# Patient Record
Sex: Male | Born: 1935 | Race: White | Hispanic: No | Smoking: Former smoker
Health system: Southern US, Community
[De-identification: ages and names within clinical notes are randomized; demographics above are authoritative.]

## PROBLEM LIST (undated history)

## (undated) DIAGNOSIS — M1990 Unspecified osteoarthritis, unspecified site: Secondary | ICD-10-CM

## (undated) DIAGNOSIS — F329 Major depressive disorder, single episode, unspecified: Secondary | ICD-10-CM

## (undated) DIAGNOSIS — T7840XA Allergy, unspecified, initial encounter: Secondary | ICD-10-CM

## (undated) DIAGNOSIS — N200 Calculus of kidney: Secondary | ICD-10-CM

## (undated) DIAGNOSIS — F419 Anxiety disorder, unspecified: Secondary | ICD-10-CM

## (undated) DIAGNOSIS — IMO0001 Reserved for inherently not codable concepts without codable children: Secondary | ICD-10-CM

## (undated) DIAGNOSIS — K219 Gastro-esophageal reflux disease without esophagitis: Secondary | ICD-10-CM

## (undated) DIAGNOSIS — F32A Depression, unspecified: Secondary | ICD-10-CM

## (undated) DIAGNOSIS — R42 Dizziness and giddiness: Secondary | ICD-10-CM

## (undated) HISTORY — DX: Allergy, unspecified, initial encounter: T78.40XA

## (undated) HISTORY — DX: Gastro-esophageal reflux disease without esophagitis: K21.9

## (undated) HISTORY — DX: Reserved for inherently not codable concepts without codable children: IMO0001

## (undated) HISTORY — DX: Depression, unspecified: F32.A

## (undated) HISTORY — PX: HERNIA REPAIR: SHX51

## (undated) HISTORY — DX: Calculus of kidney: N20.0

## (undated) HISTORY — DX: Major depressive disorder, single episode, unspecified: F32.9

## (undated) HISTORY — DX: Anxiety disorder, unspecified: F41.9

## (undated) HISTORY — DX: Unspecified osteoarthritis, unspecified site: M19.90

## (undated) HISTORY — PX: MASTOIDECTOMY: SHX711

---

## 2007-11-27 ENCOUNTER — Encounter: Admission: RE | Admit: 2007-11-27 | Discharge: 2007-11-27 | Payer: Self-pay | Admitting: Family Medicine

## 2008-01-09 ENCOUNTER — Encounter: Payer: Self-pay | Admitting: Family Medicine

## 2008-05-27 ENCOUNTER — Ambulatory Visit: Payer: Self-pay | Admitting: Family Medicine

## 2008-05-27 DIAGNOSIS — K219 Gastro-esophageal reflux disease without esophagitis: Secondary | ICD-10-CM | POA: Insufficient documentation

## 2008-05-27 DIAGNOSIS — Z87442 Personal history of urinary calculi: Secondary | ICD-10-CM | POA: Insufficient documentation

## 2008-05-27 DIAGNOSIS — J019 Acute sinusitis, unspecified: Secondary | ICD-10-CM

## 2008-05-27 DIAGNOSIS — F329 Major depressive disorder, single episode, unspecified: Secondary | ICD-10-CM

## 2008-05-27 DIAGNOSIS — M1990 Unspecified osteoarthritis, unspecified site: Secondary | ICD-10-CM | POA: Insufficient documentation

## 2008-05-27 DIAGNOSIS — F411 Generalized anxiety disorder: Secondary | ICD-10-CM

## 2008-06-01 ENCOUNTER — Ambulatory Visit: Payer: Self-pay | Admitting: Family Medicine

## 2008-06-24 ENCOUNTER — Telehealth: Payer: Self-pay | Admitting: Family Medicine

## 2009-01-05 ENCOUNTER — Ambulatory Visit: Payer: Self-pay | Admitting: Family Medicine

## 2009-01-17 ENCOUNTER — Telehealth: Payer: Self-pay | Admitting: Family Medicine

## 2009-02-16 ENCOUNTER — Ambulatory Visit: Payer: Self-pay | Admitting: Family Medicine

## 2009-02-16 DIAGNOSIS — N401 Enlarged prostate with lower urinary tract symptoms: Secondary | ICD-10-CM

## 2009-02-16 DIAGNOSIS — N138 Other obstructive and reflux uropathy: Secondary | ICD-10-CM

## 2009-02-16 DIAGNOSIS — R030 Elevated blood-pressure reading, without diagnosis of hypertension: Secondary | ICD-10-CM | POA: Insufficient documentation

## 2009-02-16 DIAGNOSIS — L299 Pruritus, unspecified: Secondary | ICD-10-CM

## 2009-02-16 DIAGNOSIS — R5383 Other fatigue: Secondary | ICD-10-CM

## 2009-02-16 DIAGNOSIS — R5381 Other malaise: Secondary | ICD-10-CM | POA: Insufficient documentation

## 2009-02-18 LAB — CONVERTED CEMR LAB
ALT: 22 units/L (ref 0–53)
AST: 28 units/L (ref 0–37)
BUN: 17 mg/dL (ref 6–23)
Basophils Relative: 0.8 % (ref 0.0–3.0)
Bilirubin, Direct: 0.3 mg/dL (ref 0.0–0.3)
Chloride: 106 meq/L (ref 96–112)
Eosinophils Relative: 2.7 % (ref 0.0–5.0)
GFR calc non Af Amer: 60.86 mL/min (ref >60)
HCT: 44.3 % (ref 39.0–52.0)
HDL: 29.5 mg/dL — ABNORMAL LOW (ref >39.00)
Lymphs Abs: 1.6 10*3/uL (ref 0.7–4.0)
MCV: 97.9 fL (ref 78.0–100.0)
Monocytes Absolute: 0.5 10*3/uL (ref 0.1–1.0)
Monocytes Relative: 9.8 % (ref 3.0–12.0)
Platelets: 169 10*3/uL (ref 150.0–400.0)
Potassium: 4.9 meq/L (ref 3.5–5.1)
RBC: 4.52 M/uL (ref 4.22–5.81)
Sodium: 140 meq/L (ref 135–145)
TSH: 2.7 microintl units/mL (ref 0.35–5.50)
Total Bilirubin: 1.3 mg/dL — ABNORMAL HIGH (ref 0.3–1.2)
Total CHOL/HDL Ratio: 8
Total Protein: 7.7 g/dL (ref 6.0–8.3)
VLDL: 28.4 mg/dL (ref 0.0–40.0)
WBC: 5 10*3/uL (ref 4.5–10.5)

## 2009-07-04 ENCOUNTER — Ambulatory Visit: Payer: Self-pay | Admitting: Family Medicine

## 2009-07-04 DIAGNOSIS — H60399 Other infective otitis externa, unspecified ear: Secondary | ICD-10-CM | POA: Insufficient documentation

## 2009-07-12 ENCOUNTER — Telehealth: Payer: Self-pay | Admitting: Family Medicine

## 2009-10-31 ENCOUNTER — Ambulatory Visit: Payer: Self-pay | Admitting: Family Medicine

## 2009-10-31 DIAGNOSIS — R404 Transient alteration of awareness: Secondary | ICD-10-CM | POA: Insufficient documentation

## 2009-11-03 ENCOUNTER — Encounter: Payer: Self-pay | Admitting: Family Medicine

## 2009-11-10 ENCOUNTER — Encounter: Payer: Self-pay | Admitting: Family Medicine

## 2010-01-31 ENCOUNTER — Encounter: Payer: Self-pay | Admitting: Family Medicine

## 2010-05-09 NOTE — Assessment & Plan Note (Signed)
Summary: swimmer's ear//ccm   Vital Signs:  Patient profile:   77 year old male Weight:      162 pounds BP sitting:   108 / 60  (left arm) Cuff size:   regular  Vitals Entered By: Raechel Ache, RN (October 31, 2009 3:44 PM) CC: C/o fluid R ear   History of Present Illness: Here for 2 reasons. First for 2 days he has had irritation in the right ear canal again. No other URI symptoms. he cleans out his ears periodically with OTC ear wax removal kits. Also for the past 6 months he has had increasing problems with falling asleep too easily during the day. This often interrupts his attempts to read the paper or watch TV. It never occurs while driving. he sleeps well at night .   Allergies: No Known Drug Allergies  Past History:  Past Medical History: Reviewed history from 02/16/2009 and no changes required. Nephrolithiasis, hx of Osteoarthritis Chickenpox GERD Anxiety Depression Allergic rhinitis halux valgus deformity right great toe, sees Dr. Lajoyce Corners right inguinal hernia, wears a truss wears hearing aids sees Dr. Donzetta Starch for skin exams  Review of Systems  The patient denies anorexia, fever, weight loss, weight gain, vision loss, decreased hearing, hoarseness, chest pain, syncope, dyspnea on exertion, peripheral edema, prolonged cough, headaches, hemoptysis, abdominal pain, melena, hematochezia, severe indigestion/heartburn, hematuria, incontinence, genital sores, muscle weakness, suspicious skin lesions, transient blindness, difficulty walking, depression, unusual weight change, abnormal bleeding, enlarged lymph nodes, angioedema, breast masses, and testicular masses.    Physical Exam  General:  Well-developed,well-nourished,in no acute distress; alert,appropriate and cooperative throughout examination Head:  Normocephalic and atraumatic without obvious abnormalities. No apparent alopecia or balding. Eyes:  No corneal or conjunctival inflammation noted. EOMI. Perrla.  Funduscopic exam benign, without hemorrhages, exudates or papilledema. Vision grossly normal. Ears:  External ear exam shows no significant lesions or deformities.  Otoscopic examination reveals clear canals, tympanic membranes are intact bilaterally without bulging, retraction, inflammation or discharge. Hearing is grossly normal bilaterally. Nose:  External nasal examination shows no deformity or inflammation. Nasal mucosa are pink and moist without lesions or exudates. Mouth:  Oral mucosa and oropharynx without lesions or exudates.  Teeth in good repair. Neck:  No deformities, masses, or tenderness noted. Lungs:  Normal respiratory effort, chest expands symmetrically. Lungs are clear to auscultation, no crackles or wheezes. Heart:  Normal rate and regular rhythm. S1 and S2 normal without gallop, murmur, click, rub or other extra sounds. Neurologic:  alert & oriented X3, cranial nerves II-XII intact, strength normal in all extremities, and gait normal.   Skin:  Intact without suspicious lesions or rashes Psych:  Cognition and judgment appear intact. Alert and cooperative with normal attention span and concentration. No apparent delusions, illusions, hallucinations   Impression & Recommendations:  Problem # 1:  SOMNOLENCE (ICD-780.09)  Problem # 2:  OTITIS EXTERNA (ICD-380.10)  His updated medication list for this problem includes:    Cortisporin-tc 3.06-09-08-0.5 Mg/ml Susp (Neomycin-colist-hc-thonzonium) .Marland Kitchen... Apply 5 drops in ear q 6 hours as needed  Complete Medication List: 1)  Lorazepam 1mg  Mg Tabs (lorazepam)  .... Qhs as needed 2)  Astelin 137 Mcg/spray Soln (Azelastine hcl) .Marland Kitchen.. 1 spray each nostril twice daily 3)  Prilosec Otc 20 Mg Tbec (Omeprazole magnesium) .... One by mouth daily 4)  Celexa 20 Mg Tabs (Citalopram hydrobromide) .... Once daily 5)  Flonase 50 Mcg/act Susp (Fluticasone propionate) .... 2 sprays each nostril once daily as needed 6)  Cortisporin-tc 3.06-09-08-0.5  Mg/ml Susp (Neomycin-colist-hc-thonzonium) .... Apply 5 drops in ear q 6 hours as needed 7)  Provigil 100 Mg Tabs (Modafinil) .... Once daily  Patient Instructions: 1)  Try Provigil 100 mg each morning.  2)  Please schedule a follow-up appointment in 2 weeks.  Prescriptions: PROVIGIL 100 MG TABS (MODAFINIL) once daily  #30 x 0   Entered and Authorized by:   Nelwyn Salisbury MD   Signed by:   Nelwyn Salisbury MD on 10/31/2009   Method used:   Print then Give to Patient   RxID:   [id number] CORTISPORIN-TC 3.06-09-08-0.5 MG/ML SUSP (NEOMYCIN-COLIST-HC-THONZONIUM) apply 5 drops in ear q 6 hours as needed  #10 x 5   Entered and Authorized by:   Nelwyn Salisbury MD   Signed by:   Nelwyn Salisbury MD on 10/31/2009   Method used:   Electronically to        [facility]* (retail)       [address]       [place], [place]  [id number]       Ph: [phone]       Fax: [phone]   RxID:   [id number]

## 2010-05-09 NOTE — Consult Note (Signed)
Summary: [facility]  [facility]   Imported By: Maryln Gottron 02/02/2010 14:00:50  _____________________________________________________________________  External Attachment:    Type:   Image     Comment:   External Document

## 2010-05-09 NOTE — Progress Notes (Signed)
Summary: refill lorazepam  Phone Note From Pharmacy   Caller: [facility]* Call For: refill  Summary of Call: request refill for Lorazepam 1mg  qhs Initial call taken by: Duard Brady LPN,  July 12, 2009 8:52 AM  Follow-up for Phone Call        call in #30 with 5 rf Follow-up by: Nelwyn Salisbury MD,  July 12, 2009 10:48 AM    Prescriptions: LORAZEPAM 0.5 MG TABS (LORAZEPAM) as needed  #30 x 5   Entered by:   Duard Brady LPN   Authorized by:   Nelwyn Salisbury MD   Signed by:   Duard Brady LPN on 09/81/1914   Method used:   Historical   RxID:   [id number]  called to [facility] >[facility]  Appended Document: refill lorazepam returned call to [place] [facility] in [place] , lorazepam has been 1mg  qhsprn - ok to continue.  Appended Document: refill lorazepam    Clinical Lists Changes  Medications: Changed medication from LORAZEPAM 0.5 MG TABS (LORAZEPAM) as needed to * LORAZEPAM 1MG  MG TABS (LORAZEPAM) qhs as needed

## 2010-05-09 NOTE — Medication Information (Signed)
Summary: 2nd part of PA denied for Provigil  2nd part of PA denied for Provigil   Imported By: Maryln Gottron 11/14/2009 10:44:10  _____________________________________________________________________  External Attachment:    Type:   Image     Comment:   External Document

## 2010-05-09 NOTE — Assessment & Plan Note (Signed)
Summary: EAR DRAINAGE / PAIN  // RS   Vital Signs:  Patient profile:   77 year old male Weight:      162 pounds Temp:     97.8 degrees F oral BP sitting:   120 / 80  (left arm) Cuff size:   regular  Vitals Entered By: Raechel Ache, RN (July 04, 2009 1:28 PM) CC: C/o R ear draining x 1 week- little sore.   History of Present Illness: Here to look at his right ear which has been slightly painful and draining yellow fluid for one week. This started after he attempted to irrigate cerumen out of this ear. He has done this at home for some time, usually with success. He does wear bilateral aids.   Allergies (verified): No Known Drug Allergies  Past History:  Past Medical History: Reviewed history from 02/16/2009 and no changes required. Nephrolithiasis, hx of Osteoarthritis Chickenpox GERD Anxiety Depression Allergic rhinitis halux valgus deformity right great toe, sees Dr. Lajoyce Corners right inguinal hernia, wears a truss wears hearing aids sees Dr. Donzetta Starch for skin exams  Review of Systems  The patient denies anorexia, fever, weight loss, weight gain, vision loss, hoarseness, chest pain, syncope, dyspnea on exertion, peripheral edema, prolonged cough, headaches, hemoptysis, abdominal pain, melena, hematochezia, severe indigestion/heartburn, hematuria, incontinence, genital sores, muscle weakness, suspicious skin lesions, transient blindness, difficulty walking, depression, unusual weight change, abnormal bleeding, enlarged lymph nodes, angioedema, breast masses, and testicular masses.    Physical Exam  General:  Well-developed,well-nourished,in no acute distress; alert,appropriate and cooperative throughout examination Head:  Normocephalic and atraumatic without obvious abnormalities. No apparent alopecia or balding. Eyes:  No corneal or conjunctival inflammation noted. EOMI. Perrla. Funduscopic exam benign, without hemorrhages, exudates or papilledema. Vision grossly  normal. Ears:  the right canal is red and inflamed, yellow drainage is present. The canal is narrow and tortuous, so inspecting the TM is impossible.  Nose:  External nasal examination shows no deformity or inflammation. Nasal mucosa are pink and moist without lesions or exudates. Mouth:  Oral mucosa and oropharynx without lesions or exudates.  Teeth in good repair. Neck:  No deformities, masses, or tenderness noted.   Impression & Recommendations:  Problem # 1:  OTITIS EXTERNA (ICD-380.10)  His updated medication list for this problem includes:    Cortisporin-tc 3.06-09-08-0.5 Mg/ml Susp (Neomycin-colist-hc-thonzonium) .Marland Kitchen... Apply 5 drops in ear q 6 hours as needed  Complete Medication List: 1)  Lorazepam 0.5 Mg Tabs (Lorazepam) .... As needed 2)  Astelin 137 Mcg/spray Soln (Azelastine hcl) .Marland Kitchen.. 1 spray each nostril twice daily 3)  Prilosec Otc 20 Mg Tbec (Omeprazole magnesium) .... One by mouth daily 4)  Celexa 20 Mg Tabs (Citalopram hydrobromide) .... Once daily 5)  Flonase 50 Mcg/act Susp (Fluticasone propionate) .... 2 sprays each nostril once daily as needed 6)  Cortisporin-tc 3.06-09-08-0.5 Mg/ml Susp (Neomycin-colist-hc-thonzonium) .... Apply 5 drops in ear q 6 hours as needed  Patient Instructions: 1)  Please schedule a follow-up appointment as needed .  Prescriptions: CORTISPORIN-TC 3.06-09-08-0.5 MG/ML SUSP (NEOMYCIN-COLIST-HC-THONZONIUM) apply 5 drops in ear q 6 hours as needed  #10 x 2   Entered and Authorized by:   Nelwyn Salisbury MD   Signed by:   Nelwyn Salisbury MD on 07/04/2009   Method used:   Electronically to        [facility]* (retail)       [address]       [place], [place]  [id number]  Ph: [phone]       Fax: [phone]   RxID:   [id number]

## 2010-05-09 NOTE — Medication Information (Signed)
Summary: Provigil Approved  Provigil Approved   Imported By: Maryln Gottron 11/09/2009 09:28:10  _____________________________________________________________________  External Attachment:    Type:   Image     Comment:   External Document

## 2010-06-13 ENCOUNTER — Other Ambulatory Visit: Payer: Self-pay | Admitting: Family Medicine

## 2010-06-15 ENCOUNTER — Other Ambulatory Visit: Payer: Self-pay | Admitting: Family Medicine

## 2010-06-16 ENCOUNTER — Other Ambulatory Visit: Payer: Self-pay | Admitting: *Deleted

## 2010-06-16 MED ORDER — FLUTICASONE PROPIONATE 50 MCG/ACT NA SUSP: 2.0000 | Freq: Every day | NASAL | Status: DC

## 2010-06-27 ENCOUNTER — Encounter: Payer: Self-pay | Admitting: Family Medicine

## 2010-07-10 ENCOUNTER — Other Ambulatory Visit: Payer: Self-pay

## 2010-07-10 NOTE — Telephone Encounter (Signed)
rx faxed to [facility] 

## 2010-07-10 NOTE — Telephone Encounter (Signed)
Requesting refills lorazepam

## 2010-07-10 NOTE — Telephone Encounter (Signed)
Call in Lorazepam 1 mg qhs, #30 with 5 rf

## 2010-07-11 MED ORDER — LORAZEPAM 1 MG PO TABS: 1.0000 mg | ORAL_TABLET | Freq: Every evening | ORAL | Status: DC | PRN

## 2010-07-12 ENCOUNTER — Other Ambulatory Visit (INDEPENDENT_AMBULATORY_CARE_PROVIDER_SITE_OTHER): Payer: Medicare Other | Admitting: Family Medicine

## 2010-07-12 ENCOUNTER — Other Ambulatory Visit: Payer: Self-pay | Admitting: Family Medicine

## 2010-07-12 DIAGNOSIS — T887XXA Unspecified adverse effect of drug or medicament, initial encounter: Secondary | ICD-10-CM

## 2010-07-12 DIAGNOSIS — N390 Urinary tract infection, site not specified: Secondary | ICD-10-CM

## 2010-07-12 DIAGNOSIS — Z0000 Encounter for general adult medical examination without abnormal findings: Secondary | ICD-10-CM

## 2010-07-12 DIAGNOSIS — E039 Hypothyroidism, unspecified: Secondary | ICD-10-CM

## 2010-07-12 DIAGNOSIS — N419 Inflammatory disease of prostate, unspecified: Secondary | ICD-10-CM

## 2010-07-12 DIAGNOSIS — E785 Hyperlipidemia, unspecified: Secondary | ICD-10-CM

## 2010-07-12 DIAGNOSIS — I10 Essential (primary) hypertension: Secondary | ICD-10-CM

## 2010-07-12 DIAGNOSIS — D649 Anemia, unspecified: Secondary | ICD-10-CM

## 2010-07-12 LAB — CBC WITH DIFFERENTIAL/PLATELET
Basophils Absolute: 0 10*3/uL (ref 0.0–0.1)
Eosinophils Absolute: 0.1 10*3/uL (ref 0.0–0.7)
HCT: 40.9 % (ref 39.0–52.0)
Lymphs Abs: 1.9 10*3/uL (ref 0.7–4.0)
MCHC: 34.5 g/dL (ref 30.0–36.0)
MCV: 95.9 fl (ref 78.0–100.0)
Monocytes Absolute: 0.5 10*3/uL (ref 0.1–1.0)
Neutrophils Relative %: 53.1 % (ref 43.0–77.0)
Platelets: 176 10*3/uL (ref 150.0–400.0)
RDW: 13.4 % (ref 11.5–14.6)

## 2010-07-12 LAB — BASIC METABOLIC PANEL
BUN: 14 mg/dL (ref 6–23)
Chloride: 109 mEq/L (ref 96–112)
Creatinine, Ser: 1.1 mg/dL (ref 0.4–1.5)
Glucose, Bld: 81 mg/dL (ref 70–99)
Potassium: 4.1 mEq/L (ref 3.5–5.1)

## 2010-07-12 LAB — LIPID PANEL
Cholesterol: 199 mg/dL (ref 0–200)
HDL: 36 mg/dL — ABNORMAL LOW (ref >39.00)
LDL Cholesterol: 143 mg/dL — ABNORMAL HIGH (ref 0–99)
Triglycerides: 98 mg/dL (ref 0.0–149.0)
VLDL: 19.6 mg/dL (ref 0.0–40.0)

## 2010-07-12 LAB — POCT URINALYSIS DIPSTICK
Glucose, UA: NEGATIVE
Spec Grav, UA: 1.025
pH, UA: 5.5

## 2010-07-12 LAB — HEPATIC FUNCTION PANEL
ALT: 16 U/L (ref 0–53)
Total Bilirubin: 0.7 mg/dL (ref 0.3–1.2)

## 2010-07-12 LAB — PSA: PSA: 3.4 ng/mL (ref 0.10–4.00)

## 2010-07-17 ENCOUNTER — Telehealth: Payer: Self-pay

## 2010-07-17 NOTE — Telephone Encounter (Signed)
Pt aware.

## 2010-07-17 NOTE — Telephone Encounter (Signed)
Message copied by Madison Hickman on Mon Jul 17, 2010 11:11 AM ------      Message from: Dwaine Deter      Created: Thu Jul 13, 2010  1:26 PM       Normal except mildly high chol. Watch the diet

## 2010-07-19 ENCOUNTER — Ambulatory Visit: Payer: Self-pay | Admitting: Family Medicine

## 2010-07-24 ENCOUNTER — Encounter: Payer: Self-pay | Admitting: Family Medicine

## 2010-07-24 ENCOUNTER — Ambulatory Visit (INDEPENDENT_AMBULATORY_CARE_PROVIDER_SITE_OTHER): Payer: Medicare Other | Admitting: Family Medicine

## 2010-07-24 VITALS — BP 124/80 | HR 70 | Ht 65.0 in | Wt 162.0 lb

## 2010-07-24 DIAGNOSIS — R5381 Other malaise: Secondary | ICD-10-CM

## 2010-07-24 DIAGNOSIS — R531 Weakness: Secondary | ICD-10-CM

## 2010-07-24 DIAGNOSIS — M129 Arthropathy, unspecified: Secondary | ICD-10-CM

## 2010-07-24 DIAGNOSIS — M1990 Unspecified osteoarthritis, unspecified site: Secondary | ICD-10-CM

## 2010-07-24 DIAGNOSIS — F329 Major depressive disorder, single episode, unspecified: Secondary | ICD-10-CM

## 2010-07-24 DIAGNOSIS — K4090 Unilateral inguinal hernia, without obstruction or gangrene, not specified as recurrent: Secondary | ICD-10-CM

## 2010-07-24 MED ORDER — CITALOPRAM HYDROBROMIDE 20 MG PO TABS: 10.0000 mg | ORAL_TABLET | Freq: Every day | ORAL | Status: DC

## 2010-07-24 NOTE — Progress Notes (Signed)
  Subjective:    Patient ID: Allen Wheeler, male    DOB: 15-Jun-1919, 77 y.o.   MRN: [id number]  HPI 77 yr old male for a cpx. He has a few minor complaints but is doing well all in all. He has had some fasciitis in the left foot and heel, but this is better with stretches and wearing arch supports. He has a persistent right groin hernia, but he wears a truss every day and this works well. He has very little pain with it. He has arthritis in his hands but he can deal with this also. He denies any chest pains or SOB. He is active and works in his garden daily. He does report a lot of sleepiness during the day, and he often has to take naps. He wonders if this is age related or a side effect of his Celexa.    Review of Systems  Constitutional: Negative.   HENT: Negative.   Eyes: Negative.   Respiratory: Negative.   Cardiovascular: Negative.   Gastrointestinal: Negative.   Genitourinary: Negative.   Musculoskeletal: Positive for arthralgias. Negative for myalgias, back pain and gait problem.  Skin: Negative.   Neurological: Negative.   Hematological: Negative.   Psychiatric/Behavioral: Negative.        Objective:   Physical Exam  Constitutional: He is oriented to person, place, and time. He appears well-developed and well-nourished. No distress.  HENT:  Head: Normocephalic and atraumatic.  Right Ear: External ear normal.  Left Ear: External ear normal.  Nose: Nose normal.  Mouth/Throat: Oropharynx is clear and moist. No oropharyngeal exudate.  Eyes: Conjunctivae and EOM are normal. Pupils are equal, round, and reactive to light. Right eye exhibits no discharge. Left eye exhibits no discharge. No scleral icterus.  Neck: Neck supple. No JVD present. No tracheal deviation present. No thyromegaly present.  Cardiovascular: Normal rate, regular rhythm, normal heart sounds and intact distal pulses.  Exam reveals no gallop and no friction rub.   No murmur heard.      EKG normal    Pulmonary/Chest: Effort normal and breath sounds normal. No respiratory distress. He has no wheezes. He has no rales. He exhibits no tenderness.  Abdominal: Soft. Bowel sounds are normal. He exhibits no distension and no mass. There is no tenderness. There is no rebound and no guarding.  Genitourinary: Rectum normal, prostate normal and penis normal. Guaiac negative stool. No penile tenderness.       He has a small reducible non-tender right direct inguinal hernia   Musculoskeletal: Normal range of motion. He exhibits no edema and no tenderness.  Lymphadenopathy:    He has no cervical adenopathy.  Neurological: He is alert and oriented to person, place, and time. He has normal reflexes. No cranial nerve deficit. He exhibits normal muscle tone. Coordination normal.  Skin: Skin is warm and dry. No rash noted. He is not diaphoretic. No erythema. No pallor.  Psychiatric: He has a normal mood and affect. His behavior is normal. Judgment and thought content normal.          Assessment & Plan:  He seems to be doing well. I suggested he take one half of a Celexa daily.

## 2010-11-15 ENCOUNTER — Other Ambulatory Visit: Payer: Self-pay | Admitting: Family Medicine

## 2010-11-15 NOTE — Telephone Encounter (Signed)
Last filled 10/13/10. Last OV 07/24/10

## 2010-11-16 NOTE — Telephone Encounter (Signed)
Call in #30 with 11 rf 

## 2010-11-17 NOTE — Telephone Encounter (Signed)
rx sent into pharmacy

## 2011-01-01 ENCOUNTER — Telehealth: Payer: Self-pay | Admitting: Family Medicine

## 2011-01-01 NOTE — Telephone Encounter (Signed)
Refill request for Lorazepam 1 mg take 1 po qhs prn. Pt last here on 07/24/10 and script last filled on 12/04/10.

## 2011-01-01 NOTE — Telephone Encounter (Signed)
Call in #30 with 5 rf 

## 2011-01-01 NOTE — Telephone Encounter (Signed)
Refill request for Ativan 1 mg take 1 po qhs prn and pt last here on 07/24/10.

## 2011-01-02 ENCOUNTER — Telehealth: Payer: Self-pay | Admitting: Family Medicine

## 2011-01-02 MED ORDER — FLUTICASONE PROPIONATE 50 MCG/ACT NA SUSP: 2.0000 | Freq: Every day | NASAL | Status: DC

## 2011-01-02 MED ORDER — LORAZEPAM 1 MG PO TABS: 1.0000 mg | ORAL_TABLET | Freq: Every evening | ORAL | Status: DC | PRN

## 2011-01-02 NOTE — Telephone Encounter (Signed)
Script sent e-scribe 

## 2011-01-02 NOTE — Telephone Encounter (Signed)
Script called in

## 2011-01-04 NOTE — Telephone Encounter (Signed)
Pt is checking on status of ativan. Pt is aware waiting on MD

## 2011-01-05 NOTE — Telephone Encounter (Signed)
We already did this on 01-02-11

## 2011-02-22 ENCOUNTER — Ambulatory Visit (INDEPENDENT_AMBULATORY_CARE_PROVIDER_SITE_OTHER): Payer: Medicare Other | Admitting: Family Medicine

## 2011-02-22 DIAGNOSIS — Z23 Encounter for immunization: Secondary | ICD-10-CM

## 2011-05-16 ENCOUNTER — Other Ambulatory Visit: Payer: Self-pay | Admitting: Family Medicine

## 2011-06-12 DIAGNOSIS — H7200 Central perforation of tympanic membrane, unspecified ear: Secondary | ICD-10-CM | POA: Diagnosis not present

## 2011-06-12 DIAGNOSIS — H905 Unspecified sensorineural hearing loss: Secondary | ICD-10-CM | POA: Diagnosis not present

## 2011-06-12 DIAGNOSIS — H6120 Impacted cerumen, unspecified ear: Secondary | ICD-10-CM | POA: Diagnosis not present

## 2011-06-29 ENCOUNTER — Other Ambulatory Visit: Payer: Self-pay | Admitting: Family Medicine

## 2011-07-04 ENCOUNTER — Telehealth: Payer: Self-pay | Admitting: Family Medicine

## 2011-07-04 MED ORDER — OMEPRAZOLE MAGNESIUM 20 MG PO TBEC: 20.0000 mg | DELAYED_RELEASE_TABLET | Freq: Every day | ORAL | Status: DC

## 2011-07-04 MED ORDER — LORAZEPAM 1 MG PO TABS: 1.0000 mg | ORAL_TABLET | Freq: Every evening | ORAL | Status: DC | PRN

## 2011-07-04 NOTE — Telephone Encounter (Signed)
Pt called req refill of LORazepam (ATIVAN) 1 MG tablet and PRILOSEC OTC 20 MG tablet   to [facility]. Pt is leaving to go out of town on Saturday and is req these meds to be called in before end of wk.

## 2011-07-04 NOTE — Telephone Encounter (Signed)
SCRIPTS PRINTED TO BE FAXED

## 2011-07-04 NOTE — Telephone Encounter (Signed)
Call in Lorazepam for 6 months and Prilosec for one year

## 2011-07-05 ENCOUNTER — Other Ambulatory Visit: Payer: Self-pay | Admitting: Family Medicine

## 2011-07-13 ENCOUNTER — Telehealth: Payer: Self-pay | Admitting: Family Medicine

## 2011-07-13 NOTE — Telephone Encounter (Signed)
Pt called and has sch cpx labs for 5/3 and cpx for 5/20. Pt is req to get a hemmocult test mailed to his home prior to having cpx labs done.

## 2011-07-15 NOTE — Telephone Encounter (Signed)
Please mail him a 3 card stool kit

## 2011-07-17 NOTE — Telephone Encounter (Signed)
hemacult card mailed to home

## 2011-07-30 ENCOUNTER — Telehealth: Payer: Self-pay | Admitting: Family Medicine

## 2011-07-30 NOTE — Telephone Encounter (Signed)
Spoke with pt

## 2011-07-30 NOTE — Telephone Encounter (Signed)
He mailed in 3 stool cards he obtained form home. All three are negative on heme testing. Please let him know

## 2011-08-10 ENCOUNTER — Other Ambulatory Visit: Payer: BC Managed Care – PPO

## 2011-08-13 ENCOUNTER — Other Ambulatory Visit (INDEPENDENT_AMBULATORY_CARE_PROVIDER_SITE_OTHER): Payer: Medicare Other

## 2011-08-13 DIAGNOSIS — D649 Anemia, unspecified: Secondary | ICD-10-CM

## 2011-08-13 DIAGNOSIS — E785 Hyperlipidemia, unspecified: Secondary | ICD-10-CM | POA: Diagnosis not present

## 2011-08-13 DIAGNOSIS — N400 Enlarged prostate without lower urinary tract symptoms: Secondary | ICD-10-CM | POA: Diagnosis not present

## 2011-08-13 DIAGNOSIS — T887XXA Unspecified adverse effect of drug or medicament, initial encounter: Secondary | ICD-10-CM | POA: Diagnosis not present

## 2011-08-13 DIAGNOSIS — I10 Essential (primary) hypertension: Secondary | ICD-10-CM | POA: Diagnosis not present

## 2011-08-13 DIAGNOSIS — E039 Hypothyroidism, unspecified: Secondary | ICD-10-CM

## 2011-08-13 DIAGNOSIS — Z0000 Encounter for general adult medical examination without abnormal findings: Secondary | ICD-10-CM

## 2011-08-13 LAB — POCT URINALYSIS DIPSTICK
Leukocytes, UA: NEGATIVE
Nitrite, UA: NEGATIVE
pH, UA: 5.5

## 2011-08-13 LAB — CBC WITH DIFFERENTIAL/PLATELET
Basophils Relative: 0.6 % (ref 0.0–3.0)
Eosinophils Absolute: 0.1 10*3/uL (ref 0.0–0.7)
Hemoglobin: 14.1 g/dL (ref 13.0–17.0)
MCHC: 33.4 g/dL (ref 30.0–36.0)
MCV: 96.1 fl (ref 78.0–100.0)
Monocytes Absolute: 0.5 10*3/uL (ref 0.1–1.0)
Neutro Abs: 2.8 10*3/uL (ref 1.4–7.7)
Neutrophils Relative %: 55.7 % (ref 43.0–77.0)
RBC: 4.4 Mil/uL (ref 4.22–5.81)

## 2011-08-13 LAB — LIPID PANEL: VLDL: 16.8 mg/dL (ref 0.0–40.0)

## 2011-08-13 LAB — HEPATIC FUNCTION PANEL
Albumin: 3.5 g/dL (ref 3.5–5.2)
Total Protein: 6.7 g/dL (ref 6.0–8.3)

## 2011-08-13 LAB — PSA: PSA: 3.72 ng/mL (ref 0.10–4.00)

## 2011-08-13 LAB — BASIC METABOLIC PANEL
CO2: 23 mEq/L (ref 19–32)
Chloride: 108 mEq/L (ref 96–112)
Creatinine, Ser: 1.2 mg/dL (ref 0.4–1.5)

## 2011-08-13 LAB — TSH: TSH: 3.25 u[IU]/mL (ref 0.35–5.50)

## 2011-08-17 NOTE — Progress Notes (Signed)
Quick Note:  Spoke with pt ______ 

## 2011-08-27 ENCOUNTER — Encounter: Payer: Self-pay | Admitting: Family Medicine

## 2011-08-27 ENCOUNTER — Ambulatory Visit (INDEPENDENT_AMBULATORY_CARE_PROVIDER_SITE_OTHER): Payer: Medicare Other | Admitting: Family Medicine

## 2011-08-27 VITALS — BP 122/78 | HR 78 | Temp 98.0°F | Ht 65.0 in | Wt 159.0 lb

## 2011-08-27 DIAGNOSIS — L408 Other psoriasis: Secondary | ICD-10-CM

## 2011-08-27 DIAGNOSIS — R404 Transient alteration of awareness: Secondary | ICD-10-CM | POA: Diagnosis not present

## 2011-08-27 DIAGNOSIS — R400 Somnolence: Secondary | ICD-10-CM

## 2011-08-27 DIAGNOSIS — L409 Psoriasis, unspecified: Secondary | ICD-10-CM

## 2011-08-27 DIAGNOSIS — K4090 Unilateral inguinal hernia, without obstruction or gangrene, not specified as recurrent: Secondary | ICD-10-CM

## 2011-08-27 DIAGNOSIS — K219 Gastro-esophageal reflux disease without esophagitis: Secondary | ICD-10-CM

## 2011-08-27 DIAGNOSIS — I10 Essential (primary) hypertension: Secondary | ICD-10-CM

## 2011-08-27 MED ORDER — CITALOPRAM HYDROBROMIDE 20 MG PO TABS: 10.0000 mg | ORAL_TABLET | Freq: Every day | ORAL | Status: DC

## 2011-08-27 MED ORDER — OMEPRAZOLE MAGNESIUM 20 MG PO TBEC: 20.0000 mg | DELAYED_RELEASE_TABLET | Freq: Two times a day (BID) | ORAL | Status: DC

## 2011-08-27 MED ORDER — CLOBETASOL PROPIONATE 0.05 % EX OINT: TOPICAL_OINTMENT | Freq: Every day | CUTANEOUS | Status: AC

## 2011-08-27 NOTE — Progress Notes (Signed)
Subjective:    Patient ID: Allen Wheeler, male    DOB: 1919/05/24, 78 y.o.   MRN: [id number]  HPI 78 yr old male for a cpx. He has a few issues to discuss. His wife died several months ago, and this has been difficult for him. He seems to be managing fairly well. He remains active and socializes frequently with friends. He has had an inguinal hernis for several years which does not really bother him. He often reduces it himself, and it is not painful. He has scalp psoriasis with a lot of flaking and some itching. He has tried a number of OTC shampoos with mixed results. He is often sleepy and finds it hard to read a book or watch TV without falling asleep. He thinks this is due to the Celexa he has been on for some years. He sleeps well with a single Lorazepam every night. Appetite is good. He was started on Omeprazole for a dry cough that has been going on for several years, and this helps. However some days the cough is worse. No SOB. No heartburn symptoms.    Review of Systems  Constitutional: Negative.   HENT: Negative.   Eyes: Negative.   Respiratory: Positive for cough and chest tightness. Negative for choking, shortness of breath and wheezing.   Cardiovascular: Negative.   Gastrointestinal: Negative.   Genitourinary: Negative.   Musculoskeletal: Positive for arthralgias. Negative for myalgias, back pain, joint swelling and gait problem.  Skin: Negative for color change, pallor, rash and wound.  Neurological: Negative.   Hematological: Negative.   Psychiatric/Behavioral: Negative.        Objective:   Physical Exam  Constitutional: He is oriented to person, place, and time. He appears well-developed and well-nourished. No distress.  HENT:  Head: Normocephalic and atraumatic.  Right Ear: External ear normal.  Left Ear: External ear normal.  Nose: Nose normal.  Mouth/Throat: Oropharynx is clear and moist. No oropharyngeal exudate.  Eyes: Conjunctivae and EOM are normal. Pupils  are equal, round, and reactive to light. Right eye exhibits no discharge. Left eye exhibits no discharge. No scleral icterus.  Neck: Neck supple. No JVD present. No tracheal deviation present. No thyromegaly present.  Cardiovascular: Normal rate, regular rhythm, normal heart sounds and intact distal pulses.  Exam reveals no gallop and no friction rub.   No murmur heard.      EKG normal   Pulmonary/Chest: Effort normal and breath sounds normal. No respiratory distress. He has no wheezes. He has no rales. He exhibits no tenderness.  Abdominal: Soft. Bowel sounds are normal. He exhibits mass. He exhibits no distension. There is no tenderness. There is no rebound and no guarding.       Small reducible non-tender right direct inguinal hernia  Genitourinary: Rectum normal, prostate normal and penis normal. Guaiac negative stool. No penile tenderness.  Musculoskeletal: Normal range of motion. He exhibits no edema and no tenderness.  Lymphadenopathy:    He has no cervical adenopathy.  Neurological: He is alert and oriented to person, place, and time. He has normal reflexes. No cranial nerve deficit. He exhibits normal muscle tone. Coordination normal.  Skin: Skin is warm and dry. No rash noted. He is not diaphoretic. No erythema. No pallor.       Flaking skin over the entire scalp  Psychiatric: He has a normal mood and affect. His behavior is normal. Judgment and thought content normal.          Assessment & Plan:  His  cough is probably from silent reflux, so he will increase the Omeprazole to bid. We will monitor the hernia for now, and he will follow up if it starts to bother him. Try Clobetasol lotion to the scalp 2-3 times a week. His drowsiness may be from the Celexa, so he will decrease this to 10 mg a day. Recheck in 3 months.

## 2011-08-28 ENCOUNTER — Encounter: Payer: Self-pay | Admitting: Family Medicine

## 2011-09-04 ENCOUNTER — Other Ambulatory Visit: Payer: Self-pay | Admitting: Family Medicine

## 2011-09-13 DIAGNOSIS — J310 Chronic rhinitis: Secondary | ICD-10-CM | POA: Diagnosis not present

## 2011-09-13 DIAGNOSIS — H6120 Impacted cerumen, unspecified ear: Secondary | ICD-10-CM | POA: Diagnosis not present

## 2011-09-13 DIAGNOSIS — B369 Superficial mycosis, unspecified: Secondary | ICD-10-CM | POA: Diagnosis not present

## 2011-09-13 DIAGNOSIS — H7200 Central perforation of tympanic membrane, unspecified ear: Secondary | ICD-10-CM | POA: Diagnosis not present

## 2011-09-13 DIAGNOSIS — H905 Unspecified sensorineural hearing loss: Secondary | ICD-10-CM | POA: Diagnosis not present

## 2011-10-23 ENCOUNTER — Other Ambulatory Visit: Payer: Self-pay | Admitting: Family Medicine

## 2011-11-05 ENCOUNTER — Other Ambulatory Visit: Payer: Self-pay | Admitting: Family Medicine

## 2011-11-07 NOTE — Telephone Encounter (Signed)
Call in a one year supply  

## 2011-12-22 ENCOUNTER — Other Ambulatory Visit: Payer: Self-pay | Admitting: Family Medicine

## 2011-12-25 NOTE — Telephone Encounter (Signed)
Call in #30 with 5 rf 

## 2011-12-25 NOTE — Telephone Encounter (Signed)
Rx last filled 07/04/11 #30x5rf. Pt last seen 08/27/11. Pls advise.

## 2011-12-26 NOTE — Telephone Encounter (Signed)
Call in #30 with 5 rf 

## 2011-12-27 MED ORDER — LORAZEPAM 1 MG PO TABS: 1.0000 mg | ORAL_TABLET | Freq: Every evening | ORAL | Status: DC | PRN

## 2011-12-27 NOTE — Addendum Note (Signed)
Addended by: Alfred Levins D on: 12/27/2011 04:00 PM   Modules accepted: Orders

## 2011-12-27 NOTE — Telephone Encounter (Signed)
rx called into pharmacy

## 2012-02-05 DIAGNOSIS — H7200 Central perforation of tympanic membrane, unspecified ear: Secondary | ICD-10-CM | POA: Diagnosis not present

## 2012-02-05 DIAGNOSIS — H905 Unspecified sensorineural hearing loss: Secondary | ICD-10-CM | POA: Diagnosis not present

## 2012-02-05 DIAGNOSIS — H6120 Impacted cerumen, unspecified ear: Secondary | ICD-10-CM | POA: Diagnosis not present

## 2012-04-21 ENCOUNTER — Telehealth: Payer: Self-pay | Admitting: Family Medicine

## 2012-04-21 MED ORDER — AZELASTINE HCL 0.1 % NA SOLN: 2.0000 | Freq: Two times a day (BID) | NASAL | Status: DC

## 2012-04-21 NOTE — Telephone Encounter (Signed)
Refill request for Astelin dose was increased to 2 sprays in each nostril bid. Per Dr. Clent Ridges okay to change and I did send new script e-scribe.

## 2012-05-06 DIAGNOSIS — H7200 Central perforation of tympanic membrane, unspecified ear: Secondary | ICD-10-CM | POA: Diagnosis not present

## 2012-05-06 DIAGNOSIS — B369 Superficial mycosis, unspecified: Secondary | ICD-10-CM | POA: Diagnosis not present

## 2012-05-06 DIAGNOSIS — H6240 Otitis externa in other diseases classified elsewhere, unspecified ear: Secondary | ICD-10-CM | POA: Diagnosis not present

## 2012-05-06 DIAGNOSIS — H905 Unspecified sensorineural hearing loss: Secondary | ICD-10-CM | POA: Diagnosis not present

## 2012-05-16 DIAGNOSIS — H26019 Infantile and juvenile cortical, lamellar, or zonular cataract, unspecified eye: Secondary | ICD-10-CM | POA: Diagnosis not present

## 2012-05-16 DIAGNOSIS — H2510 Age-related nuclear cataract, unspecified eye: Secondary | ICD-10-CM | POA: Diagnosis not present

## 2012-06-08 ENCOUNTER — Other Ambulatory Visit: Payer: Self-pay | Admitting: Family Medicine

## 2012-06-17 ENCOUNTER — Telehealth: Payer: Self-pay | Admitting: Family Medicine

## 2012-06-17 MED ORDER — LORAZEPAM 1 MG PO TABS: 1.0000 mg | ORAL_TABLET | Freq: Every evening | ORAL | Status: DC | PRN

## 2012-06-17 NOTE — Telephone Encounter (Signed)
Okay for 6 months 

## 2012-06-17 NOTE — Telephone Encounter (Signed)
I called in script 

## 2012-06-17 NOTE — Telephone Encounter (Signed)
Refill request for Lorazepam 1 mg take 1 po qhs prn

## 2012-07-22 ENCOUNTER — Telehealth: Payer: Self-pay | Admitting: Family Medicine

## 2012-07-22 MED ORDER — OMEPRAZOLE MAGNESIUM 20 MG PO TBEC: 20.0000 mg | DELAYED_RELEASE_TABLET | Freq: Every day | ORAL | Status: DC

## 2012-07-22 NOTE — Telephone Encounter (Signed)
Refill request for Prilosec OTC 20 mg take 1 po qhs # 42 and send to [facility], which I did send e-scribe.

## 2012-08-13 DIAGNOSIS — K1370 Unspecified lesions of oral mucosa: Secondary | ICD-10-CM | POA: Diagnosis not present

## 2012-08-27 ENCOUNTER — Other Ambulatory Visit (INDEPENDENT_AMBULATORY_CARE_PROVIDER_SITE_OTHER): Payer: Medicare Other

## 2012-08-27 DIAGNOSIS — I10 Essential (primary) hypertension: Secondary | ICD-10-CM

## 2012-08-27 DIAGNOSIS — N401 Enlarged prostate with lower urinary tract symptoms: Secondary | ICD-10-CM | POA: Diagnosis not present

## 2012-08-27 LAB — BASIC METABOLIC PANEL
CO2: 21 mEq/L (ref 19–32)
Calcium: 8.5 mg/dL (ref 8.4–10.5)
Chloride: 110 mEq/L (ref 96–112)
Glucose, Bld: 83 mg/dL (ref 70–99)
Sodium: 139 mEq/L (ref 135–145)

## 2012-08-27 LAB — POCT URINALYSIS DIPSTICK
Blood, UA: NEGATIVE
Glucose, UA: NEGATIVE
Ketones, UA: NEGATIVE
Nitrite, UA: NEGATIVE
pH, UA: 6

## 2012-08-27 LAB — LIPID PANEL
Cholesterol: 218 mg/dL — ABNORMAL HIGH (ref 0–200)
Total CHOL/HDL Ratio: 7
Triglycerides: 124 mg/dL (ref 0.0–149.0)

## 2012-08-27 LAB — CBC WITH DIFFERENTIAL/PLATELET
Basophils Absolute: 0 10*3/uL (ref 0.0–0.1)
Basophils Relative: 0.4 % (ref 0.0–3.0)
Eosinophils Absolute: 0.2 10*3/uL (ref 0.0–0.7)
Hemoglobin: 14.2 g/dL (ref 13.0–17.0)
Lymphocytes Relative: 31.6 % (ref 12.0–46.0)
Lymphs Abs: 1.8 10*3/uL (ref 0.7–4.0)
MCHC: 33.9 g/dL (ref 30.0–36.0)
MCV: 95.9 fl (ref 78.0–100.0)
Monocytes Absolute: 0.6 10*3/uL (ref 0.1–1.0)
Neutro Abs: 3.2 10*3/uL (ref 1.4–7.7)
RBC: 4.36 Mil/uL (ref 4.22–5.81)
RDW: 13.5 % (ref 11.5–14.6)

## 2012-08-27 LAB — HEPATIC FUNCTION PANEL
Albumin: 3.3 g/dL — ABNORMAL LOW (ref 3.5–5.2)
Alkaline Phosphatase: 55 U/L (ref 39–117)
Total Protein: 6.4 g/dL (ref 6.0–8.3)

## 2012-08-27 LAB — PSA: PSA: 3.02 ng/mL (ref 0.10–4.00)

## 2012-08-27 LAB — TSH: TSH: 2.56 u[IU]/mL (ref 0.35–5.50)

## 2012-09-02 ENCOUNTER — Encounter: Payer: Self-pay | Admitting: Family Medicine

## 2012-09-02 ENCOUNTER — Ambulatory Visit (INDEPENDENT_AMBULATORY_CARE_PROVIDER_SITE_OTHER): Payer: Medicare Other | Admitting: Family Medicine

## 2012-09-02 VITALS — BP 124/70 | HR 61 | Temp 97.3°F | Ht 65.25 in | Wt 168.0 lb

## 2012-09-02 DIAGNOSIS — F329 Major depressive disorder, single episode, unspecified: Secondary | ICD-10-CM

## 2012-09-02 DIAGNOSIS — M1990 Unspecified osteoarthritis, unspecified site: Secondary | ICD-10-CM

## 2012-09-02 DIAGNOSIS — R9431 Abnormal electrocardiogram [ECG] [EKG]: Secondary | ICD-10-CM | POA: Diagnosis not present

## 2012-09-02 DIAGNOSIS — K219 Gastro-esophageal reflux disease without esophagitis: Secondary | ICD-10-CM | POA: Diagnosis not present

## 2012-09-02 DIAGNOSIS — R030 Elevated blood-pressure reading, without diagnosis of hypertension: Secondary | ICD-10-CM

## 2012-09-02 MED ORDER — AZELASTINE HCL 0.1 % NA SOLN: 2.0000 | Freq: Two times a day (BID) | NASAL | Status: DC

## 2012-09-02 MED ORDER — OMEPRAZOLE MAGNESIUM 20 MG PO TBEC: 20.0000 mg | DELAYED_RELEASE_TABLET | Freq: Every day | ORAL | Status: DC

## 2012-09-02 NOTE — Progress Notes (Signed)
  Subjective:    Patient ID: Allen Wheeler, male    DOB: Mar 02, 1920, 79 y.o.   MRN: [id number]  HPI 79 yr old male for a cpx. He feels well in general but does have some arthritis pains here and there. He is active and still travels quite a bit. He went to [place] last fall.    Review of Systems  Constitutional: Negative.   HENT: Negative.   Eyes: Negative.   Respiratory: Negative.   Cardiovascular: Negative.   Gastrointestinal: Negative.   Genitourinary: Negative.   Musculoskeletal: Negative.   Skin: Negative.   Neurological: Negative.   Psychiatric/Behavioral: Negative.        Objective:   Physical Exam  Constitutional: He is oriented to person, place, and time. He appears well-developed and well-nourished. No distress.  HENT:  Head: Normocephalic and atraumatic.  Right Ear: External ear normal.  Left Ear: External ear normal.  Nose: Nose normal.  Mouth/Throat: Oropharynx is clear and moist. No oropharyngeal exudate.  Eyes: Conjunctivae and EOM are normal. Pupils are equal, round, and reactive to light. Right eye exhibits no discharge. Left eye exhibits no discharge. No scleral icterus.  Neck: Neck supple. No JVD present. No tracheal deviation present. No thyromegaly present.  Cardiovascular: Normal rate, regular rhythm, normal heart sounds and intact distal pulses.  Exam reveals no gallop and no friction rub.   No murmur heard. EKG shows sinus rhythm but also some interventricular conduction delay with wide QRS forms. This is new compared to a year ago.   Pulmonary/Chest: Effort normal and breath sounds normal. No respiratory distress. He has no wheezes. He has no rales. He exhibits no tenderness.  Abdominal: Soft. Bowel sounds are normal. He exhibits no distension and no mass. There is no tenderness. There is no rebound and no guarding.  Genitourinary: Rectum normal, prostate normal and penis normal. Guaiac negative stool. No penile tenderness.  Musculoskeletal: Normal  range of motion. He exhibits no edema and no tenderness.  Lymphadenopathy:    He has no cervical adenopathy.  Neurological: He is alert and oriented to person, place, and time. He has normal reflexes. No cranial nerve deficit. He exhibits normal muscle tone. Coordination normal.  Skin: Skin is warm and dry. No rash noted. He is not diaphoretic. No erythema. No pallor.  Psychiatric: He has a normal mood and affect. His behavior is normal. Judgment and thought content normal.          Assessment & Plan:  Well exam. He seems to be doing well but I would like for him to see Cardiology about the new EKG changes.

## 2012-09-02 NOTE — Progress Notes (Signed)
Quick Note:  Pt has appointment on 09/02/12 will go over then. ______

## 2012-09-03 ENCOUNTER — Encounter: Payer: Self-pay | Admitting: Cardiology

## 2012-09-03 ENCOUNTER — Ambulatory Visit (INDEPENDENT_AMBULATORY_CARE_PROVIDER_SITE_OTHER): Payer: Medicare Other | Admitting: Cardiology

## 2012-09-03 VITALS — BP 128/80 | HR 68 | Wt 168.0 lb

## 2012-09-03 DIAGNOSIS — R9431 Abnormal electrocardiogram [ECG] [EKG]: Secondary | ICD-10-CM

## 2012-09-03 NOTE — Assessment & Plan Note (Signed)
Patient is noted to have a left bundle branch block on electrocardiogram. He has mild dyspnea. He is remarkably well-preserved and active. Plan adenosine Myoview for risk stratification. If normal no further evaluation indicated.

## 2012-09-03 NOTE — Patient Instructions (Addendum)
Your physician recommends that you schedule a follow-up appointment in: AS NEEDED PENDING TEST RESULTS  Your physician has requested that you have an adenosine myoview. For further information please visit [email or web address] Please follow instruction sheet, as given.

## 2012-09-03 NOTE — Progress Notes (Signed)
  HPI: 79 year old male with no prior cardiac history for evaluation of abnormal electrocardiogram. Recently noted to have a left bundle branch block and cardiology asked to evaluate. He notes dyspnea with more extreme activities but not routine activities. No orthopnea, PND, pedal edema, palpitations, syncope or chest pain.  Current Outpatient Prescriptions  Medication Sig Dispense Refill  . azelastine (ASTELIN) 137 MCG/SPRAY nasal spray Place 2 sprays into the nose 2 (two) times daily. Use in each nostril as directed  60 mL  11  . CELEXA 20 MG tablet TAKE 1 TABLET ONCE DAILY.  30 each  11  . FLONASE 50 MCG/ACT nasal spray USE 2 SPRAYS IN EACH NOSTRIL ONCE DAILY  16 g  11  . LORazepam (ATIVAN) 1 MG tablet Take 1 tablet (1 mg total) by mouth at bedtime as needed.  30 tablet  5  . omeprazole (PRILOSEC OTC) 20 MG tablet Take 1 tablet (20 mg total) by mouth at bedtime.  90 tablet  3   No current facility-administered medications for this visit.    No Known Allergies  Past Medical History  Diagnosis Date  . Nephrolithiasis   . Osteoarthritis   . Chickenpox   . GERD (gastroesophageal reflux disease)   . Anxiety   . Depression   . Allergy   . Hearing loss     wears hearing aids    Past Surgical History  Procedure Laterality Date  . Hernia repair      wears truss  . Mastoidectomy      History   Social History  . Marital Status: Widowed    Spouse Name: N/A    Number of Children: 1  . 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 Smoking status: Former Games developer  . Smokeless tobacco: Never Used  . Alcohol Use: 3.5 oz/week    7 drink(s) per week  . Drug Use: No  . Sexually Active: Not on file   Other Topics Concern  . Not on file   Social History Narrative  . No narrative on file    Family History  Problem Relation Age of Onset  . Heart disease Mother     Pacemaker  . Colon cancer      ROS: no fevers or chills,  productive cough, hemoptysis, dysphasia, odynophagia, melena, hematochezia, dysuria, hematuria, rash, seizure activity, orthopnea, PND, pedal edema, claudication. Remaining systems are negative.  Physical Exam:   Blood pressure 128/80, pulse 68, weight 168 lb (76.204 kg).  General:  Well developed/well nourished in NAD Skin warm/dry Patient not depressed No peripheral clubbing Back-normal HEENT-normal/normal eyelids Neck supple/normal carotid upstroke bilaterally; no bruits; no JVD; no thyromegaly chest - CTA/ normal expansion CV - RRR/normal S1 and S2; no murmurs, rubs or gallops;  PMI nondisplaced Abdomen -NT/ND, no HSM, no mass, + bowel sounds, no bruit 2+ femoral pulses, no bruits Ext-no edema, chords, 2+ DP Neuro-grossly nonfocal  ECG 09/02/2012-sinus bradycardia with left bundle branch block.

## 2012-09-09 ENCOUNTER — Ambulatory Visit (HOSPITAL_COMMUNITY): Payer: Medicare Other | Attending: Cardiology | Admitting: Radiology

## 2012-09-09 ENCOUNTER — Telehealth (HOSPITAL_COMMUNITY): Payer: Self-pay

## 2012-09-09 VITALS — BP 122/57 | HR 52 | Ht 65.5 in | Wt 160.0 lb

## 2012-09-09 DIAGNOSIS — R0602 Shortness of breath: Secondary | ICD-10-CM

## 2012-09-09 DIAGNOSIS — R5383 Other fatigue: Secondary | ICD-10-CM | POA: Insufficient documentation

## 2012-09-09 DIAGNOSIS — Z87891 Personal history of nicotine dependence: Secondary | ICD-10-CM | POA: Insufficient documentation

## 2012-09-09 DIAGNOSIS — R0609 Other forms of dyspnea: Secondary | ICD-10-CM | POA: Insufficient documentation

## 2012-09-09 DIAGNOSIS — R9431 Abnormal electrocardiogram [ECG] [EKG]: Secondary | ICD-10-CM | POA: Diagnosis not present

## 2012-09-09 DIAGNOSIS — R0989 Other specified symptoms and signs involving the circulatory and respiratory systems: Secondary | ICD-10-CM | POA: Insufficient documentation

## 2012-09-09 DIAGNOSIS — I447 Left bundle-branch block, unspecified: Secondary | ICD-10-CM | POA: Insufficient documentation

## 2012-09-09 DIAGNOSIS — R5381 Other malaise: Secondary | ICD-10-CM | POA: Insufficient documentation

## 2012-09-09 MED ORDER — ADENOSINE (DIAGNOSTIC) 3 MG/ML IV SOLN
0.5600 mg/kg | Freq: Once | INTRAVENOUS | Status: AC
  Administered 2012-09-09: 40.8 mg via INTRAVENOUS

## 2012-09-09 MED ORDER — TECHNETIUM TC 99M SESTAMIBI GENERIC - CARDIOLITE
11.0000 | Freq: Once | INTRAVENOUS | Status: AC | PRN
  Administered 2012-09-09: 11 via INTRAVENOUS

## 2012-09-09 MED ORDER — TECHNETIUM TC 99M SESTAMIBI GENERIC - CARDIOLITE
33.0000 | Freq: Once | INTRAVENOUS | Status: AC | PRN
  Administered 2012-09-09: 33 via INTRAVENOUS

## 2012-09-09 NOTE — Telephone Encounter (Signed)
Patient here today for a Nuclear Cardiolite Study. Patient complaining that he pulled a tick off his (L) wrist yesterday. The area is red and itches.Notified Dr. Claris Che Office with an appointment made tomorrow with Dr, Clent Ridges @11 :Sabino Snipes, RN

## 2012-09-09 NOTE — Progress Notes (Signed)
[facility] SITE 3 NUCLEAR MED [address] [place], [place] [address] [phone]    Cardiology Nuclear Med Study  Allen Wheeler is a 79 y.o. male     MRN : [id number]     DOB: 24-Sep-1919  Procedure Date: 09/09/2012  Nuclear Med Background Indication for Stress Test:  Evaluation for Ischemia and Abnormal EKG History:  No previously documented CAD Cardiac Risk Factors: History of Smoking and New LBBB  Symptoms:  DOE and Fatigue   Nuclear Pre-Procedure Caffeine/Decaff Intake:  None > 12 hrs NPO After: 9:30pm   Lungs:  Clear. O2 Sat: 96% on room air. IV 0.9% NS with Angio Cath:  22g  IV Site: R Antecubital, tolerated well IV Started by:  Irean Hong, RN  Chest Size (in):  44 Cup Size: n/a  Height: 5' 5.5" (1.664 m)  Weight:  160 lb (72.576 kg)  BMI:  Body mass index is 26.21 kg/(m^2). Tech Comments:  N/a    Nuclear Med Study 1 or 2 day study: 1 day  Stress Test Type:  Adenosine  Reading MD: Willa Rough, MD  Order Authorizing Provider:  Olga Millers, MD  Resting Radionuclide: Technetium 72m Sestamibi  Resting Radionuclide Dose: 11.0 mCi   Stress Radionuclide:  Technetium 54m Sestamibi  Stress Radionuclide Dose: 33.0 mCi           Stress Protocol Rest HR: 52 Stress HR: 61  Rest BP: 122/57 Stress BP: 126/67  Exercise Time (min): n/a METS: n/a   Predicted Max HR: 130 bpm % Max HR: 46.92 bpm Rate Pressure Product: 7686   Dose of Adenosine (mg):  40.7 Dose of Lexiscan: n/a mg  Dose of Atropine (mg): n/a Dose of Dobutamine: n/a mcg/kg/min (at max HR)  Stress Test Technologist: Smiley Houseman, CMA-N  Nuclear Technologist:  Domenic Polite, CNMT     Rest Procedure:  Myocardial perfusion imaging was performed at rest 45 minutes following the intravenous administration of Technetium 39m Sestamibi.  Rest ECG: Normal sinus rhythm with left bundle branch block  Stress Procedure:  The patient received IV adenosine at 140 mcg/kg/min for 4 minutes.  Technetium  87m Sestamibi was injected at the 2 minute mark and quantitative spect images were obtained after a 45 minute delay.  Stress ECG: No significant change from baseline ECG  QPS Raw Data Images:  Patient motion noted; appropriate software correction applied. Stress Images:  Normal homogeneous uptake in all areas of the myocardium. Rest Images:  Normal homogeneous uptake in all areas of the myocardium. Subtraction (SDS):  No evidence of ischemia. Transient Ischemic Dilatation (Normal <1.22):  0.99 Lung/Heart Ratio (Normal <0.45):  0.35  Quantitative Gated Spect Images QGS EDV:  62 ml QGS ESV:  21 ml  Impression Exercise Capacity:  Adenosine study with no exercise. BP Response:  Normal blood pressure response. Clinical Symptoms:  neck stiffness ECG Impression:  No significant ST segment change suggestive of ischemia. Comparison with Prior Nuclear Study: No previous nuclear study performed  Overall Impression:  Normal stress nuclear study. There is no scar or ischemia. This is a low-risk scan.  LV Ejection Fraction: 66%.  LV Wall Motion:  Normal Wall Motion.  Willa Rough, MD

## 2012-09-10 ENCOUNTER — Ambulatory Visit (INDEPENDENT_AMBULATORY_CARE_PROVIDER_SITE_OTHER): Payer: Medicare Other | Admitting: Family Medicine

## 2012-09-10 ENCOUNTER — Ambulatory Visit: Payer: Medicare Other | Admitting: Family Medicine

## 2012-09-10 ENCOUNTER — Encounter: Payer: Self-pay | Admitting: Family Medicine

## 2012-09-10 VITALS — BP 122/68 | HR 62 | Temp 98.2°F | Wt 168.0 lb

## 2012-09-10 DIAGNOSIS — W57XXXA Bitten or stung by nonvenomous insect and other nonvenomous arthropods, initial encounter: Secondary | ICD-10-CM

## 2012-09-10 DIAGNOSIS — T148 Other injury of unspecified body region: Secondary | ICD-10-CM | POA: Diagnosis not present

## 2012-09-10 DIAGNOSIS — K5900 Constipation, unspecified: Secondary | ICD-10-CM | POA: Diagnosis not present

## 2012-09-10 NOTE — Progress Notes (Signed)
  Subjective:    Patient ID: Allen Wheeler, male    DOB: 02-14-1920, 79 y.o.   MRN: [id number]  HPI Here for 2 things. First he has been constipated over the past week with bloating and passing small stools after a lot of effort. No pain or nausea. Second, 2 days ago he pulled a small tick out of his left hand. The area got a little red but otherwise he has felt fine.   Review of Systems  Constitutional: Negative.   Gastrointestinal: Positive for constipation. Negative for nausea, vomiting, abdominal pain, diarrhea, blood in stool, abdominal distention and rectal pain.       Objective:   Physical Exam  Constitutional: He appears well-developed and well-nourished. No distress.  Abdominal: Soft. Bowel sounds are normal. He exhibits no distension and no mass. There is no tenderness. There is no rebound and no guarding.  Skin:  Small area of erythema on the dorsal left hand. Not warm or tender.           Assessment & Plan:  The tick bite should resolve. For his bowels I suggested he use Miralax daily.

## 2012-09-11 ENCOUNTER — Telehealth: Payer: Self-pay | Admitting: Cardiology

## 2012-09-11 NOTE — Telephone Encounter (Signed)
Reviewed ECHO results with patient who verbalized understanding.  Patient would like Dr. Jens Som to call him when he returns from vacation to explain what might be causing the conduction defect that showed up on his EKG.  I explained that Dr. Jens Som was away until June 16.  Patient verbalized understanding.

## 2012-09-11 NOTE — Telephone Encounter (Signed)
New Problem  Pt is calling regarding his stress test results.

## 2012-09-17 ENCOUNTER — Other Ambulatory Visit: Payer: Self-pay | Admitting: Family Medicine

## 2012-09-20 DIAGNOSIS — IMO0001 Reserved for inherently not codable concepts without codable children: Secondary | ICD-10-CM

## 2012-09-20 HISTORY — DX: Reserved for inherently not codable concepts without codable children: IMO0001

## 2012-09-26 NOTE — Telephone Encounter (Signed)
Discussed with dr Jens Som, Spoke with pt, aware his conduction abnormality on his EKG is fine. pts questions answered.

## 2012-10-27 ENCOUNTER — Encounter: Payer: Self-pay | Admitting: Family Medicine

## 2012-10-27 ENCOUNTER — Ambulatory Visit (INDEPENDENT_AMBULATORY_CARE_PROVIDER_SITE_OTHER): Payer: Medicare Other | Admitting: Family Medicine

## 2012-10-27 VITALS — BP 138/70 | HR 67 | Temp 97.9°F | Wt 165.0 lb

## 2012-10-27 DIAGNOSIS — R05 Cough: Secondary | ICD-10-CM | POA: Diagnosis not present

## 2012-10-27 DIAGNOSIS — K219 Gastro-esophageal reflux disease without esophagitis: Secondary | ICD-10-CM | POA: Diagnosis not present

## 2012-10-27 MED ORDER — OMEPRAZOLE 40 MG PO CPDR: 40.0000 mg | DELAYED_RELEASE_CAPSULE | Freq: Every day | ORAL | Status: DC

## 2012-10-27 MED ORDER — HYDROCODONE-HOMATROPINE 5-1.5 MG/5ML PO SYRP: 5.0000 mL | ORAL_SOLUTION | ORAL | Status: DC | PRN

## 2012-10-27 NOTE — Progress Notes (Signed)
  Subjective:    Patient ID: Allen Wheeler, male    DOB: [birth year], 79 y.o.   MRN: [id number]  HPI Here for 5 days of hard dry coughing which he attributes to "allergies". He denies any sinus pressure or PND, no ST or fever. He is taking Zyrtec daily. He is on Prilosec 20 mg daily for GERD and this hellps a lot, but he still feels some mild epigastric burning at times.    Review of Systems  Constitutional: Negative.   HENT: Negative.   Eyes: Negative.   Respiratory: Positive for cough. Negative for shortness of breath and wheezing.   Cardiovascular: Negative.        Objective:   Physical Exam  Constitutional: He appears well-developed and well-nourished.  HENT:  Right Ear: External ear normal.  Left Ear: External ear normal.  Nose: Nose normal.  Mouth/Throat: Oropharynx is clear and moist.  Eyes: Conjunctivae are normal.  Neck: No thyromegaly present.  Cardiovascular: Normal rate, regular rhythm, normal heart sounds and intact distal pulses.   Pulmonary/Chest: Effort normal and breath sounds normal. No respiratory distress. He has no wheezes. He has no rales.  Lymphadenopathy:    He has no cervical adenopathy.          Assessment & Plan:  His cough seems to be related to GERD, so we will increase the Prilosec to 40 mg daily. Use Hydromet prn.

## 2012-11-04 ENCOUNTER — Telehealth: Payer: Self-pay | Admitting: Family Medicine

## 2012-11-04 MED ORDER — CITALOPRAM HYDROBROMIDE 20 MG PO TABS: 20.0000 mg | ORAL_TABLET | Freq: Every day | ORAL | Status: DC

## 2012-11-04 NOTE — Telephone Encounter (Signed)
I sent script e-scribe. 

## 2012-11-04 NOTE — Telephone Encounter (Signed)
Refill request for Celexa 20 mg take 1 po qd

## 2012-11-04 NOTE — Telephone Encounter (Signed)
Refill for one year 

## 2012-11-21 DIAGNOSIS — H908 Mixed conductive and sensorineural hearing loss, unspecified: Secondary | ICD-10-CM | POA: Diagnosis not present

## 2012-11-21 DIAGNOSIS — H9210 Otorrhea, unspecified ear: Secondary | ICD-10-CM | POA: Diagnosis not present

## 2012-11-21 DIAGNOSIS — H7200 Central perforation of tympanic membrane, unspecified ear: Secondary | ICD-10-CM | POA: Diagnosis not present

## 2012-12-09 ENCOUNTER — Telehealth: Payer: Self-pay | Admitting: Family Medicine

## 2012-12-09 NOTE — Telephone Encounter (Signed)
Refill request for Lorazepam 1 mg take 1 po qhs prn.

## 2012-12-10 MED ORDER — LORAZEPAM 1 MG PO TABS: 1.0000 mg | ORAL_TABLET | Freq: Every evening | ORAL | Status: DC | PRN

## 2012-12-10 NOTE — Telephone Encounter (Signed)
Refill for 6 months. 

## 2012-12-10 NOTE — Telephone Encounter (Signed)
I called in script 

## 2012-12-25 DIAGNOSIS — Z23 Encounter for immunization: Secondary | ICD-10-CM | POA: Diagnosis not present

## 2013-02-21 ENCOUNTER — Other Ambulatory Visit: Payer: Self-pay | Admitting: Family Medicine

## 2013-03-18 DIAGNOSIS — H6120 Impacted cerumen, unspecified ear: Secondary | ICD-10-CM | POA: Diagnosis not present

## 2013-03-18 DIAGNOSIS — H7200 Central perforation of tympanic membrane, unspecified ear: Secondary | ICD-10-CM | POA: Diagnosis not present

## 2013-03-18 DIAGNOSIS — H908 Mixed conductive and sensorineural hearing loss, unspecified: Secondary | ICD-10-CM | POA: Diagnosis not present

## 2013-03-31 ENCOUNTER — Encounter: Payer: Self-pay | Admitting: Family Medicine

## 2013-03-31 ENCOUNTER — Ambulatory Visit (INDEPENDENT_AMBULATORY_CARE_PROVIDER_SITE_OTHER): Payer: Medicare Other | Admitting: Family Medicine

## 2013-03-31 VITALS — BP 138/80 | Temp 97.4°F | Wt 168.0 lb

## 2013-03-31 DIAGNOSIS — L259 Unspecified contact dermatitis, unspecified cause: Secondary | ICD-10-CM | POA: Diagnosis not present

## 2013-03-31 DIAGNOSIS — L309 Dermatitis, unspecified: Secondary | ICD-10-CM

## 2013-03-31 MED ORDER — TRIAMCINOLONE ACETONIDE 0.1 % EX CREA: 1.0000 "application " | TOPICAL_CREAM | Freq: Two times a day (BID) | CUTANEOUS | Status: DC

## 2013-03-31 NOTE — Progress Notes (Signed)
Pre visit review using our clinic review tool, if applicable. No additional management support is needed unless otherwise documented below in the visit note. 

## 2013-03-31 NOTE — Patient Instructions (Addendum)
-  CERAVE cream or CETAPHIL restoraderm cream daily  -Humidifier in the bedroom - use every night  -Steroid cream (traimcinilone) 1-2 times daily for 1-2 weeks  Follow up as needed

## 2013-03-31 NOTE — Progress Notes (Signed)
No chief complaint on file.   HPI:  Acute visit for itchy skin: -dry itchy skin on arms, shoulders, trunk, legs and back in the winter -treated with cream and steroids in the past  ROS: See pertinent positives and negatives per HPI.  Past Medical History  Diagnosis Date  . Nephrolithiasis   . Osteoarthritis   . Chickenpox   . GERD (gastroesophageal reflux disease)   . Anxiety   . Depression   . Allergy   . Hearing loss     wears hearing aids    Past Surgical History  Procedure Laterality Date  . Hernia repair      wears truss  . Mastoidectomy      Family History  Problem Relation Age of Onset  . Heart disease Mother     Pacemaker  . Colon cancer      History   Social History  . Marital Status: Widowed    Spouse Name: N/A    Number of Children: 1  . 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 Smoking status: Former Games developer  . Smokeless tobacco: Never Used  . Alcohol Use: 3.5 oz/week    7 drink(s) per week  . Drug Use: No  . Sexual Activity: None   Other Topics Concern  . None   Social History Narrative  . None    Current outpatient prescriptions:azelastine (ASTELIN) 137 MCG/SPRAY nasal spray, Place 2 sprays into the nose daily. Use in each nostril as directed, Disp: , Rfl: ;  citalopram (CELEXA) 20 MG tablet, Take 1 tablet (20 mg total) by mouth daily., Disp: 30 tablet, Rfl: 11;  fluticasone (FLONASE) 50 MCG/ACT nasal spray, USE 2 SPRAYS IN EACH NOSTRIL ONCE DAILY, Disp: 16 g, Rfl: 6 HYDROcodone-homatropine (HYDROMET) 5-1.5 MG/5ML syrup, Take 5 mLs by mouth every 4 (four) hours as needed for cough., Disp: 240 mL, Rfl: 0;  ibuprofen (ADVIL,MOTRIN) 200 MG tablet, Take 200 mg by mouth every 6 (six) hours as needed for pain., Disp: , Rfl: ;  LORazepam (ATIVAN) 1 MG tablet, Take 1 tablet (1 mg total) by mouth at bedtime as needed., Disp: 30 tablet, Rfl: 5 neomycin-polymyxin-hydrocortisone (CORTISPORIN) otic  solution, Place 3 drops into both ears 2 (two) times daily as needed., Disp: , Rfl: ;  omeprazole (PRILOSEC) 40 MG capsule, Take 1 capsule (40 mg total) by mouth daily., Disp: 30 capsule, Rfl: 11;  triamcinolone cream (KENALOG) 0.1 %, Apply 1 application topically 2 (two) times daily., Disp: 30 g, Rfl: 0  EXAM:  Filed Vitals:   03/31/13 1510  BP: 138/80  Temp: 97.4 F (36.3 C)    Body mass index is 27.52 kg/(m^2).  GENERAL: vitals reviewed and listed above, alert, oriented, appears well hydrated and in no acute distress  HEENT: atraumatic, conjunttiva clear, no obvious abnormalities on inspection of external nose and ears  NECK: no obvious masses on inspection  LUNGS: clear to auscultation bilaterally, no wheezes, rales or rhonchi, good air movement  CV: HRRR, no peripheral edema  MS: moves all extremities without noticeable abnormality  PSYCH: pleasant and cooperative, no obvious depression or anxiety  ASSESSMENT AND PLAN:  Discussed the following assessment and plan:  Eczema - Plan: triamcinolone cream (KENALOG) 0.1 %  -Patient advised to return or notify a doctor immediately if symptoms worsen or persist or new concerns arise.  Patient Instructions  -CERAVE cream or CETAPHIL restoraderm cream daily  -Humidifier in the bedroom -  use every night  -Steroid cream (traimcinilone) 1-2 times daily for 1-2 weeks  Follow up as needed     Paz Fuentes R.

## 2013-05-14 ENCOUNTER — Telehealth: Payer: Self-pay | Admitting: Family Medicine

## 2013-05-14 NOTE — Telephone Encounter (Signed)
Call in #30 with 5 rf 

## 2013-05-14 NOTE — Telephone Encounter (Signed)
[facility] requesting refill of LORazepam (ATIVAN) 1 MG tablet

## 2013-05-15 MED ORDER — LORAZEPAM 1 MG PO TABS: 1.0000 mg | ORAL_TABLET | Freq: Every evening | ORAL | Status: DC | PRN

## 2013-05-15 NOTE — Telephone Encounter (Signed)
I called in script 

## 2013-06-03 ENCOUNTER — Other Ambulatory Visit: Payer: Self-pay | Admitting: Family Medicine

## 2013-06-22 ENCOUNTER — Other Ambulatory Visit: Payer: Self-pay | Admitting: Family Medicine

## 2013-07-17 DIAGNOSIS — H908 Mixed conductive and sensorineural hearing loss, unspecified: Secondary | ICD-10-CM | POA: Diagnosis not present

## 2013-07-17 DIAGNOSIS — H9210 Otorrhea, unspecified ear: Secondary | ICD-10-CM | POA: Diagnosis not present

## 2013-07-17 DIAGNOSIS — J310 Chronic rhinitis: Secondary | ICD-10-CM | POA: Diagnosis not present

## 2013-07-17 DIAGNOSIS — H7200 Central perforation of tympanic membrane, unspecified ear: Secondary | ICD-10-CM | POA: Diagnosis not present

## 2013-08-13 DIAGNOSIS — H908 Mixed conductive and sensorineural hearing loss, unspecified: Secondary | ICD-10-CM | POA: Diagnosis not present

## 2013-08-13 DIAGNOSIS — H9210 Otorrhea, unspecified ear: Secondary | ICD-10-CM | POA: Diagnosis not present

## 2013-08-13 DIAGNOSIS — H7200 Central perforation of tympanic membrane, unspecified ear: Secondary | ICD-10-CM | POA: Diagnosis not present

## 2013-09-08 ENCOUNTER — Encounter: Payer: Self-pay | Admitting: Family Medicine

## 2013-09-08 ENCOUNTER — Ambulatory Visit (INDEPENDENT_AMBULATORY_CARE_PROVIDER_SITE_OTHER): Payer: Medicare Other | Admitting: Family Medicine

## 2013-09-08 VITALS — BP 133/75 | HR 64 | Ht 65.5 in | Wt 166.0 lb

## 2013-09-08 DIAGNOSIS — R110 Nausea: Secondary | ICD-10-CM

## 2013-09-08 DIAGNOSIS — M1990 Unspecified osteoarthritis, unspecified site: Secondary | ICD-10-CM

## 2013-09-08 DIAGNOSIS — F3289 Other specified depressive episodes: Secondary | ICD-10-CM

## 2013-09-08 DIAGNOSIS — R030 Elevated blood-pressure reading, without diagnosis of hypertension: Secondary | ICD-10-CM | POA: Diagnosis not present

## 2013-09-08 DIAGNOSIS — F411 Generalized anxiety disorder: Secondary | ICD-10-CM

## 2013-09-08 DIAGNOSIS — R9431 Abnormal electrocardiogram [ECG] [EKG]: Secondary | ICD-10-CM

## 2013-09-08 DIAGNOSIS — F329 Major depressive disorder, single episode, unspecified: Secondary | ICD-10-CM

## 2013-09-08 DIAGNOSIS — K219 Gastro-esophageal reflux disease without esophagitis: Secondary | ICD-10-CM

## 2013-09-08 LAB — CBC WITH DIFFERENTIAL/PLATELET
BASOS ABS: 0 10*3/uL (ref 0.0–0.1)
Basophils Relative: 0.4 % (ref 0.0–3.0)
Eosinophils Absolute: 0.1 10*3/uL (ref 0.0–0.7)
Eosinophils Relative: 1.7 % (ref 0.0–5.0)
HCT: 44.7 % (ref 39.0–52.0)
Hemoglobin: 15 g/dL (ref 13.0–17.0)
LYMPHS PCT: 32.8 % (ref 12.0–46.0)
Lymphs Abs: 2.1 10*3/uL (ref 0.7–4.0)
MCHC: 33.5 g/dL (ref 30.0–36.0)
MCV: 97.3 fl (ref 78.0–100.0)
Monocytes Absolute: 0.7 10*3/uL (ref 0.1–1.0)
Monocytes Relative: 11.3 % (ref 3.0–12.0)
NEUTROS PCT: 53.8 % (ref 43.0–77.0)
Neutro Abs: 3.4 10*3/uL (ref 1.4–7.7)
Platelets: 204 10*3/uL (ref 150.0–400.0)
RBC: 4.59 Mil/uL (ref 4.22–5.81)
RDW: 13.6 % (ref 11.5–15.5)
WBC: 6.4 10*3/uL (ref 4.0–10.5)

## 2013-09-08 LAB — BASIC METABOLIC PANEL
BUN: 16 mg/dL (ref 6–23)
CALCIUM: 9.2 mg/dL (ref 8.4–10.5)
CO2: 25 meq/L (ref 19–32)
CREATININE: 1.3 mg/dL (ref 0.4–1.5)
Chloride: 103 mEq/L (ref 96–112)
GFR: 54.92 mL/min — ABNORMAL LOW (ref >60.00)
GLUCOSE: 68 mg/dL — AB (ref 70–99)
Potassium: 4.3 mEq/L (ref 3.5–5.1)
Sodium: 136 mEq/L (ref 135–145)

## 2013-09-08 LAB — LIPID PANEL
Cholesterol: 222 mg/dL — ABNORMAL HIGH (ref 0–200)
HDL: 32.5 mg/dL — AB (ref >39.00)
LDL Cholesterol: 157 mg/dL — ABNORMAL HIGH (ref 0–99)
TRIGLYCERIDES: 161 mg/dL — AB (ref 0.0–149.0)
Total CHOL/HDL Ratio: 7
VLDL: 32.2 mg/dL (ref 0.0–40.0)

## 2013-09-08 LAB — POCT URINALYSIS DIPSTICK
BILIRUBIN UA: NEGATIVE
GLUCOSE UA: NEGATIVE
KETONES UA: NEGATIVE
Nitrite, UA: NEGATIVE
Protein, UA: NEGATIVE
RBC UA: NEGATIVE
SPEC GRAV UA: 1.015
Urobilinogen, UA: 0.2
pH, UA: 6

## 2013-09-08 LAB — HEPATIC FUNCTION PANEL
ALK PHOS: 60 U/L (ref 39–117)
ALT: 18 U/L (ref 0–53)
AST: 24 U/L (ref 0–37)
Albumin: 3.8 g/dL (ref 3.5–5.2)
BILIRUBIN DIRECT: 0 mg/dL (ref 0.0–0.3)
BILIRUBIN TOTAL: 0.9 mg/dL (ref 0.2–1.2)
Total Protein: 7.4 g/dL (ref 6.0–8.3)

## 2013-09-08 LAB — TSH: TSH: 1.9 u[IU]/mL (ref 0.35–4.50)

## 2013-09-08 NOTE — Progress Notes (Signed)
Pre visit review using our clinic review tool, if applicable. No additional management support is needed unless otherwise documented below in the visit note. 

## 2013-09-08 NOTE — Progress Notes (Signed)
   Subjective:    Patient ID: Allen Wheeler, male    DOB: 1919/09/17, 80 y.o.   MRN: [id number]  HPI 80 yr old male for a cpx. He has been doing well in general. He remains active socially, he walks 30 minutes a day, and he enjoys gardening. He still misses his wife who died 2 years ago and he gets mildly depressed at times. His appetite and sleep are good. He does mention some nausea that comes about 10-15 minutes after meals. There is no pain in the abdomen, just nausea. After 20 minutes this passes. His BMs are normal.    Review of Systems  Constitutional: Negative.   HENT: Negative.   Eyes: Negative.   Respiratory: Negative.   Cardiovascular: Negative.   Gastrointestinal: Positive for nausea. Negative for vomiting, abdominal pain, diarrhea, constipation, blood in stool, abdominal distention, anal bleeding and rectal pain.  Genitourinary: Negative.   Musculoskeletal: Negative.   Skin: Negative.   Neurological: Negative.   Psychiatric/Behavioral: Negative.        Objective:   Physical Exam  Constitutional: He is oriented to person, place, and time. He appears well-developed and well-nourished. No distress.  HENT:  Head: Normocephalic and atraumatic.  Right Ear: External ear normal.  Left Ear: External ear normal.  Nose: Nose normal.  Mouth/Throat: Oropharynx is clear and moist. No oropharyngeal exudate.  Eyes: Conjunctivae and EOM are normal. Pupils are equal, round, and reactive to light. Right eye exhibits no discharge. Left eye exhibits no discharge. No scleral icterus.  Neck: Neck supple. No JVD present. No tracheal deviation present. No thyromegaly present.  Cardiovascular: Normal rate, regular rhythm, normal heart sounds and intact distal pulses.  Exam reveals no gallop and no friction rub.   No murmur heard. Pulmonary/Chest: Effort normal and breath sounds normal. No respiratory distress. He has no wheezes. He has no rales. He exhibits no tenderness.  Abdominal: Soft.  Bowel sounds are normal. He exhibits no distension and no mass. There is no tenderness. There is no rebound and no guarding.  Genitourinary: Rectum normal, prostate normal and penis normal. Guaiac negative stool. No penile tenderness.  Musculoskeletal: Normal range of motion. He exhibits no edema and no tenderness.  Lymphadenopathy:    He has no cervical adenopathy.  Neurological: He is alert and oriented to person, place, and time. He has normal reflexes. No cranial nerve deficit. He exhibits normal muscle tone. Coordination normal.  Skin: Skin is warm and dry. No rash noted. He is not diaphoretic. No erythema. No pallor.  Psychiatric: He has a normal mood and affect. His behavior is normal. Judgment and thought content normal.          Assessment & Plan:  Well exam. Get fasting labs. Set up an [place] to evaluate the gall bladder

## 2013-09-17 ENCOUNTER — Other Ambulatory Visit: Payer: Medicare Other

## 2013-09-21 ENCOUNTER — Ambulatory Visit
Admission: RE | Admit: 2013-09-21 | Discharge: 2013-09-21 | Disposition: A | Discharge status: Home / Self Care | Payer: Medicare Other | Source: Ambulatory Visit | Attending: Family Medicine | Admitting: Family Medicine

## 2013-09-21 ENCOUNTER — Other Ambulatory Visit (INDEPENDENT_AMBULATORY_CARE_PROVIDER_SITE_OTHER): Payer: Medicare Other

## 2013-09-21 DIAGNOSIS — K921 Melena: Secondary | ICD-10-CM

## 2013-09-21 DIAGNOSIS — R110 Nausea: Secondary | ICD-10-CM | POA: Diagnosis not present

## 2013-09-21 LAB — POC HEMOCCULT BLD/STL (HOME/3-CARD/SCREEN)
Card #2 Date: NEGATIVE
Card #2 Fecal Occult Blod, POC: NEGATIVE
Card #3 Date: NEGATIVE
Card #3 Fecal Occult Blood, POC: NEGATIVE
Fecal Occult Blood, POC: NEGATIVE
OCCULT BLOOD DATE: NEGATIVE

## 2013-10-08 ENCOUNTER — Other Ambulatory Visit: Payer: Self-pay | Admitting: Family Medicine

## 2013-10-12 DIAGNOSIS — H9210 Otorrhea, unspecified ear: Secondary | ICD-10-CM | POA: Diagnosis not present

## 2013-10-12 DIAGNOSIS — H908 Mixed conductive and sensorineural hearing loss, unspecified: Secondary | ICD-10-CM | POA: Diagnosis not present

## 2013-10-12 DIAGNOSIS — H7200 Central perforation of tympanic membrane, unspecified ear: Secondary | ICD-10-CM | POA: Diagnosis not present

## 2013-10-27 ENCOUNTER — Telehealth: Payer: Self-pay | Admitting: Family Medicine

## 2013-10-27 NOTE — Telephone Encounter (Signed)
[facility] [place], [address] Is requesting a re-fill on citalopram (CELEXA) 20 MG tablet

## 2013-10-28 MED ORDER — CITALOPRAM HYDROBROMIDE 20 MG PO TABS: 20.0000 mg | ORAL_TABLET | Freq: Every day | ORAL | Status: DC

## 2013-10-28 NOTE — Telephone Encounter (Signed)
I sent script e-scribe, okay per Dr. Sarajane Jews.

## 2013-11-17 ENCOUNTER — Telehealth: Payer: Self-pay | Admitting: Family Medicine

## 2013-11-17 NOTE — Telephone Encounter (Signed)
[facility] [place], [address] Is requesting re-fill on omeprazole (PRILOSEC) 40 MG capsule

## 2013-11-18 MED ORDER — OMEPRAZOLE 40 MG PO CPDR: 40.0000 mg | DELAYED_RELEASE_CAPSULE | Freq: Every day | ORAL | Status: DC

## 2013-11-18 NOTE — Telephone Encounter (Signed)
Rx done. 

## 2013-11-20 DIAGNOSIS — H6120 Impacted cerumen, unspecified ear: Secondary | ICD-10-CM | POA: Diagnosis not present

## 2013-11-20 DIAGNOSIS — H7200 Central perforation of tympanic membrane, unspecified ear: Secondary | ICD-10-CM | POA: Diagnosis not present

## 2013-11-20 DIAGNOSIS — H908 Mixed conductive and sensorineural hearing loss, unspecified: Secondary | ICD-10-CM | POA: Diagnosis not present

## 2013-11-20 DIAGNOSIS — B369 Superficial mycosis, unspecified: Secondary | ICD-10-CM | POA: Diagnosis not present

## 2013-11-20 DIAGNOSIS — H6240 Otitis externa in other diseases classified elsewhere, unspecified ear: Secondary | ICD-10-CM | POA: Diagnosis not present

## 2013-12-04 ENCOUNTER — Telehealth: Payer: Self-pay | Admitting: Family Medicine

## 2013-12-04 NOTE — Telephone Encounter (Signed)
Sorry but I am full

## 2013-12-04 NOTE — Telephone Encounter (Signed)
Pt called to ask if Dr Sarajane Jews will accept his 80 year old grand son as a new patient

## 2013-12-09 NOTE — Telephone Encounter (Signed)
Lmovm  about decision

## 2013-12-15 ENCOUNTER — Telehealth: Payer: Self-pay | Admitting: Family Medicine

## 2013-12-15 ENCOUNTER — Other Ambulatory Visit: Payer: Self-pay | Admitting: Family Medicine

## 2013-12-15 NOTE — Telephone Encounter (Signed)
Refill request for Lorazepam 1 mg take 1 po qhs prn and send to [facility].

## 2013-12-16 MED ORDER — LORAZEPAM 1 MG PO TABS: 1.0000 mg | ORAL_TABLET | Freq: Every evening | ORAL | Status: DC | PRN

## 2013-12-16 NOTE — Telephone Encounter (Signed)
Call in #30 with 5 rf 

## 2013-12-16 NOTE — Telephone Encounter (Signed)
Rx done. 

## 2013-12-22 ENCOUNTER — Inpatient Hospital Stay (HOSPITAL_COMMUNITY)
Admission: EM | Admit: 2013-12-22 | Discharge: 2013-12-23 | DRG: 446 | Disposition: A | Discharge status: Home / Self Care | Payer: Medicare Other | Attending: Internal Medicine | Admitting: Internal Medicine

## 2013-12-22 ENCOUNTER — Inpatient Hospital Stay (HOSPITAL_COMMUNITY): Payer: Medicare Other

## 2013-12-22 ENCOUNTER — Encounter (HOSPITAL_COMMUNITY): Payer: Self-pay | Admitting: Emergency Medicine

## 2013-12-22 ENCOUNTER — Emergency Department (HOSPITAL_COMMUNITY): Payer: Medicare Other

## 2013-12-22 DIAGNOSIS — Z79899 Other long term (current) drug therapy: Secondary | ICD-10-CM | POA: Diagnosis not present

## 2013-12-22 DIAGNOSIS — R918 Other nonspecific abnormal finding of lung field: Secondary | ICD-10-CM | POA: Diagnosis not present

## 2013-12-22 DIAGNOSIS — R9431 Abnormal electrocardiogram [ECG] [EKG]: Secondary | ICD-10-CM

## 2013-12-22 DIAGNOSIS — J309 Allergic rhinitis, unspecified: Secondary | ICD-10-CM

## 2013-12-22 DIAGNOSIS — F3289 Other specified depressive episodes: Secondary | ICD-10-CM

## 2013-12-22 DIAGNOSIS — L299 Pruritus, unspecified: Secondary | ICD-10-CM

## 2013-12-22 DIAGNOSIS — H60399 Other infective otitis externa, unspecified ear: Secondary | ICD-10-CM

## 2013-12-22 DIAGNOSIS — R5381 Other malaise: Secondary | ICD-10-CM

## 2013-12-22 DIAGNOSIS — K2970 Gastritis, unspecified, without bleeding: Secondary | ICD-10-CM | POA: Diagnosis not present

## 2013-12-22 DIAGNOSIS — R748 Abnormal levels of other serum enzymes: Secondary | ICD-10-CM | POA: Diagnosis not present

## 2013-12-22 DIAGNOSIS — N401 Enlarged prostate with lower urinary tract symptoms: Secondary | ICD-10-CM

## 2013-12-22 DIAGNOSIS — F411 Generalized anxiety disorder: Secondary | ICD-10-CM | POA: Diagnosis present

## 2013-12-22 DIAGNOSIS — K8000 Calculus of gallbladder with acute cholecystitis without obstruction: Secondary | ICD-10-CM | POA: Diagnosis present

## 2013-12-22 DIAGNOSIS — R10819 Abdominal tenderness, unspecified site: Secondary | ICD-10-CM | POA: Diagnosis not present

## 2013-12-22 DIAGNOSIS — F329 Major depressive disorder, single episode, unspecified: Secondary | ICD-10-CM | POA: Diagnosis present

## 2013-12-22 DIAGNOSIS — Z87891 Personal history of nicotine dependence: Secondary | ICD-10-CM

## 2013-12-22 DIAGNOSIS — R7989 Other specified abnormal findings of blood chemistry: Secondary | ICD-10-CM | POA: Diagnosis not present

## 2013-12-22 DIAGNOSIS — K8001 Calculus of gallbladder with acute cholecystitis with obstruction: Secondary | ICD-10-CM | POA: Diagnosis not present

## 2013-12-22 DIAGNOSIS — H9190 Unspecified hearing loss, unspecified ear: Secondary | ICD-10-CM | POA: Diagnosis present

## 2013-12-22 DIAGNOSIS — Z87442 Personal history of urinary calculi: Secondary | ICD-10-CM

## 2013-12-22 DIAGNOSIS — K8020 Calculus of gallbladder without cholecystitis without obstruction: Secondary | ICD-10-CM | POA: Diagnosis not present

## 2013-12-22 DIAGNOSIS — K219 Gastro-esophageal reflux disease without esophagitis: Secondary | ICD-10-CM | POA: Diagnosis present

## 2013-12-22 DIAGNOSIS — R5383 Other fatigue: Secondary | ICD-10-CM

## 2013-12-22 DIAGNOSIS — K829 Disease of gallbladder, unspecified: Secondary | ICD-10-CM | POA: Diagnosis not present

## 2013-12-22 DIAGNOSIS — R109 Unspecified abdominal pain: Secondary | ICD-10-CM | POA: Diagnosis not present

## 2013-12-22 DIAGNOSIS — N138 Other obstructive and reflux uropathy: Secondary | ICD-10-CM

## 2013-12-22 DIAGNOSIS — J019 Acute sinusitis, unspecified: Secondary | ICD-10-CM

## 2013-12-22 DIAGNOSIS — R404 Transient alteration of awareness: Secondary | ICD-10-CM

## 2013-12-22 DIAGNOSIS — M1990 Unspecified osteoarthritis, unspecified site: Secondary | ICD-10-CM

## 2013-12-22 DIAGNOSIS — R030 Elevated blood-pressure reading, without diagnosis of hypertension: Secondary | ICD-10-CM

## 2013-12-22 LAB — CBC WITH DIFFERENTIAL/PLATELET
BASOS PCT: 0 % (ref 0–1)
Basophils Absolute: 0 10*3/uL (ref 0.0–0.1)
EOS ABS: 0 10*3/uL (ref 0.0–0.7)
EOS PCT: 0 % (ref 0–5)
HCT: 39.9 % (ref 39.0–52.0)
Hemoglobin: 14.2 g/dL (ref 13.0–17.0)
LYMPHS ABS: 0.7 10*3/uL (ref 0.7–4.0)
Lymphocytes Relative: 6 % — ABNORMAL LOW (ref 12–46)
MCH: 32.8 pg (ref 26.0–34.0)
MCHC: 35.6 g/dL (ref 30.0–36.0)
MCV: 92.1 fL (ref 78.0–100.0)
Monocytes Absolute: 0.6 10*3/uL (ref 0.1–1.0)
Monocytes Relative: 6 % (ref 3–12)
Neutro Abs: 9.4 10*3/uL — ABNORMAL HIGH (ref 1.7–7.7)
Neutrophils Relative %: 88 % — ABNORMAL HIGH (ref 43–77)
PLATELETS: 151 10*3/uL (ref 150–400)
RBC: 4.33 MIL/uL (ref 4.22–5.81)
RDW: 12.7 % (ref 11.5–15.5)
WBC: 10.7 10*3/uL — ABNORMAL HIGH (ref 4.0–10.5)

## 2013-12-22 LAB — APTT: aPTT: 33 seconds (ref 24–37)

## 2013-12-22 LAB — URINALYSIS, ROUTINE W REFLEX MICROSCOPIC
Bilirubin Urine: NEGATIVE
Glucose, UA: NEGATIVE mg/dL
Hgb urine dipstick: NEGATIVE
KETONES UR: NEGATIVE mg/dL
Leukocytes, UA: NEGATIVE
NITRITE: NEGATIVE
Protein, ur: NEGATIVE mg/dL
Specific Gravity, Urine: 1.015 (ref 1.005–1.030)
Urobilinogen, UA: 1 mg/dL (ref 0.0–1.0)
pH: 8 (ref 5.0–8.0)

## 2013-12-22 LAB — COMPREHENSIVE METABOLIC PANEL
ALT: 429 U/L — ABNORMAL HIGH (ref 0–53)
AST: 716 U/L — ABNORMAL HIGH (ref 0–37)
Albumin: 3.5 g/dL (ref 3.5–5.2)
Alkaline Phosphatase: 140 U/L — ABNORMAL HIGH (ref 39–117)
Anion gap: 15 (ref 5–15)
BUN: 21 mg/dL (ref 6–23)
CALCIUM: 9.1 mg/dL (ref 8.4–10.5)
CO2: 22 mEq/L (ref 19–32)
Chloride: 99 mEq/L (ref 96–112)
Creatinine, Ser: 1.3 mg/dL (ref 0.50–1.35)
GFR calc non Af Amer: 46 mL/min — ABNORMAL LOW (ref >90)
GFR, EST AFRICAN AMERICAN: 54 mL/min — AB (ref >90)
GLUCOSE: 129 mg/dL — AB (ref 70–99)
Potassium: 4.2 mEq/L (ref 3.7–5.3)
SODIUM: 136 meq/L — AB (ref 137–147)
TOTAL PROTEIN: 7.4 g/dL (ref 6.0–8.3)
Total Bilirubin: 1.3 mg/dL — ABNORMAL HIGH (ref 0.3–1.2)

## 2013-12-22 LAB — ACETAMINOPHEN LEVEL: Acetaminophen (Tylenol), Serum: <15 ug/mL (ref 10–30)

## 2013-12-22 LAB — HEPATITIS PANEL, ACUTE
HCV AB: NEGATIVE
HEP B S AG: NEGATIVE
Hep A IgM: NONREACTIVE
Hep B C IgM: NONREACTIVE

## 2013-12-22 LAB — TROPONIN I

## 2013-12-22 LAB — LIPASE, BLOOD: Lipase: 62 U/L — ABNORMAL HIGH (ref 11–59)

## 2013-12-22 LAB — PROTIME-INR
INR: 1.1 (ref 0.00–1.49)
Prothrombin Time: 14.2 seconds (ref 11.6–15.2)

## 2013-12-22 MED ORDER — SODIUM CHLORIDE 0.9 % IV SOLN: INTRAVENOUS | Status: DC

## 2013-12-22 MED ORDER — LORAZEPAM 1 MG PO TABS: 1.0000 mg | ORAL_TABLET | Freq: Every evening | ORAL | Status: DC | PRN

## 2013-12-22 MED ORDER — ONDANSETRON HCL 4 MG PO TABS: 4.0000 mg | ORAL_TABLET | Freq: Four times a day (QID) | ORAL | Status: DC | PRN

## 2013-12-22 MED ORDER — ONDANSETRON HCL 4 MG/2ML IJ SOLN: 4.0000 mg | Freq: Four times a day (QID) | INTRAMUSCULAR | Status: DC | PRN

## 2013-12-22 MED ORDER — PANTOPRAZOLE SODIUM 40 MG PO TBEC
80.0000 mg | DELAYED_RELEASE_TABLET | Freq: Every day | ORAL | Status: DC
Start: 2013-12-22 — End: 2013-12-23
  Administered 2013-12-22 – 2013-12-23 (×2): 80 mg via ORAL
  Filled 2013-12-22 (×2): qty 2

## 2013-12-22 MED ORDER — PIPERACILLIN-TAZOBACTAM 3.375 G IVPB
3.3750 g | Freq: Three times a day (TID) | INTRAVENOUS | Status: DC
  Administered 2013-12-22 – 2013-12-23 (×4): 3.375 g via INTRAVENOUS
  Filled 2013-12-22 (×5): qty 50

## 2013-12-22 MED ORDER — DOCUSATE SODIUM 100 MG PO CAPS
100.0000 mg | ORAL_CAPSULE | Freq: Two times a day (BID) | ORAL | Status: DC
  Administered 2013-12-22 – 2013-12-23 (×2): 100 mg via ORAL
  Filled 2013-12-22 (×3): qty 1

## 2013-12-22 MED ORDER — POLYETHYLENE GLYCOL 3350 17 G PO PACK
17.0000 g | PACK | Freq: Every day | ORAL | Status: DC | PRN
  Filled 2013-12-22: qty 1

## 2013-12-22 MED ORDER — MORPHINE SULFATE 2 MG/ML IJ SOLN: 1.0000 mg | INTRAMUSCULAR | Status: DC | PRN

## 2013-12-22 MED ORDER — FLUDEOXYGLUCOSE F - 18 (FDG) INJECTION
5.5000 | Freq: Once | INTRAVENOUS | Status: AC | PRN
  Administered 2013-12-22: 5.5 via INTRAVENOUS

## 2013-12-22 MED ORDER — CITALOPRAM HYDROBROMIDE 20 MG PO TABS
20.0000 mg | ORAL_TABLET | Freq: Every day | ORAL | Status: DC
  Administered 2013-12-22: 20 mg via ORAL
  Filled 2013-12-22 (×2): qty 1

## 2013-12-22 MED ORDER — KCL IN DEXTROSE-NACL 20-5-0.45 MEQ/L-%-% IV SOLN
INTRAVENOUS | Status: DC
  Administered 2013-12-22: 1 mL via INTRAVENOUS
  Administered 2013-12-23: 01:00:00 via INTRAVENOUS
  Filled 2013-12-22 (×3): qty 1000

## 2013-12-22 MED ORDER — AZELASTINE HCL 0.1 % NA SOLN
2.0000 | Freq: Two times a day (BID) | NASAL | Status: DC
  Administered 2013-12-22 – 2013-12-23 (×3): 2 via NASAL
  Filled 2013-12-22: qty 30

## 2013-12-22 MED ORDER — FLUTICASONE PROPIONATE 50 MCG/ACT NA SUSP
1.0000 | Freq: Every day | NASAL | Status: DC
  Administered 2013-12-22 – 2013-12-23 (×2): 1 via NASAL
  Filled 2013-12-22: qty 16

## 2013-12-22 MED ORDER — INFLUENZA VAC SPLIT QUAD 0.5 ML IM SUSY
0.5000 mL | PREFILLED_SYRINGE | INTRAMUSCULAR | Status: DC
  Filled 2013-12-22 (×2): qty 0.5

## 2013-12-22 MED ORDER — BISACODYL 5 MG PO TBEC: 5.0000 mg | DELAYED_RELEASE_TABLET | Freq: Every day | ORAL | Status: DC | PRN

## 2013-12-22 MED ORDER — ONDANSETRON HCL 4 MG/2ML IJ SOLN: 4.0000 mg | Freq: Three times a day (TID) | INTRAMUSCULAR | Status: AC | PRN

## 2013-12-22 MED ORDER — TRAMADOL HCL 50 MG PO TABS
50.0000 mg | ORAL_TABLET | Freq: Once | ORAL | Status: AC
  Administered 2013-12-22: 50 mg via ORAL
  Filled 2013-12-22: qty 1

## 2013-12-22 MED ORDER — ONDANSETRON HCL 4 MG/2ML IJ SOLN
4.0000 mg | Freq: Once | INTRAMUSCULAR | Status: AC
  Administered 2013-12-22: 4 mg via INTRAVENOUS
  Filled 2013-12-22: qty 2

## 2013-12-22 MED ORDER — OXYCODONE HCL 5 MG PO TABS
5.0000 mg | ORAL_TABLET | ORAL | Status: DC | PRN
  Filled 2013-12-22: qty 1
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
## 2013-12-22 NOTE — H&P (Signed)
[facility] History and Physical  Allen Wheeler MLY:[id number] DOB: 11/13/1919 DOA: 12/22/2013  Referring physician:  Varney Biles PCP:  [name], MD   Chief Complaint:  Abdominal pain, nausea  HPI:  The patient is a 80 y.o. year-old male with history of nephrolithiasis, GERD, anxiety/depression who presents with abdominal pain and nausea.  The patient was last at their baseline health yesterday.  He went to lunch and ate lentil soup, flounder with fettuccini and a custard around 3PM.  He felt fine until around 8PM yesterday when he developed epigastric pressure with radiation to the shoulders associated with nausea.  He had two normal bowel movements and tried to make himself throw up to alleviate the pressure and nausea without relief.  He eventually called EMS when his nausea and pressure were not resolving and he developed rigors.  He had difficulty quantifying the level of pressure.  Denies associated lightheadedness, SOB.  He states for the last several months he has had some bloating and nausea after eating lunch and dinner.    In the ER, VSS, WBC 10.7, AST 716,ALT 429, bili 1.3, alk phos 140.  CXR/KUB with normal gas pattern and suggestion of atelectasis, otherwise normal.  Abd [place] with sludge and small stones within gallbladder with striation and edema of the gallbladder wall concerning for acute cholecystitis.  Exam initially concerning for cholecystitis, but pain subsequently resolved so being admitted for HIDA.    Review of Systems:  General:  Denies fevers, + chills HEENT:  Denies changes to hearing and vision, rhinorrhea, sinus congestion, sore throat CV:  Denies chest pain and palpitations, lower extremity edema.  PULM:  Denies SOB, wheezing, cough.   GI:  Per HPI.   GU:  Denies dysuria, frequency, urgency ENDO:  Denies polyuria, polydipsia.   HEME:  Denies hematemesis, blood in stools, melena, abnormal bruising or bleeding.  LYMPH:  Denies lymphadenopathy.   MSK:   Denies arthralgias, myalgias.   DERM:  Denies skin rash or ulcer.   NEURO:  Denies focal numbness, weakness, slurred speech, confusion, facial droop.  PSYCH:  Denies anxiety and depression.    Past Medical History  Diagnosis Date  . Nephrolithiasis   . Osteoarthritis   . Chickenpox   . GERD (gastroesophageal reflux disease)   . Anxiety   . Depression   . Allergy   . Hearing loss     wears hearing aids  . Normal cardiac stress test 09-20-12   Past Surgical History  Procedure Laterality Date  . Hernia repair      wears truss  . Mastoidectomy     Social History:  reports that he has quit smoking. He has never used smokeless tobacco. He reports that he drinks about 3.5 ounces of alcohol per week. He reports that he does not use illicit drugs. Lives alone and has emergency button if needed.  Active.  Does not use assist device.  Wife died about three years ago and he has a son who lives in [place]  No Known Allergies  Family History  Problem Relation Age of Onset  . Heart disease Mother     Pacemaker  . Colon cancer       Prior to Admission medications   Medication Sig Start Date End Date Taking? Authorizing Provider  azelastine (ASTELIN) 0.1 % nasal spray Place 2 sprays into both nostrils 2 (two) times daily. Use in each nostril as directed   Yes Historical Provider, MD  citalopram (CELEXA) 20 MG tablet Take 1 tablet (  20 mg total) by mouth daily. 10/28/13  Yes [name], MD  fluticasone (FLONASE) 50 MCG/ACT nasal spray Place 1 spray into both nostrils daily.   Yes Historical Provider, MD  LORazepam (ATIVAN) 1 MG tablet Take 1 mg by mouth at bedtime as needed for anxiety or sleep.   Yes Historical Provider, MD  omeprazole (PRILOSEC) 40 MG capsule Take 1 capsule (40 mg total) by mouth daily. 11/18/13  Yes [name], MD  ibuprofen (ADVIL,MOTRIN) 200 MG tablet Take 200 mg by mouth every 6 (six) hours as needed for pain.    Historical Provider, MD   Physical Exam: Filed  Vitals:   12/22/13 0112 12/22/13 0339 12/22/13 0705  BP: 159/63 130/58 120/54  Pulse: 74 77 76  Temp: 99.1 F (37.3 C) 98.5 F (36.9 C)   TempSrc: Oral Oral   Resp: _0 SpO2: 95% 95% 96%     General:  WM, NAD  Eyes:  PERRL, anicteric, non-injected.  ENT:  Nares clear.  OP clear, non-erythematous without plaques or exudates.  Dry MM and lips  Neck:  Supple without TM or JVD.    Lymph:  No cervical, supraclavicular, or submandibular LAD.  Cardiovascular:  RRR, normal S1, S2, without m/r/g.  2+ pulses, warm extremities  Respiratory:  CTA bilaterally without increased WOB.  Abdomen:  NABS.  Soft, ND/NT.  Negative Murphy's sign  Skin:  No rashes or focal lesions.  Musculoskeletal:  Normal bulk and tone.  No LE edema.  Psychiatric:  A & O x 4.  Appropriate affect.  Neurologic:  CN 3-12 intact.  5/5 strength.  Sensation intact.  Labs on Admission:  Basic Metabolic Panel:  Recent Labs Lab 12/22/13 0349  NA 136*  K 4.2  CL 99  CO2 22  GLUCOSE 129*  BUN 21  CREATININE 1.30  CALCIUM 9.1   Liver Function Tests:  Recent Labs Lab 12/22/13 0349  AST 716*  ALT 429*  ALKPHOS 140*  BILITOT 1.3*  PROT 7.4  ALBUMIN 3.5    Recent Labs Lab 12/22/13 0349  LIPASE 62*   No results found for this basename: AMMONIA,  in the last 168 hours CBC:  Recent Labs Lab 12/22/13 0349  WBC 10.7*  NEUTROABS 9.4*  HGB 14.2  HCT 39.9  MCV 92.1  PLT 151   Cardiac Enzymes:  Recent Labs Lab 12/22/13 0349  TROPONINI <0.30    BNP (last 3 results) No results found for this basename: PROBNP,  in the last 8760 hours CBG: No results found for this basename: GLUCAP,  in the last 168 hours  Radiological Exams on Admission: US Abdomen Limited  12/22/2013   CLINICAL DATA:  Right upper quadrant pain  EXAM: US ABDOMEN LIMITED - RIGHT UPPER QUADRANT  COMPARISON:  Prior abdominal ultrasound 09/21/2013  FINDINGS: Gallbladder:  Echogenic mobile foci in an sludge within the  gallbladder lumen consistent with cholelithiasis. The gallbladder wall is thickened and demonstrates striated shin consistent with edema in the wall. The wall thickness measures up to 6.9 mm. Per the sonographer, sonographic Percell Miller sign was negative.  Common bile duct:  Diameter: Within normal limits at 6.1 mm.  Liver:  No focal lesion identified. Within normal limits in parenchymal echogenicity. Patent with hepatopetal flow.  IMPRESSION: Sludge and small stones versus sludge balls within the gallbladder lumen. In combination with striation and thickening of the gallbladder wall, these findings are concerning for acute cholecystitis. If imaging confirmation of acute cholecystitis is warranted, consider nuclear  medicine HIDA scan.   Electronically Signed   By: Jacqulynn Cadet M.D.   On: 12/22/2013 07:42   Dg Abd Acute W/chest  12/22/2013   CLINICAL DATA:  Nausea, abdominal pain and abdominal pressure.  EXAM: ACUTE ABDOMEN SERIES (ABDOMEN 2 VIEW & CHEST 1 VIEW)  COMPARISON:  None.  FINDINGS: The lungs are well-aerated. Minimal bibasilar opacities likely reflect atelectasis. There is no evidence of pleural effusion or pneumothorax. The cardiomediastinal silhouette is within normal limits.  The visualized bowel gas pattern is unremarkable. Scattered stool and air are seen within the colon; there is no evidence of small bowel dilatation to suggest obstruction. The sigmoid colon appears mildly redundant. No free intra-abdominal air is identified on the provided upright view.  No acute osseous abnormalities are seen; the sacroiliac joints are unremarkable in appearance.  IMPRESSION: 1. Unremarkable bowel gas pattern; no free intra-abdominal air seen. 2. Minimal bibasilar opacities likely reflect atelectasis; lungs otherwise clear.   Electronically Signed   By: Garald Balding M.D.   On: 12/22/2013 02:14    EKG:  pending  Assessment/Plan Active Problems:   * No active hospital problems. *  ---  Transaminitis  and abdominal pain with nausea - may have acute cholecystitis vs. Passed a gallstone vs. Hepatitis -  NPO with IVF and antiemetics -  Gen Surg being contacted by ER MD -  HIDA scan  -  Trend LFTs  -  Hepatitis panel and tylenol level -  Minimize hepatotoxins  Depression/anxiety, stable, continue citalopram  GERD, stable, continue PPI  Allergic rhinitis, stable, continue nasal sprays  Diet:  NPO Access:  PIV IVF:  yes Proph:  SCDs   Code Status: Full Family Communication: patient alone Disposition Plan: Admit to med-surg  Time spent: 60 min Jaden Abreu [facility] Pager [phone]  If 7PM-7AM, please contact night-coverage [email or web address] Password [facility] 12/22/2013, 9:13 AM

## 2013-12-22 NOTE — ED Notes (Signed)
EMS called to home. Found patient with complaints of abdominal pain with nausea.   Patient states that nausea subsided en route to hospital.  Patient also states he had Been having loose stool with bloating.

## 2013-12-22 NOTE — ED Notes (Signed)
Report given to [facility] on 5E. Informed her that patient will go to Nuclear Medicine study first prior to coming up to floor.

## 2013-12-22 NOTE — Consult Note (Signed)
I have seen and examined the patient. The patient currently pain free.  He does not want to have any surgery at this time.  He states he is better and wants to go home with Zofran. Its possible the patient could have passed a gallstones vs. Hepatitis. Patient is refusing surgery at this time. Call with questions or if patient changes his mind

## 2013-12-22 NOTE — ED Provider Notes (Addendum)
CSN: [id number]     Arrival date & time 12/22/13  0110 History   First MD Initiated Contact with Patient 12/22/13 0304     Chief Complaint  Patient presents with  . Abdominal Pain     (Consider location/radiation/quality/duration/timing/severity/associated sxs/prior Treatment) HPI Comments: Pt comes in with cc of abd pain. Pt has hx of renal stones, allergies, OA, no other significant medical hx. Pt reports that he had some pressure like feeling to his abdomen, with nausea. Onset of symptoms - sometime after 8 p,m, but before 12 am. No emesis. Pt thought may be the food he ate didn't agree with him. Symptoms lasted for 2 hours or so, but now has subsided.  No chest pain, dib, cough, no back pain, no UTI like sx. + some back of the shoulder pain and neck pain.  Patient is a 80 y.o. male presenting with abdominal pain. The history is provided by the patient.  Abdominal Pain Associated symptoms: nausea   Associated symptoms: no chest pain, no cough, no dysuria, no shortness of breath and no vomiting     Past Medical History  Diagnosis Date  . Nephrolithiasis   . Osteoarthritis   . Chickenpox   . GERD (gastroesophageal reflux disease)   . Anxiety   . Depression   . Allergy   . Hearing loss     wears hearing aids  . Normal cardiac stress test 09-20-12   Past Surgical History  Procedure Laterality Date  . Hernia repair      wears truss  . Mastoidectomy     Family History  Problem Relation Age of Onset  . Heart disease Mother     Pacemaker  . Colon cancer     History  Substance Use Topics  . Smoking status: Former Research scientist (life sciences)  . Smokeless tobacco: Never Used  . Alcohol Use: 3.5 oz/week    7 drink(s) per week    Review of Systems  Constitutional: Negative for activity change and appetite change.  Respiratory: Negative for cough and shortness of breath.   Cardiovascular: Negative for chest pain.  Gastrointestinal: Positive for nausea and abdominal pain. Negative for  vomiting.  Genitourinary: Negative for dysuria.      Allergies  Review of patient's allergies indicates no known allergies.  Home Medications   Prior to Admission medications   Medication Sig Start Date End Date Taking? Authorizing Provider  azelastine (ASTELIN) 0.1 % nasal spray Place 2 sprays into both nostrils 2 (two) times daily. Use in each nostril as directed   Yes Historical Provider, MD  citalopram (CELEXA) 20 MG tablet Take 1 tablet (20 mg total) by mouth daily. 10/28/13  Yes [name], MD  fluticasone (FLONASE) 50 MCG/ACT nasal spray Place 1 spray into both nostrils daily.   Yes Historical Provider, MD  LORazepam (ATIVAN) 1 MG tablet Take 1 mg by mouth at bedtime as needed for anxiety or sleep.   Yes Historical Provider, MD  omeprazole (PRILOSEC) 40 MG capsule Take 1 capsule (40 mg total) by mouth daily. 11/18/13  Yes [name], MD  ibuprofen (ADVIL,MOTRIN) 200 MG tablet Take 200 mg by mouth every 6 (six) hours as needed for pain.    Historical Provider, MD   BP 120/54  Pulse 76  Temp(Src) 98.5 F (36.9 C) (Oral)  Resp 18  SpO2 96% Physical Exam  Nursing note and vitals reviewed. Constitutional: He is oriented to person, place, and time. He appears well-developed.  HENT:  Head: Normocephalic and atraumatic.  Eyes: Conjunctivae and EOM are normal. Pupils are equal, round, and reactive to light.  Neck: Normal range of motion. Neck supple.  Cardiovascular: Normal rate and regular rhythm.   Pulmonary/Chest: Effort normal and breath sounds normal.  Abdominal: Soft. Bowel sounds are normal. He exhibits no distension. There is no tenderness. There is no rebound and no guarding.  Neurological: He is alert and oriented to person, place, and time.  Skin: Skin is warm.    ED Course  Procedures (including critical care time) Labs Review Labs Reviewed  CBC WITH DIFFERENTIAL - Abnormal; Notable for the following:    WBC 10.7 (*)    Neutrophils Relative % 88 (*)     Neutro Abs 9.4 (*)    Lymphocytes Relative 6 (*)    All other components within normal limits  COMPREHENSIVE METABOLIC PANEL - Abnormal; Notable for the following:    Sodium 136 (*)    Glucose, Bld 129 (*)    AST 716 (*)    ALT 429 (*)    Alkaline Phosphatase 140 (*)    Total Bilirubin 1.3 (*)    GFR calc non Af Amer 46 (*)    GFR calc Af Amer 54 (*)    All other components within normal limits  LIPASE, BLOOD - Abnormal; Notable for the following:    Lipase 62 (*)    All other components within normal limits  TROPONIN I  URINALYSIS, ROUTINE W REFLEX MICROSCOPIC  APTT  PROTIME-INR    Imaging Review US Abdomen Limited  12/22/2013   CLINICAL DATA:  Right upper quadrant pain  EXAM: US ABDOMEN LIMITED - RIGHT UPPER QUADRANT  COMPARISON:  Prior abdominal ultrasound 09/21/2013  FINDINGS: Gallbladder:  Echogenic mobile foci in an sludge within the gallbladder lumen consistent with cholelithiasis. The gallbladder wall is thickened and demonstrates striated shin consistent with edema in the wall. The wall thickness measures up to 6.9 mm. Per the sonographer, sonographic Percell Miller sign was negative.  Common bile duct:  Diameter: Within normal limits at 6.1 mm.  Liver:  No focal lesion identified. Within normal limits in parenchymal echogenicity. Patent with hepatopetal flow.  IMPRESSION: Sludge and small stones versus sludge balls within the gallbladder lumen. In combination with striation and thickening of the gallbladder wall, these findings are concerning for acute cholecystitis. If imaging confirmation of acute cholecystitis is warranted, consider nuclear medicine HIDA scan.   Electronically Signed   By: Jacqulynn Cadet M.D.   On: 12/22/2013 07:42   Dg Abd Acute W/chest  12/22/2013   CLINICAL DATA:  Nausea, abdominal pain and abdominal pressure.  EXAM: ACUTE ABDOMEN SERIES (ABDOMEN 2 VIEW & CHEST 1 VIEW)  COMPARISON:  None.  FINDINGS: The lungs are well-aerated. Minimal bibasilar opacities likely  reflect atelectasis. There is no evidence of pleural effusion or pneumothorax. The cardiomediastinal silhouette is within normal limits.  The visualized bowel gas pattern is unremarkable. Scattered stool and air are seen within the colon; there is no evidence of small bowel dilatation to suggest obstruction. The sigmoid colon appears mildly redundant. No free intra-abdominal air is identified on the provided upright view.  No acute osseous abnormalities are seen; the sacroiliac joints are unremarkable in appearance.  IMPRESSION: 1. Unremarkable bowel gas pattern; no free intra-abdominal air seen. 2. Minimal bibasilar opacities likely reflect atelectasis; lungs otherwise clear.   Electronically Signed   By: Garald Balding M.D.   On: 12/22/2013 02:14     EKG Interpretation None      MDM  Final diagnoses:  Elevated liver enzymes  Calculus of gallbladder with acute cholecystitis and obstruction    Pt comes in with cc of abd pain. Pt feeling better by the time i saw him, however, he does have mild RUQ tenderness on exam. Labs were ordered, and there is elevated LFTs. [place] ordered, and the results are equivocal - with reassessment showing no abd tenderness at all.  Suspect that pt has possible choledocholithiasis, unlikely biliary dyskinesia.  Dr. Sheran Fava, Medicine will admit. She will order HIDA scan. Surgery consulted, awaiting call back.  8:44 AM Gen surgery will evaluate the patient soon.   Varney Biles, MD 12/22/13 [place], MD 12/22/13 [phone]

## 2013-12-23 LAB — COMPREHENSIVE METABOLIC PANEL
ALT: 328 U/L — ABNORMAL HIGH (ref 0–53)
ANION GAP: 10 (ref 5–15)
AST: 233 U/L — ABNORMAL HIGH (ref 0–37)
Albumin: 3 g/dL — ABNORMAL LOW (ref 3.5–5.2)
Alkaline Phosphatase: 117 U/L (ref 39–117)
BUN: 16 mg/dL (ref 6–23)
CALCIUM: 8.4 mg/dL (ref 8.4–10.5)
CO2: 21 meq/L (ref 19–32)
CREATININE: 1.47 mg/dL — AB (ref 0.50–1.35)
Chloride: 106 mEq/L (ref 96–112)
GFR, EST AFRICAN AMERICAN: 46 mL/min — AB (ref >90)
GFR, EST NON AFRICAN AMERICAN: 40 mL/min — AB (ref >90)
GLUCOSE: 116 mg/dL — AB (ref 70–99)
Potassium: 3.8 mEq/L (ref 3.7–5.3)
Sodium: 137 mEq/L (ref 137–147)
TOTAL PROTEIN: 6.8 g/dL (ref 6.0–8.3)
Total Bilirubin: 1.6 mg/dL — ABNORMAL HIGH (ref 0.3–1.2)

## 2013-12-23 LAB — CBC
HCT: 39.6 % (ref 39.0–52.0)
HEMOGLOBIN: 13.4 g/dL (ref 13.0–17.0)
MCH: 32.2 pg (ref 26.0–34.0)
MCHC: 33.8 g/dL (ref 30.0–36.0)
MCV: 95.2 fL (ref 78.0–100.0)
PLATELETS: 128 10*3/uL — AB (ref 150–400)
RBC: 4.16 MIL/uL — AB (ref 4.22–5.81)
RDW: 13.2 % (ref 11.5–15.5)
WBC: 6.2 10*3/uL (ref 4.0–10.5)

## 2013-12-23 LAB — LIPASE, BLOOD: LIPASE: 32 U/L (ref 11–59)

## 2013-12-23 LAB — PROTIME-INR
INR: 1.24 (ref 0.00–1.49)
PROTHROMBIN TIME: 15.6 s — AB (ref 11.6–15.2)

## 2013-12-23 MED ORDER — ONDANSETRON HCL 4 MG PO TABS: 4.0000 mg | ORAL_TABLET | Freq: Four times a day (QID) | ORAL | Status: DC | PRN

## 2013-12-23 MED ORDER — LORAZEPAM 1 MG PO TABS: 1.0000 mg | ORAL_TABLET | Freq: Every evening | ORAL | Status: DC | PRN

## 2013-12-23 NOTE — Progress Notes (Signed)
Patient ID: Allen Wheeler, male   DOB: [birth year], 80 y.o.   MRN: [id number]    Subjective: Pt feels well.  He's hungry.  Has no further pain.  Wants to eat and go home.  Objective: Vital signs in last 24 hours: Temp:  [97.7 F (36.5 C)-99.2 F (37.3 C)] 98.1 F (36.7 C) (09/16 0530) Pulse Rate:  [56-70] 56 (09/16 0530) Resp:  [16-18] 16 (09/16 0530) BP: (118-149)/(50-86) 118/52 mmHg (09/16 0530) SpO2:  [93 %-95 %] 93 % (09/15 2109) Weight:  [163 lb 9.3 oz (74.2 kg)] 163 lb 9.3 oz (74.2 kg) (09/15 1108) Last BM Date: 12/21/13  Intake/Output from previous day: 09/15 0701 - 09/16 0700 In: 2014.8 [I.V.:1842.8; IV Piggyback:172] Out: -  Intake/Output this shift:    PE: Abd: soft, NT, ND, +BS  Lab Results:   Recent Labs  12/22/13 0349 12/23/13 0500  WBC 10.7* 6.2  HGB 14.2 13.4  HCT 39.9 39.6  PLT 151 128*   BMET  Recent Labs  12/22/13 0349 12/23/13 0500  NA 136* 137  K 4.2 3.8  CL 99 106  CO2 22 21  GLUCOSE 129* 116*  BUN 21 16  CREATININE 1.30 1.47*  CALCIUM 9.1 8.4   PT/INR  Recent Labs  12/22/13 0449 12/23/13 0500  LABPROT 14.2 15.6*  INR 1.10 1.24   CMP     Component Value Date/Time   NA 137 12/23/2013 0500   K 3.8 12/23/2013 0500   CL 106 12/23/2013 0500   CO2 21 12/23/2013 0500   GLUCOSE 116* 12/23/2013 0500   BUN 16 12/23/2013 0500   CREATININE 1.47* 12/23/2013 0500   CALCIUM 8.4 12/23/2013 0500   PROT 6.8 12/23/2013 0500   ALBUMIN 3.0* 12/23/2013 0500   AST 233* 12/23/2013 0500   ALT 328* 12/23/2013 0500   ALKPHOS 117 12/23/2013 0500   BILITOT 1.6* 12/23/2013 0500   GFRNONAA 40* 12/23/2013 0500   GFRAA 46* 12/23/2013 0500   Lipase     Component Value Date/Time   LIPASE 32 12/23/2013 0500       Studies/Results: Nm Hepatobiliary Liver Func  12/22/2013   CLINICAL DATA:  Abdominal pain with gallbladder wall thickening and gallstones on ultrasound.  EXAM: NUCLEAR MEDICINE HEPATOBILIARY IMAGING  TECHNIQUE: Sequential images of the abdomen  were obtained out to 60 minutes following intravenous administration of radiopharmaceutical.  RADIOPHARMACEUTICALS:  5.5 Millic[employer]ie OL-[id number] Choletec  COMPARISON:  Abdominal ultrasound today and 09/21/2013.  FINDINGS: Initial images demonstrate homogeneous hepatic activity. There is spontaneous opacification of the biliary system and duodenum. There is a persistent collection of activity in the subhepatic space which is position anteriorly on the lateral view, corresponding with the location of the gallbladder on ultrasound. There is no evidence of extravasation of the radiopharmaceutical.  IMPRESSION: Focal activity in the subhepatic space appears to correspond with the location of the gallbladder on prior ultrasound and implies patency of the cystic duct. The patient was reported to have a negative sonographic Murphy's sign on prior ultrasound.   Electronically Signed   By: Camie Patience M.D.   On: 12/22/2013 11:24   US Abdomen Limited  12/22/2013   CLINICAL DATA:  Right upper quadrant pain  EXAM: US ABDOMEN LIMITED - RIGHT UPPER QUADRANT  COMPARISON:  Prior abdominal ultrasound 09/21/2013  FINDINGS: Gallbladder:  Echogenic mobile foci in an sludge within the gallbladder lumen consistent with cholelithiasis. The gallbladder wall is thickened and demonstrates striated shin consistent with edema in the wall. The wall thickness measures up to  6.9 mm. Per the sonographer, sonographic Percell Miller sign was negative.  Common bile duct:  Diameter: Within normal limits at 6.1 mm.  Liver:  No focal lesion identified. Within normal limits in parenchymal echogenicity. Patent with hepatopetal flow.  IMPRESSION: Sludge and small stones versus sludge balls within the gallbladder lumen. In combination with striation and thickening of the gallbladder wall, these findings are concerning for acute cholecystitis. If imaging confirmation of acute cholecystitis is warranted, consider nuclear medicine HIDA scan.   Electronically Signed   By:  Jacqulynn Cadet M.D.   On: 12/22/2013 07:42   Dg Abd Acute W/chest  12/22/2013   CLINICAL DATA:  Nausea, abdominal pain and abdominal pressure.  EXAM: ACUTE ABDOMEN SERIES (ABDOMEN 2 VIEW & CHEST 1 VIEW)  COMPARISON:  None.  FINDINGS: The lungs are well-aerated. Minimal bibasilar opacities likely reflect atelectasis. There is no evidence of pleural effusion or pneumothorax. The cardiomediastinal silhouette is within normal limits.  The visualized bowel gas pattern is unremarkable. Scattered stool and air are seen within the colon; there is no evidence of small bowel dilatation to suggest obstruction. The sigmoid colon appears mildly redundant. No free intra-abdominal air is identified on the provided upright view.  No acute osseous abnormalities are seen; the sacroiliac joints are unremarkable in appearance.  IMPRESSION: 1. Unremarkable bowel gas pattern; no free intra-abdominal air seen. 2. Minimal bibasilar opacities likely reflect atelectasis; lungs otherwise clear.   Electronically Signed   By: Garald Balding M.D.   On: 12/22/2013 02:14    Anti-infectives: Anti-infectives   Start     Dose/Rate Route Frequency Ordered Stop   12/22/13 1100  piperacillin-tazobactam (ZOSYN) IVPB 3.375 g     3.375 g 12.5 mL/hr over 240 Minutes Intravenous Every 8 hours 12/22/13 1020         Assessment/Plan  1. Cholelithiasis 2. Elevated transaminases  Plan: 1. Patient's HIDA is normal and hep panel is negative.  Suspect he passed a gallstone.  It is ok to let him eat and go home from our standpoint.  The patient does NOT want an operation.  We will sign off.   LOS: 1 day    Dravyn Severs E 12/23/2013, 8:42 AM Pager: [phone]

## 2013-12-23 NOTE — Discharge Instructions (Signed)
Liver Profile A liver profile is a battery of tests which helps your caregiver evaluate your liver function. The following tests are often included in the liver profile: Alanine aminotransferase (ALT or SGPT) This is an enzyme found primarily in the liver. Abnormalities may represent liver disease. This is found in cells of the liver so when it is elevated, it has been released by damaged cells. Albumin - The serum albumin is one of the major proteins in the blood and a reflection of the general state of nutrition. This is low when the liver is unable to do its job. It is also low when protein is lost in the urine. NORMAL FINDINGS Adult/Elderly  Total protein: 6.4-8.3 g/dL or 64-83 g/L (SI units)  Albumin: 3.5-5 g/dL or 35-50 g/L (SI units)  Globulin: 2.3-3.4 g/dL  Alpha1 globulin: 0.1-0.3 g/dL or 1-3 g/L (SI units)  Alpha2 globulin: 0.6-1 g/dL or 6-10 g/L (SI units)  Beta globulin: 0.7-1.1 g/dL or 7-11 g/L (SI units) Children  Total protein  Premature infant: 4.2-7.6 g/dL  Newborn: 4.6-7.4 g/dL  Infant: 6-6.7 g/dL  Child: 6.2-8 g/dL  Albumin  Premature infant: 3-4.2 g/dL  Newborn: 3.5-5.4 g/dL  Infant: 4.4-5.4 g/dL  Child: 4-5.9 g/dL Albumin/Globulin ratio - Calculated by dividing the albumin by the globulin. It is a measure of well being.  Alkaline phosphatase - This is an enzyme which is important in diagnosing proper bone and liver functions. NORMAL FINDINGS Age / Normal Value (units/L)  0-5 days / 35-140  Less than 3 yr / 15-60  3-6 yr / 15-50  6-12 yr / 10-50  12-18 yr / 10-40  Adult / 0-35 units/L or 0-0.58 microKat/L (SI Units) (Females tend to have slightly lower levels than males)  Elderly / Slightly higher than adults Aspartate aminotransferase (AST or SGOT) - an enzyme found in skeletal and heart muscle, liver and other organs. Abnormalities may represent liver disease. This is found in cells of the liver so when it is elevated, it has been  released by damaged cells. Bilirubin, Total: A chemical involved with liver functions. High concentrations may result in jaundice. Jaundice is a yellowing of the skin and the whites of the eyes. NORMAL FINDINGS Blood  Adult/elderly/child  Total bilirubin: 0.3-1.0 mg/dL or 5.1-17 micromole/L (SI units)  Indirect bilirubin: 0.2-0.8 mg/dL or 3.4-12.0 micromole/L (SI units)  Direct bilirubin: 0.1-0.3 mg/dL or 1.7-5.1 micromole/L (SI units)  Newborn total bilirubin: 1.0-12.0 mg/dL or 17.1-205 micromole/L (SI units)  Urine0-0.02 mg/dL Ranges for normal findings may vary among different laboratories and hospitals. You should always check with your doctor after having lab work or other tests done to discuss the meaning of your test results and whether your values are considered within normal limits PREPARATION FOR TEST No preparation or fasting is necessary unless you have been informed otherwise. A blood sample is obtained by inserting a needle into a vein in the arm. MEANING OF TEST  Your caregiver will go over the test results with you and discuss the importance and meaning of your results, as well as treatment options and the need for additional tests if necessary. OBTAINING THE TEST RESULTS It is your responsibility to obtain your test results. Ask the lab or department performing the test when and how you will get your results. Document Released: 04/28/2004 Document Revised: 06/18/2011 Document Reviewed: 07/28/2013 [employer] Patient Information 2015 [employer]. This information is not intended to replace advice given to you by your health care provider. Make sure you discuss any questions   you have with your health care provider.  

## 2013-12-23 NOTE — Discharge Summary (Signed)
Physician Discharge Summary  Creighton Longley [id number] DOB: 13-Mar-1920 DOA: 12/22/2013  PCP: [name], MD  Admit date: 12/22/2013 Discharge date: 12/23/2013  Recommendations for Outpatient Follow-up:  1. Pt will need to follow up with PCP in 2-3 weeks post discharge 2. Please obtain CMP to evaluate electrolytes and kidney function 3. Please also check CBC to evaluate Hg and Hct levels  Discharge Diagnoses:  Active Problems: Transaminitis   Discharge Condition: Stable  Diet recommendation: Heart healthy diet discussed in details   History of present illness:  80 y.o. year-old male with history of nephrolithiasis, GERD, anxiety/depression who presents with abdominal pain and nausea. The patient was last at their baseline health one day PTA. He went to lunch and ate lentil soup, flounder with fettuccini and a custard around 3PM. He felt fine until around 8PM one day PTA when he developed epigastric pressure with radiation to the shoulders associated with nausea. He had two normal bowel movements and tried to make himself throw up to alleviate the pressure and nausea without relief. He eventually called EMS when his nausea and pressure were not resolving and he developed rigors. He had difficulty quantifying the level of pressure. Denied associated lightheadedness, SOB. He stated for the last several months he has had some bloating and nausea after eating lunch and dinner.   In the ER, VSS, WBC 10.7, AST 716,ALT 429, bili 1.3, alk phos 140. CXR/KUB with normal gas pattern and suggestion of atelectasis, otherwise normal. Abd [place] with sludge and small stones within gallbladder with striation and edema of the gallbladder wall concerning for acute cholecystitis. Exam initially concerning for cholecystitis, but pain subsequently resolved so being admitted for HIDA.    Hospital Course:  Active Problems: Cholelithiasis  Elevated transaminases  - pt clinically stable this AM and wants to go  home - tolerating clear liquid diet well - Patient's HIDA is normal and hep panel is negative.  - Suspect he passed a gallstone - The patient does NOT want an operation - LFT's trending down and lipase is WNL - continue analgesia and antiemetics as needed   Procedures/Studies: Nm Hepatobiliary Liver Func  12/22/2013   CLINICAL DATA:  Abdominal pain with gallbladder wall thickening and gallstones on ultrasound.  EXAM: NUCLEAR MEDICINE HEPATOBILIARY IMAGING  TECHNIQUE: Sequential images of the abdomen were obtained out to 60 minutes following intravenous administration of radiopharmaceutical.  RADIOPHARMACEUTICALS:  5.5 Millicurie KD-32I Choletec  COMPARISON:  Abdominal ultrasound today and 09/21/2013.  FINDINGS: Initial images demonstrate homogeneous hepatic activity. There is spontaneous opacification of the biliary system and duodenum. There is a persistent collection of activity in the subhepatic space which is position anteriorly on the lateral view, corresponding with the location of the gallbladder on ultrasound. There is no evidence of extravasation of the radiopharmaceutical.  IMPRESSION: Focal activity in the subhepatic space appears to correspond with the location of the gallbladder on prior ultrasound and implies patency of the cystic duct. The patient was reported to have a negative sonographic Murphy's sign on prior ultrasound.   Electronically Signed   By: Camie Patience M.D.   On: 12/22/2013 11:24   US Abdomen Limited  12/22/2013   CLINICAL DATA:  Right upper quadrant pain  EXAM: US ABDOMEN LIMITED - RIGHT UPPER QUADRANT  COMPARISON:  Prior abdominal ultrasound 09/21/2013  FINDINGS: Gallbladder:  Echogenic mobile foci in an sludge within the gallbladder lumen consistent with cholelithiasis. The gallbladder wall is thickened and demonstrates striated shin consistent with edema in the wall. The wall  thickness measures up to 6.9 mm. Per the sonographer, sonographic Percell Miller sign was negative.   Common bile duct:  Diameter: Within normal limits at 6.1 mm.  Liver:  No focal lesion identified. Within normal limits in parenchymal echogenicity. Patent with hepatopetal flow.  IMPRESSION: Sludge and small stones versus sludge balls within the gallbladder lumen. In combination with striation and thickening of the gallbladder wall, these findings are concerning for acute cholecystitis. If imaging confirmation of acute cholecystitis is warranted, consider nuclear medicine HIDA scan.   Electronically Signed   By: Jacqulynn Cadet M.D.   On: 12/22/2013 07:42   Dg Abd Acute W/chest  12/22/2013   CLINICAL DATA:  Nausea, abdominal pain and abdominal pressure.  EXAM: ACUTE ABDOMEN SERIES (ABDOMEN 2 VIEW & CHEST 1 VIEW)  COMPARISON:  None.  FINDINGS: The lungs are well-aerated. Minimal bibasilar opacities likely reflect atelectasis. There is no evidence of pleural effusion or pneumothorax. The cardiomediastinal silhouette is within normal limits.  The visualized bowel gas pattern is unremarkable. Scattered stool and air are seen within the colon; there is no evidence of small bowel dilatation to suggest obstruction. The sigmoid colon appears mildly redundant. No free intra-abdominal air is identified on the provided upright view.  No acute osseous abnormalities are seen; the sacroiliac joints are unremarkable in appearance.  IMPRESSION: 1. Unremarkable bowel gas pattern; no free intra-abdominal air seen. 2. Minimal bibasilar opacities likely reflect atelectasis; lungs otherwise clear.   Electronically Signed   By: Garald Balding M.D.   On: 12/22/2013 02:14  Consultations:  Surgery   Antibiotics:  None  Discharge Exam: Filed Vitals:   12/23/13 0530  BP: 118/52  Pulse: 56  Temp: 98.1 F (36.7 C)  Resp: 16   Filed Vitals:   12/22/13 1108 12/22/13 1430 12/22/13 2109 12/23/13 0530  BP: 149/86 129/50 131/58 118/52  Pulse: 70 64 62 56  Temp: 98.3 F (36.8 C) 97.7 F (36.5 C) 99.2 F (37.3 C) 98.1 F  (36.7 C)  TempSrc: Oral Oral Oral Oral  Resp: 18 18 18 16   Height: 5' 6"  (1.676 m)     Weight: 74.2 kg (163 lb 9.3 oz)     SpO2: 94% 95% 93%     General: Pt is alert, follows commands appropriately, not in acute distress Cardiovascular: Regular rate and rhythm, no rubs, no gallops Respiratory: Clear to auscultation bilaterally, no wheezing, no crackles, no rhonchi Abdominal: Soft, non tender, non distended, bowel sounds +, no guarding Extremities: no edema, no cyanosis, pulses palpable bilaterally DP and PT Neuro: Grossly nonfocal  Discharge Instructions  Discharge Instructions   Diet - low sodium heart healthy    Complete by:  As directed      Increase activity slowly    Complete by:  As directed             Medication List         azelastine 0.1 % nasal spray  Commonly known as:  ASTELIN  Place 2 sprays into both nostrils 2 (two) times daily. Use in each nostril as directed     citalopram 20 MG tablet  Commonly known as:  CELEXA  Take 1 tablet (20 mg total) by mouth daily.     fluticasone 50 MCG/ACT nasal spray  Commonly known as:  FLONASE  Place 1 spray into both nostrils daily.     ibuprofen 200 MG tablet  Commonly known as:  ADVIL,MOTRIN  Take 200 mg by mouth every 6 (six) hours as needed for  pain.     LORazepam 1 MG tablet  Commonly known as:  ATIVAN  Take 1 tablet (1 mg total) by mouth at bedtime as needed for anxiety or sleep.     omeprazole 40 MG capsule  Commonly known as:  PRILOSEC  Take 1 capsule (40 mg total) by mouth daily.     ondansetron 4 MG tablet  Commonly known as:  ZOFRAN  Take 1 tablet (4 mg total) by mouth every 6 (six) hours as needed for nausea.           Follow-up Information   Follow up with Reyes Ivan, MD. (As needed)    Specialty:  General Surgery   Contact information:   [address] [address] [phone]       Follow up with [name], MD On 12/28/2013. (appointment for blood work  and follow up scheduled Sept 21, 2015 at 8:30 am )    Specialty:  Family Medicine   Contact information:   [place] [address] [phone]       Follow up with Faye Ramsay, MD. (call my cell phone [phone])    Specialty:  Internal Medicine   Contact information:   [address] [place] [address] [phone]        The results of significant diagnostics from this hospitalization (including imaging, microbiology, ancillary and laboratory) are listed below for reference.     Microbiology: No results found for this or any previous visit (from the past 240 hour(s)).   Labs: Basic Metabolic Panel:  Recent Labs Lab 12/22/13 0349 12/23/13 0500  NA 136* 137  K 4.2 3.8  CL 99 106  CO2 22 21  GLUCOSE 129* 116*  BUN 21 16  CREATININE 1.30 1.47*  CALCIUM 9.1 8.4   Liver Function Tests:  Recent Labs Lab 12/22/13 0349 12/23/13 0500  AST 716* 233*  ALT 429* 328*  ALKPHOS 140* 117  BILITOT 1.3* 1.6*  PROT 7.4 6.8  ALBUMIN 3.5 3.0*    Recent Labs Lab 12/22/13 0349 12/23/13 0500  LIPASE 62* 32   No results found for this basename: AMMONIA,  in the last 168 hours CBC:  Recent Labs Lab 12/22/13 0349 12/23/13 0500  WBC 10.7* 6.2  NEUTROABS 9.4*  --   HGB 14.2 13.4  HCT 39.9 39.6  MCV 92.1 95.2  PLT 151 128*   Cardiac Enzymes:  Recent Labs Lab 12/22/13 0349  TROPONINI <0.30   BNP: BNP (last 3 results) No results found for this basename: PROBNP,  in the last 8760 hours CBG: No results found for this basename: GLUCAP,  in the last 168 hours   SIGNED: Time coordinating discharge: Over 30 minutes  Faye Ramsay, MD  [facility] 12/23/2013, 10:54 AM Pager [phone]  If 7PM-7AM, please contact night-coverage [email or web address] Password [email or web address]

## 2013-12-23 NOTE — Progress Notes (Signed)
Discharge instructions and medications reviewed with patient. Patient verbalizes understanding and has no questions at this time. Patient verbalizes all personal belongings are in his possession. Patient d/c home.

## 2013-12-28 ENCOUNTER — Encounter: Payer: Self-pay | Admitting: Family Medicine

## 2013-12-28 ENCOUNTER — Ambulatory Visit (INDEPENDENT_AMBULATORY_CARE_PROVIDER_SITE_OTHER): Payer: Medicare Other | Admitting: Family Medicine

## 2013-12-28 VITALS — BP 132/77 | HR 64 | Temp 97.9°F | Ht 66.0 in | Wt 162.0 lb

## 2013-12-28 DIAGNOSIS — K810 Acute cholecystitis: Secondary | ICD-10-CM

## 2013-12-28 DIAGNOSIS — F3289 Other specified depressive episodes: Secondary | ICD-10-CM | POA: Diagnosis not present

## 2013-12-28 DIAGNOSIS — F411 Generalized anxiety disorder: Secondary | ICD-10-CM | POA: Diagnosis not present

## 2013-12-28 DIAGNOSIS — F329 Major depressive disorder, single episode, unspecified: Secondary | ICD-10-CM

## 2013-12-28 LAB — CBC WITH DIFFERENTIAL/PLATELET
BASOS ABS: 0 10*3/uL (ref 0.0–0.1)
BASOS PCT: 0.3 % (ref 0.0–3.0)
Eosinophils Absolute: 0.2 10*3/uL (ref 0.0–0.7)
Eosinophils Relative: 2.9 % (ref 0.0–5.0)
HCT: 43.4 % (ref 39.0–52.0)
HEMOGLOBIN: 14.8 g/dL (ref 13.0–17.0)
LYMPHS PCT: 32.6 % (ref 12.0–46.0)
Lymphs Abs: 2 10*3/uL (ref 0.7–4.0)
MCHC: 34.2 g/dL (ref 30.0–36.0)
MCV: 95.9 fl (ref 78.0–100.0)
MONO ABS: 0.6 10*3/uL (ref 0.1–1.0)
Monocytes Relative: 9.3 % (ref 3.0–12.0)
NEUTROS ABS: 3.4 10*3/uL (ref 1.4–7.7)
NEUTROS PCT: 54.9 % (ref 43.0–77.0)
Platelets: 224 10*3/uL (ref 150.0–400.0)
RBC: 4.52 Mil/uL (ref 4.22–5.81)
RDW: 13.4 % (ref 11.5–15.5)
WBC: 6.2 10*3/uL (ref 4.0–10.5)

## 2013-12-28 LAB — HEPATIC FUNCTION PANEL
ALT: 78 U/L — ABNORMAL HIGH (ref 0–53)
AST: 30 U/L (ref 0–37)
Albumin: 3.7 g/dL (ref 3.5–5.2)
Alkaline Phosphatase: 111 U/L (ref 39–117)
BILIRUBIN DIRECT: 0.1 mg/dL (ref 0.0–0.3)
BILIRUBIN TOTAL: 0.9 mg/dL (ref 0.2–1.2)
Total Protein: 8.1 g/dL (ref 6.0–8.3)

## 2013-12-28 LAB — BASIC METABOLIC PANEL
BUN: 18 mg/dL (ref 6–23)
CHLORIDE: 108 meq/L (ref 96–112)
CO2: 24 mEq/L (ref 19–32)
CREATININE: 1.4 mg/dL (ref 0.4–1.5)
Calcium: 9.3 mg/dL (ref 8.4–10.5)
GFR: 50.38 mL/min — AB (ref >60.00)
Glucose, Bld: 95 mg/dL (ref 70–99)
Potassium: 4.2 mEq/L (ref 3.5–5.1)
Sodium: 138 mEq/L (ref 135–145)

## 2013-12-28 MED ORDER — CITALOPRAM HYDROBROMIDE 10 MG PO TABS: 10.0000 mg | ORAL_TABLET | Freq: Every day | ORAL | Status: DC

## 2013-12-28 NOTE — Progress Notes (Signed)
   Subjective:    Patient ID: Allen Wheeler, male    DOB: [birth year], 80 y.o.   MRN: [id number]  HPI Here to follow up a hospital stay from 9-15-1 to 12-23-13 for transient elevations of liver transaminases due to gall bladder sludge. He presented with epigastric pain and nausea but no fever. His admission AST was 716 and ALT was 429 with normal pancreas enzymes and normal WBC. He was made NPO and given IV fluids and he quickly felt better. His abdominal US showed sludge but his HIDA scan was normal. He was then able to take broth by mouth and was sent home. Since then he has advanced his diet to full soups and even some fish and some chicken with no problems. No nausea or pain. He had a normal BM yesterday. Also he complains of daytime sleepiness and wonders if this could be partly due to medication side effects. He has been taking Celexa 20 mg daily for several years and he thinks this can be reduced. His moods are stable and he sleeps well at night.    Review of Systems  Constitutional: Negative.   Respiratory: Negative.   Cardiovascular: Negative.   Gastrointestinal: Negative.        Objective:   Physical Exam  Constitutional: He appears well-developed and well-nourished.  Eyes: No scleral icterus.  Cardiovascular: Normal rate, regular rhythm, normal heart sounds and intact distal pulses.   Pulmonary/Chest: Effort normal and breath sounds normal.  Abdominal: Soft. Bowel sounds are normal. He exhibits no distension and no mass. There is no tenderness. There is no rebound and no guarding.          Assessment & Plan:  He seems to be back to baseline. Get labs today. Reduce Celexa to 10 mg daily.

## 2013-12-28 NOTE — Progress Notes (Signed)
Pre visit review using our clinic review tool, if applicable. No additional management support is needed unless otherwise documented below in the visit note. 

## 2014-01-04 ENCOUNTER — Other Ambulatory Visit: Payer: Self-pay | Admitting: Family Medicine

## 2014-01-14 DIAGNOSIS — H7200 Central perforation of tympanic membrane, unspecified ear: Secondary | ICD-10-CM | POA: Diagnosis not present

## 2014-01-14 DIAGNOSIS — H908 Mixed conductive and sensorineural hearing loss, unspecified: Secondary | ICD-10-CM | POA: Diagnosis not present

## 2014-01-14 DIAGNOSIS — H9210 Otorrhea, unspecified ear: Secondary | ICD-10-CM | POA: Diagnosis not present

## 2014-01-14 DIAGNOSIS — H628X3 Other disorders of external ear in diseases classified elsewhere, bilateral: Secondary | ICD-10-CM | POA: Diagnosis not present

## 2014-02-11 DIAGNOSIS — H2513 Age-related nuclear cataract, bilateral: Secondary | ICD-10-CM | POA: Diagnosis not present

## 2014-02-24 ENCOUNTER — Encounter: Payer: Self-pay | Admitting: Family Medicine

## 2014-02-24 ENCOUNTER — Ambulatory Visit (INDEPENDENT_AMBULATORY_CARE_PROVIDER_SITE_OTHER): Payer: Medicare Other | Admitting: Family Medicine

## 2014-02-24 VITALS — BP 135/77 | HR 66 | Temp 97.5°F | Ht 66.0 in | Wt 168.0 lb

## 2014-02-24 DIAGNOSIS — L309 Dermatitis, unspecified: Secondary | ICD-10-CM

## 2014-02-24 MED ORDER — METHYLPREDNISOLONE ACETATE 80 MG/ML IJ SUSP
120.0000 mg | Freq: Once | INTRAMUSCULAR | Status: AC
  Administered 2014-02-24: 120 mg via INTRAMUSCULAR

## 2014-02-24 NOTE — Addendum Note (Signed)
Addended by: Aggie Hacker A on: 02/24/2014 04:11 PM   Modules accepted: Orders

## 2014-02-24 NOTE — Progress Notes (Signed)
Pre visit review using our clinic review tool, if applicable. No additional management support is needed unless otherwise documented below in the visit note. 

## 2014-02-24 NOTE — Progress Notes (Signed)
   Subjective:    Patient ID: Allen Wheeler, male    DOB: [birth year], 80 y.o.   MRN: [id number]  HPI Here for dry itchy skin over trunk, arms, and legs. He has eczema and it always flares in the autumn. Using Lubriderm lotion.    Review of Systems  Constitutional: Negative.   Skin: Positive for rash.       Objective:   Physical Exam  Constitutional: He appears well-developed and well-nourished.  Skin:  Scattered red macular areas           Assessment & Plan:  Given a steroid injection

## 2014-03-10 ENCOUNTER — Ambulatory Visit: Payer: Medicare Other | Admitting: Family Medicine

## 2014-03-10 ENCOUNTER — Ambulatory Visit (INDEPENDENT_AMBULATORY_CARE_PROVIDER_SITE_OTHER): Payer: Medicare Other | Admitting: Family Medicine

## 2014-03-10 DIAGNOSIS — Z23 Encounter for immunization: Secondary | ICD-10-CM | POA: Diagnosis not present

## 2014-04-12 ENCOUNTER — Other Ambulatory Visit: Payer: Self-pay | Admitting: Family Medicine

## 2014-04-13 DIAGNOSIS — H908 Mixed conductive and sensorineural hearing loss, unspecified: Secondary | ICD-10-CM | POA: Diagnosis not present

## 2014-04-13 DIAGNOSIS — H9211 Otorrhea, right ear: Secondary | ICD-10-CM | POA: Diagnosis not present

## 2014-04-13 DIAGNOSIS — H7201 Central perforation of tympanic membrane, right ear: Secondary | ICD-10-CM | POA: Diagnosis not present

## 2014-05-19 ENCOUNTER — Telehealth: Payer: Self-pay

## 2014-05-19 NOTE — Telephone Encounter (Signed)
[place] refill request for LORAZEPAM 1MG  TABS. Last filled 05/10/14

## 2014-05-19 NOTE — Telephone Encounter (Signed)
Call in #30 with 5 rf 

## 2014-05-21 MED ORDER — LORAZEPAM 1 MG PO TABS: 1.0000 mg | ORAL_TABLET | Freq: Every evening | ORAL | Status: DC | PRN

## 2014-05-21 NOTE — Telephone Encounter (Signed)
I called in script 

## 2014-05-21 NOTE — Addendum Note (Signed)
Addended by: Aggie Hacker A on: 05/21/2014 03:39 PM   Modules accepted: Orders

## 2014-05-27 ENCOUNTER — Ambulatory Visit (INDEPENDENT_AMBULATORY_CARE_PROVIDER_SITE_OTHER): Payer: Medicare Other | Admitting: Family Medicine

## 2014-05-27 ENCOUNTER — Encounter: Payer: Self-pay | Admitting: Family Medicine

## 2014-05-27 VITALS — BP 144/79 | HR 69 | Temp 97.6°F | Ht 66.0 in | Wt 166.0 lb

## 2014-05-27 DIAGNOSIS — F411 Generalized anxiety disorder: Secondary | ICD-10-CM

## 2014-05-27 DIAGNOSIS — L299 Pruritus, unspecified: Secondary | ICD-10-CM

## 2014-05-27 DIAGNOSIS — R29898 Other symptoms and signs involving the musculoskeletal system: Secondary | ICD-10-CM

## 2014-05-27 MED ORDER — CITALOPRAM HYDROBROMIDE 20 MG PO TABS: 20.0000 mg | ORAL_TABLET | Freq: Every day | ORAL | Status: DC

## 2014-05-27 MED ORDER — METHYLPREDNISOLONE ACETATE 80 MG/ML IJ SUSP
120.0000 mg | Freq: Once | INTRAMUSCULAR | Status: AC
  Administered 2014-05-27: 120 mg via INTRAMUSCULAR

## 2014-05-27 MED ORDER — GABAPENTIN 100 MG PO CAPS: 100.0000 mg | ORAL_CAPSULE | Freq: Every day | ORAL | Status: DC

## 2014-05-27 NOTE — Progress Notes (Signed)
Pre visit review using our clinic review tool, if applicable. No additional management support is needed unless otherwise documented below in the visit note. 

## 2014-05-27 NOTE — Progress Notes (Signed)
   Subjective:    Patient ID: Allen Wheeler, male    DOB: 14-Nov-1919, 81 y.o.   MRN: [id number]  HPI Here for several issues. First he requests another steroid shot to help with itching skin. He has intermittent intense itching, often with no visible rash, all over his body and he responded well to a steroid shot last fall. Also he mentions weakness and easy fatiguability of the left leg. He had several days of sharp pains shooting from the lower back down the left leg a few weeks ago but these went away. The weakness is most prominent when he walks long distances. He also has some numbness and tingling in the left leg and foot, and the foot tingling often keeps him up at night. Last year we had decreased his Celexa from 20 mg daily to 10 mg, but he finds his anxiety has been more of a problem and wants to go back to the higher dose.    Review of Systems  Musculoskeletal: Positive for gait problem. Negative for back pain.  Neurological: Positive for weakness and numbness.  Psychiatric/Behavioral: Positive for dysphoric mood. The patient is nervous/anxious.        Objective:   Physical Exam  Constitutional: He is oriented to person, place, and time. He appears well-developed and well-nourished.  Gait is normal   Musculoskeletal:  His lower back shows decreased ROM but is not tender   Neurological: He is alert and oriented to person, place, and time. He has normal reflexes. No cranial nerve deficit. He exhibits normal muscle tone. Coordination normal.  Skin: No rash noted.  Psychiatric: He has a normal mood and affect. His behavior is normal. Thought content normal.          Assessment & Plan:  Given a steroid shot for the pruritis. Increase the Celexa back to 20 mg daily. It is likely he has some spinal stenosis in the lumbar area so we will set up an MRI scan soon. Try Gabapentin at bedtime for the foot tingling.

## 2014-06-08 ENCOUNTER — Ambulatory Visit
Admission: RE | Admit: 2014-06-08 | Discharge: 2014-06-08 | Disposition: A | Discharge status: Home / Self Care | Payer: Medicare Other | Source: Ambulatory Visit | Attending: Family Medicine | Admitting: Family Medicine

## 2014-06-08 DIAGNOSIS — M47817 Spondylosis without myelopathy or radiculopathy, lumbosacral region: Secondary | ICD-10-CM | POA: Diagnosis not present

## 2014-06-08 DIAGNOSIS — R29898 Other symptoms and signs involving the musculoskeletal system: Secondary | ICD-10-CM

## 2014-06-08 DIAGNOSIS — M5137 Other intervertebral disc degeneration, lumbosacral region: Secondary | ICD-10-CM | POA: Diagnosis not present

## 2014-07-27 DIAGNOSIS — H6120 Impacted cerumen, unspecified ear: Secondary | ICD-10-CM | POA: Diagnosis not present

## 2014-07-27 DIAGNOSIS — H9210 Otorrhea, unspecified ear: Secondary | ICD-10-CM | POA: Diagnosis not present

## 2014-07-27 DIAGNOSIS — H9190 Unspecified hearing loss, unspecified ear: Secondary | ICD-10-CM | POA: Diagnosis not present

## 2014-07-27 DIAGNOSIS — H7200 Central perforation of tympanic membrane, unspecified ear: Secondary | ICD-10-CM | POA: Diagnosis not present

## 2014-09-01 DIAGNOSIS — H7201 Central perforation of tympanic membrane, right ear: Secondary | ICD-10-CM | POA: Diagnosis not present

## 2014-09-01 DIAGNOSIS — Z974 Presence of external hearing-aid: Secondary | ICD-10-CM | POA: Diagnosis not present

## 2014-09-01 DIAGNOSIS — H908 Mixed conductive and sensorineural hearing loss, unspecified: Secondary | ICD-10-CM | POA: Diagnosis not present

## 2014-09-16 ENCOUNTER — Encounter: Payer: Self-pay | Admitting: Family Medicine

## 2014-09-16 ENCOUNTER — Ambulatory Visit (INDEPENDENT_AMBULATORY_CARE_PROVIDER_SITE_OTHER): Payer: Medicare Other | Admitting: Family Medicine

## 2014-09-16 VITALS — BP 122/74 | HR 68 | Temp 97.6°F | Wt 167.1 lb

## 2014-09-16 DIAGNOSIS — L309 Dermatitis, unspecified: Secondary | ICD-10-CM | POA: Diagnosis not present

## 2014-09-16 MED ORDER — METHYLPREDNISOLONE ACETATE 80 MG/ML IJ SUSP
160.0000 mg | Freq: Once | INTRAMUSCULAR | Status: AC
  Administered 2014-09-16: 160 mg via INTRAMUSCULAR

## 2014-09-16 MED ORDER — HALOBETASOL PROPIONATE 0.05 % EX OINT: TOPICAL_OINTMENT | Freq: Two times a day (BID) | CUTANEOUS | Status: DC

## 2014-09-16 NOTE — Progress Notes (Signed)
   Subjective:    Patient ID: Allen Wheeler, male    DOB: 1919/04/12, 81 y.o.   MRN: [id number]  HPI Here to discuss his eczema. When he was here last November he received a shot of Depomedrol and this was very effective for about 6 weeks, then it wore off. He still has widespread eczema patches which are very itchy. Using CeraVe cream.    Review of Systems  Constitutional: Negative.   Skin: Positive for rash.       Objective:   Physical Exam  Constitutional: He appears well-developed and well-nourished.  Skin:  Widespread patches of red maculopapular rash. The largest of these is on the left thigh.           Assessment & Plan:  Eczema. Given a steroid shot. Try Halobetasol ointment.

## 2014-09-16 NOTE — Addendum Note (Signed)
Addended by: Patience Musca F on: 09/16/2014 10:01 AM   Modules accepted: Orders

## 2014-09-28 ENCOUNTER — Other Ambulatory Visit: Payer: Self-pay | Admitting: Family Medicine

## 2014-11-15 ENCOUNTER — Other Ambulatory Visit: Payer: Self-pay | Admitting: Family Medicine

## 2014-12-14 ENCOUNTER — Other Ambulatory Visit: Payer: Self-pay | Admitting: Family Medicine

## 2014-12-14 NOTE — Telephone Encounter (Signed)
Call in #30 with 5 rf 

## 2014-12-28 ENCOUNTER — Ambulatory Visit (INDEPENDENT_AMBULATORY_CARE_PROVIDER_SITE_OTHER): Payer: Medicare Other | Admitting: Family Medicine

## 2014-12-28 ENCOUNTER — Encounter: Payer: Self-pay | Admitting: Family Medicine

## 2014-12-28 VITALS — BP 124/64 | HR 65 | Temp 97.5°F | Ht 66.0 in | Wt 168.0 lb

## 2014-12-28 DIAGNOSIS — Z23 Encounter for immunization: Secondary | ICD-10-CM

## 2014-12-28 DIAGNOSIS — M150 Primary generalized (osteo)arthritis: Secondary | ICD-10-CM

## 2014-12-28 DIAGNOSIS — H906 Mixed conductive and sensorineural hearing loss, bilateral: Secondary | ICD-10-CM | POA: Diagnosis not present

## 2014-12-28 DIAGNOSIS — Z974 Presence of external hearing-aid: Secondary | ICD-10-CM | POA: Diagnosis not present

## 2014-12-28 DIAGNOSIS — R030 Elevated blood-pressure reading, without diagnosis of hypertension: Secondary | ICD-10-CM | POA: Diagnosis not present

## 2014-12-28 DIAGNOSIS — M159 Polyosteoarthritis, unspecified: Secondary | ICD-10-CM

## 2014-12-28 DIAGNOSIS — H6123 Impacted cerumen, bilateral: Secondary | ICD-10-CM | POA: Diagnosis not present

## 2014-12-28 DIAGNOSIS — H7201 Central perforation of tympanic membrane, right ear: Secondary | ICD-10-CM | POA: Diagnosis not present

## 2014-12-28 NOTE — Progress Notes (Signed)
Pre visit review using our clinic review tool, if applicable. No additional management support is needed unless otherwise documented below in the visit note. 

## 2014-12-28 NOTE — Progress Notes (Signed)
   Subjective:    Patient ID: Allen Wheeler, male    DOB: 01/10/1920, 81 y.o.   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 He feels well and has no concerns. I have no idea why he would have received such a form. He has no medical problems to prohibit his driving skills, and we cannot think of anyone that would have contacted the [facility] about this.    Review of Systems  Constitutional: Negative.   Respiratory: Negative.   Cardiovascular: Negative.   Neurological: Negative.        Objective:   Physical Exam  Constitutional: He is oriented to person, place, and time. He appears well-developed and well-nourished.  Cardiovascular: Normal rate, regular rhythm, normal heart sounds and intact distal pulses.   Pulmonary/Chest: Effort normal and breath sounds normal.  Neurological: He is alert and oriented to person, place, and time.          Assessment & Plan:  He seems to be doing well. I filled out the [facility] form stating that he as no problems with driving. Recheck prn

## 2015-01-13 ENCOUNTER — Other Ambulatory Visit: Payer: Self-pay | Admitting: Family Medicine

## 2015-02-10 DIAGNOSIS — H903 Sensorineural hearing loss, bilateral: Secondary | ICD-10-CM | POA: Diagnosis not present

## 2015-02-10 DIAGNOSIS — H6122 Impacted cerumen, left ear: Secondary | ICD-10-CM | POA: Diagnosis not present

## 2015-02-10 DIAGNOSIS — H938X3 Other specified disorders of ear, bilateral: Secondary | ICD-10-CM | POA: Diagnosis not present

## 2015-02-10 DIAGNOSIS — H7201 Central perforation of tympanic membrane, right ear: Secondary | ICD-10-CM | POA: Diagnosis not present

## 2015-02-24 ENCOUNTER — Other Ambulatory Visit: Payer: Self-pay | Admitting: Family Medicine

## 2015-02-25 ENCOUNTER — Ambulatory Visit (INDEPENDENT_AMBULATORY_CARE_PROVIDER_SITE_OTHER): Payer: Medicare Other | Admitting: Family Medicine

## 2015-02-25 ENCOUNTER — Encounter: Payer: Self-pay | Admitting: Family Medicine

## 2015-02-25 VITALS — BP 138/86 | HR 64 | Temp 97.8°F | Ht 66.0 in | Wt 167.0 lb

## 2015-02-25 DIAGNOSIS — L309 Dermatitis, unspecified: Secondary | ICD-10-CM

## 2015-02-25 MED ORDER — METHYLPREDNISOLONE ACETATE 80 MG/ML IJ SUSP
120.0000 mg | Freq: Once | INTRAMUSCULAR | Status: AC
  Administered 2015-02-25: 120 mg via INTRAMUSCULAR

## 2015-02-25 NOTE — Progress Notes (Signed)
Pre visit review using our clinic review tool, if applicable. No additional management support is needed unless otherwise documented below in the visit note. 

## 2015-02-25 NOTE — Progress Notes (Signed)
   Subjective:    Patient ID: Allen Wheeler, male    DOB: Mar 02, 1920, 82 y.o.   MRN: [id number]  HPI Here for a flare of his eczema. This is worst over the trunk and legs. Using Triamcinolone cream.    Review of Systems  Constitutional: Negative.   Skin: Positive for rash.       Objective:   Physical Exam  Constitutional: He appears well-developed and well-nourished.  Skin:  Scattered areas of erythema and scaling on the trunk           Assessment & Plan:  Eczema, given a steroid shot.

## 2015-02-25 NOTE — Addendum Note (Signed)
Addended by: Aggie Hacker A on: 02/25/2015 03:10 PM   Modules accepted: Orders

## 2015-03-21 DIAGNOSIS — H2513 Age-related nuclear cataract, bilateral: Secondary | ICD-10-CM | POA: Diagnosis not present

## 2015-03-21 DIAGNOSIS — H25013 Cortical age-related cataract, bilateral: Secondary | ICD-10-CM | POA: Diagnosis not present

## 2015-03-30 ENCOUNTER — Encounter: Payer: Self-pay | Admitting: Family Medicine

## 2015-03-30 ENCOUNTER — Ambulatory Visit (INDEPENDENT_AMBULATORY_CARE_PROVIDER_SITE_OTHER): Payer: Medicare Other | Admitting: Family Medicine

## 2015-03-30 VITALS — BP 136/74 | HR 68 | Temp 98.1°F | Ht 66.0 in | Wt 168.0 lb

## 2015-03-30 DIAGNOSIS — J209 Acute bronchitis, unspecified: Secondary | ICD-10-CM

## 2015-03-30 MED ORDER — HYDROCODONE-HOMATROPINE 5-1.5 MG/5ML PO SYRP: 5.0000 mL | ORAL_SOLUTION | ORAL | Status: DC | PRN

## 2015-03-30 MED ORDER — AZITHROMYCIN 250 MG PO TABS: ORAL_TABLET | ORAL | Status: DC

## 2015-03-30 NOTE — Progress Notes (Signed)
   Subjective:    Patient ID: Allen Wheeler, male    DOB: [birth year], 81 y.o.   MRN: [id number]  HPI Here for 4 days of chest tightness and coughing up yellow sputum. No fever.    Review of Systems  Constitutional: Negative.   HENT: Positive for congestion and postnasal drip. Negative for sinus pressure and sore throat.   Eyes: Negative.   Respiratory: Positive for cough, chest tightness and wheezing. Negative for shortness of breath.   Cardiovascular: Negative.        Objective:   Physical Exam  Constitutional: He appears well-developed and well-nourished.  HENT:  Right Ear: External ear normal.  Left Ear: External ear normal.  Nose: Nose normal.  Mouth/Throat: Oropharynx is clear and moist.  Eyes: Conjunctivae are normal.  Neck: No thyromegaly present.  Pulmonary/Chest: Effort normal. No respiratory distress. He has no rales.  Scattered rhonchi and wheezes   Lymphadenopathy:    He has no cervical adenopathy.          Assessment & Plan:  Bronchitis, treat with a Zpack

## 2015-03-30 NOTE — Progress Notes (Signed)
Pre visit review using our clinic review tool, if applicable. No additional management support is needed unless otherwise documented below in the visit note. 

## 2015-04-10 HISTORY — PX: CATARACT EXTRACTION: SUR2

## 2015-04-18 ENCOUNTER — Other Ambulatory Visit: Payer: Self-pay | Admitting: Family Medicine

## 2015-04-19 NOTE — Telephone Encounter (Signed)
Can we refill this? 

## 2015-04-25 DIAGNOSIS — H7201 Central perforation of tympanic membrane, right ear: Secondary | ICD-10-CM | POA: Diagnosis not present

## 2015-04-25 DIAGNOSIS — H6122 Impacted cerumen, left ear: Secondary | ICD-10-CM | POA: Diagnosis not present

## 2015-04-25 DIAGNOSIS — H9211 Otorrhea, right ear: Secondary | ICD-10-CM | POA: Diagnosis not present

## 2015-04-25 DIAGNOSIS — Z974 Presence of external hearing-aid: Secondary | ICD-10-CM | POA: Diagnosis not present

## 2015-04-25 DIAGNOSIS — H906 Mixed conductive and sensorineural hearing loss, bilateral: Secondary | ICD-10-CM | POA: Diagnosis not present

## 2015-05-02 ENCOUNTER — Other Ambulatory Visit: Payer: Self-pay | Admitting: Family Medicine

## 2015-05-03 ENCOUNTER — Ambulatory Visit (INDEPENDENT_AMBULATORY_CARE_PROVIDER_SITE_OTHER): Payer: Medicare Other | Admitting: Family Medicine

## 2015-05-03 ENCOUNTER — Encounter: Payer: Self-pay | Admitting: Family Medicine

## 2015-05-03 VITALS — BP 138/74 | HR 53 | Temp 97.9°F | Ht 66.0 in | Wt 167.0 lb

## 2015-05-03 DIAGNOSIS — Z91048 Other nonmedicinal substance allergy status: Secondary | ICD-10-CM | POA: Diagnosis not present

## 2015-05-03 DIAGNOSIS — Z9109 Other allergy status, other than to drugs and biological substances: Secondary | ICD-10-CM

## 2015-05-03 MED ORDER — METHYLPREDNISOLONE ACETATE 80 MG/ML IJ SUSP
120.0000 mg | Freq: Once | INTRAMUSCULAR | Status: AC
  Administered 2015-05-03: 120 mg via INTRAMUSCULAR

## 2015-05-03 NOTE — Progress Notes (Signed)
Pre visit review using our clinic review tool, if applicable. No additional management support is needed unless otherwise documented below in the visit note. 

## 2015-05-03 NOTE — Telephone Encounter (Signed)
Ok to refill 

## 2015-05-03 NOTE — Addendum Note (Signed)
Addended by: Aggie Hacker A on: 05/03/2015 05:05 PM   Modules accepted: Orders

## 2015-05-03 NOTE — Progress Notes (Signed)
   Subjective:    Patient ID: Allen Wheeler, male    DOB: 21-Oct-1919, 82 y.o.   MRN: [id number]  HPI Here for 3 days of PND and a ST. No fever or cough. He uses Azelastine and Flonase sprays daily.    Review of Systems  Constitutional: Negative.   HENT: Positive for postnasal drip and sore throat. Negative for congestion, ear pain, sinus pressure, trouble swallowing and voice change.   Eyes: Negative.   Respiratory: Negative.   Cardiovascular: Negative.        Objective:   Physical Exam  Constitutional: He appears well-developed and well-nourished.  HENT:  Right Ear: External ear normal.  Left Ear: External ear normal.  Nose: Nose normal.  Mouth/Throat: Oropharynx is clear and moist.  Eyes: Conjunctivae are normal. Pupils are equal, round, and reactive to light.  Neck: Neck supple. No thyromegaly present.  Cardiovascular: Normal rate, regular rhythm, normal heart sounds and intact distal pulses.   Pulmonary/Chest: Effort normal and breath sounds normal.  Lymphadenopathy:    He has no cervical adenopathy.          Assessment & Plan:  Allergies, given a steroid shot. He may try Claritin daily as well.

## 2015-06-08 ENCOUNTER — Other Ambulatory Visit: Payer: Self-pay | Admitting: Family Medicine

## 2015-06-08 NOTE — Telephone Encounter (Signed)
Call in #30 with 5 rf 

## 2015-06-10 DIAGNOSIS — H9211 Otorrhea, right ear: Secondary | ICD-10-CM | POA: Diagnosis not present

## 2015-06-10 DIAGNOSIS — Z974 Presence of external hearing-aid: Secondary | ICD-10-CM | POA: Diagnosis not present

## 2015-06-10 DIAGNOSIS — H7291 Unspecified perforation of tympanic membrane, right ear: Secondary | ICD-10-CM | POA: Diagnosis not present

## 2015-06-10 DIAGNOSIS — H906 Mixed conductive and sensorineural hearing loss, bilateral: Secondary | ICD-10-CM | POA: Diagnosis not present

## 2015-06-16 ENCOUNTER — Other Ambulatory Visit: Payer: Self-pay | Admitting: Family Medicine

## 2015-07-25 ENCOUNTER — Encounter: Payer: Self-pay | Admitting: Family Medicine

## 2015-07-25 ENCOUNTER — Ambulatory Visit (INDEPENDENT_AMBULATORY_CARE_PROVIDER_SITE_OTHER): Payer: Medicare Other | Admitting: Family Medicine

## 2015-07-25 VITALS — BP 136/72 | HR 57 | Temp 97.7°F | Ht 66.0 in | Wt 166.0 lb

## 2015-07-25 DIAGNOSIS — R030 Elevated blood-pressure reading, without diagnosis of hypertension: Secondary | ICD-10-CM

## 2015-07-25 DIAGNOSIS — L299 Pruritus, unspecified: Secondary | ICD-10-CM | POA: Diagnosis not present

## 2015-07-25 DIAGNOSIS — F411 Generalized anxiety disorder: Secondary | ICD-10-CM | POA: Diagnosis not present

## 2015-07-25 DIAGNOSIS — F329 Major depressive disorder, single episode, unspecified: Secondary | ICD-10-CM | POA: Diagnosis not present

## 2015-07-25 DIAGNOSIS — I10 Essential (primary) hypertension: Secondary | ICD-10-CM

## 2015-07-25 DIAGNOSIS — M159 Polyosteoarthritis, unspecified: Secondary | ICD-10-CM

## 2015-07-25 DIAGNOSIS — N401 Enlarged prostate with lower urinary tract symptoms: Secondary | ICD-10-CM

## 2015-07-25 DIAGNOSIS — N138 Other obstructive and reflux uropathy: Secondary | ICD-10-CM

## 2015-07-25 DIAGNOSIS — E039 Hypothyroidism, unspecified: Secondary | ICD-10-CM

## 2015-07-25 DIAGNOSIS — M150 Primary generalized (osteo)arthritis: Secondary | ICD-10-CM

## 2015-07-25 DIAGNOSIS — F32A Depression, unspecified: Secondary | ICD-10-CM

## 2015-07-25 DIAGNOSIS — K219 Gastro-esophageal reflux disease without esophagitis: Secondary | ICD-10-CM

## 2015-07-25 LAB — BASIC METABOLIC PANEL
BUN: 17 mg/dL (ref 6–23)
CALCIUM: 9.6 mg/dL (ref 8.4–10.5)
CO2: 25 mEq/L (ref 19–32)
CREATININE: 1.23 mg/dL (ref 0.40–1.50)
Chloride: 103 mEq/L (ref 96–112)
GFR: 58.3 mL/min — AB (ref >60.00)
GLUCOSE: 89 mg/dL (ref 70–99)
Potassium: 4.1 mEq/L (ref 3.5–5.1)
SODIUM: 139 meq/L (ref 135–145)

## 2015-07-25 LAB — HEPATIC FUNCTION PANEL
ALBUMIN: 4 g/dL (ref 3.5–5.2)
ALT: 14 U/L (ref 0–53)
AST: 21 U/L (ref 0–37)
Alkaline Phosphatase: 61 U/L (ref 39–117)
Bilirubin, Direct: 0.1 mg/dL (ref 0.0–0.3)
TOTAL PROTEIN: 7.6 g/dL (ref 6.0–8.3)
Total Bilirubin: 0.7 mg/dL (ref 0.2–1.2)

## 2015-07-25 LAB — POC URINALSYSI DIPSTICK (AUTOMATED)
Bilirubin, UA: NEGATIVE
Glucose, UA: NEGATIVE
Ketones, UA: NEGATIVE
Leukocytes, UA: NEGATIVE
Nitrite, UA: NEGATIVE
PH UA: 6
SPEC GRAV UA: 1.02
UROBILINOGEN UA: 0.2

## 2015-07-25 LAB — CBC WITH DIFFERENTIAL/PLATELET
BASOS ABS: 0 10*3/uL (ref 0.0–0.1)
Basophils Relative: 0.3 % (ref 0.0–3.0)
EOS ABS: 0.2 10*3/uL (ref 0.0–0.7)
Eosinophils Relative: 2.3 % (ref 0.0–5.0)
HCT: 45.1 % (ref 39.0–52.0)
HEMOGLOBIN: 15.2 g/dL (ref 13.0–17.0)
LYMPHS ABS: 2.2 10*3/uL (ref 0.7–4.0)
Lymphocytes Relative: 31.2 % (ref 12.0–46.0)
MCHC: 33.7 g/dL (ref 30.0–36.0)
MCV: 96.3 fl (ref 78.0–100.0)
MONO ABS: 0.7 10*3/uL (ref 0.1–1.0)
Monocytes Relative: 9.7 % (ref 3.0–12.0)
NEUTROS PCT: 56.5 % (ref 43.0–77.0)
Neutro Abs: 4 10*3/uL (ref 1.4–7.7)
Platelets: 186 10*3/uL (ref 150.0–400.0)
RBC: 4.69 Mil/uL (ref 4.22–5.81)
RDW: 14 % (ref 11.5–15.5)
WBC: 7.1 10*3/uL (ref 4.0–10.5)

## 2015-07-25 LAB — PSA: PSA: 3.89 ng/mL (ref 0.10–4.00)

## 2015-07-25 LAB — LIPID PANEL
CHOLESTEROL: 241 mg/dL — AB (ref 0–200)
HDL: 37.5 mg/dL — AB (ref >39.00)
NonHDL: 203.44
TRIGLYCERIDES: 203 mg/dL — AB (ref 0.0–149.0)
Total CHOL/HDL Ratio: 6
VLDL: 40.6 mg/dL — AB (ref 0.0–40.0)

## 2015-07-25 LAB — TSH: TSH: 4.53 u[IU]/mL — AB (ref 0.35–4.50)

## 2015-07-25 LAB — LDL CHOLESTEROL, DIRECT: LDL DIRECT: 179 mg/dL

## 2015-07-25 MED ORDER — CIPROFLOXACIN-DEXAMETHASONE 0.3-0.1 % OT SUSP: 4.0000 [drp] | Freq: Two times a day (BID) | OTIC | Status: DC

## 2015-07-25 MED ORDER — CITALOPRAM HYDROBROMIDE 10 MG PO TABS: 10.0000 mg | ORAL_TABLET | Freq: Every day | ORAL | Status: DC

## 2015-07-25 MED ORDER — METHYLPREDNISOLONE ACETATE 80 MG/ML IJ SUSP
80.0000 mg | Freq: Once | INTRAMUSCULAR | Status: AC
  Administered 2015-07-25: 80 mg via INTRAMUSCULAR

## 2015-07-25 NOTE — Progress Notes (Signed)
Pre visit review using our clinic review tool, if applicable. No additional management support is needed unless otherwise documented below in the visit note. 

## 2015-07-25 NOTE — Progress Notes (Signed)
   Subjective:    Patient ID: Allen Wheeler, male    DOB: [birth year], 82 y.o.   MRN: [id number]  HPI 82 yr old male to follow up on several issues. First his BP has been stable. He feels well in general although his arthritis acts up at times. He remains active and enjoys gardening in his yard. He does complain of frequent urinations and a sense of incomplete bladder emptying. No real nocturia or discomfort. His depression is stable on Celexa. He sleeps well.    Review of Systems  Constitutional: Negative.   HENT: Negative.   Eyes: Negative.   Respiratory: Negative.   Cardiovascular: Negative.   Gastrointestinal: Negative.   Genitourinary: Positive for frequency and difficulty urinating. Negative for dysuria, urgency, hematuria, flank pain, decreased urine volume and testicular pain.  Musculoskeletal: Positive for back pain and arthralgias. Negative for myalgias and gait problem.  Skin: Negative.   Neurological: Negative.   Psychiatric/Behavioral: Negative.        Objective:   Physical Exam  Constitutional: He is oriented to person, place, and time. He appears well-developed and well-nourished. No distress.  HENT:  Head: Normocephalic and atraumatic.  Right Ear: External ear normal.  Left Ear: External ear normal.  Nose: Nose normal.  Mouth/Throat: Oropharynx is clear and moist. No oropharyngeal exudate.  Eyes: Conjunctivae and EOM are normal. Pupils are equal, round, and reactive to light. Right eye exhibits no discharge. Left eye exhibits no discharge. No scleral icterus.  Neck: Neck supple. No JVD present. No tracheal deviation present. No thyromegaly present.  Cardiovascular: Normal rate, regular rhythm, normal heart sounds and intact distal pulses.  Exam reveals no gallop and no friction rub.   No murmur heard. EKG shows stable LBBB  Pulmonary/Chest: Effort normal and breath sounds normal. No respiratory distress. He has no wheezes. He has no rales. He exhibits no  tenderness.  Abdominal: Soft. Bowel sounds are normal. He exhibits no distension and no mass. There is no tenderness. There is no rebound and no guarding.  Genitourinary: Rectum normal, prostate normal and penis normal. Guaiac negative stool. No penile tenderness.  Musculoskeletal: Normal range of motion. He exhibits no edema or tenderness.  Lymphadenopathy:    He has no cervical adenopathy.  Neurological: He is alert and oriented to person, place, and time. He has normal reflexes. No cranial nerve deficit. He exhibits normal muscle tone. Coordination normal.  Skin: Skin is warm and dry. No rash noted. He is not diaphoretic. No erythema. No pallor.  Psychiatric: He has a normal mood and affect. His behavior is normal. Judgment and thought content normal.          Assessment & Plan:  His BP is stable. He has some BPH issues so I suggested he try saw palmetto OTC. His depression and anxiety are stable. His arthritis is flaring up so he is given a steroid shot today. Get fasting labs.  [name], MD

## 2015-07-25 NOTE — Addendum Note (Signed)
Addended by: Vernie Shanks E on: 07/25/2015 01:05 PM   Modules accepted: Orders

## 2015-07-28 MED ORDER — LEVOTHYROXINE SODIUM 50 MCG PO TABS: 50.0000 ug | ORAL_TABLET | Freq: Every day | ORAL | Status: DC

## 2015-07-28 NOTE — Addendum Note (Signed)
Addended by: Aggie Hacker A on: 07/28/2015 10:19 AM   Modules accepted: Orders

## 2015-08-04 ENCOUNTER — Other Ambulatory Visit: Payer: Medicare Other

## 2015-08-09 ENCOUNTER — Other Ambulatory Visit: Payer: Medicare Other

## 2015-08-10 ENCOUNTER — Telehealth: Payer: Self-pay | Admitting: Family Medicine

## 2015-08-10 ENCOUNTER — Other Ambulatory Visit (INDEPENDENT_AMBULATORY_CARE_PROVIDER_SITE_OTHER): Payer: Medicare Other

## 2015-08-10 DIAGNOSIS — C189 Malignant neoplasm of colon, unspecified: Secondary | ICD-10-CM

## 2015-08-10 LAB — FECAL OCCULT BLOOD, IMMUNOCHEMICAL: Fecal Occult Bld: NEGATIVE

## 2015-08-10 NOTE — Telephone Encounter (Signed)
[name] was able to help

## 2015-10-10 ENCOUNTER — Other Ambulatory Visit: Payer: Self-pay | Admitting: Family Medicine

## 2015-10-31 ENCOUNTER — Other Ambulatory Visit: Payer: Self-pay | Admitting: Family Medicine

## 2015-11-01 DIAGNOSIS — H722X1 Other marginal perforations of tympanic membrane, right ear: Secondary | ICD-10-CM | POA: Diagnosis not present

## 2015-11-01 DIAGNOSIS — H906 Mixed conductive and sensorineural hearing loss, bilateral: Secondary | ICD-10-CM | POA: Diagnosis not present

## 2015-11-01 DIAGNOSIS — Z974 Presence of external hearing-aid: Secondary | ICD-10-CM | POA: Diagnosis not present

## 2015-11-01 DIAGNOSIS — H6122 Impacted cerumen, left ear: Secondary | ICD-10-CM | POA: Diagnosis not present

## 2015-11-01 DIAGNOSIS — H9211 Otorrhea, right ear: Secondary | ICD-10-CM | POA: Diagnosis not present

## 2015-11-22 ENCOUNTER — Telehealth: Payer: Self-pay | Admitting: Family Medicine

## 2015-11-25 NOTE — Telephone Encounter (Signed)
Call in #30 with 5 rf 

## 2015-11-25 NOTE — Telephone Encounter (Signed)
Script was called in.

## 2015-11-25 NOTE — Telephone Encounter (Signed)
Pt following up on refill request

## 2015-12-08 ENCOUNTER — Telehealth: Payer: Self-pay | Admitting: Family Medicine

## 2015-12-08 NOTE — Telephone Encounter (Signed)
Pt states he has imposed a "no carb" diet upon himself. Pt wants to know if there are any  (potassium and magnesium) that pt needs to be taking.  Pt wants to know if he should dc taking levothyroxine (SYNTHROID, LEVOTHROID) 50 MCG tablet  if Dr Sarajane Jews prescribes electrolytes.  Pt states he has not had his thyroid checked in a while and is that ok?

## 2015-12-09 ENCOUNTER — Other Ambulatory Visit: Payer: Self-pay

## 2015-12-09 NOTE — Addendum Note (Signed)
Addended by: Alysia Penna A on: 12/09/2015 11:00 AM   Modules accepted: Orders

## 2015-12-09 NOTE — Telephone Encounter (Signed)
I have spoken with pt and went over all of the below information.

## 2015-12-09 NOTE — Telephone Encounter (Signed)
Can you call pt to schedule the lab appointment? 

## 2015-12-09 NOTE — Telephone Encounter (Signed)
Yes he will always need to take the Synthroid no matter what. I have put in an order to recheck the TSH  next week and he needs to make a lab appt. He should take a good multivitamin like Centrum Silver daily

## 2015-12-13 ENCOUNTER — Encounter: Payer: Self-pay | Admitting: Family Medicine

## 2015-12-13 ENCOUNTER — Other Ambulatory Visit (INDEPENDENT_AMBULATORY_CARE_PROVIDER_SITE_OTHER): Payer: Medicare Other

## 2015-12-13 ENCOUNTER — Ambulatory Visit (INDEPENDENT_AMBULATORY_CARE_PROVIDER_SITE_OTHER): Payer: Medicare Other | Admitting: Family Medicine

## 2015-12-13 ENCOUNTER — Telehealth: Payer: Self-pay | Admitting: Family Medicine

## 2015-12-13 VITALS — BP 138/78 | HR 59 | Temp 97.7°F | Ht 66.0 in | Wt 156.0 lb

## 2015-12-13 DIAGNOSIS — E039 Hypothyroidism, unspecified: Secondary | ICD-10-CM | POA: Insufficient documentation

## 2015-12-13 DIAGNOSIS — Z23 Encounter for immunization: Secondary | ICD-10-CM

## 2015-12-13 LAB — TSH: TSH: 1.6 u[IU]/mL (ref 0.35–4.50)

## 2015-12-13 NOTE — Progress Notes (Signed)
   Subjective:    Patient ID: Allen Wheeler, male    DOB: Feb 18, 1920, 82 y.o.   MRN: [id number]  HPI Ere with questions about several issues. First he had blood drawn today to check his thyroid level. He asks about te role of the thyroid and what happens to the body if the level is low. He had been on a low carbohydrate diet for the past 4 weeks and he feels better. He has lost 10 lbs. He has questions about nutrition.    Review of Systems  Constitutional: Negative.   Respiratory: Negative.   Cardiovascular: Negative.   Gastrointestinal: Negative.   Endocrine: Negative.   Neurological: Negative.        Objective:   Physical Exam  Constitutional: He is oriented to person, place, and time. He appears well-developed and well-nourished.  Neck: No thyromegaly present.  Cardiovascular: Normal rate, regular rhythm, normal heart sounds and intact distal pulses.   Pulmonary/Chest: Effort normal and breath sounds normal.  Lymphadenopathy:    He has no cervical adenopathy.  Neurological: He is alert and oriented to person, place, and time.          Assessment & Plan:  He has hypothyroidism and we will check a TSH today. I discussed the health effects of this including fatigue, weight gain, etc. I told him that following a low carb diet is okay as long as her gets some natural carbs like fruit. He will focus on removing the processed sugars from his diet. I also suggested he add a daily multivitamin like Centrum Silver.  [name], MD

## 2015-12-13 NOTE — Telephone Encounter (Signed)
He is on a diet and is taking some vitamins  That he wanted to let Dr. Sarajane Jews know about. He in't hearing to well with hearing aids to know what the name of the Rx is. He also has cataract surgery coming up next month and wanted to speak with you before then, in person, because he doesn't hear well on the phone.

## 2015-12-13 NOTE — Progress Notes (Signed)
Pre visit review using our clinic review tool, if applicable. No additional management support is needed unless otherwise documented below in the visit note. 

## 2015-12-13 NOTE — Telephone Encounter (Signed)
Pt is here on the schedule to see Dr. Sarajane Jews, will discuss these issues now.

## 2015-12-14 NOTE — Telephone Encounter (Signed)
Pt had been scheduled

## 2016-01-16 ENCOUNTER — Other Ambulatory Visit: Payer: Self-pay | Admitting: Family Medicine

## 2016-01-16 DIAGNOSIS — H2513 Age-related nuclear cataract, bilateral: Secondary | ICD-10-CM | POA: Diagnosis not present

## 2016-01-16 DIAGNOSIS — H52203 Unspecified astigmatism, bilateral: Secondary | ICD-10-CM | POA: Diagnosis not present

## 2016-01-16 DIAGNOSIS — H5203 Hypermetropia, bilateral: Secondary | ICD-10-CM | POA: Diagnosis not present

## 2016-01-16 DIAGNOSIS — H25043 Posterior subcapsular polar age-related cataract, bilateral: Secondary | ICD-10-CM | POA: Diagnosis not present

## 2016-01-16 DIAGNOSIS — H524 Presbyopia: Secondary | ICD-10-CM | POA: Diagnosis not present

## 2016-01-18 ENCOUNTER — Other Ambulatory Visit: Payer: Self-pay | Admitting: Family Medicine

## 2016-01-25 DIAGNOSIS — H2512 Age-related nuclear cataract, left eye: Secondary | ICD-10-CM | POA: Diagnosis not present

## 2016-01-25 DIAGNOSIS — H25012 Cortical age-related cataract, left eye: Secondary | ICD-10-CM | POA: Diagnosis not present

## 2016-02-01 DIAGNOSIS — H2511 Age-related nuclear cataract, right eye: Secondary | ICD-10-CM | POA: Diagnosis not present

## 2016-02-01 DIAGNOSIS — H25041 Posterior subcapsular polar age-related cataract, right eye: Secondary | ICD-10-CM | POA: Diagnosis not present

## 2016-02-01 DIAGNOSIS — H25012 Cortical age-related cataract, left eye: Secondary | ICD-10-CM | POA: Diagnosis not present

## 2016-02-01 DIAGNOSIS — H2512 Age-related nuclear cataract, left eye: Secondary | ICD-10-CM | POA: Diagnosis not present

## 2016-02-08 DIAGNOSIS — H25041 Posterior subcapsular polar age-related cataract, right eye: Secondary | ICD-10-CM | POA: Diagnosis not present

## 2016-02-08 DIAGNOSIS — H2511 Age-related nuclear cataract, right eye: Secondary | ICD-10-CM | POA: Diagnosis not present

## 2016-02-14 ENCOUNTER — Inpatient Hospital Stay (HOSPITAL_COMMUNITY)
Admission: EM | Admit: 2016-02-14 | Discharge: 2016-02-20 | DRG: 418 | Disposition: A | Discharge status: Home Health | Payer: Medicare Other | Attending: Internal Medicine | Admitting: Internal Medicine

## 2016-02-14 ENCOUNTER — Encounter (HOSPITAL_COMMUNITY): Payer: Self-pay

## 2016-02-14 ENCOUNTER — Emergency Department (HOSPITAL_COMMUNITY): Payer: Medicare Other

## 2016-02-14 DIAGNOSIS — Z87442 Personal history of urinary calculi: Secondary | ICD-10-CM | POA: Diagnosis not present

## 2016-02-14 DIAGNOSIS — Z79899 Other long term (current) drug therapy: Secondary | ICD-10-CM | POA: Diagnosis not present

## 2016-02-14 DIAGNOSIS — K8050 Calculus of bile duct without cholangitis or cholecystitis without obstruction: Secondary | ICD-10-CM | POA: Diagnosis not present

## 2016-02-14 DIAGNOSIS — F418 Other specified anxiety disorders: Secondary | ICD-10-CM | POA: Diagnosis not present

## 2016-02-14 DIAGNOSIS — K5903 Drug induced constipation: Secondary | ICD-10-CM | POA: Diagnosis present

## 2016-02-14 DIAGNOSIS — I447 Left bundle-branch block, unspecified: Secondary | ICD-10-CM | POA: Diagnosis present

## 2016-02-14 DIAGNOSIS — K567 Ileus, unspecified: Secondary | ICD-10-CM | POA: Diagnosis present

## 2016-02-14 DIAGNOSIS — K219 Gastro-esophageal reflux disease without esophagitis: Secondary | ICD-10-CM | POA: Diagnosis present

## 2016-02-14 DIAGNOSIS — F329 Major depressive disorder, single episode, unspecified: Secondary | ICD-10-CM | POA: Diagnosis present

## 2016-02-14 DIAGNOSIS — F411 Generalized anxiety disorder: Secondary | ICD-10-CM | POA: Diagnosis present

## 2016-02-14 DIAGNOSIS — Z79891 Long term (current) use of opiate analgesic: Secondary | ICD-10-CM

## 2016-02-14 DIAGNOSIS — R262 Difficulty in walking, not elsewhere classified: Secondary | ICD-10-CM

## 2016-02-14 DIAGNOSIS — J302 Other seasonal allergic rhinitis: Secondary | ICD-10-CM | POA: Diagnosis present

## 2016-02-14 DIAGNOSIS — R29898 Other symptoms and signs involving the musculoskeletal system: Secondary | ICD-10-CM

## 2016-02-14 DIAGNOSIS — T40605A Adverse effect of unspecified narcotics, initial encounter: Secondary | ICD-10-CM | POA: Diagnosis present

## 2016-02-14 DIAGNOSIS — M6281 Muscle weakness (generalized): Secondary | ICD-10-CM

## 2016-02-14 DIAGNOSIS — E039 Hypothyroidism, unspecified: Secondary | ICD-10-CM | POA: Diagnosis not present

## 2016-02-14 DIAGNOSIS — K8051 Calculus of bile duct without cholangitis or cholecystitis with obstruction: Secondary | ICD-10-CM | POA: Diagnosis not present

## 2016-02-14 DIAGNOSIS — R918 Other nonspecific abnormal finding of lung field: Secondary | ICD-10-CM | POA: Diagnosis not present

## 2016-02-14 DIAGNOSIS — K8061 Calculus of gallbladder and bile duct with cholecystitis, unspecified, with obstruction: Principal | ICD-10-CM | POA: Diagnosis present

## 2016-02-14 DIAGNOSIS — K831 Obstruction of bile duct: Secondary | ICD-10-CM | POA: Diagnosis not present

## 2016-02-14 DIAGNOSIS — A419 Sepsis, unspecified organism: Secondary | ICD-10-CM | POA: Diagnosis not present

## 2016-02-14 DIAGNOSIS — R935 Abnormal findings on diagnostic imaging of other abdominal regions, including retroperitoneum: Secondary | ICD-10-CM | POA: Diagnosis not present

## 2016-02-14 DIAGNOSIS — G5630 Lesion of radial nerve, unspecified upper limb: Secondary | ICD-10-CM | POA: Diagnosis present

## 2016-02-14 DIAGNOSIS — Z87891 Personal history of nicotine dependence: Secondary | ICD-10-CM

## 2016-02-14 DIAGNOSIS — K830 Cholangitis: Secondary | ICD-10-CM | POA: Diagnosis not present

## 2016-02-14 DIAGNOSIS — R1111 Vomiting without nausea: Secondary | ICD-10-CM | POA: Diagnosis not present

## 2016-02-14 DIAGNOSIS — K8063 Calculus of gallbladder and bile duct with acute cholecystitis with obstruction: Secondary | ICD-10-CM | POA: Diagnosis not present

## 2016-02-14 DIAGNOSIS — K2970 Gastritis, unspecified, without bleeding: Secondary | ICD-10-CM | POA: Diagnosis not present

## 2016-02-14 DIAGNOSIS — F32A Depression, unspecified: Secondary | ICD-10-CM | POA: Diagnosis present

## 2016-02-14 DIAGNOSIS — M1990 Unspecified osteoarthritis, unspecified site: Secondary | ICD-10-CM | POA: Diagnosis present

## 2016-02-14 DIAGNOSIS — R945 Abnormal results of liver function studies: Secondary | ICD-10-CM

## 2016-02-14 DIAGNOSIS — R1084 Generalized abdominal pain: Secondary | ICD-10-CM | POA: Diagnosis not present

## 2016-02-14 DIAGNOSIS — R7989 Other specified abnormal findings of blood chemistry: Secondary | ICD-10-CM

## 2016-02-14 DIAGNOSIS — R531 Weakness: Secondary | ICD-10-CM | POA: Diagnosis not present

## 2016-02-14 DIAGNOSIS — K8309 Other cholangitis: Secondary | ICD-10-CM

## 2016-02-14 DIAGNOSIS — H9190 Unspecified hearing loss, unspecified ear: Secondary | ICD-10-CM | POA: Diagnosis present

## 2016-02-14 DIAGNOSIS — K8021 Calculus of gallbladder without cholecystitis with obstruction: Secondary | ICD-10-CM | POA: Diagnosis present

## 2016-02-14 DIAGNOSIS — F419 Anxiety disorder, unspecified: Secondary | ICD-10-CM | POA: Diagnosis present

## 2016-02-14 DIAGNOSIS — R1010 Upper abdominal pain, unspecified: Secondary | ICD-10-CM | POA: Diagnosis not present

## 2016-02-14 DIAGNOSIS — R1011 Right upper quadrant pain: Secondary | ICD-10-CM | POA: Diagnosis not present

## 2016-02-14 DIAGNOSIS — K8020 Calculus of gallbladder without cholecystitis without obstruction: Secondary | ICD-10-CM | POA: Diagnosis not present

## 2016-02-14 DIAGNOSIS — K8010 Calculus of gallbladder with chronic cholecystitis without obstruction: Secondary | ICD-10-CM | POA: Diagnosis not present

## 2016-02-14 LAB — COMPREHENSIVE METABOLIC PANEL
ALBUMIN: 3.9 g/dL (ref 3.5–5.0)
ALK PHOS: 152 U/L — AB (ref 38–126)
ALT: 156 U/L — ABNORMAL HIGH (ref 17–63)
ANION GAP: 12 (ref 5–15)
AST: 356 U/L — AB (ref 15–41)
BILIRUBIN TOTAL: 2.3 mg/dL — AB (ref 0.3–1.2)
BUN: 22 mg/dL — AB (ref 6–20)
CO2: 22 mmol/L (ref 22–32)
Calcium: 9.8 mg/dL (ref 8.9–10.3)
Chloride: 103 mmol/L (ref 101–111)
Creatinine, Ser: 1.31 mg/dL — ABNORMAL HIGH (ref 0.61–1.24)
GFR calc Af Amer: 52 mL/min — ABNORMAL LOW (ref >60)
GFR calc non Af Amer: 45 mL/min — ABNORMAL LOW (ref >60)
GLUCOSE: 179 mg/dL — AB (ref 65–99)
POTASSIUM: 4.2 mmol/L (ref 3.5–5.1)
SODIUM: 137 mmol/L (ref 135–145)
TOTAL PROTEIN: 8 g/dL (ref 6.5–8.1)

## 2016-02-14 LAB — I-STAT TROPONIN, ED: Troponin i, poc: 0 ng/mL (ref 0.00–0.08)

## 2016-02-14 LAB — CBC
HEMATOCRIT: 44.4 % (ref 39.0–52.0)
HEMOGLOBIN: 15.2 g/dL (ref 13.0–17.0)
MCH: 32.1 pg (ref 26.0–34.0)
MCHC: 34.2 g/dL (ref 30.0–36.0)
MCV: 93.9 fL (ref 78.0–100.0)
Platelets: 163 10*3/uL (ref 150–400)
RBC: 4.73 MIL/uL (ref 4.22–5.81)
RDW: 13.2 % (ref 11.5–15.5)
WBC: 14.2 10*3/uL — ABNORMAL HIGH (ref 4.0–10.5)

## 2016-02-14 LAB — URINALYSIS, ROUTINE W REFLEX MICROSCOPIC
BILIRUBIN URINE: NEGATIVE
GLUCOSE, UA: NEGATIVE mg/dL
HGB URINE DIPSTICK: NEGATIVE
KETONES UR: 40 mg/dL — AB
Leukocytes, UA: NEGATIVE
Nitrite: NEGATIVE
PH: 6.5 (ref 5.0–8.0)
Protein, ur: NEGATIVE mg/dL
SPECIFIC GRAVITY, URINE: 1.018 (ref 1.005–1.030)

## 2016-02-14 LAB — LIPASE, BLOOD: Lipase: 51 U/L (ref 11–51)

## 2016-02-14 MED ORDER — SODIUM CHLORIDE 0.9 % IV BOLUS (SEPSIS)
1000.0000 mL | Freq: Once | INTRAVENOUS | Status: AC
  Administered 2016-02-14: 1000 mL via INTRAVENOUS

## 2016-02-14 MED ORDER — PIPERACILLIN-TAZOBACTAM 3.375 G IVPB
3.3750 g | Freq: Three times a day (TID) | INTRAVENOUS | Status: DC
  Administered 2016-02-14 – 2016-02-18 (×11): 3.375 g via INTRAVENOUS
  Filled 2016-02-14 (×13): qty 50

## 2016-02-14 MED ORDER — IOPAMIDOL (ISOVUE-300) INJECTION 61%
INTRAVENOUS | Status: AC
  Administered 2016-02-14: 80 mL
  Filled 2016-02-14: qty 100

## 2016-02-14 MED ORDER — FENTANYL CITRATE (PF) 100 MCG/2ML IJ SOLN
25.0000 ug | INTRAMUSCULAR | Status: DC | PRN
  Administered 2016-02-16 (×2): 25 ug via INTRAVENOUS
  Filled 2016-02-14 (×3): qty 2

## 2016-02-14 MED ORDER — GI COCKTAIL ~~LOC~~
30.0000 mL | Freq: Once | ORAL | Status: AC
  Administered 2016-02-14: 30 mL via ORAL
  Filled 2016-02-14: qty 30

## 2016-02-14 MED ORDER — FENTANYL CITRATE (PF) 100 MCG/2ML IJ SOLN
50.0000 ug | Freq: Once | INTRAMUSCULAR | Status: AC
  Administered 2016-02-14: 50 ug via INTRAVENOUS
  Filled 2016-02-14: qty 2

## 2016-02-14 MED ORDER — ONDANSETRON HCL 4 MG/2ML IJ SOLN
4.0000 mg | Freq: Four times a day (QID) | INTRAMUSCULAR | Status: DC | PRN
  Administered 2016-02-16 – 2016-02-18 (×3): 4 mg via INTRAVENOUS
  Filled 2016-02-14 (×2): qty 2

## 2016-02-14 MED ORDER — SODIUM CHLORIDE 0.9% FLUSH
3.0000 mL | Freq: Two times a day (BID) | INTRAVENOUS | Status: DC
  Administered 2016-02-14 – 2016-02-20 (×6): 3 mL via INTRAVENOUS

## 2016-02-14 MED ORDER — ONDANSETRON HCL 4 MG PO TABS: 4.0000 mg | ORAL_TABLET | Freq: Four times a day (QID) | ORAL | Status: DC | PRN

## 2016-02-14 MED ORDER — HEPARIN SODIUM (PORCINE) 5000 UNIT/ML IJ SOLN
5000.0000 [IU] | Freq: Three times a day (TID) | INTRAMUSCULAR | Status: AC
  Administered 2016-02-14 – 2016-02-16 (×5): 5000 [IU] via SUBCUTANEOUS
  Filled 2016-02-14 (×5): qty 1

## 2016-02-14 NOTE — ED Notes (Signed)
Attempted to give report RN to call back  

## 2016-02-14 NOTE — ED Provider Notes (Signed)
[facility] DEPT Provider Note   CSN: [id number] Arrival date & time: 02/14/16  1418     History   Chief Complaint Chief Complaint  Patient presents with  . Abdominal Pain    pt after eating some cereal having lower mid abdominal pain     HPI Allen Wheeler is a 82 y.o. male with a history of GERD, depression, nephrolithiasis, presents to the emergency department noting the acute onset of right upper and left upper quadrant abdominal pain that he states feels like a sharp, pressure-like band sensation over his upper abdomen. He states that this pain started this morning after eating breakfast of cereal. He states his pain has been constant ever since the onset and associated with nausea and vomiting nonbloody emesis, but no diarrhea, hematochezia, or melena. He denies any associated fever, chills, chest pain, shortness of breath, chest tightness, dysuria, hematuria. He denies any history of similar symptoms. Denies any history of prior cholecystectomy or appendectomy nor history of prior bowel obstructions.  HPI  Past Medical History:  Diagnosis Date  . Allergy   . Anxiety   . Chickenpox   . Depression   . GERD (gastroesophageal reflux disease)   . Hearing loss    wears hearing aids  . Nephrolithiasis   . Normal cardiac stress test 09-20-12  . Osteoarthritis     Patient Active Problem List   Diagnosis Date Noted  . Sepsis ([place]) 02/15/2016  . Cholelithiasis with acute cholangitis with biliary obstruction 02/14/2016  . Hypothyroidism 12/13/2015  . Eczema 09/16/2014  . Abnormal ECG 09/03/2012  . SOMNOLENCE 10/31/2009  . OTITIS EXTERNA 07/04/2009  . BENIGN PROSTATIC HYPERTROPHY, WITH OBSTRUCTION 02/16/2009  . Pruritic disorder 02/16/2009  . WEAKNESS 02/16/2009  . ELEVATED BLOOD PRESSURE 02/16/2009  . Anxiety 05/27/2008  . Depression 05/27/2008  . ALLERGIC RHINITIS 05/27/2008  . GERD 05/27/2008  . Osteoarthritis 05/27/2008  . NEPHROLITHIASIS, HX OF 05/27/2008     Past Surgical History:  Procedure Laterality Date  . HERNIA REPAIR     wears truss  . MASTOIDECTOMY         Home Medications    Prior to Admission medications   Medication Sig Start Date End Date Taking? Authorizing Provider  azelastine (ASTELIN) 0.1 % nasal spray USE 2 SPRAYS EACH NOSTRIL TWICE A DAY. 05/04/15  Yes [name], MD  citalopram (CELEXA) 10 MG tablet Take 1 tablet (10 mg total) by mouth daily. Patient taking differently: Take 20 mg by mouth daily.  07/25/15  Yes [name], MD  Difluprednate (DUREZOL) 0.05 % EMUL Place 1 drop into both eyes 2 (two) times daily.    Yes Historical Provider, MD  fluticasone (FLONASE) 50 MCG/ACT nasal spray USE 2 SPRAYS IN EACH NOSTRIL ONCE DAILY 01/20/16  Yes Dorothyann Peng, NP  gabapentin (NEURONTIN) 100 MG capsule TAKE 1 CAPSULE AT BEDTIME. 10/10/15  Yes [name], MD  levothyroxine (SYNTHROID, LEVOTHROID) 50 MCG tablet Take 1 tablet (50 mcg total) by mouth daily. 07/28/15  Yes [name], MD  LORazepam (ATIVAN) 1 MG tablet TAKE 1 TABLET AT BEDTIME AS NEEDED. Patient taking differently: TAKE 1 TABLET AT BEDTIME 11/25/15  Yes [name], MD  magnesium oxide (MAG-OX) 400 MG tablet Take 400 mg by mouth daily.   Yes Historical Provider, MD  ofloxacin (OCUFLOX) 0.3 % ophthalmic solution Place 1 drop into both eyes 2 (two) times daily.   Yes Historical Provider, MD  omeprazole (PRILOSEC) 40 MG capsule TAKE 1 CAPSULE DAILY. 01/16/16  Yes [name], MD  polyethylene glycol [facility] / GLYCOLAX) packet Take 17 g by mouth daily as needed for moderate constipation.   Yes Historical Provider, MD  psyllium (HYDROCIL/METAMUCIL) 95 % PACK Take 1 packet by mouth daily as needed for moderate constipation.   Yes Historical Provider, MD  ciprofloxacin-dexamethasone (CIPRODEX) otic suspension Place 4 drops into both ears 2 (two) times daily. Patient not taking: Reported on 02/14/2016 07/25/15   [name], MD  HYDROcodone-homatropine  (HYDROMET) 5-1.5 MG/5ML syrup Take 5 mLs by mouth every 4 (four) hours as needed. Patient not taking: Reported on 02/14/2016 03/30/15   [name], MD    Family History Family History  Problem Relation Age of Onset  . Heart disease Mother     Pacemaker  . Colon cancer Father     Social History Social History  Substance Use Topics  . Smoking status: Former Research scientist (life sciences)  . Smokeless tobacco: Never Used  . Alcohol use 10.8 oz/week    14 Shots of liquor, 4 Glasses of wine per week     Allergies   Patient has no known allergies.   Review of Systems Review of Systems  Constitutional: Positive for activity change and appetite change. Negative for chills and fever.  Respiratory: Negative for cough, chest tightness and shortness of breath.   Cardiovascular: Negative for chest pain.  Gastrointestinal: Positive for abdominal pain, nausea and vomiting. Negative for abdominal distention, blood in stool and diarrhea.  Genitourinary: Negative for difficulty urinating, dysuria, flank pain, hematuria, penile pain, testicular pain and urgency.  Musculoskeletal: Negative for back pain, myalgias and neck pain.  Skin: Negative for rash.  Neurological: Negative for syncope, light-headedness and headaches.  All other systems reviewed and are negative.    Physical Exam Updated Vital Signs BP 115/63 (BP Location: Right Arm)   Pulse 63   Temp 99.5 F (37.5 C) (Oral)   Resp 24   Ht 5\' 6"  (1.676 m)   Wt 65.8 kg   SpO2 93%   BMI 23.40 kg/m   Physical Exam  Constitutional: He is oriented to person, place, and time. He appears well-developed and well-nourished. He appears distressed (due to pain).  HENT:  Head: Normocephalic and atraumatic.  Nose: Nose normal.  Mouth/Throat: Oropharynx is clear and moist.  Eyes: Conjunctivae and EOM are normal. Pupils are equal, round, and reactive to light.  Neck: Neck supple.  Cardiovascular: Normal rate, regular rhythm, normal heart sounds and intact  distal pulses.   Pulmonary/Chest: Effort normal and breath sounds normal. He exhibits no tenderness.  Abdominal: Soft. He exhibits no distension. There is no tenderness. There is no rigidity, no rebound, no guarding, no CVA tenderness, no tenderness at McBurney's point and negative Murphy's sign.  Patient describes pain in a band like distribution across upper abdomen but surprisingly nontender to palpation. Neg murphy's.  Musculoskeletal: He exhibits no edema or tenderness.  Neurological: He is alert and oriented to person, place, and time. No cranial nerve deficit or sensory deficit. Coordination normal.  Skin: Skin is warm and dry. He is not diaphoretic.  Nursing note and vitals reviewed.    ED Treatments / Results  Labs (all labs ordered are listed, but only abnormal results are displayed) Labs Reviewed  COMPREHENSIVE METABOLIC PANEL - Abnormal; Notable for the following:       Result Value   Glucose, Bld 179 (*)    BUN 22 (*)    Creatinine, Ser 1.31 (*)    AST 356 (*)  ALT 156 (*)    Alkaline Phosphatase 152 (*)    Total Bilirubin 2.3 (*)    GFR calc non Af Amer 45 (*)    GFR calc Af Amer 52 (*)    All other components within normal limits  CBC - Abnormal; Notable for the following:    WBC 14.2 (*)    All other components within normal limits  URINALYSIS, ROUTINE W REFLEX MICROSCOPIC (NOT AT [facility]) - Abnormal; Notable for the following:    Ketones, ur 40 (*)    All other components within normal limits  COMPREHENSIVE METABOLIC PANEL - Abnormal; Notable for the following:    Glucose, Bld 131 (*)    Creatinine, Ser 1.55 (*)    Albumin 3.0 (*)    AST 1,149 (*)    ALT 823 (*)    Alkaline Phosphatase 170 (*)    Total Bilirubin 4.3 (*)    GFR calc non Af Amer 37 (*)    GFR calc Af Amer 42 (*)    All other components within normal limits  CBC WITH DIFFERENTIAL/PLATELET - Abnormal; Notable for the following:    WBC 14.0 (*)    RBC 4.18 (*)    Neutro Abs 12.4 (*)    All  other components within normal limits  GLUCOSE, CAPILLARY - Abnormal; Notable for the following:    Glucose-Capillary 179 (*)    All other components within normal limits  PROTIME-INR - Abnormal; Notable for the following:    Prothrombin Time 15.3 (*)    All other components within normal limits  APTT - Abnormal; Notable for the following:    aPTT 41 (*)    All other components within normal limits  GLUCOSE, CAPILLARY - Abnormal; Notable for the following:    Glucose-Capillary 127 (*)    All other components within normal limits  LIPASE, BLOOD  COMPREHENSIVE METABOLIC PANEL  CBC WITH DIFFERENTIAL/PLATELET  I-STAT TROPOININ, ED    EKG  EKG Interpretation  Date/Time:  Tuesday February 14 2016 14:27:12 EST Ventricular Rate:  62 PR Interval:    QRS Duration: 148 QT Interval:  463 QTC Calculation: 471 R Axis:   3 Text Interpretation:  Sinus rhythm Atrial premature complex Left bundle branch block No old tracing to compare Confirmed by [facility] MD, JULIE [phone]) on 02/14/2016 2:59:43 PM       Radiology Dg Chest 2 View  Result Date: 02/14/2016 CLINICAL DATA:  Epigastric abdominal pain beginning this morning. Nausea. Former smoker. EXAM: CHEST  2 VIEW COMPARISON:  12/22/2013 FINDINGS: The patient is mildly rotated to the right. Cardiomediastinal silhouette is unchanged within this limitation. Normal heart size. There is mild interstitial coarsening which has mildly increased from the prior study. Mild right basilar opacity is present. A 9 mm nodule projects in the left lung base. No pleural effusion or pneumothorax is seen. No acute osseous abnormality is identified. IMPRESSION: 1. Mild bronchitic changes. 2. Mild right basilar opacity, likely atelectasis. 3. Left lung base nodule. See CT abdomen and pelvis report from today. Electronically Signed   By: Logan Bores M.D.   On: 02/14/2016 17:14   Ct Abdomen Pelvis W Contrast  Addendum Date: 02/14/2016   ADDENDUM REPORT: 02/14/2016  17:46 ADDENDUM: Bilateral right greater than left fat filled inguinal hernias Electronically Signed   By: Donavan Foil M.D.   On: 02/14/2016 17:46   Result Date: 02/14/2016 CLINICAL DATA:  Upper abdominal pain after eating cereal.  Nausea EXAM: CT ABDOMEN AND PELVIS WITH CONTRAST TECHNIQUE:  Multidetector CT imaging of the abdomen and pelvis was performed using the standard protocol following bolus administration of intravenous contrast. CONTRAST:  72mL ISOVUE-300 IOPAMIDOL (ISOVUE-300) INJECTION 61% COMPARISON:  None. FINDINGS: Lower chest: There is patchy dependent atelectasis. Mild right middle lobe and lingular atelectasis or scarring. No pleural effusions. There are multiple pulmonary nodules visualized at the lung bases. There it is a a 3 mm left upper lobe lateral lung nodule, series 205, image number 3. There is a a 4 mm nodule along the left fissure, series 205, image number 7. There is a 8 mm pulmonary nodule within the superior portion of left lower lobe, series 205, image number 1. There is additional 3 mm left subpleural posterior nodule, series 205, image number 9. There is a a 6 mm subpleural right lower lobe pulmonary nodule series 205, image number 5. Mild cardiomegaly. Coronary artery calcifications. Aortic valvular calcifications. Hepatobiliary: Mild intra hepatic biliary dilatation. Multiple calcified granuloma. The gallbladder is slightly enlarged. There are multiple small stones within the gallbladder. There is mild enlargement of the extrahepatic common bile duct, measuring up to 9 mm. There is high density material or possible stones within the distal common bile duct at the head of the pancreas, series 201, image number 40 and coronal series 203, image number 68. Pancreas: Unremarkable. No pancreatic ductal dilatation or surrounding inflammatory changes. Spleen: Multiple calcified splenic granuloma. Spleen otherwise unremarkable. Adrenals/Urinary Tract: Adrenal glands are within normal  limits. There are multiple hypodense lesions within the kidneys, many of which are too small to further characterize but favor cysts. The bladder is unremarkable. Stomach/Bowel: No dilated large or small bowel. Stomach grossly unremarkable. Appendix visualized and within normal limits. Vascular/Lymphatic: Ectatic abdominal aorta with mural thrombus and atherosclerosis. There is ectasia of the iliac arteries. No significantly enlarged pelvic or abdominal lymph nodes. Reproductive: Prostate gland is enlarged with calcifications. Other: No free air or free fluid Musculoskeletal: Degenerative changes of the spine. No suspicious osseous lesions. IMPRESSION: 1. Slightly enlarged gallbladder which contains small calcified stones. There is mild intra and extrahepatic biliary dilatation with small amount of high density material or possible stone within the distal common bile duct at the head of the pancreas. Recommend correlation with laboratory values, if obstruction is suggested, further evaluation with MR or ERCP may be obtained. 2. Multiple calcified granulomas within the liver and spleen. 3. Multiple pulmonary nodules within the bilateral lung bases. No follow-up needed if patient is low-risk (and has no known or suspected primary neoplasm). Non-contrast chest CT can be considered in 12 months if patient is high-risk. This recommendation follows the consensus statement: Guidelines for Management of Incidental Pulmonary Nodules Detected on CT Images: From the Fleischner Society 2017; Radiology 2017; 284:228-243. Electronically Signed: By: Donavan Foil M.D. On: 02/14/2016 17:10   US Abdomen Limited Ruq  Result Date: 02/14/2016 CLINICAL DATA:  Right upper quadrant pain for 1 day. EXAM: US ABDOMEN LIMITED - RIGHT UPPER QUADRANT COMPARISON:  CT abdomen and pelvis 02/14/2016 FINDINGS: Gallbladder: The gallbladder wall thickening (or combination of gallbladder wall and trace pericholecystic fluid) measures up to 7 mm in  thickness. Small amount of gallbladder sludge. No sonographic Murphy sign noted by sonographer. Common bile duct: Diameter: 8 mm.  Distal portion not well visualized. Liver: Mild intrahepatic biliary dilatation. No focal liver lesion identified, however the left lobe was suboptimally visualized due to bowel gas. IMPRESSION: 1. Small amount of gallbladder sludge with mild gallbladder wall thickening (and/or trace pericholecystic fluid). Acute cholecystitis is not  excluded in the appropriate clinical setting, however sonographic Murphy sign was negative. 2. Mild intrahepatic and borderline extrahepatic biliary dilatation. The distal common bile duct was not well seen. MRCP or ERCP could be considered to further evaluate the possible distal common duct stone or other debris described on CT. Electronically Signed   By: Logan Bores M.D.   On: 02/14/2016 18:06    Procedures Procedures (including critical care time)  Medications Ordered in ED Medications  piperacillin-tazobactam (ZOSYN) IVPB 3.375 g (3.375 g Intravenous Given 02/15/16 0533)  fentaNYL (SUBLIMAZE) injection 25 mcg (not administered)  heparin injection 5,000 Units (5,000 Units Subcutaneous Given 02/15/16 0534)  sodium chloride flush (NS) 0.9 % injection 3 mL (3 mLs Intravenous Given 02/14/16 2336)  ondansetron (ZOFRAN) tablet 4 mg (not administered)    Or  ondansetron (ZOFRAN) injection 4 mg (not administered)  0.9 %  sodium chloride infusion ( Intravenous Restarted 02/15/16 0514)  famotidine (PEPCID) IVPB 20 mg premix (20 mg Intravenous Given 02/15/16 0123)  LORazepam (ATIVAN) injection 0.5 mg (not administered)  citalopram (CELEXA) tablet 20 mg (not administered)  gabapentin (NEURONTIN) capsule 100 mg (100 mg Oral Given 02/15/16 0304)  levothyroxine (SYNTHROID, LEVOTHROID) injection 25 mcg (not administered)  fentaNYL (SUBLIMAZE) injection 50 mcg (50 mcg Intravenous Given 02/14/16 1458)  gi cocktail (Maalox,Lidocaine,Donnatal) (30 mLs Oral  Given 02/14/16 1515)  sodium chloride 0.9 % bolus 1,000 mL (0 mLs Intravenous Stopped 02/14/16 1838)  iopamidol (ISOVUE-300) 61 % injection (80 mLs  Contrast Given 02/14/16 1631)  fentaNYL (SUBLIMAZE) injection 50 mcg (50 mcg Intravenous Given 02/14/16 1836)  sodium chloride 0.9 % bolus 1,000 mL (1,000 mLs Intravenous Given 02/15/16 0305)  gadobenate dimeglumine (MULTIHANCE) injection 10 mL (10 mLs Intravenous Contrast Given 02/15/16 1215)     Initial Impression / Assessment and Plan / ED Course  I have reviewed the triage vital signs and the nursing notes.  Pertinent labs & imaging results that were available during my care of the patient were reviewed by me and considered in my medical decision making (see chart for details).  Clinical Course    82 year old gentleman presents with acute onset of upper abdominal pain.  Exam as above with patient noting upper abdominal pain but soft abdomen, nontender to palpation.  Labs were drawn and returned showing evidence of leukocytosis with WBC of 14.2, as well as elevated transaminases with AST 56, ALP 156, alkaline phosphatase 152, T bili 2.3. Creatinine 1.31, BUN 122. Negative troponin. EKG shows left bundle branch block with PACs. No previous EKGS to compare.   General surgery was consulted and saw the patient at the bedside. He was then admitted to the hospitalist for further care and assessment, possible ERCP/MRCP.    Final Clinical Impressions(s) / ED Diagnoses   Final diagnoses:  RUQ abdominal pain  Cholelithiasis with acute cholangitis with biliary obstruction    New Prescriptions Current Discharge Medication List       Zenovia Jarred, DO 02/15/16 [place], MD 02/16/16 1314

## 2016-02-14 NOTE — Progress Notes (Signed)
Pharmacy Antibiotic Note  Allen Wheeler is a 82 y.o. male admitted on 02/14/2016 with abdominal pain.  Pharmacy has been consulted for Zosyn dosing for intra-abdominal infection -> cholecystitis with possible cholangitis.  My need gallbladder removed.  Tmax 101.3, WBC 14.2, transaminases elevated.  Plan:  Zosyn 3.375 gm IV q8hrs (each over 4 hours).  Will follow renal function, progress, plans.  Height: 5\' 6"  (167.6 cm) Weight: 145 lb (65.8 kg) IBW/kg (Calculated) : 63.8  Temp (24hrs), Avg:99.4 F (37.4 C), Min:97.4 F (36.3 C), Max:101.3 F (38.5 C)   Recent Labs Lab 02/14/16 1425  WBC 14.2*  CREATININE 1.31*    Estimated Creatinine Clearance: 31.1 mL/min (by C-G formula based on SCr of 1.31 mg/dL (H)).    No Known Allergies  Antimicrobials this admission:  Zosyn 11/7>>  Dose adjustments this admission:  n/a  Microbiology results:  no cultures  Thank you for allowing pharmacy to be a part of this patient's care.  Arty Baumgartner, [place] Pager: [phone] 02/14/2016 8:51 PM

## 2016-02-14 NOTE — H&P (Signed)
History and Physical    Allen Wheeler [id number] DOB: January 16, 1920 DOA: 02/14/2016  PCP: [name], MD   Patient coming from: Home.  Chief Complaint: Abdominal Pain.  HPI: Allen Wheeler is a 82 y.o. male with medical history significant of seasonal allergies, anxiety, depression, GERD, nephrolithiasis, osteoarthritis who is coming to the emergency department with complaints of abdominal pain associated with persistent nausea and dry heaving after eating breakfast this morning. He denies fever, chills, chest pain, dyspnea, palpitations, dizziness, emesis, diarrhea, constipation melena or hematochezia. He denies GU symptoms. He was admitted in September 2015 for similar symptoms.  ED Course: The patient received fentanyl, Zofran and IV fluids. Workup shows WBC of 14.2, hepatic panel function with AST of 356, ALT of 156, alkaline phosphatase 152 units. His total bilirubin of 2.3 mg/dL.  Review of Systems: As per HPI otherwise 10 point review of systems negative.    Past Medical History:  Diagnosis Date  . Allergy   . Anxiety   . Chickenpox   . Depression   . GERD (gastroesophageal reflux disease)   . Hearing loss    wears hearing aids  . Nephrolithiasis   . Normal cardiac stress test 09-20-12  . Osteoarthritis     Past Surgical History:  Procedure Laterality Date  . HERNIA REPAIR     wears truss  . MASTOIDECTOMY       reports that he has quit smoking. He has never used smokeless tobacco. He reports that he drinks about 10.8 oz of alcohol per week . He reports that he does not use drugs.  No Known Allergies  Family History  Problem Relation Age of Onset  . Heart disease Mother     Pacemaker  . Colon cancer Father     Prior to Admission medications   Medication Sig Start Date End Date Taking? Authorizing Provider  azelastine (ASTELIN) 0.1 % nasal spray USE 2 SPRAYS EACH NOSTRIL TWICE A DAY. 05/04/15  Yes [name], MD  citalopram (CELEXA) 10 MG tablet Take 1  tablet (10 mg total) by mouth daily. Patient taking differently: Take 20 mg by mouth daily.  07/25/15  Yes [name], MD  Difluprednate (DUREZOL) 0.05 % EMUL Place 1 drop into both eyes 2 (two) times daily.    Yes Historical Provider, MD  fluticasone (FLONASE) 50 MCG/ACT nasal spray USE 2 SPRAYS IN EACH NOSTRIL ONCE DAILY 01/20/16  Yes Dorothyann Peng, NP  gabapentin (NEURONTIN) 100 MG capsule TAKE 1 CAPSULE AT BEDTIME. 10/10/15  Yes [name], MD  levothyroxine (SYNTHROID, LEVOTHROID) 50 MCG tablet Take 1 tablet (50 mcg total) by mouth daily. 07/28/15  Yes [name], MD  LORazepam (ATIVAN) 1 MG tablet TAKE 1 TABLET AT BEDTIME AS NEEDED. Patient taking differently: TAKE 1 TABLET AT BEDTIME 11/25/15  Yes [name], MD  magnesium oxide (MAG-OX) 400 MG tablet Take 400 mg by mouth daily.   Yes Historical Provider, MD  ofloxacin (OCUFLOX) 0.3 % ophthalmic solution Place 1 drop into both eyes 2 (two) times daily.   Yes Historical Provider, MD  omeprazole (PRILOSEC) 40 MG capsule TAKE 1 CAPSULE DAILY. 01/16/16  Yes [name], MD  polyethylene glycol [facility]) packet Take 17 g by mouth daily as needed for moderate constipation.   Yes Historical Provider, MD  psyllium (HYDROCIL/METAMUCIL) 95 % PACK Take 1 packet by mouth daily as needed for moderate constipation.   Yes Historical Provider, MD  ciprofloxacin-dexamethasone (CIPRODEX) otic suspension Place 4 drops  into both ears 2 (two) times daily. Patient not taking: Reported on 02/14/2016 07/25/15   [name], MD  HYDROcodone-homatropine (HYDROMET) 5-1.5 MG/5ML syrup Take 5 mLs by mouth every 4 (four) hours as needed. Patient not taking: Reported on 02/14/2016 03/30/15   [name], MD    Physical Exam:  Constitutional: NAD, calm, comfortable Vitals:   02/14/16 1915 02/14/16 1945 02/14/16 2000 02/14/16 2031  BP: 125/70 113/56 120/70 (!) 111/58  Pulse: 82 80 95 87  Resp: 21 17 24    Temp:    (!) 101.3 F (38.5 C)    TempSrc:    Oral  SpO2: 95% 96% 94% 96%  Weight:      Height:       Eyes: PERRL, lids and conjunctivae normal ENMT: Mucous membranes are moist. Posterior pharynx clear of any exudate or lesions. Neck: normal, supple, no masses, no thyromegaly Respiratory: clear to auscultation bilaterally, no wheezing, no crackles. Normal respiratory effort. No accessory muscle use.  Cardiovascular: Regular rate and rhythm, no murmurs / rubs / gallops. No extremity edema. 2+ pedal pulses. No carotid bruits.  Abdomen: Soft, mild RUQ tenderness, no guarding/rebound/ masses palpated. No hepatosplenomegaly. Bowel sounds positive.  Musculoskeletal: no clubbing / cyanosis. No joint deformity upper and lower extremities. Good ROM, no contractures. Normal muscle tone.  Skin: no rashes, lesions, ulcers. No induration Neurologic: CN 2-12 grossly intact. Sensation intact, DTR normal. Strength 5/5 in all 4.  Psychiatric: Normal judgment and insight. Alert and oriented x 4. Normal mood.     Labs on Admission: I have personally reviewed following labs and imaging studies  CBC:  Recent Labs Lab 02/14/16 1425  WBC 14.2*  HGB 15.2  HCT 44.4  MCV 93.9  PLT [id number]   Basic Metabolic Panel:  Recent Labs Lab 02/14/16 1425  NA 137  K 4.2  CL 103  CO2 22  GLUCOSE 179*  BUN 22*  CREATININE 1.31*  CALCIUM 9.8   GFR: Estimated Creatinine Clearance: 31.1 mL/min (by C-G formula based on SCr of 1.31 mg/dL (H)). Liver Function Tests:  Recent Labs Lab 02/14/16 1425  AST 356*  ALT 156*  ALKPHOS 152*  BILITOT 2.3*  PROT 8.0  ALBUMIN 3.9    Recent Labs Lab 02/14/16 1425  LIPASE 51   No results for input(s): AMMONIA in the last 168 hours. Coagulation Profile: No results for input(s): INR, PROTIME in the last 168 hours. Cardiac Enzymes: No results for input(s): CKTOTAL, CKMB, CKMBINDEX, TROPONINI in the last 168 hours. BNP (last 3 results) No results for input(s): PROBNP in the last 8760  hours. HbA1C: No results for input(s): HGBA1C in the last 72 hours. CBG: No results for input(s): GLUCAP in the last 168 hours. Lipid Profile: No results for input(s): CHOL, HDL, LDLCALC, TRIG, CHOLHDL, LDLDIRECT in the last 72 hours. Thyroid Function Tests: No results for input(s): TSH, T4TOTAL, FREET4, T3FREE, THYROIDAB in the last 72 hours. Anemia Panel: No results for input(s): VITAMINB12, FOLATE, FERRITIN, TIBC, IRON, RETICCTPCT in the last 72 hours. Urine analysis:    Component Value Date/Time   COLORURINE YELLOW 02/14/2016 1506   APPEARANCEUR CLEAR 02/14/2016 1506   LABSPEC 1.018 02/14/2016 1506   PHURINE 6.5 02/14/2016 1506   GLUCOSEU NEGATIVE 02/14/2016 1506   HGBUR NEGATIVE 02/14/2016 1506   BILIRUBINUR NEGATIVE 02/14/2016 1506   BILIRUBINUR n 07/25/2015 1236   KETONESUR 40 (A) 02/14/2016 1506   PROTEINUR NEGATIVE 02/14/2016 1506   UROBILINOGEN 0.2 07/25/2015 1236   UROBILINOGEN 1.0 12/22/2013 0341  NITRITE NEGATIVE 02/14/2016 1506   LEUKOCYTESUR NEGATIVE 02/14/2016 1506     Radiological Exams on Admission: Dg Chest 2 View  Result Date: 02/14/2016 CLINICAL DATA:  Epigastric abdominal pain beginning this morning. Nausea. Former smoker. EXAM: CHEST  2 VIEW COMPARISON:  12/22/2013 FINDINGS: The patient is mildly rotated to the right. Cardiomediastinal silhouette is unchanged within this limitation. Normal heart size. There is mild interstitial coarsening which has mildly increased from the prior study. Mild right basilar opacity is present. A 9 mm nodule projects in the left lung base. No pleural effusion or pneumothorax is seen. No acute osseous abnormality is identified. IMPRESSION: 1. Mild bronchitic changes. 2. Mild right basilar opacity, likely atelectasis. 3. Left lung base nodule. See CT abdomen and pelvis report from today. Electronically Signed   By: Logan Bores M.D.   On: 02/14/2016 17:14   Ct Abdomen Pelvis W Contrast  Addendum Date: 02/14/2016   ADDENDUM  REPORT: 02/14/2016 17:46 ADDENDUM: Bilateral right greater than left fat filled inguinal hernias Electronically Signed   By: Donavan Foil M.D.   On: 02/14/2016 17:46   Result Date: 02/14/2016 CLINICAL DATA:  Upper abdominal pain after eating cereal.  Nausea EXAM: CT ABDOMEN AND PELVIS WITH CONTRAST TECHNIQUE: Multidetector CT imaging of the abdomen and pelvis was performed using the standard protocol following bolus administration of intravenous contrast. CONTRAST:  82mL ISOVUE-300 IOPAMIDOL (ISOVUE-300) INJECTION 61% COMPARISON:  None. FINDINGS: Lower chest: There is patchy dependent atelectasis. Mild right middle lobe and lingular atelectasis or scarring. No pleural effusions. There are multiple pulmonary nodules visualized at the lung bases. There it is a a 3 mm left upper lobe lateral lung nodule, series 205, image number 3. There is a a 4 mm nodule along the left fissure, series 205, image number 7. There is a 8 mm pulmonary nodule within the superior portion of left lower lobe, series 205, image number 1. There is additional 3 mm left subpleural posterior nodule, series 205, image number 9. There is a a 6 mm subpleural right lower lobe pulmonary nodule series 205, image number 5. Mild cardiomegaly. Coronary artery calcifications. Aortic valvular calcifications. Hepatobiliary: Mild intra hepatic biliary dilatation. Multiple calcified granuloma. The gallbladder is slightly enlarged. There are multiple small stones within the gallbladder. There is mild enlargement of the extrahepatic common bile duct, measuring up to 9 mm. There is high density material or possible stones within the distal common bile duct at the head of the pancreas, series 201, image number 40 and coronal series 203, image number 68. Pancreas: Unremarkable. No pancreatic ductal dilatation or surrounding inflammatory changes. Spleen: Multiple calcified splenic granuloma. Spleen otherwise unremarkable. Adrenals/Urinary Tract: Adrenal glands  are within normal limits. There are multiple hypodense lesions within the kidneys, many of which are too small to further characterize but favor cysts. The bladder is unremarkable. Stomach/Bowel: No dilated large or small bowel. Stomach grossly unremarkable. Appendix visualized and within normal limits. Vascular/Lymphatic: Ectatic abdominal aorta with mural thrombus and atherosclerosis. There is ectasia of the iliac arteries. No significantly enlarged pelvic or abdominal lymph nodes. Reproductive: Prostate gland is enlarged with calcifications. Other: No free air or free fluid Musculoskeletal: Degenerative changes of the spine. No suspicious osseous lesions. IMPRESSION: 1. Slightly enlarged gallbladder which contains small calcified stones. There is mild intra and extrahepatic biliary dilatation with small amount of high density material or possible stone within the distal common bile duct at the head of the pancreas. Recommend correlation with laboratory values, if obstruction is suggested, further  evaluation with MR or ERCP may be obtained. 2. Multiple calcified granulomas within the liver and spleen. 3. Multiple pulmonary nodules within the bilateral lung bases. No follow-up needed if patient is low-risk (and has no known or suspected primary neoplasm). Non-contrast chest CT can be considered in 12 months if patient is high-risk. This recommendation follows the consensus statement: Guidelines for Management of Incidental Pulmonary Nodules Detected on CT Images: From the Fleischner Society 2017; Radiology 2017; 284:228-243. Electronically Signed: By: Donavan Foil M.D. On: 02/14/2016 17:10   US Abdomen Limited Ruq  Result Date: 02/14/2016 CLINICAL DATA:  Right upper quadrant pain for 1 day. EXAM: US ABDOMEN LIMITED - RIGHT UPPER QUADRANT COMPARISON:  CT abdomen and pelvis 02/14/2016 FINDINGS: Gallbladder: The gallbladder wall thickening (or combination of gallbladder wall and trace pericholecystic fluid)  measures up to 7 mm in thickness. Small amount of gallbladder sludge. No sonographic Murphy sign noted by sonographer. Common bile duct: Diameter: 8 mm.  Distal portion not well visualized. Liver: Mild intrahepatic biliary dilatation. No focal liver lesion identified, however the left lobe was suboptimally visualized due to bowel gas. IMPRESSION: 1. Small amount of gallbladder sludge with mild gallbladder wall thickening (and/or trace pericholecystic fluid). Acute cholecystitis is not excluded in the appropriate clinical setting, however sonographic Murphy sign was negative. 2. Mild intrahepatic and borderline extrahepatic biliary dilatation. The distal common bile duct was not well seen. MRCP or ERCP could be considered to further evaluate the possible distal common duct stone or other debris described on CT. Electronically Signed   By: Logan Bores M.D.   On: 02/14/2016 18:06    EKG: Independently reviewed. Vent. rate 62 BPM PR interval * ms QRS duration 148 ms QT/QTc 463/471 ms P-R-T axes 54 3 143 Sinus rhythm Atrial premature complex Left bundle branch block Previous EKG to compare to.  Assessment/Plan Principal Problem:   Cholelithiasis with acute cholangitis with biliary obstruction   Sepsis ([place]) Admit to telemetry/inpatient. Continue analgesics as needed. Continue antiemetics as needed. Continue gentle IV hydration. Famotidine 20 mg IVP every 12 hours. Zosyn per pharmacy. GI and general surgery have been consulted by the ED area  Active Problems:   Anxiety Hold scheduled lorazepam 1 mg orally while on fentanyl. Continue Celexa 20 mg by mouth daily.    Depression Continue Celexa 20 mg by mouth daily.    GERD Famotidine 20 mg IVP while nothing by mouth.    Hypothyroidism Continue levothyroxine 50 g by mouth daily. Monitor TSH at least yearly.     DVT prophylaxis: Lovenox. Code Status: Full code. Family Communication:  Disposition Plan: Admitted for IV antibiotic  therapy, symptoms management and further evaluation. Consults called: GI and general surgery were consulted by the emergency department. Admission status: Inpatient/telemetry.   Reubin Milan MD [facility] Pager [phone].  If 7PM-7AM, please contact night-coverage [email or web address] Password [facility]  02/14/2016, 10:47 PM

## 2016-02-14 NOTE — ED Triage Notes (Signed)
Abdominal pain that started this am after eating some cereal pt was given 100 of fentanyl and 4 mg IV Zofran en route pt also took zofran at home, pt is in obvious pain and nauseas and dry heaving upon arrival to ER

## 2016-02-14 NOTE — H&P (Signed)
Allen Wheeler is an 82 y.o. male.   Chief Complaint: abdominal pain HPI: 82 yo male with 1 day of abdominal pain. Pain came on suddenly. He had a similar attack 2 months ago but with negative work up. This time the pain lasted for >8h, it waxed and waned. He denies nausea or vomiting. He denies fevers. Currently the pain has subsided.  Past Medical History:  Diagnosis Date  . Allergy   . Anxiety   . Chickenpox   . Depression   . GERD (gastroesophageal reflux disease)   . Hearing loss    wears hearing aids  . Nephrolithiasis   . Normal cardiac stress test 09-20-12  . Osteoarthritis     Past Surgical History:  Procedure Laterality Date  . HERNIA REPAIR     wears truss  . MASTOIDECTOMY      Family History  Problem Relation Age of Onset  . Heart disease Mother     Pacemaker  . Colon cancer Father    Social History:  reports that he has quit smoking. He has never used smokeless tobacco. He reports that he drinks about 10.8 oz of alcohol per week . He reports that he does not use drugs.  Allergies: No Known Allergies   (Not in a hospital admission)  Results for orders placed or performed during the hospital encounter of 02/14/16 (from the past 48 hour(s))  Lipase, blood     Status: None   Collection Time: 02/14/16  2:25 PM  Result Value Ref Range   Lipase 51 11 - 51 U/L  Comprehensive metabolic panel     Status: Abnormal   Collection Time: 02/14/16  2:25 PM  Result Value Ref Range   Sodium 137 135 - 145 mmol/L   Potassium 4.2 3.5 - 5.1 mmol/L   Chloride 103 101 - 111 mmol/L   CO2 22 22 - 32 mmol/L   Glucose, Bld 179 (H) 65 - 99 mg/dL   BUN 22 (H) 6 - 20 mg/dL   Creatinine, Ser 1.31 (H) 0.61 - 1.24 mg/dL   Calcium 9.8 8.9 - 10.3 mg/dL   Total Protein 8.0 6.5 - 8.1 g/dL   Albumin 3.9 3.5 - 5.0 g/dL   AST 356 (H) 15 - 41 U/L   ALT 156 (H) 17 - 63 U/L   Alkaline Phosphatase 152 (H) 38 - 126 U/L   Total Bilirubin 2.3 (H) 0.3 - 1.2 mg/dL   GFR calc non Af Amer 45 (L)  >60 mL/min   GFR calc Af Amer 52 (L) >60 mL/min    Comment: (NOTE) The eGFR has been calculated using the CKD EPI equation. This calculation has not been validated in all clinical situations. eGFR's persistently <60 mL/min signify possible Chronic Kidney Disease.    Anion gap 12 5 - 15  CBC     Status: Abnormal   Collection Time: 02/14/16  2:25 PM  Result Value Ref Range   WBC 14.2 (H) 4.0 - 10.5 K/uL   RBC 4.73 4.22 - 5.81 MIL/uL   Hemoglobin 15.2 13.0 - 17.0 g/dL   HCT 44.4 39.0 - 52.0 %   MCV 93.9 78.0 - 100.0 fL   MCH 32.1 26.0 - 34.0 pg   MCHC 34.2 30.0 - 36.0 g/dL   RDW 13.2 11.5 - 15.5 %   Platelets 163 150 - 400 K/uL  Urinalysis, Routine w reflex microscopic     Status: Abnormal   Collection Time: 02/14/16  3:06 PM  Result Value Ref Range  Color, Urine YELLOW YELLOW   APPearance CLEAR CLEAR   Specific Gravity, Urine 1.018 1.005 - 1.030   pH 6.5 5.0 - 8.0   Glucose, UA NEGATIVE NEGATIVE mg/dL   Hgb urine dipstick NEGATIVE NEGATIVE   Bilirubin Urine NEGATIVE NEGATIVE   Ketones, ur 40 (A) NEGATIVE mg/dL   Protein, ur NEGATIVE NEGATIVE mg/dL   Nitrite NEGATIVE NEGATIVE   Leukocytes, UA NEGATIVE NEGATIVE    Comment: MICROSCOPIC NOT DONE ON URINES WITH NEGATIVE PROTEIN, BLOOD, LEUKOCYTES, NITRITE, OR GLUCOSE <1000 mg/dL.  I-Stat Troponin, ED (not at [facility])     Status: None   Collection Time: 02/14/16  3:32 PM  Result Value Ref Range   Troponin i, poc 0.00 0.00 - 0.08 ng/mL   Comment 3            Comment: Due to the release kinetics of cTnI, a negative result within the first hours of the onset of symptoms does not rule out myocardial infarction with certainty. If myocardial infarction is still suspected, repeat the test at appropriate intervals.    Dg Chest 2 View  Result Date: 02/14/2016 CLINICAL DATA:  Epigastric abdominal pain beginning this morning. Nausea. Former smoker. EXAM: CHEST  2 VIEW COMPARISON:  12/22/2013 FINDINGS: The patient is mildly rotated to the  right. Cardiomediastinal silhouette is unchanged within this limitation. Normal heart size. There is mild interstitial coarsening which has mildly increased from the prior study. Mild right basilar opacity is present. A 9 mm nodule projects in the left lung base. No pleural effusion or pneumothorax is seen. No acute osseous abnormality is identified. IMPRESSION: 1. Mild bronchitic changes. 2. Mild right basilar opacity, likely atelectasis. 3. Left lung base nodule. See CT abdomen and pelvis report from today. Electronically Signed   By: Logan Bores M.D.   On: 02/14/2016 17:14   Ct Abdomen Pelvis W Contrast  Addendum Date: 02/14/2016   ADDENDUM REPORT: 02/14/2016 17:46 ADDENDUM: Bilateral right greater than left fat filled inguinal hernias Electronically Signed   By: Donavan Foil M.D.   On: 02/14/2016 17:46   Result Date: 02/14/2016 CLINICAL DATA:  Upper abdominal pain after eating cereal.  Nausea EXAM: CT ABDOMEN AND PELVIS WITH CONTRAST TECHNIQUE: Multidetector CT imaging of the abdomen and pelvis was performed using the standard protocol following bolus administration of intravenous contrast. CONTRAST:  69m ISOVUE-300 IOPAMIDOL (ISOVUE-300) INJECTION 61% COMPARISON:  None. FINDINGS: Lower chest: There is patchy dependent atelectasis. Mild right middle lobe and lingular atelectasis or scarring. No pleural effusions. There are multiple pulmonary nodules visualized at the lung bases. There it is a a 3 mm left upper lobe lateral lung nodule, series 205, image number 3. There is a a 4 mm nodule along the left fissure, series 205, image number 7. There is a 8 mm pulmonary nodule within the superior portion of left lower lobe, series 205, image number 1. There is additional 3 mm left subpleural posterior nodule, series 205, image number 9. There is a a 6 mm subpleural right lower lobe pulmonary nodule series 205, image number 5. Mild cardiomegaly. Coronary artery calcifications. Aortic valvular calcifications.  Hepatobiliary: Mild intra hepatic biliary dilatation. Multiple calcified granuloma. The gallbladder is slightly enlarged. There are multiple small stones within the gallbladder. There is mild enlargement of the extrahepatic common bile duct, measuring up to 9 mm. There is high density material or possible stones within the distal common bile duct at the head of the pancreas, series 201, image number 40 and coronal series 203, image  number 1. Pancreas: Unremarkable. No pancreatic ductal dilatation or surrounding inflammatory changes. Spleen: Multiple calcified splenic granuloma. Spleen otherwise unremarkable. Adrenals/Urinary Tract: Adrenal glands are within normal limits. There are multiple hypodense lesions within the kidneys, many of which are too small to further characterize but favor cysts. The bladder is unremarkable. Stomach/Bowel: No dilated large or small bowel. Stomach grossly unremarkable. Appendix visualized and within normal limits. Vascular/Lymphatic: Ectatic abdominal aorta with mural thrombus and atherosclerosis. There is ectasia of the iliac arteries. No significantly enlarged pelvic or abdominal lymph nodes. Reproductive: Prostate gland is enlarged with calcifications. Other: No free air or free fluid Musculoskeletal: Degenerative changes of the spine. No suspicious osseous lesions. IMPRESSION: 1. Slightly enlarged gallbladder which contains small calcified stones. There is mild intra and extrahepatic biliary dilatation with small amount of high density material or possible stone within the distal common bile duct at the head of the pancreas. Recommend correlation with laboratory values, if obstruction is suggested, further evaluation with MR or ERCP may be obtained. 2. Multiple calcified granulomas within the liver and spleen. 3. Multiple pulmonary nodules within the bilateral lung bases. No follow-up needed if patient is low-risk (and has no known or suspected primary neoplasm). Non-contrast  chest CT can be considered in 12 months if patient is high-risk. This recommendation follows the consensus statement: Guidelines for Management of Incidental Pulmonary Nodules Detected on CT Images: From the Fleischner Society 2017; Radiology 2017; 284:228-243. Electronically Signed: By: Donavan Foil M.D. On: 02/14/2016 17:10   US Abdomen Limited Ruq  Result Date: 02/14/2016 CLINICAL DATA:  Right upper quadrant pain for 1 day. EXAM: US ABDOMEN LIMITED - RIGHT UPPER QUADRANT COMPARISON:  CT abdomen and pelvis 02/14/2016 FINDINGS: Gallbladder: The gallbladder wall thickening (or combination of gallbladder wall and trace pericholecystic fluid) measures up to 7 mm in thickness. Small amount of gallbladder sludge. No sonographic Murphy sign noted by sonographer. Common bile duct: Diameter: 8 mm.  Distal portion not well visualized. Liver: Mild intrahepatic biliary dilatation. No focal liver lesion identified, however the left lobe was suboptimally visualized due to bowel gas. IMPRESSION: 1. Small amount of gallbladder sludge with mild gallbladder wall thickening (and/or trace pericholecystic fluid). Acute cholecystitis is not excluded in the appropriate clinical setting, however sonographic Murphy sign was negative. 2. Mild intrahepatic and borderline extrahepatic biliary dilatation. The distal common bile duct was not well seen. MRCP or ERCP could be considered to further evaluate the possible distal common duct stone or other debris described on CT. Electronically Signed   By: Logan Bores M.D.   On: 02/14/2016 18:06    Review of Systems  Constitutional: Negative for chills and fever.  HENT: Negative for hearing loss, nosebleeds and sore throat.   Eyes: Negative for blurred vision and double vision.  Respiratory: Negative for cough and hemoptysis.   Cardiovascular: Negative for chest pain and palpitations.  Gastrointestinal: Positive for abdominal pain. Negative for nausea and vomiting.  Genitourinary:  Negative for dysuria and urgency.  Musculoskeletal: Negative for myalgias and neck pain.  Skin: Negative for itching and rash.  Neurological: Negative for dizziness, tingling and headaches.  Endo/Heme/Allergies: Does not bruise/bleed easily.  Psychiatric/Behavioral: Negative for depression and suicidal ideas.    Blood pressure 120/70, pulse 95, temperature 97.4 F (36.3 C), resp. rate 24, height 5' 6"  (1.676 m), weight 65.8 kg (145 lb), SpO2 94 %. Physical Exam  Nursing note and vitals reviewed. Constitutional: He is oriented to person, place, and time. He appears well-developed and well-nourished.  HENT:  Head: Normocephalic and atraumatic.  Eyes: Conjunctivae and EOM are normal. No scleral icterus.  Neck: Normal range of motion. Neck supple.  Cardiovascular: Normal rate and regular rhythm.   Respiratory: Effort normal and breath sounds normal. He has no wheezes. He has no rales. He exhibits no tenderness.  GI: Soft. He exhibits no distension. There is no tenderness. There is no rebound.  Musculoskeletal: Normal range of motion. He exhibits no edema.  Neurological: He is alert and oriented to person, place, and time.  Skin: Skin is warm and dry.  Psychiatric: He has a normal mood and affect. His behavior is normal.     Assessment/Plan 82 yo male with abdominal pain and findings consistent with choledocholithiasis with possible cholangitis. Currently with benign exam which may be an indication stone has passed or that he has had effective analgesia. -recommend admission -recommend antibiotics -recheck CBC, CMP in am, if bilirubin is higher would consult GI for ERCP -NPO -pain control -this appears to be a complication of gallstone disease and patient would likely benefit from gallbladder removal this hospitalization  Mickeal Skinner, MD 02/14/2016, 8:13 PM

## 2016-02-14 NOTE — Progress Notes (Signed)
Received patient from ED. Patient AOx4, ambulatory, VS stable, O2Sat at 96% on RA, and denies any pain..  CNA oriented patient to room, bed controls and call light. Will closely monitor.

## 2016-02-15 ENCOUNTER — Inpatient Hospital Stay (HOSPITAL_COMMUNITY): Payer: Medicare Other

## 2016-02-15 DIAGNOSIS — A419 Sepsis, unspecified organism: Secondary | ICD-10-CM | POA: Diagnosis present

## 2016-02-15 DIAGNOSIS — E039 Hypothyroidism, unspecified: Secondary | ICD-10-CM

## 2016-02-15 DIAGNOSIS — K219 Gastro-esophageal reflux disease without esophagitis: Secondary | ICD-10-CM

## 2016-02-15 DIAGNOSIS — F411 Generalized anxiety disorder: Secondary | ICD-10-CM

## 2016-02-15 LAB — GLUCOSE, CAPILLARY
GLUCOSE-CAPILLARY: 109 mg/dL — AB (ref 65–99)
GLUCOSE-CAPILLARY: 127 mg/dL — AB (ref 65–99)
GLUCOSE-CAPILLARY: 179 mg/dL — AB (ref 65–99)

## 2016-02-15 LAB — CBC WITH DIFFERENTIAL/PLATELET
Basophils Absolute: 0 K/uL (ref 0.0–0.1)
Basophils Relative: 0 %
Eosinophils Absolute: 0 K/uL (ref 0.0–0.7)
Eosinophils Relative: 0 %
HCT: 39.2 % (ref 39.0–52.0)
Hemoglobin: 13.4 g/dL (ref 13.0–17.0)
Lymphocytes Relative: 7 %
Lymphs Abs: 1 K/uL (ref 0.7–4.0)
MCH: 32.1 pg (ref 26.0–34.0)
MCHC: 34.2 g/dL (ref 30.0–36.0)
MCV: 93.8 fL (ref 78.0–100.0)
Monocytes Absolute: 0.5 K/uL (ref 0.1–1.0)
Monocytes Relative: 4 %
Neutro Abs: 12.4 K/uL — ABNORMAL HIGH (ref 1.7–7.7)
Neutrophils Relative %: 89 %
Platelets: 154 K/uL (ref 150–400)
RBC: 4.18 MIL/uL — ABNORMAL LOW (ref 4.22–5.81)
RDW: 13.5 % (ref 11.5–15.5)
WBC: 14 K/uL — ABNORMAL HIGH (ref 4.0–10.5)

## 2016-02-15 LAB — COMPREHENSIVE METABOLIC PANEL WITH GFR
ALT: 823 U/L — ABNORMAL HIGH (ref 17–63)
AST: 1149 U/L — ABNORMAL HIGH (ref 15–41)
Albumin: 3 g/dL — ABNORMAL LOW (ref 3.5–5.0)
Alkaline Phosphatase: 170 U/L — ABNORMAL HIGH (ref 38–126)
Anion gap: 9 (ref 5–15)
BUN: 16 mg/dL (ref 6–20)
CO2: 22 mmol/L (ref 22–32)
Calcium: 9 mg/dL (ref 8.9–10.3)
Chloride: 106 mmol/L (ref 101–111)
Creatinine, Ser: 1.55 mg/dL — ABNORMAL HIGH (ref 0.61–1.24)
GFR calc Af Amer: 42 mL/min — ABNORMAL LOW
GFR calc non Af Amer: 37 mL/min — ABNORMAL LOW
Glucose, Bld: 131 mg/dL — ABNORMAL HIGH (ref 65–99)
Potassium: 4.8 mmol/L (ref 3.5–5.1)
Sodium: 137 mmol/L (ref 135–145)
Total Bilirubin: 4.3 mg/dL — ABNORMAL HIGH (ref 0.3–1.2)
Total Protein: 6.6 g/dL (ref 6.5–8.1)

## 2016-02-15 LAB — PROTIME-INR
INR: 1.2
Prothrombin Time: 15.3 seconds — ABNORMAL HIGH (ref 11.4–15.2)

## 2016-02-15 LAB — APTT: aPTT: 41 seconds — ABNORMAL HIGH (ref 24–36)

## 2016-02-15 MED ORDER — LORAZEPAM 2 MG/ML IJ SOLN: 0.5000 mg | INTRAMUSCULAR | Status: DC | PRN

## 2016-02-15 MED ORDER — SODIUM CHLORIDE 0.9 % IV SOLN
INTRAVENOUS | Status: DC
  Administered 2016-02-15 – 2016-02-19 (×6): via INTRAVENOUS

## 2016-02-15 MED ORDER — LEVOTHYROXINE SODIUM 50 MCG PO TABS
50.0000 ug | ORAL_TABLET | Freq: Every day | ORAL | Status: DC
  Administered 2016-02-15: 50 ug via ORAL
  Filled 2016-02-15: qty 1

## 2016-02-15 MED ORDER — FAMOTIDINE IN NACL 20-0.9 MG/50ML-% IV SOLN
20.0000 mg | Freq: Two times a day (BID) | INTRAVENOUS | Status: DC
  Administered 2016-02-15 – 2016-02-18 (×8): 20 mg via INTRAVENOUS
  Filled 2016-02-15 (×9): qty 50

## 2016-02-15 MED ORDER — CITALOPRAM HYDROBROMIDE 20 MG PO TABS
20.0000 mg | ORAL_TABLET | Freq: Every day | ORAL | Status: DC
  Administered 2016-02-15 – 2016-02-19 (×5): 20 mg via ORAL
  Filled 2016-02-15 (×6): qty 1

## 2016-02-15 MED ORDER — LEVOTHYROXINE SODIUM 100 MCG IV SOLR
25.0000 ug | Freq: Every day | INTRAVENOUS | Status: DC
  Administered 2016-02-16 – 2016-02-18 (×3): 25 ug via INTRAVENOUS
  Filled 2016-02-15 (×3): qty 5

## 2016-02-15 MED ORDER — GABAPENTIN 100 MG PO CAPS
100.0000 mg | ORAL_CAPSULE | Freq: Every day | ORAL | Status: DC
  Administered 2016-02-15 – 2016-02-19 (×6): 100 mg via ORAL
  Filled 2016-02-15 (×6): qty 1

## 2016-02-15 MED ORDER — SODIUM CHLORIDE 0.9 % IV BOLUS (SEPSIS)
1000.0000 mL | Freq: Once | INTRAVENOUS | Status: AC
  Administered 2016-02-15: 1000 mL via INTRAVENOUS

## 2016-02-15 MED ORDER — GADOBENATE DIMEGLUMINE 529 MG/ML IV SOLN
10.0000 mL | Freq: Once | INTRAVENOUS | Status: AC
  Administered 2016-02-15: 10 mL via INTRAVENOUS

## 2016-02-15 NOTE — Consult Note (Addendum)
[facility] Consult Note  Referring Provider: No ref. provider found Primary Care Physician:  [name], MD Primary Gastroenterologist:  Dr.  Laurel Dimmer Complaint: Abdominal pain HPI: Allen Wheeler is an 82 y.o.   male  OFF THE FLOOR admitted with 1 day history of abdominal pain without nausea or vomiting. He was found to have gallstones on CT scan and probable common bile duct stones. He has had an overnight rise in his LFTs. He is reportedly pain free this morning. He is out of his room presumably getting an MRCP.  Past Medical History:  Diagnosis Date  . Allergy   . Anxiety   . Chickenpox   . Depression   . GERD (gastroesophageal reflux disease)   . Hearing loss    wears hearing aids  . Nephrolithiasis   . Normal cardiac stress test 09-20-12  . Osteoarthritis     Past Surgical History:  Procedure Laterality Date  . HERNIA REPAIR     wears truss  . MASTOIDECTOMY      Medications Prior to Admission  Medication Sig Dispense Refill  . azelastine (ASTELIN) 0.1 % nasal spray USE 2 SPRAYS EACH NOSTRIL TWICE A DAY. 30 mL 11  . citalopram (CELEXA) 10 MG tablet Take 1 tablet (10 mg total) by mouth daily. (Patient taking differently: Take 20 mg by mouth daily. ) 30 tablet 11  . Difluprednate (DUREZOL) 0.05 % EMUL Place 1 drop into both eyes 2 (two) times daily.     . fluticasone (FLONASE) 50 MCG/ACT nasal spray USE 2 SPRAYS IN EACH NOSTRIL ONCE DAILY 16 g 0  . gabapentin (NEURONTIN) 100 MG capsule TAKE 1 CAPSULE AT BEDTIME. 30 capsule 3  . levothyroxine (SYNTHROID, LEVOTHROID) 50 MCG tablet Take 1 tablet (50 mcg total) by mouth daily. 90 tablet 3  . LORazepam (ATIVAN) 1 MG tablet TAKE 1 TABLET AT BEDTIME AS NEEDED. (Patient taking differently: TAKE 1 TABLET AT BEDTIME) 30 tablet 5  . magnesium oxide (MAG-OX) 400 MG tablet Take 400 mg by mouth daily.    Marland Kitchen ofloxacin (OCUFLOX) 0.3 % ophthalmic solution Place 1 drop into both eyes 2 (two) times daily.    Marland Kitchen omeprazole  (PRILOSEC) 40 MG capsule TAKE 1 CAPSULE DAILY. 90 capsule 1  . polyethylene glycol (MIRALAX / GLYCOLAX) packet Take 17 g by mouth daily as needed for moderate constipation.    . psyllium (HYDROCIL/METAMUCIL) 95 % PACK Take 1 packet by mouth daily as needed for moderate constipation.    . ciprofloxacin-dexamethasone (CIPRODEX) otic suspension Place 4 drops into both ears 2 (two) times daily. (Patient not taking: Reported on 02/14/2016) 7.5 mL 11  . HYDROcodone-homatropine (HYDROMET) 5-1.5 MG/5ML syrup Take 5 mLs by mouth every 4 (four) hours as needed. (Patient not taking: Reported on 02/14/2016) 240 mL 0    Allergies: No Known Allergies  Family History  Problem Relation Age of Onset  . Heart disease Mother     Pacemaker  . Colon cancer Father     Social History:  reports that he has quit smoking. He has never used smokeless tobacco. He reports that he drinks about 10.8 oz of alcohol per week . He reports that he does not use drugs.  Review of Systems: negative except unobtainable   Blood pressure 115/63, pulse 63, temperature 99.5 F (37.5 C), temperature source Oral, resp. rate 24, height _0  (1.676 m), weight 65.8 kg (145 lb), SpO2 93 %. Exam deferred  Results for orders placed or performed during the hospital encounter of  02/14/16 (from the past 48 hour(s))  Lipase, blood     Status: None   Collection Time: 02/14/16  2:25 PM  Result Value Ref Range   Lipase 51 11 - 51 U/L  Comprehensive metabolic panel     Status: Abnormal   Collection Time: 02/14/16  2:25 PM  Result Value Ref Range   Sodium 137 135 - 145 mmol/L   Potassium 4.2 3.5 - 5.1 mmol/L   Chloride 103 101 - 111 mmol/L   CO2 22 22 - 32 mmol/L   Glucose, Bld 179 (H) 65 - 99 mg/dL   BUN 22 (H) 6 - 20 mg/dL   Creatinine, Ser 1.31 (H) 0.61 - 1.24 mg/dL   Calcium 9.8 8.9 - 10.3 mg/dL   Total Protein 8.0 6.5 - 8.1 g/dL   Albumin 3.9 3.5 - 5.0 g/dL   AST 356 (H) 15 - 41 U/L   ALT 156 (H) 17 - 63 U/L   Alkaline  Phosphatase 152 (H) 38 - 126 U/L   Total Bilirubin 2.3 (H) 0.3 - 1.2 mg/dL   GFR calc non Af Amer 45 (L) >60 mL/min   GFR calc Af Amer 52 (L) >60 mL/min    Comment: (NOTE) The eGFR has been calculated using the CKD EPI equation. This calculation has not been validated in all clinical situations. eGFR's persistently <60 mL/min signify possible Chronic Kidney Disease.    Anion gap 12 5 - 15  CBC     Status: Abnormal   Collection Time: 02/14/16  2:25 PM  Result Value Ref Range   WBC 14.2 (H) 4.0 - 10.5 K/uL   RBC 4.73 4.22 - 5.81 MIL/uL   Hemoglobin 15.2 13.0 - 17.0 g/dL   HCT 44.4 39.0 - 52.0 %   MCV 93.9 78.0 - 100.0 fL   MCH 32.1 26.0 - 34.0 pg   MCHC 34.2 30.0 - 36.0 g/dL   RDW 13.2 11.5 - 15.5 %   Platelets 163 150 - 400 K/uL  Urinalysis, Routine w reflex microscopic     Status: Abnormal   Collection Time: 02/14/16  3:06 PM  Result Value Ref Range   Color, Urine YELLOW YELLOW   APPearance CLEAR CLEAR   Specific Gravity, Urine 1.018 1.005 - 1.030   pH 6.5 5.0 - 8.0   Glucose, UA NEGATIVE NEGATIVE mg/dL   Hgb urine dipstick NEGATIVE NEGATIVE   Bilirubin Urine NEGATIVE NEGATIVE   Ketones, ur 40 (A) NEGATIVE mg/dL   Protein, ur NEGATIVE NEGATIVE mg/dL   Nitrite NEGATIVE NEGATIVE   Leukocytes, UA NEGATIVE NEGATIVE    Comment: MICROSCOPIC NOT DONE ON URINES WITH NEGATIVE PROTEIN, BLOOD, LEUKOCYTES, NITRITE, OR GLUCOSE <1000 mg/dL.  I-Stat Troponin, ED (not at [facility])     Status: None   Collection Time: 02/14/16  3:32 PM  Result Value Ref Range   Troponin i, poc 0.00 0.00 - 0.08 ng/mL   Comment 3            Comment: Due to the release kinetics of cTnI, a negative result within the first hours of the onset of symptoms does not rule out myocardial infarction with certainty. If myocardial infarction is still suspected, repeat the test at appropriate intervals.   Glucose, capillary     Status: Abnormal   Collection Time: 02/15/16 12:13 AM  Result Value Ref Range    Glucose-Capillary 179 (H) 65 - 99 mg/dL  Comprehensive metabolic panel     Status: Abnormal   Collection Time: 02/15/16  5:13 AM  Result Value Ref Range   Sodium 137 135 - 145 mmol/L   Potassium 4.8 3.5 - 5.1 mmol/L   Chloride 106 101 - 111 mmol/L   CO2 22 22 - 32 mmol/L   Glucose, Bld 131 (H) 65 - 99 mg/dL   BUN 16 6 - 20 mg/dL   Creatinine, Ser 1.55 (H) 0.61 - 1.24 mg/dL   Calcium 9.0 8.9 - 10.3 mg/dL   Total Protein 6.6 6.5 - 8.1 g/dL   Albumin 3.0 (L) 3.5 - 5.0 g/dL   AST 1,149 (H) 15 - 41 U/L   ALT 823 (H) 17 - 63 U/L   Alkaline Phosphatase 170 (H) 38 - 126 U/L   Total Bilirubin 4.3 (H) 0.3 - 1.2 mg/dL   GFR calc non Af Amer 37 (L) >60 mL/min   GFR calc Af Amer 42 (L) >60 mL/min    Comment: (NOTE) The eGFR has been calculated using the CKD EPI equation. This calculation has not been validated in all clinical situations. eGFR's persistently <60 mL/min signify possible Chronic Kidney Disease.    Anion gap 9 5 - 15  CBC WITH DIFFERENTIAL     Status: Abnormal   Collection Time: 02/15/16  5:13 AM  Result Value Ref Range   WBC 14.0 (H) 4.0 - 10.5 K/uL   RBC 4.18 (L) 4.22 - 5.81 MIL/uL   Hemoglobin 13.4 13.0 - 17.0 g/dL   HCT 39.2 39.0 - 52.0 %   MCV 93.8 78.0 - 100.0 fL   MCH 32.1 26.0 - 34.0 pg   MCHC 34.2 30.0 - 36.0 g/dL   RDW 13.5 11.5 - 15.5 %   Platelets 154 150 - 400 K/uL   Neutrophils Relative % 89 %   Neutro Abs 12.4 (H) 1.7 - 7.7 K/uL   Lymphocytes Relative 7 %   Lymphs Abs 1.0 0.7 - 4.0 K/uL   Monocytes Relative 4 %   Monocytes Absolute 0.5 0.1 - 1.0 K/uL   Eosinophils Relative 0 %   Eosinophils Absolute 0.0 0.0 - 0.7 K/uL   Basophils Relative 0 %   Basophils Absolute 0.0 0.0 - 0.1 K/uL  Protime-INR     Status: Abnormal   Collection Time: 02/15/16  5:13 AM  Result Value Ref Range   Prothrombin Time 15.3 (H) 11.4 - 15.2 seconds   INR 1.20   APTT     Status: Abnormal   Collection Time: 02/15/16  5:13 AM  Result Value Ref Range   aPTT 41 (H) 24 - 36  seconds    Comment:        IF BASELINE aPTT IS ELEVATED, SUGGEST PATIENT RISK ASSESSMENT BE USED TO DETERMINE APPROPRIATE ANTICOAGULANT THERAPY.   Glucose, capillary     Status: Abnormal   Collection Time: 02/15/16  6:35 AM  Result Value Ref Range   Glucose-Capillary 127 (H) 65 - 99 mg/dL   Dg Chest 2 View  Result Date: 02/14/2016 CLINICAL DATA:  Epigastric abdominal pain beginning this morning. Nausea. Former smoker. EXAM: CHEST  2 VIEW COMPARISON:  12/22/2013 FINDINGS: The patient is mildly rotated to the right. Cardiomediastinal silhouette is unchanged within this limitation. Normal heart size. There is mild interstitial coarsening which has mildly increased from the prior study. Mild right basilar opacity is present. A 9 mm nodule projects in the left lung base. No pleural effusion or pneumothorax is seen. No acute osseous abnormality is identified. IMPRESSION: 1. Mild bronchitic changes. 2. Mild right basilar opacity, likely atelectasis. 3. Left lung base nodule.  See CT abdomen and pelvis report from today. Electronically Signed   By: Logan Bores M.D.   On: 02/14/2016 17:14   Ct Abdomen Pelvis W Contrast  Addendum Date: 02/14/2016   ADDENDUM REPORT: 02/14/2016 17:46 ADDENDUM: Bilateral right greater than left fat filled inguinal hernias Electronically Signed   By: Donavan Foil M.D.   On: 02/14/2016 17:46   Result Date: 02/14/2016 CLINICAL DATA:  Upper abdominal pain after eating cereal.  Nausea EXAM: CT ABDOMEN AND PELVIS WITH CONTRAST TECHNIQUE: Multidetector CT imaging of the abdomen and pelvis was performed using the standard protocol following bolus administration of intravenous contrast. CONTRAST:  15m ISOVUE-300 IOPAMIDOL (ISOVUE-300) INJECTION 61% COMPARISON:  None. FINDINGS: Lower chest: There is patchy dependent atelectasis. Mild right middle lobe and lingular atelectasis or scarring. No pleural effusions. There are multiple pulmonary nodules visualized at the lung bases. There  it is a a 3 mm left upper lobe lateral lung nodule, series 205, image number 3. There is a a 4 mm nodule along the left fissure, series 205, image number 7. There is a 8 mm pulmonary nodule within the superior portion of left lower lobe, series 205, image number 1. There is additional 3 mm left subpleural posterior nodule, series 205, image number 9. There is a a 6 mm subpleural right lower lobe pulmonary nodule series 205, image number 5. Mild cardiomegaly. Coronary artery calcifications. Aortic valvular calcifications. Hepatobiliary: Mild intra hepatic biliary dilatation. Multiple calcified granuloma. The gallbladder is slightly enlarged. There are multiple small stones within the gallbladder. There is mild enlargement of the extrahepatic common bile duct, measuring up to 9 mm. There is high density material or possible stones within the distal common bile duct at the head of the pancreas, series 201, image number 40 and coronal series 203, image number 68. Pancreas: Unremarkable. No pancreatic ductal dilatation or surrounding inflammatory changes. Spleen: Multiple calcified splenic granuloma. Spleen otherwise unremarkable. Adrenals/Urinary Tract: Adrenal glands are within normal limits. There are multiple hypodense lesions within the kidneys, many of which are too small to further characterize but favor cysts. The bladder is unremarkable. Stomach/Bowel: No dilated large or small bowel. Stomach grossly unremarkable. Appendix visualized and within normal limits. Vascular/Lymphatic: Ectatic abdominal aorta with mural thrombus and atherosclerosis. There is ectasia of the iliac arteries. No significantly enlarged pelvic or abdominal lymph nodes. Reproductive: Prostate gland is enlarged with calcifications. Other: No free air or free fluid Musculoskeletal: Degenerative changes of the spine. No suspicious osseous lesions. IMPRESSION: 1. Slightly enlarged gallbladder which contains small calcified stones. There is mild  intra and extrahepatic biliary dilatation with small amount of high density material or possible stone within the distal common bile duct at the head of the pancreas. Recommend correlation with laboratory values, if obstruction is suggested, further evaluation with MR or ERCP may be obtained. 2. Multiple calcified granulomas within the liver and spleen. 3. Multiple pulmonary nodules within the bilateral lung bases. No follow-up needed if patient is low-risk (and has no known or suspected primary neoplasm). Non-contrast chest CT can be considered in 12 months if patient is high-risk. This recommendation follows the consensus statement: Guidelines for Management of Incidental Pulmonary Nodules Detected on CT Images: From the Fleischner Society 2017; Radiology 2017; 284:228-243. Electronically Signed: By: KDonavan FoilM.D. On: 02/14/2016 17:10   [place] Ruq  Result Date: 02/14/2016 CLINICAL DATA:  Right upper quadrant pain for 1 day. EXAM: [place] LIMITED - RIGHT UPPER QUADRANT COMPARISON:  CT abdomen and pelvis 02/14/2016  FINDINGS: Gallbladder: The gallbladder wall thickening (or combination of gallbladder wall and trace pericholecystic fluid) measures up to 7 mm in thickness. Small amount of gallbladder sludge. No sonographic Murphy sign noted by sonographer. Common bile duct: Diameter: 8 mm.  Distal portion not well visualized. Liver: Mild intrahepatic biliary dilatation. No focal liver lesion identified, however the left lobe was suboptimally visualized due to bowel gas. IMPRESSION: 1. Small amount of gallbladder sludge with mild gallbladder wall thickening (and/or trace pericholecystic fluid). Acute cholecystitis is not excluded in the appropriate clinical setting, however sonographic Murphy sign was negative. 2. Mild intrahepatic and borderline extrahepatic biliary dilatation. The distal common bile duct was not well seen. MRCP or ERCP could be considered to further evaluate the possible distal  common duct stone or other debris described on CT. Electronically Signed   By: Logan Bores M.D.   On: 02/14/2016 18:06    Assessment: Cholelithiasis with likely choledocholithiasis, possible cholangitis Plan:  I think he will need ERCP independent of the MRCP findings but we'll await results. Scheduling may be problematic. We'll probably proceed tomorrow. Arnell Slivinski C 02/15/2016, 10:45 AM   Addendum: Patient wife and son in room. Greater than 30 minutes spent discussing gallstones, common bile duct stones and ERCP as well as laparoscopic cholecystectomy with the patient. Risks rationale and alternatives to ERCP were explained to the patient and he wished to proceed. We'll schedule for sometime tomorrow.  Pager [phone] If no answer or after 5 PM call [phone]

## 2016-02-15 NOTE — Progress Notes (Signed)
Subjective: He denies abdominal pain this morning  Objective: Vital signs in last 24 hours: Temp:  [97.4 F (36.3 C)-101.3 F (38.5 C)] 99.5 F (37.5 C) (11/08 [id number]) Pulse Rate:  [63-97] 63 (11/08 0633) Resp:  [15-25] 24 (11/07 2000) BP: (111-181)/(56-106) 115/63 (11/08 [id number]) SpO2:  [88 %-99 %] 93 % (11/08 [id number]) Weight:  [65.8 kg (145 lb)] 65.8 kg (145 lb) (11/07 1422) Last BM Date: 02/13/16  Intake/Output from previous day: 11/07 0701 - 11/08 0700 In: 2281.3 [P.O.:30; I.V.:151.3; IV Piggyback:2100] Out: -  Intake/Output this shift: No intake/output data recorded.  Exam: Awake and alert Comfortable in appearance Abdomen soft, non tender, non guarding  Lab Results:   Recent Labs  02/14/16 1425 02/15/16 0513  WBC 14.2* 14.0*  HGB 15.2 13.4  HCT 44.4 39.2  PLT 163 154   BMET  Recent Labs  02/14/16 1425 02/15/16 0513  NA 137 137  K 4.2 4.8  CL 103 106  CO2 22 22  GLUCOSE 179* 131*  BUN 22* 16  CREATININE 1.31* 1.55*  CALCIUM 9.8 9.0   PT/INR  Recent Labs  02/15/16 0513  LABPROT 15.3*  INR 1.20   ABG No results for input(s): PHART, HCO3 in the last 72 hours.  Invalid input(s): PCO2, PO2  Studies/Results: Dg Chest 2 View  Result Date: 02/14/2016 CLINICAL DATA:  Epigastric abdominal pain beginning this morning. Nausea. Former smoker. EXAM: CHEST  2 VIEW COMPARISON:  12/22/2013 FINDINGS: The patient is mildly rotated to the right. Cardiomediastinal silhouette is unchanged within this limitation. Normal heart size. There is mild interstitial coarsening which has mildly increased from the prior study. Mild right basilar opacity is present. A 9 mm nodule projects in the left lung base. No pleural effusion or pneumothorax is seen. No acute osseous abnormality is identified. IMPRESSION: 1. Mild bronchitic changes. 2. Mild right basilar opacity, likely atelectasis. 3. Left lung base nodule. See CT abdomen and pelvis report from today. Electronically Signed    By: Logan Bores M.D.   On: 02/14/2016 17:14   Ct Abdomen Pelvis W Contrast  Addendum Date: 02/14/2016   ADDENDUM REPORT: 02/14/2016 17:46 ADDENDUM: Bilateral right greater than left fat filled inguinal hernias Electronically Signed   By: Donavan Foil M.D.   On: 02/14/2016 17:46   Result Date: 02/14/2016 CLINICAL DATA:  Upper abdominal pain after eating cereal.  Nausea EXAM: CT ABDOMEN AND PELVIS WITH CONTRAST TECHNIQUE: Multidetector CT imaging of the abdomen and pelvis was performed using the standard protocol following bolus administration of intravenous contrast. CONTRAST:  31mL ISOVUE-300 IOPAMIDOL (ISOVUE-300) INJECTION 61% COMPARISON:  None. FINDINGS: Lower chest: There is patchy dependent atelectasis. Mild right middle lobe and lingular atelectasis or scarring. No pleural effusions. There are multiple pulmonary nodules visualized at the lung bases. There it is a a 3 mm left upper lobe lateral lung nodule, series 205, image number 3. There is a a 4 mm nodule along the left fissure, series 205, image number 7. There is a 8 mm pulmonary nodule within the superior portion of left lower lobe, series 205, image number 1. There is additional 3 mm left subpleural posterior nodule, series 205, image number 9. There is a a 6 mm subpleural right lower lobe pulmonary nodule series 205, image number 5. Mild cardiomegaly. Coronary artery calcifications. Aortic valvular calcifications. Hepatobiliary: Mild intra hepatic biliary dilatation. Multiple calcified granuloma. The gallbladder is slightly enlarged. There are multiple small stones within the gallbladder. There is mild enlargement of the extrahepatic common bile duct,  measuring up to 9 mm. There is high density material or possible stones within the distal common bile duct at the head of the pancreas, series 201, image number 40 and coronal series 203, image number 68. Pancreas: Unremarkable. No pancreatic ductal dilatation or surrounding inflammatory changes.  Spleen: Multiple calcified splenic granuloma. Spleen otherwise unremarkable. Adrenals/Urinary Tract: Adrenal glands are within normal limits. There are multiple hypodense lesions within the kidneys, many of which are too small to further characterize but favor cysts. The bladder is unremarkable. Stomach/Bowel: No dilated large or small bowel. Stomach grossly unremarkable. Appendix visualized and within normal limits. Vascular/Lymphatic: Ectatic abdominal aorta with mural thrombus and atherosclerosis. There is ectasia of the iliac arteries. No significantly enlarged pelvic or abdominal lymph nodes. Reproductive: Prostate gland is enlarged with calcifications. Other: No free air or free fluid Musculoskeletal: Degenerative changes of the spine. No suspicious osseous lesions. IMPRESSION: 1. Slightly enlarged gallbladder which contains small calcified stones. There is mild intra and extrahepatic biliary dilatation with small amount of high density material or possible stone within the distal common bile duct at the head of the pancreas. Recommend correlation with laboratory values, if obstruction is suggested, further evaluation with MR or ERCP may be obtained. 2. Multiple calcified granulomas within the liver and spleen. 3. Multiple pulmonary nodules within the bilateral lung bases. No follow-up needed if patient is low-risk (and has no known or suspected primary neoplasm). Non-contrast chest CT can be considered in 12 months if patient is high-risk. This recommendation follows the consensus statement: Guidelines for Management of Incidental Pulmonary Nodules Detected on CT Images: From the Fleischner Society 2017; Radiology 2017; 284:228-243. Electronically Signed: By: Donavan Foil M.D. On: 02/14/2016 17:10   US Abdomen Limited Ruq  Result Date: 02/14/2016 CLINICAL DATA:  Right upper quadrant pain for 1 day. EXAM: US ABDOMEN LIMITED - RIGHT UPPER QUADRANT COMPARISON:  CT abdomen and pelvis 02/14/2016 FINDINGS:  Gallbladder: The gallbladder wall thickening (or combination of gallbladder wall and trace pericholecystic fluid) measures up to 7 mm in thickness. Small amount of gallbladder sludge. No sonographic Murphy sign noted by sonographer. Common bile duct: Diameter: 8 mm.  Distal portion not well visualized. Liver: Mild intrahepatic biliary dilatation. No focal liver lesion identified, however the left lobe was suboptimally visualized due to bowel gas. IMPRESSION: 1. Small amount of gallbladder sludge with mild gallbladder wall thickening (and/or trace pericholecystic fluid). Acute cholecystitis is not excluded in the appropriate clinical setting, however sonographic Murphy sign was negative. 2. Mild intrahepatic and borderline extrahepatic biliary dilatation. The distal common bile duct was not well seen. MRCP or ERCP could be considered to further evaluate the possible distal common duct stone or other debris described on CT. Electronically Signed   By: Logan Bores M.D.   On: 02/14/2016 18:06    Anti-infectives: Anti-infectives    Start     Dose/Rate Route Frequency Ordered Stop   02/14/16 2100  piperacillin-tazobactam (ZOSYN) IVPB 3.375 g     3.375 g 12.5 mL/hr over 240 Minutes Intravenous Every 8 hours 02/14/16 2051        Assessment/Plan: Symptomatic cholelithiasis with possible CBD stone.  LFT"S up with bili now at 4.3.  MRCP pending.  Further plans (lap chole vs ERCP) depending on results.  LOS: 1 day    Eve Rey A 02/15/2016

## 2016-02-15 NOTE — Progress Notes (Signed)
[facility]                                                                              Patient Demographics  Allen Wheeler, is a 82 y.o. male, DOB - [birth year], [id number]  Admit date - 02/14/2016   Admitting Physician Reubin Milan, MD  Outpatient Primary MD for the patient is [name], MD  Outpatient specialists:   LOS - 1  days    Chief Complaint  Patient presents with  . Abdominal Pain    pt after eating some cereal having lower mid abdominal pain        Brief summary   HPI: Allen Wheeler is a 82 y.o. male with medical history significant of seasonal allergies, anxiety, depression, GERD, nephrolithiasis, osteoarthritis Who presented with one-day history of right upper quadrant abdominal pain. Patient also reported persistent nausea and dry heaving after eating breakfast on the morning of admission. He had a similar attack 2 months ago with negative workup. Workup showed WBC of 14.2, hepatic panel function with AST of 356, ALT of 156, alkaline phosphatase 152 units. His total bilirubin of 2.3 mg/dL. CT abdomen and pelvis showed gallstones with mild intra-and extrahepatic biliary dilatation with possible stone in distal CBD at the head of pancreas. Patient was admitted to further workup   Assessment & Plan    Principal Problem:  Cholelithiasis with acute cholangitis with biliary obstruction, Sepsis ([place]) - LFTs trending up with bilirubin trending up to 4.3, continue IV Zosyn, IV fluids, pain control -MRCP pending - GI consulted, I called [facility] - Gen. surgery following, will follow recommendations regarding lap cholecystectomy versus ERCP   Active Problems:   Anxiety, depression - Hold scheduled lorazepam 1 mg orally while on fentanyl. - Continue Celexa 20 mg by mouth daily.    GERD Famotidine 20 mg IVP while nothing by mouth.    Hypothyroidism Change to IV Synthroid 25 MCG daily  Code Status: Full CODE  STATUS  DVT Prophylaxis:  heparin  Family Communication: Discussed in detail with the patient, all imaging results, lab results explained to the patient   Disposition Plan:   Time Spent in minutes  25 minutes  Procedures:  CT abd  Consultants:   GI surgery  Antimicrobials:   zosyn   Medications  Scheduled Meds: . citalopram  20 mg Oral Daily  . famotidine (PEPCID) IV  20 mg Intravenous Q12H  . gabapentin  100 mg Oral QHS  . heparin  5,000 Units Subcutaneous Q8H  . levothyroxine  50 mcg Oral QAC breakfast  . piperacillin-tazobactam (ZOSYN)  IV  3.375 g Intravenous Q8H  . sodium chloride flush  3 mL Intravenous Q12H   Continuous Infusions: . sodium chloride 75 mL/hr at 02/15/16 0514   PRN Meds:.fentaNYL (SUBLIMAZE) injection, LORazepam, ondansetron **OR** ondansetron (ZOFRAN) IV   Antibiotics   Anti-infectives    Start     Dose/Rate Route Frequency Ordered Stop   02/14/16 2100  piperacillin-tazobactam (ZOSYN) IVPB 3.375 g     3.375 g 12.5 mL/hr over 240 Minutes Intravenous Every 8 hours 02/14/16 2051          Subjective:  Allen Wheeler was seen and examined today.  Overnight spiking temp, Tmax 101.45F, abdominal pain is improving. No nausea or vomiting this morning. Patient denies dizziness, chest pain, shortness of breath, new weakness, numbess, tingling. No acute events overnight.    Objective:   Vitals:   02/14/16 2000 02/14/16 2031 02/15/16 0036 02/15/16 0633  BP: 120/70 (!) 111/58  115/63  Pulse: 95 87  63  Resp: 24     Temp:  (!) 101.3 F (38.5 C) 100.3 F (37.9 C) 99.5 F (37.5 C)  TempSrc:  Oral Oral Oral  SpO2: 94% 96%  93%  Weight:      Height:        Intake/Output Summary (Last 24 hours) at 02/15/16 1022 Last data filed at 02/15/16 0533  Gross per 24 hour  Intake          2281.25 ml  Output                0 ml  Net          2281.25 ml     Wt Readings from Last 3 Encounters:  02/14/16 65.8 kg (145 lb)  12/13/15 70.8 kg (156  lb)  07/25/15 75.3 kg (166 lb)     Exam  General: Alert and oriented x 3, NAD  HEENT:  PERRLA, EOMI, Anicteric Sclera, mucous membranes moist.   Neck: Supple, no JVD, no masses  Cardiovascular: S1 S2 auscultated, no rubs, murmurs or gallops. Regular rate and rhythm.  Respiratory: Clear to auscultation bilaterally, no wheezing, rales or rhonchi  Gastrointestinal: Soft, nontender, nondistended, + bowel sounds  Ext: no cyanosis clubbing or edema  Neuro: AAOx3, Cr N's II- XII. Strength 5/5 upper and lower extremities bilaterally  Skin: No rashes  Psych: Normal affect and demeanor, alert and oriented x3    Data Reviewed:  I have personally reviewed following labs and imaging studies  Micro Results No results found for this or any previous visit (from the past 240 hour(s)).  Radiology Reports Dg Chest 2 View  Result Date: 02/14/2016 CLINICAL DATA:  Epigastric abdominal pain beginning this morning. Nausea. Former smoker. EXAM: CHEST  2 VIEW COMPARISON:  12/22/2013 FINDINGS: The patient is mildly rotated to the right. Cardiomediastinal silhouette is unchanged within this limitation. Normal heart size. There is mild interstitial coarsening which has mildly increased from the prior study. Mild right basilar opacity is present. A 9 mm nodule projects in the left lung base. No pleural effusion or pneumothorax is seen. No acute osseous abnormality is identified. IMPRESSION: 1. Mild bronchitic changes. 2. Mild right basilar opacity, likely atelectasis. 3. Left lung base nodule. See CT abdomen and pelvis report from today. Electronically Signed   By: Logan Bores M.D.   On: 02/14/2016 17:14   Ct Abdomen Pelvis W Contrast  Addendum Date: 02/14/2016   ADDENDUM REPORT: 02/14/2016 17:46 ADDENDUM: Bilateral right greater than left fat filled inguinal hernias Electronically Signed   By: Donavan Foil M.D.   On: 02/14/2016 17:46   Result Date: 02/14/2016 CLINICAL DATA:  Upper abdominal pain after  eating cereal.  Nausea EXAM: CT ABDOMEN AND PELVIS WITH CONTRAST TECHNIQUE: Multidetector CT imaging of the abdomen and pelvis was performed using the standard protocol following bolus administration of intravenous contrast. CONTRAST:  86mL ISOVUE-300 IOPAMIDOL (ISOVUE-300) INJECTION 61% COMPARISON:  None. FINDINGS: Lower chest: There is patchy dependent atelectasis. Mild right middle lobe and lingular atelectasis or scarring. No pleural effusions. There are multiple pulmonary nodules visualized at the lung bases.  There it is a a 3 mm left upper lobe lateral lung nodule, series 205, image number 3. There is a a 4 mm nodule along the left fissure, series 205, image number 7. There is a 8 mm pulmonary nodule within the superior portion of left lower lobe, series 205, image number 1. There is additional 3 mm left subpleural posterior nodule, series 205, image number 9. There is a a 6 mm subpleural right lower lobe pulmonary nodule series 205, image number 5. Mild cardiomegaly. Coronary artery calcifications. Aortic valvular calcifications. Hepatobiliary: Mild intra hepatic biliary dilatation. Multiple calcified granuloma. The gallbladder is slightly enlarged. There are multiple small stones within the gallbladder. There is mild enlargement of the extrahepatic common bile duct, measuring up to 9 mm. There is high density material or possible stones within the distal common bile duct at the head of the pancreas, series 201, image number 40 and coronal series 203, image number 68. Pancreas: Unremarkable. No pancreatic ductal dilatation or surrounding inflammatory changes. Spleen: Multiple calcified splenic granuloma. Spleen otherwise unremarkable. Adrenals/Urinary Tract: Adrenal glands are within normal limits. There are multiple hypodense lesions within the kidneys, many of which are too small to further characterize but favor cysts. The bladder is unremarkable. Stomach/Bowel: No dilated large or small bowel. Stomach  grossly unremarkable. Appendix visualized and within normal limits. Vascular/Lymphatic: Ectatic abdominal aorta with mural thrombus and atherosclerosis. There is ectasia of the iliac arteries. No significantly enlarged pelvic or abdominal lymph nodes. Reproductive: Prostate gland is enlarged with calcifications. Other: No free air or free fluid Musculoskeletal: Degenerative changes of the spine. No suspicious osseous lesions. IMPRESSION: 1. Slightly enlarged gallbladder which contains small calcified stones. There is mild intra and extrahepatic biliary dilatation with small amount of high density material or possible stone within the distal common bile duct at the head of the pancreas. Recommend correlation with laboratory values, if obstruction is suggested, further evaluation with MR or ERCP may be obtained. 2. Multiple calcified granulomas within the liver and spleen. 3. Multiple pulmonary nodules within the bilateral lung bases. No follow-up needed if patient is low-risk (and has no known or suspected primary neoplasm). Non-contrast chest CT can be considered in 12 months if patient is high-risk. This recommendation follows the consensus statement: Guidelines for Management of Incidental Pulmonary Nodules Detected on CT Images: From the Fleischner Society 2017; Radiology 2017; 284:228-243. Electronically Signed: By: Donavan Foil M.D. On: 02/14/2016 17:10   US Abdomen Limited Ruq  Result Date: 02/14/2016 CLINICAL DATA:  Right upper quadrant pain for 1 day. EXAM: US ABDOMEN LIMITED - RIGHT UPPER QUADRANT COMPARISON:  CT abdomen and pelvis 02/14/2016 FINDINGS: Gallbladder: The gallbladder wall thickening (or combination of gallbladder wall and trace pericholecystic fluid) measures up to 7 mm in thickness. Small amount of gallbladder sludge. No sonographic Murphy sign noted by sonographer. Common bile duct: Diameter: 8 mm.  Distal portion not well visualized. Liver: Mild intrahepatic biliary dilatation. No  focal liver lesion identified, however the left lobe was suboptimally visualized due to bowel gas. IMPRESSION: 1. Small amount of gallbladder sludge with mild gallbladder wall thickening (and/or trace pericholecystic fluid). Acute cholecystitis is not excluded in the appropriate clinical setting, however sonographic Murphy sign was negative. 2. Mild intrahepatic and borderline extrahepatic biliary dilatation. The distal common bile duct was not well seen. MRCP or ERCP could be considered to further evaluate the possible distal common duct stone or other debris described on CT. Electronically Signed   By: Seymour Bars.D.  On: 02/14/2016 18:06    Lab Data:  CBC:  Recent Labs Lab 02/14/16 1425 02/15/16 0513  WBC 14.2* 14.0*  NEUTROABS  --  12.4*  HGB 15.2 13.4  HCT 44.4 39.2  MCV 93.9 93.8  PLT 163 [id number]   Basic Metabolic Panel:  Recent Labs Lab 02/14/16 1425 02/15/16 0513  NA 137 137  K 4.2 4.8  CL 103 106  CO2 22 22  GLUCOSE 179* 131*  BUN 22* 16  CREATININE 1.31* 1.55*  CALCIUM 9.8 9.0   GFR: Estimated Creatinine Clearance: 26.3 mL/min (by C-G formula based on SCr of 1.55 mg/dL (H)). Liver Function Tests:  Recent Labs Lab 02/14/16 1425 02/15/16 0513  AST 356* 1,149*  ALT 156* 823*  ALKPHOS 152* 170*  BILITOT 2.3* 4.3*  PROT 8.0 6.6  ALBUMIN 3.9 3.0*    Recent Labs Lab 02/14/16 1425  LIPASE 51   No results for input(s): AMMONIA in the last 168 hours. Coagulation Profile:  Recent Labs Lab 02/15/16 0513  INR 1.20   Cardiac Enzymes: No results for input(s): CKTOTAL, CKMB, CKMBINDEX, TROPONINI in the last 168 hours. BNP (last 3 results) No results for input(s): PROBNP in the last 8760 hours. HbA1C: No results for input(s): HGBA1C in the last 72 hours. CBG:  Recent Labs Lab 02/15/16 0013 02/15/16 0635  GLUCAP 179* 127*   Lipid Profile: No results for input(s): CHOL, HDL, LDLCALC, TRIG, CHOLHDL, LDLDIRECT in the last 72 hours. Thyroid Function  Tests: No results for input(s): TSH, T4TOTAL, FREET4, T3FREE, THYROIDAB in the last 72 hours. Anemia Panel: No results for input(s): VITAMINB12, FOLATE, FERRITIN, TIBC, IRON, RETICCTPCT in the last 72 hours. Urine analysis:    Component Value Date/Time   COLORURINE YELLOW 02/14/2016 1506   APPEARANCEUR CLEAR 02/14/2016 1506   LABSPEC 1.018 02/14/2016 1506   PHURINE 6.5 02/14/2016 1506   GLUCOSEU NEGATIVE 02/14/2016 1506   HGBUR NEGATIVE 02/14/2016 1506   BILIRUBINUR NEGATIVE 02/14/2016 1506   BILIRUBINUR n 07/25/2015 1236   KETONESUR 40 (A) 02/14/2016 1506   PROTEINUR NEGATIVE 02/14/2016 1506   UROBILINOGEN 0.2 07/25/2015 1236   UROBILINOGEN 1.0 12/22/2013 0341   NITRITE NEGATIVE 02/14/2016 1506   LEUKOCYTESUR NEGATIVE 02/14/2016 1506     RAI,RIPUDEEP M.D. [facility] 02/15/2016, 10:22 AM  Pager: [phone] Between 7am to 7pm - call Pager - [phone]  After 7pm go to [email or web address] - password [email or web address]  Call night coverage person covering after 7pm

## 2016-02-16 ENCOUNTER — Encounter (HOSPITAL_COMMUNITY)
Admission: EM | Disposition: A | Discharge status: Home Health | Payer: Self-pay | Source: Home / Self Care | Attending: Internal Medicine

## 2016-02-16 ENCOUNTER — Inpatient Hospital Stay (HOSPITAL_COMMUNITY): Payer: Medicare Other | Admitting: Anesthesiology

## 2016-02-16 ENCOUNTER — Inpatient Hospital Stay (HOSPITAL_COMMUNITY): Payer: Medicare Other

## 2016-02-16 ENCOUNTER — Encounter (HOSPITAL_COMMUNITY): Payer: Self-pay | Admitting: *Deleted

## 2016-02-16 HISTORY — PX: ERCP: SHX5425

## 2016-02-16 LAB — COMPREHENSIVE METABOLIC PANEL
ALT: 555 U/L — AB (ref 17–63)
ANION GAP: 12 (ref 5–15)
AST: 427 U/L — ABNORMAL HIGH (ref 15–41)
Albumin: 3.2 g/dL — ABNORMAL LOW (ref 3.5–5.0)
Alkaline Phosphatase: 162 U/L — ABNORMAL HIGH (ref 38–126)
BUN: 16 mg/dL (ref 6–20)
CALCIUM: 8.7 mg/dL — AB (ref 8.9–10.3)
CHLORIDE: 104 mmol/L (ref 101–111)
CO2: 21 mmol/L — AB (ref 22–32)
CREATININE: 1.54 mg/dL — AB (ref 0.61–1.24)
GFR, EST AFRICAN AMERICAN: 43 mL/min — AB (ref >60)
GFR, EST NON AFRICAN AMERICAN: 37 mL/min — AB (ref >60)
Glucose, Bld: 118 mg/dL — ABNORMAL HIGH (ref 65–99)
Potassium: 3.6 mmol/L (ref 3.5–5.1)
SODIUM: 137 mmol/L (ref 135–145)
Total Bilirubin: 4.7 mg/dL — ABNORMAL HIGH (ref 0.3–1.2)
Total Protein: 6.8 g/dL (ref 6.5–8.1)

## 2016-02-16 LAB — GLUCOSE, CAPILLARY
GLUCOSE-CAPILLARY: 100 mg/dL — AB (ref 65–99)
GLUCOSE-CAPILLARY: 124 mg/dL — AB (ref 65–99)
Glucose-Capillary: 100 mg/dL — ABNORMAL HIGH (ref 65–99)
Glucose-Capillary: 106 mg/dL — ABNORMAL HIGH (ref 65–99)

## 2016-02-16 LAB — CBC WITH DIFFERENTIAL/PLATELET
BASOS PCT: 0 %
Basophils Absolute: 0 10*3/uL (ref 0.0–0.1)
Eosinophils Absolute: 0.1 10*3/uL (ref 0.0–0.7)
Eosinophils Relative: 1 %
HEMATOCRIT: 39.1 % (ref 39.0–52.0)
HEMOGLOBIN: 13.6 g/dL (ref 13.0–17.0)
Lymphocytes Relative: 9 %
Lymphs Abs: 1.1 10*3/uL (ref 0.7–4.0)
MCH: 32.2 pg (ref 26.0–34.0)
MCHC: 34.8 g/dL (ref 30.0–36.0)
MCV: 92.7 fL (ref 78.0–100.0)
Monocytes Absolute: 0.9 10*3/uL (ref 0.1–1.0)
Monocytes Relative: 7 %
NEUTROS ABS: 10.1 10*3/uL — AB (ref 1.7–7.7)
NEUTROS PCT: 83 %
Platelets: 131 10*3/uL — ABNORMAL LOW (ref 150–400)
RBC: 4.22 MIL/uL (ref 4.22–5.81)
RDW: 13.6 % (ref 11.5–15.5)
WBC: 12.1 10*3/uL — AB (ref 4.0–10.5)

## 2016-02-16 LAB — SURGICAL PCR SCREEN
MRSA, PCR: NEGATIVE
Staphylococcus aureus: NEGATIVE

## 2016-02-16 SURGERY — ERCP, WITH INTERVENTION IF INDICATED
Anesthesia: General

## 2016-02-16 MED ORDER — GLUCAGON HCL RDNA (DIAGNOSTIC) 1 MG IJ SOLR
INTRAMUSCULAR | Status: DC | PRN
  Administered 2016-02-16: 1 mg via INTRAVENOUS

## 2016-02-16 MED ORDER — FENTANYL CITRATE (PF) 100 MCG/2ML IJ SOLN
INTRAMUSCULAR | Status: DC | PRN
  Administered 2016-02-16: 50 ug via INTRAVENOUS

## 2016-02-16 MED ORDER — LIDOCAINE 2% (20 MG/ML) 5 ML SYRINGE
INTRAMUSCULAR | Status: DC | PRN
  Administered 2016-02-16: 50 mg via INTRAVENOUS

## 2016-02-16 MED ORDER — EPHEDRINE SULFATE-NACL 50-0.9 MG/10ML-% IV SOSY
PREFILLED_SYRINGE | INTRAVENOUS | Status: DC | PRN
  Administered 2016-02-16: 15 mg via INTRAVENOUS
  Administered 2016-02-16 (×2): 5 mg via INTRAVENOUS

## 2016-02-16 MED ORDER — CHLORHEXIDINE GLUCONATE CLOTH 2 % EX PADS
6.0000 | MEDICATED_PAD | Freq: Once | CUTANEOUS | Status: AC
  Administered 2016-02-17: 6 via TOPICAL

## 2016-02-16 MED ORDER — PROPOFOL 10 MG/ML IV BOLUS
INTRAVENOUS | Status: DC | PRN
  Administered 2016-02-16: 120 mg via INTRAVENOUS

## 2016-02-16 MED ORDER — ROCURONIUM BROMIDE 10 MG/ML (PF) SYRINGE
PREFILLED_SYRINGE | INTRAVENOUS | Status: DC | PRN
  Administered 2016-02-16: 40 mg via INTRAVENOUS

## 2016-02-16 MED ORDER — IOPAMIDOL (ISOVUE-300) INJECTION 61%
INTRAVENOUS | Status: AC
  Filled 2016-02-16: qty 50

## 2016-02-16 MED ORDER — PHENYLEPHRINE HCL 10 MG/ML IJ SOLN
INTRAVENOUS | Status: DC | PRN
  Administered 2016-02-16: 50 ug/min via INTRAVENOUS

## 2016-02-16 MED ORDER — INDOMETHACIN 50 MG RE SUPP
RECTAL | Status: AC
  Filled 2016-02-16: qty 1

## 2016-02-16 MED ORDER — HYDROMORPHONE HCL 1 MG/ML IJ SOLN
1.0000 mg | INTRAMUSCULAR | Status: DC | PRN
  Administered 2016-02-16 – 2016-02-19 (×6): 1 mg via INTRAVENOUS
  Filled 2016-02-16 (×6): qty 1

## 2016-02-16 MED ORDER — ONDANSETRON HCL 4 MG/2ML IJ SOLN
INTRAMUSCULAR | Status: DC | PRN
  Administered 2016-02-16: 4 mg via INTRAVENOUS

## 2016-02-16 MED ORDER — PROCHLORPERAZINE EDISYLATE 5 MG/ML IJ SOLN
5.0000 mg | Freq: Once | INTRAMUSCULAR | Status: AC
  Administered 2016-02-16: 5 mg via INTRAVENOUS
  Filled 2016-02-16: qty 1

## 2016-02-16 MED ORDER — SUGAMMADEX SODIUM 200 MG/2ML IV SOLN
INTRAVENOUS | Status: DC | PRN
  Administered 2016-02-16: 150 mg via INTRAVENOUS

## 2016-02-16 MED ORDER — FENTANYL CITRATE (PF) 100 MCG/2ML IJ SOLN
25.0000 ug | Freq: Once | INTRAMUSCULAR | Status: AC
  Administered 2016-02-16: 25 ug via INTRAVENOUS

## 2016-02-16 MED ORDER — SODIUM CHLORIDE 0.9 % IV SOLN: INTRAVENOUS | Status: DC

## 2016-02-16 MED ORDER — LACTATED RINGERS IV SOLN
INTRAVENOUS | Status: DC
  Administered 2016-02-16: 1000 mL via INTRAVENOUS

## 2016-02-16 MED ORDER — SODIUM CHLORIDE 0.9 % IV SOLN
INTRAVENOUS | Status: DC | PRN
  Administered 2016-02-16: 80 mL

## 2016-02-16 MED ORDER — GLUCAGON HCL RDNA (DIAGNOSTIC) 1 MG IJ SOLR
INTRAMUSCULAR | Status: AC
  Filled 2016-02-16: qty 1

## 2016-02-16 MED ORDER — FENTANYL CITRATE (PF) 100 MCG/2ML IJ SOLN: 25.0000 ug | INTRAMUSCULAR | Status: DC | PRN

## 2016-02-16 MED ORDER — CEFAZOLIN SODIUM-DEXTROSE 2-4 GM/100ML-% IV SOLN
2.0000 g | INTRAVENOUS | Status: DC
  Filled 2016-02-16: qty 100

## 2016-02-16 NOTE — Progress Notes (Signed)
Day of Surgery  Subjective: Going for ERCP now. He had a lot of discomfort last evening with recurrent pain from his choledocholithiasis.  Family asking about doing both the ERCP and cholecystectomy with the same anesthesia.  Objective: Vital signs in last 24 hours: Temp:  [98 F (36.7 C)-99.3 F (37.4 C)] 98.2 F (36.8 C) (11/09 0527) Pulse Rate:  [60-72] 72 (11/09 0527) Resp:  [17-19] 19 (11/09 0527) BP: (114-150)/(66-89) 150/89 (11/09 0527) SpO2:  [95 %-98 %] 98 % (11/09 0527) Last BM Date: 02/15/16 NPO 1900 IV Urine 100 recorded Afebrile yesterday, MAXIMUM TEMPERATURE 99.3 Vital signs are stable. AST, ALT improved Total bilirubin 4.7 WBC 12.1, H/H stable. MRCP: study was positive for choledocholithiasis.   Intake/Output from previous day: 11/08 0701 - 11/09 0700 In: 1973.8 [I.V.:1823.8; IV Piggyback:150] Out: 100 [Urine:100] Intake/Output this shift: Total I/O In: 95 [I.V.:95] Out: -   General appearance: alert, cooperative and no distress GI: Sore, no distention, positive bowel sounds.  Lab Results:   Recent Labs  02/15/16 0513 02/16/16 0348  WBC 14.0* 12.1*  HGB 13.4 13.6  HCT 39.2 39.1  PLT 154 131*    BMET  Recent Labs  02/15/16 0513 02/16/16 0348  NA 137 137  K 4.8 3.6  CL 106 104  CO2 22 21*  GLUCOSE 131* 118*  BUN 16 16  CREATININE 1.55* 1.54*  CALCIUM 9.0 8.7*   PT/INR  Recent Labs  02/15/16 0513  LABPROT 15.3*  INR 1.20     Recent Labs Lab 02/14/16 1425 02/15/16 0513 02/16/16 0348  AST 356* 1,149* 427*  ALT 156* 823* 555*  ALKPHOS 152* 170* 162*  BILITOT 2.3* 4.3* 4.7*  PROT 8.0 6.6 6.8  ALBUMIN 3.9 3.0* 3.2*     Lipase     Component Value Date/Time   LIPASE 51 02/14/2016 1425     Studies/Results: Dg Chest 2 View  Result Date: 02/14/2016 CLINICAL DATA:  Epigastric abdominal pain beginning this morning. Nausea. Former smoker. EXAM: CHEST  2 VIEW COMPARISON:  12/22/2013 FINDINGS: The patient is mildly rotated  to the right. Cardiomediastinal silhouette is unchanged within this limitation. Normal heart size. There is mild interstitial coarsening which has mildly increased from the prior study. Mild right basilar opacity is present. A 9 mm nodule projects in the left lung base. No pleural effusion or pneumothorax is seen. No acute osseous abnormality is identified. IMPRESSION: 1. Mild bronchitic changes. 2. Mild right basilar opacity, likely atelectasis. 3. Left lung base nodule. See CT abdomen and pelvis report from today. Electronically Signed   By: Logan Bores M.D.   On: 02/14/2016 17:14   Ct Abdomen Pelvis W Contrast  Addendum Date: 02/14/2016   ADDENDUM REPORT: 02/14/2016 17:46 ADDENDUM: Bilateral right greater than left fat filled inguinal hernias Electronically Signed   By: Donavan Foil M.D.   On: 02/14/2016 17:46   Result Date: 02/14/2016 CLINICAL DATA:  Upper abdominal pain after eating cereal.  Nausea EXAM: CT ABDOMEN AND PELVIS WITH CONTRAST TECHNIQUE: Multidetector CT imaging of the abdomen and pelvis was performed using the standard protocol following bolus administration of intravenous contrast. CONTRAST:  28mL ISOVUE-300 IOPAMIDOL (ISOVUE-300) INJECTION 61% COMPARISON:  None. FINDINGS: Lower chest: There is patchy dependent atelectasis. Mild right middle lobe and lingular atelectasis or scarring. No pleural effusions. There are multiple pulmonary nodules visualized at the lung bases. There it is a a 3 mm left upper lobe lateral lung nodule, series 205, image number 3. There is a a 4 mm nodule  along the left fissure, series 205, image number 7. There is a 8 mm pulmonary nodule within the superior portion of left lower lobe, series 205, image number 1. There is additional 3 mm left subpleural posterior nodule, series 205, image number 9. There is a a 6 mm subpleural right lower lobe pulmonary nodule series 205, image number 5. Mild cardiomegaly. Coronary artery calcifications. Aortic valvular  calcifications. Hepatobiliary: Mild intra hepatic biliary dilatation. Multiple calcified granuloma. The gallbladder is slightly enlarged. There are multiple small stones within the gallbladder. There is mild enlargement of the extrahepatic common bile duct, measuring up to 9 mm. There is high density material or possible stones within the distal common bile duct at the head of the pancreas, series 201, image number 40 and coronal series 203, image number 68. Pancreas: Unremarkable. No pancreatic ductal dilatation or surrounding inflammatory changes. Spleen: Multiple calcified splenic granuloma. Spleen otherwise unremarkable. Adrenals/Urinary Tract: Adrenal glands are within normal limits. There are multiple hypodense lesions within the kidneys, many of which are too small to further characterize but favor cysts. The bladder is unremarkable. Stomach/Bowel: No dilated large or small bowel. Stomach grossly unremarkable. Appendix visualized and within normal limits. Vascular/Lymphatic: Ectatic abdominal aorta with mural thrombus and atherosclerosis. There is ectasia of the iliac arteries. No significantly enlarged pelvic or abdominal lymph nodes. Reproductive: Prostate gland is enlarged with calcifications. Other: No free air or free fluid Musculoskeletal: Degenerative changes of the spine. No suspicious osseous lesions. IMPRESSION: 1. Slightly enlarged gallbladder which contains small calcified stones. There is mild intra and extrahepatic biliary dilatation with small amount of high density material or possible stone within the distal common bile duct at the head of the pancreas. Recommend correlation with laboratory values, if obstruction is suggested, further evaluation with MR or ERCP may be obtained. 2. Multiple calcified granulomas within the liver and spleen. 3. Multiple pulmonary nodules within the bilateral lung bases. No follow-up needed if patient is low-risk (and has no known or suspected primary neoplasm).  Non-contrast chest CT can be considered in 12 months if patient is high-risk. This recommendation follows the consensus statement: Guidelines for Management of Incidental Pulmonary Nodules Detected on CT Images: From the Fleischner Society 2017; Radiology 2017; 284:228-243. Electronically Signed: By: Donavan Foil M.D. On: 02/14/2016 17:10   Mr 3d Recon At Scanner  Result Date: 02/15/2016 CLINICAL DATA:  82 year old male with history of abnormal liver function tests. Possible choledocholithiasis noted on prior CT of the abdomen and pelvis. Followup study. EXAM: MRI ABDOMEN WITHOUT AND WITH CONTRAST (INCLUDING MRCP) TECHNIQUE: Multiplanar multisequence MR imaging of the abdomen was performed both before and after the administration of intravenous contrast. Heavily T2-weighted images of the biliary and pancreatic ducts were obtained, and three-dimensional MRCP images were rendered by post processing. CONTRAST:  50mL MULTIHANCE GADOBENATE DIMEGLUMINE 529 MG/ML IV SOLN COMPARISON:  No prior abdominal MRI. CT the abdomen and pelvis 02/14/2016. FINDINGS: Lower chest: Unremarkable. Hepatobiliary: No discrete cystic or solid hepatic lesions. MRCP images demonstrate no intrahepatic biliary ductal dilatation. Common bile duct measures up to 9 mm in the porta hepatis, which is mildly enlarged, but may be within normal limits for the patient's age. However, MRCP images also demonstrate 2 filling defects within the distal common bile duct, largest of which measures 9 mm, compatible with choledocholithiasis. Gallbladder is moderately distended. Within the dependent portion of the gallbladder there is some amorphous material which is T1 isointense and less T2 hyperintense than normal bile, compatible with biliary sludge. Small signal  voids within this also represent gallstones. Gallbladder wall does not appear thickened, however, there is a trace amount of pericholecystic fluid. Pancreas: In the anterior aspect of the  pancreatic head there is a 6 mm T1 hypointense, T2 hyperintense, nonenhancing lesion, likely to represent a tiny pancreatic pseudocyst. No other larger pancreatic mass. No pancreatic ductal dilatation. No pancreatic or peripancreatic fluid or inflammatory changes. Spleen:  Unremarkable. Adrenals/Urinary Tract: Multiple T1 hypointense, T2 hyperintense, nonenhancing lesions associated with both kidneys, compatible with a combination of simple cysts and parapelvic cysts. No hydroureteronephrosis in the visualized portions of the abdomen. Bilateral adrenal glands are normal in appearance. Stomach/Bowel: Several colonic diverticulae are noted in the region of the descending colon. Otherwise, unremarkable. Vascular/Lymphatic: Extensive atherosclerosis in the abdominal vasculature, without aneurysm. No lymphadenopathy noted in the abdomen. Other: No significant volume of ascites. Bilateral perinephric stranding (nonspecific). Musculoskeletal: No aggressive osseous lesions are noted in the visualized portions of the skeleton. IMPRESSION: 1. Study is positive for choledocholithiasis. Common bile duct is mildly dilated measuring 9 mm, however, this may be within normal limits given the patient's advanced age. No intrahepatic biliary ductal dilatation. There is also some biliary sludge and small gallstones lying dependently in the gallbladder. Gallbladder wall does not appear thickened, however, there is a small volume of pericholecystic fluid. Clinical correlation for signs and symptoms of acute cholecystitis is suggested as these findings suspicious but not definitive. 2. Aortic atherosclerosis. 3. Colonic diverticulosis. 4. Additional incidental findings, as above. Electronically Signed   By: Vinnie Langton M.D.   On: 02/15/2016 12:58   Mr Abdomen Mrcp Moise Boring Contast  Result Date: 02/15/2016 CLINICAL DATA:  82 year old male with history of abnormal liver function tests. Possible choledocholithiasis noted on prior CT of  the abdomen and pelvis. Followup study. EXAM: MRI ABDOMEN WITHOUT AND WITH CONTRAST (INCLUDING MRCP) TECHNIQUE: Multiplanar multisequence MR imaging of the abdomen was performed both before and after the administration of intravenous contrast. Heavily T2-weighted images of the biliary and pancreatic ducts were obtained, and three-dimensional MRCP images were rendered by post processing. CONTRAST:  60mL MULTIHANCE GADOBENATE DIMEGLUMINE 529 MG/ML IV SOLN COMPARISON:  No prior abdominal MRI. CT the abdomen and pelvis 02/14/2016. FINDINGS: Lower chest: Unremarkable. Hepatobiliary: No discrete cystic or solid hepatic lesions. MRCP images demonstrate no intrahepatic biliary ductal dilatation. Common bile duct measures up to 9 mm in the porta hepatis, which is mildly enlarged, but may be within normal limits for the patient's age. However, MRCP images also demonstrate 2 filling defects within the distal common bile duct, largest of which measures 9 mm, compatible with choledocholithiasis. Gallbladder is moderately distended. Within the dependent portion of the gallbladder there is some amorphous material which is T1 isointense and less T2 hyperintense than normal bile, compatible with biliary sludge. Small signal voids within this also represent gallstones. Gallbladder wall does not appear thickened, however, there is a trace amount of pericholecystic fluid. Pancreas: In the anterior aspect of the pancreatic head there is a 6 mm T1 hypointense, T2 hyperintense, nonenhancing lesion, likely to represent a tiny pancreatic pseudocyst. No other larger pancreatic mass. No pancreatic ductal dilatation. No pancreatic or peripancreatic fluid or inflammatory changes. Spleen:  Unremarkable. Adrenals/Urinary Tract: Multiple T1 hypointense, T2 hyperintense, nonenhancing lesions associated with both kidneys, compatible with a combination of simple cysts and parapelvic cysts. No hydroureteronephrosis in the visualized portions of the  abdomen. Bilateral adrenal glands are normal in appearance. Stomach/Bowel: Several colonic diverticulae are noted in the region of the descending colon. Otherwise,  unremarkable. Vascular/Lymphatic: Extensive atherosclerosis in the abdominal vasculature, without aneurysm. No lymphadenopathy noted in the abdomen. Other: No significant volume of ascites. Bilateral perinephric stranding (nonspecific). Musculoskeletal: No aggressive osseous lesions are noted in the visualized portions of the skeleton. IMPRESSION: 1. Study is positive for choledocholithiasis. Common bile duct is mildly dilated measuring 9 mm, however, this may be within normal limits given the patient's advanced age. No intrahepatic biliary ductal dilatation. There is also some biliary sludge and small gallstones lying dependently in the gallbladder. Gallbladder wall does not appear thickened, however, there is a small volume of pericholecystic fluid. Clinical correlation for signs and symptoms of acute cholecystitis is suggested as these findings suspicious but not definitive. 2. Aortic atherosclerosis. 3. Colonic diverticulosis. 4. Additional incidental findings, as above. Electronically Signed   By: Vinnie Langton M.D.   On: 02/15/2016 12:58   US Abdomen Limited Ruq  Result Date: 02/14/2016 CLINICAL DATA:  Right upper quadrant pain for 1 day. EXAM: US ABDOMEN LIMITED - RIGHT UPPER QUADRANT COMPARISON:  CT abdomen and pelvis 02/14/2016 FINDINGS: Gallbladder: The gallbladder wall thickening (or combination of gallbladder wall and trace pericholecystic fluid) measures up to 7 mm in thickness. Small amount of gallbladder sludge. No sonographic Murphy sign noted by sonographer. Common bile duct: Diameter: 8 mm.  Distal portion not well visualized. Liver: Mild intrahepatic biliary dilatation. No focal liver lesion identified, however the left lobe was suboptimally visualized due to bowel gas. IMPRESSION: 1. Small amount of gallbladder sludge with mild  gallbladder wall thickening (and/or trace pericholecystic fluid). Acute cholecystitis is not excluded in the appropriate clinical setting, however sonographic Murphy sign was negative. 2. Mild intrahepatic and borderline extrahepatic biliary dilatation. The distal common bile duct was not well seen. MRCP or ERCP could be considered to further evaluate the possible distal common duct stone or other debris described on CT. Electronically Signed   By: Logan Bores M.D.   On: 02/14/2016 18:06   Prior to Admission medications   Medication Sig Start Date End Date Taking? Authorizing Provider  azelastine (ASTELIN) 0.1 % nasal spray USE 2 SPRAYS EACH NOSTRIL TWICE A DAY. 05/04/15  Yes [name], MD  citalopram (CELEXA) 10 MG tablet Take 1 tablet (10 mg total) by mouth daily. Patient taking differently: Take 20 mg by mouth daily.  07/25/15  Yes [name], MD  Difluprednate (DUREZOL) 0.05 % EMUL Place 1 drop into both eyes 2 (two) times daily.    Yes Historical Provider, MD  fluticasone (FLONASE) 50 MCG/ACT nasal spray USE 2 SPRAYS IN EACH NOSTRIL ONCE DAILY 01/20/16  Yes Dorothyann Peng, NP  gabapentin (NEURONTIN) 100 MG capsule TAKE 1 CAPSULE AT BEDTIME. 10/10/15  Yes [name], MD  levothyroxine (SYNTHROID, LEVOTHROID) 50 MCG tablet Take 1 tablet (50 mcg total) by mouth daily. 07/28/15  Yes [name], MD  LORazepam (ATIVAN) 1 MG tablet TAKE 1 TABLET AT BEDTIME AS NEEDED. Patient taking differently: TAKE 1 TABLET AT BEDTIME 11/25/15  Yes [name], MD  magnesium oxide (MAG-OX) 400 MG tablet Take 400 mg by mouth daily.   Yes Historical Provider, MD  ofloxacin (OCUFLOX) 0.3 % ophthalmic solution Place 1 drop into both eyes 2 (two) times daily.   Yes Historical Provider, MD  omeprazole (PRILOSEC) 40 MG capsule TAKE 1 CAPSULE DAILY. 01/16/16  Yes [name], MD  polyethylene glycol [facility]) packet Take 17 g by mouth daily as needed for moderate constipation.   Yes Historical Provider,  MD  psyllium (HYDROCIL/METAMUCIL) 95 % PACK Take 1 packet by mouth daily as needed for moderate constipation.   Yes Historical Provider, MD  ciprofloxacin-dexamethasone (CIPRODEX) otic suspension Place 4 drops into both ears 2 (two) times daily. Patient not taking: Reported on 02/14/2016 07/25/15   [name], MD  HYDROcodone-homatropine (HYDROMET) 5-1.5 MG/5ML syrup Take 5 mLs by mouth every 4 (four) hours as needed. Patient not taking: Reported on 02/14/2016 03/30/15   [name], MD    Medications: . citalopram  20 mg Oral Daily  . famotidine (PEPCID) IV  20 mg Intravenous Q12H  . gabapentin  100 mg Oral QHS  . heparin  5,000 Units Subcutaneous Q8H  . levothyroxine  25 mcg Intravenous Daily  . piperacillin-tazobactam (ZOSYN)  IV  3.375 g Intravenous Q8H  . sodium chloride flush  3 mL Intravenous Q12H   . sodium chloride 75 mL/hr at 02/16/16 0326    Assessment/Plan Cholelithiasis with choledocholithiasis. Mild renal insuffiencey - ran a 1.54 FEN:  NPO/IV fluids ID: Zosyn day 3 DVT:  Heparin   Plan: ERCP today recheck labs in a.m. and if medically stable cholecystectomy tomorrow.    LOS: 2 days    Elfie Costanza 02/16/2016 [phone]

## 2016-02-16 NOTE — Consult Note (Signed)
[facility] CM Primary Care Navigator  02/16/2016  Allen Wheeler 13-Nov-1919 [id number]  Met with patient and son [name]) at the bedside to identify possible discharge needs. Patient states that right upper abdominal and back relentless pain with persistent dry heaves had led to this admission. Patient seemed restless verbalizing that pain attacks comes and goes.  Patient endorses Dr. Alysia Penna with [facility] [place] as his primary care provider.    [name] reports using [employer] at [place] to obtain medications with no difficulty.   Patient is independent with self care and manages his own medications at home straight out of the containers.   He is able to drive prior to admission. His friend Vickii Chafe) will be able provide transportation to his doctors' appointments per son. Both his son (lives in [place]) and his friend Vickii Chafe) will be primary caregivers at home who will alternately assist patient with his care needs.  Patient and son voiced understanding to call primary care provider's office once discharged, for a post discharge follow-up appointment within a week or sooner if needs arise.  Son and patient voiced no further needs or concerns at this time.   For additional questions please contact:  Edwena Felty A. Zora Glendenning, BSN, RN-BC [facility] PRIMARY CARE Navigator Cell: [phone]

## 2016-02-16 NOTE — Transfer of Care (Signed)
Immediate Anesthesia Transfer of Care Note  Patient: Allen Wheeler  Procedure(s) Performed: Procedure(s): ENDOSCOPIC RETROGRADE CHOLANGIOPANCREATOGRAPHY (ERCP) (N/A)  Patient Location: Endoscopy Unit  Anesthesia Type:General  Level of Consciousness: awake, alert  and oriented  Airway & Oxygen Therapy: Patient Spontanous Breathing and Patient connected to nasal cannula oxygen  Post-op Assessment: Report given to RN, Post -op Vital signs reviewed and stable and Patient moving all extremities X 4  Post vital signs: Reviewed and stable  Last Vitals:  Vitals:   02/16/16 0527 02/16/16 1033  BP: (!) 150/89 (!) 191/82  Pulse: 72   Resp: 19 (!) 75  Temp: 36.8 C 36.7 C    Last Pain:  Vitals:   02/16/16 1033  TempSrc: Oral  PainSc:          Complications: No apparent anesthesia complications

## 2016-02-16 NOTE — Anesthesia Preprocedure Evaluation (Addendum)
Anesthesia Evaluation  Patient identified by MRN, date of birth, ID band Patient awake    Reviewed: Allergy & Precautions, H&P , NPO status , Patient's Chart, lab work & pertinent test results  Airway Mallampati: III  TM Distance: >3 FB Neck ROM: Full    Dental no notable dental hx. (+) Teeth Intact, Dental Advisory Given   Pulmonary neg pulmonary ROS, former smoker,    Pulmonary exam normal breath sounds clear to auscultation       Cardiovascular negative cardio ROS   Rhythm:Regular Rate:Normal     Neuro/Psych Anxiety Depression negative neurological ROS     GI/Hepatic Neg liver ROS, GERD  Medicated and Controlled,  Endo/Other  Hypothyroidism   Renal/GU Renal InsufficiencyRenal disease  negative genitourinary   Musculoskeletal  (+) Arthritis , Osteoarthritis,    Abdominal   Peds  Hematology negative hematology ROS (+)   Anesthesia Other Findings   Reproductive/Obstetrics negative OB ROS                            Anesthesia Physical Anesthesia Plan  ASA: II  Anesthesia Plan: General   Post-op Pain Management:    Induction: Intravenous  Airway Management Planned: Oral ETT  Additional Equipment:   Intra-op Plan:   Post-operative Plan: Extubation in OR  Informed Consent: I have reviewed the patients History and Physical, chart, labs and discussed the procedure including the risks, benefits and alternatives for the proposed anesthesia with the patient or authorized representative who has indicated his/her understanding and acceptance.   Dental advisory given  Plan Discussed with: CRNA  Anesthesia Plan Comments:         Anesthesia Quick Evaluation

## 2016-02-16 NOTE — Anesthesia Postprocedure Evaluation (Signed)
Anesthesia Post Note  Patient: Allen Wheeler  Procedure(s) Performed: Procedure(s) (LRB): ENDOSCOPIC RETROGRADE CHOLANGIOPANCREATOGRAPHY (ERCP) (N/A)  Patient location during evaluation: PACU Anesthesia Type: General Level of consciousness: awake and alert Pain management: pain level controlled Vital Signs Assessment: post-procedure vital signs reviewed and stable Respiratory status: spontaneous breathing, nonlabored ventilation and respiratory function stable Cardiovascular status: blood pressure returned to baseline and stable Postop Assessment: no signs of nausea or vomiting Anesthetic complications: no    Last Vitals:  Vitals:   02/16/16 1305 02/16/16 1317  BP: (!) 156/76 (!) 148/93  Pulse: 77 72  Resp: (!) 21 13  Temp: 36.7 C     Last Pain:  Vitals:   02/16/16 1305  TempSrc: Oral  PainSc:                  Vonetta Foulk,W. EDMOND

## 2016-02-16 NOTE — Progress Notes (Signed)
[facility]                                                                              Patient Demographics  Allen Wheeler, is a 82 y.o. male, DOB - 1920/02/01, [id number]  Admit date - 02/14/2016   Admitting Physician Reubin Milan, MD  Outpatient Primary MD for the patient is [name], MD  Outpatient specialists:   LOS - 2  days    Chief Complaint  Patient presents with  . Abdominal Pain    pt after eating some cereal having lower mid abdominal pain        Brief summary   HPI: Allen Wheeler is a 82 y.o. male with medical history significant of seasonal allergies, anxiety, depression, GERD, nephrolithiasis, osteoarthritis Who presented with one-day history of right upper quadrant abdominal pain. Patient also reported persistent nausea and dry heaving after eating breakfast on the morning of admission. He had a similar attack 2 months ago with negative workup. Workup showed WBC of 14.2, hepatic panel function with AST of 356, ALT of 156, alkaline phosphatase 152 units. His total bilirubin of 2.3 mg/dL. CT abdomen and pelvis showed gallstones with mild intra-and extrahepatic biliary dilatation with possible stone in distal CBD at the head of pancreas. Patient was admitted to further workup   Assessment & Plan    Principal Problem:  Cholelithiasis with acute cholangitis with biliary obstruction, Sepsis ([place]) - LFTs trending up with bilirubin trending up to 4.3, continue IV Zosyn, IV fluids, pain control -MRCP Showed choledocholithiasis, CBD mildly dilated measuring 9 mm, no intrahepatic duct dilatation, some Bentley sludge and small gallstones lying dependently in the gallbladder.  - ERCP done today showed choledocholithiasis, complete removal was accomplished with biliary sphincterotomy and balloon extraction - Gen. surgery following, lap cholecystectomy in AM 11/10  Active Problems:   Anxiety, depression - Hold scheduled lorazepam 1 mg  orally while on fentanyl. - Continue Celexa 20 mg by mouth daily.    GERD Famotidine 20 mg IVP while nothing by mouth.    Hypothyroidism Change to IV Synthroid 25 MCG daily  Code Status: Full CODE STATUS  DVT Prophylaxis:  heparin  Family Communication: Discussed in detail with the patient, all imaging results, lab results explained to the patient and son at bed side   Disposition Plan:   Time Spent in minutes  25 minutes  Procedures:  CT abd  Consultants:   GI surgery  Antimicrobials:   zosyn   Medications  Scheduled Meds: . citalopram  20 mg Oral Daily  . famotidine (PEPCID) IV  20 mg Intravenous Q12H  . gabapentin  100 mg Oral QHS  . heparin  5,000 Units Subcutaneous Q8H  . levothyroxine  25 mcg Intravenous Daily  . piperacillin-tazobactam (ZOSYN)  IV  3.375 g Intravenous Q8H  . sodium chloride flush  3 mL Intravenous Q12H   Continuous Infusions: . sodium chloride 75 mL/hr at 02/16/16 1355   PRN Meds:.HYDROmorphone (DILAUDID) injection, ondansetron **OR** ondansetron (ZOFRAN) IV   Antibiotics   Anti-infectives    Start     Dose/Rate Route Frequency Ordered Stop   02/14/16 2100  piperacillin-tazobactam (  ZOSYN) IVPB 3.375 g     3.375 g 12.5 mL/hr over 240 Minutes Intravenous Every 8 hours 02/14/16 2051          Subjective:   Allen Wheeler was seen and examined today. Feeling better, no abd pain, no nausea, vomiting.  Patient denies dizziness, chest pain, shortness of breath, new weakness, numbess, tingling. No acute events overnight.    Objective:   Vitals:   02/16/16 1258 02/16/16 1305 02/16/16 1317 02/16/16 1356  BP: (!) 145/125 (!) 156/76 (!) 148/93 108/79  Pulse: (!) 35 77 72 62  Resp: 18 (!) 21 13 17   Temp:  98.1 F (36.7 C)  97.5 F (36.4 C)  TempSrc:  Oral  Oral  SpO2: 97% 94% 95% 100%  Weight:      Height:        Intake/Output Summary (Last 24 hours) at 02/16/16 1415 Last data filed at 02/16/16 1300  Gross per 24 hour    Intake             2620 ml  Output              200 ml  Net             2420 ml     Wt Readings from Last 3 Encounters:  02/14/16 65.8 kg (145 lb)  12/13/15 70.8 kg (156 lb)  07/25/15 75.3 kg (166 lb)     Exam  General: Alert and oriented x 3, NAD  HEENT:    Neck:   Cardiovascular: S1 S2 clear, RRR  Respiratory: CTAB  Gastrointestinal: Soft, nontender, nondistended, + bowel sounds  Ext: no cyanosis clubbing or edema  Neuro: no new deficits   Skin: No rashes  Psych: Normal affect and demeanor, alert and oriented x3    Data Reviewed:  I have personally reviewed following labs and imaging studies  Micro Results Recent Results (from the past 240 hour(s))  Surgical pcr screen     Status: None   Collection Time: 02/16/16  8:06 AM  Result Value Ref Range Status   MRSA, PCR NEGATIVE NEGATIVE Final   Staphylococcus aureus NEGATIVE NEGATIVE Final    Comment:        The Xpert SA Assay (FDA approved for NASAL specimens in patients over 79 years of age), is one component of a comprehensive surveillance program.  Test performance has been validated by [facility] for patients greater than or equal to 46 year old. It is not intended to diagnose infection nor to guide or monitor treatment.     Radiology Reports Dg Chest 2 View  Result Date: 02/14/2016 CLINICAL DATA:  Epigastric abdominal pain beginning this morning. Nausea. Former smoker. EXAM: CHEST  2 VIEW COMPARISON:  12/22/2013 FINDINGS: The patient is mildly rotated to the right. Cardiomediastinal silhouette is unchanged within this limitation. Normal heart size. There is mild interstitial coarsening which has mildly increased from the prior study. Mild right basilar opacity is present. A 9 mm nodule projects in the left lung base. No pleural effusion or pneumothorax is seen. No acute osseous abnormality is identified. IMPRESSION: 1. Mild bronchitic changes. 2. Mild right basilar opacity, likely atelectasis. 3.  Left lung base nodule. See CT abdomen and pelvis report from today. Electronically Signed   By: Logan Bores M.D.   On: 02/14/2016 17:14   Ct Abdomen Pelvis W Contrast  Addendum Date: 02/14/2016   ADDENDUM REPORT: 02/14/2016 17:46 ADDENDUM: Bilateral right greater than left fat filled inguinal hernias Electronically Signed  By: Donavan Foil M.D.   On: 02/14/2016 17:46   Result Date: 02/14/2016 CLINICAL DATA:  Upper abdominal pain after eating cereal.  Nausea EXAM: CT ABDOMEN AND PELVIS WITH CONTRAST TECHNIQUE: Multidetector CT imaging of the abdomen and pelvis was performed using the standard protocol following bolus administration of intravenous contrast. CONTRAST:  59mL ISOVUE-300 IOPAMIDOL (ISOVUE-300) INJECTION 61% COMPARISON:  None. FINDINGS: Lower chest: There is patchy dependent atelectasis. Mild right middle lobe and lingular atelectasis or scarring. No pleural effusions. There are multiple pulmonary nodules visualized at the lung bases. There it is a a 3 mm left upper lobe lateral lung nodule, series 205, image number 3. There is a a 4 mm nodule along the left fissure, series 205, image number 7. There is a 8 mm pulmonary nodule within the superior portion of left lower lobe, series 205, image number 1. There is additional 3 mm left subpleural posterior nodule, series 205, image number 9. There is a a 6 mm subpleural right lower lobe pulmonary nodule series 205, image number 5. Mild cardiomegaly. Coronary artery calcifications. Aortic valvular calcifications. Hepatobiliary: Mild intra hepatic biliary dilatation. Multiple calcified granuloma. The gallbladder is slightly enlarged. There are multiple small stones within the gallbladder. There is mild enlargement of the extrahepatic common bile duct, measuring up to 9 mm. There is high density material or possible stones within the distal common bile duct at the head of the pancreas, series 201, image number 40 and coronal series 203, image number 68.  Pancreas: Unremarkable. No pancreatic ductal dilatation or surrounding inflammatory changes. Spleen: Multiple calcified splenic granuloma. Spleen otherwise unremarkable. Adrenals/Urinary Tract: Adrenal glands are within normal limits. There are multiple hypodense lesions within the kidneys, many of which are too small to further characterize but favor cysts. The bladder is unremarkable. Stomach/Bowel: No dilated large or small bowel. Stomach grossly unremarkable. Appendix visualized and within normal limits. Vascular/Lymphatic: Ectatic abdominal aorta with mural thrombus and atherosclerosis. There is ectasia of the iliac arteries. No significantly enlarged pelvic or abdominal lymph nodes. Reproductive: Prostate gland is enlarged with calcifications. Other: No free air or free fluid Musculoskeletal: Degenerative changes of the spine. No suspicious osseous lesions. IMPRESSION: 1. Slightly enlarged gallbladder which contains small calcified stones. There is mild intra and extrahepatic biliary dilatation with small amount of high density material or possible stone within the distal common bile duct at the head of the pancreas. Recommend correlation with laboratory values, if obstruction is suggested, further evaluation with MR or ERCP may be obtained. 2. Multiple calcified granulomas within the liver and spleen. 3. Multiple pulmonary nodules within the bilateral lung bases. No follow-up needed if patient is low-risk (and has no known or suspected primary neoplasm). Non-contrast chest CT can be considered in 12 months if patient is high-risk. This recommendation follows the consensus statement: Guidelines for Management of Incidental Pulmonary Nodules Detected on CT Images: From the Fleischner Society 2017; Radiology 2017; 284:228-243. Electronically Signed: By: Donavan Foil M.D. On: 02/14/2016 17:10   Mr 3d Recon At Scanner  Result Date: 02/15/2016 CLINICAL DATA:  82 year old male with history of abnormal liver  function tests. Possible choledocholithiasis noted on prior CT of the abdomen and pelvis. Followup study. EXAM: MRI ABDOMEN WITHOUT AND WITH CONTRAST (INCLUDING MRCP) TECHNIQUE: Multiplanar multisequence MR imaging of the abdomen was performed both before and after the administration of intravenous contrast. Heavily T2-weighted images of the biliary and pancreatic ducts were obtained, and three-dimensional MRCP images were rendered by post processing. CONTRAST:  34mL MULTIHANCE  GADOBENATE DIMEGLUMINE 529 MG/ML IV SOLN COMPARISON:  No prior abdominal MRI. CT the abdomen and pelvis 02/14/2016. FINDINGS: Lower chest: Unremarkable. Hepatobiliary: No discrete cystic or solid hepatic lesions. MRCP images demonstrate no intrahepatic biliary ductal dilatation. Common bile duct measures up to 9 mm in the porta hepatis, which is mildly enlarged, but may be within normal limits for the patient's age. However, MRCP images also demonstrate 2 filling defects within the distal common bile duct, largest of which measures 9 mm, compatible with choledocholithiasis. Gallbladder is moderately distended. Within the dependent portion of the gallbladder there is some amorphous material which is T1 isointense and less T2 hyperintense than normal bile, compatible with biliary sludge. Small signal voids within this also represent gallstones. Gallbladder wall does not appear thickened, however, there is a trace amount of pericholecystic fluid. Pancreas: In the anterior aspect of the pancreatic head there is a 6 mm T1 hypointense, T2 hyperintense, nonenhancing lesion, likely to represent a tiny pancreatic pseudocyst. No other larger pancreatic mass. No pancreatic ductal dilatation. No pancreatic or peripancreatic fluid or inflammatory changes. Spleen:  Unremarkable. Adrenals/Urinary Tract: Multiple T1 hypointense, T2 hyperintense, nonenhancing lesions associated with both kidneys, compatible with a combination of simple cysts and parapelvic  cysts. No hydroureteronephrosis in the visualized portions of the abdomen. Bilateral adrenal glands are normal in appearance. Stomach/Bowel: Several colonic diverticulae are noted in the region of the descending colon. Otherwise, unremarkable. Vascular/Lymphatic: Extensive atherosclerosis in the abdominal vasculature, without aneurysm. No lymphadenopathy noted in the abdomen. Other: No significant volume of ascites. Bilateral perinephric stranding (nonspecific). Musculoskeletal: No aggressive osseous lesions are noted in the visualized portions of the skeleton. IMPRESSION: 1. Study is positive for choledocholithiasis. Common bile duct is mildly dilated measuring 9 mm, however, this may be within normal limits given the patient's advanced age. No intrahepatic biliary ductal dilatation. There is also some biliary sludge and small gallstones lying dependently in the gallbladder. Gallbladder wall does not appear thickened, however, there is a small volume of pericholecystic fluid. Clinical correlation for signs and symptoms of acute cholecystitis is suggested as these findings suspicious but not definitive. 2. Aortic atherosclerosis. 3. Colonic diverticulosis. 4. Additional incidental findings, as above. Electronically Signed   By: Vinnie Langton M.D.   On: 02/15/2016 12:58   Dg Ercp Biliary & Pancreatic Ducts  Result Date: 02/16/2016 CLINICAL DATA:  Cholelithiasis EXAM: ERCP TECHNIQUE: Multiple spot images obtained with the fluoroscopic device and submitted for interpretation post-procedure. FLUOROSCOPY TIME:  Fluoroscopy Time:  3 minutes and 40 seconds Radiation Exposure Index (if provided by the fluoroscopic device): Number of Acquired Spot Images: 5 COMPARISON:  None. FINDINGS: Contrast fills the biliary tree. Balloon stone retrieval is documented. IMPRESSION: See above. These images were submitted for radiologic interpretation only. Please see the procedural report for the amount of contrast and the  fluoroscopy time utilized. Electronically Signed   By: Marybelle Killings M.D.   On: 02/16/2016 13:29   Mr Abdomen Mrcp Moise Boring Contast  Result Date: 02/15/2016 CLINICAL DATA:  82 year old male with history of abnormal liver function tests. Possible choledocholithiasis noted on prior CT of the abdomen and pelvis. Followup study. EXAM: MRI ABDOMEN WITHOUT AND WITH CONTRAST (INCLUDING MRCP) TECHNIQUE: Multiplanar multisequence MR imaging of the abdomen was performed both before and after the administration of intravenous contrast. Heavily T2-weighted images of the biliary and pancreatic ducts were obtained, and three-dimensional MRCP images were rendered by post processing. CONTRAST:  48mL MULTIHANCE GADOBENATE DIMEGLUMINE 529 MG/ML IV SOLN COMPARISON:  No prior  abdominal MRI. CT the abdomen and pelvis 02/14/2016. FINDINGS: Lower chest: Unremarkable. Hepatobiliary: No discrete cystic or solid hepatic lesions. MRCP images demonstrate no intrahepatic biliary ductal dilatation. Common bile duct measures up to 9 mm in the porta hepatis, which is mildly enlarged, but may be within normal limits for the patient's age. However, MRCP images also demonstrate 2 filling defects within the distal common bile duct, largest of which measures 9 mm, compatible with choledocholithiasis. Gallbladder is moderately distended. Within the dependent portion of the gallbladder there is some amorphous material which is T1 isointense and less T2 hyperintense than normal bile, compatible with biliary sludge. Small signal voids within this also represent gallstones. Gallbladder wall does not appear thickened, however, there is a trace amount of pericholecystic fluid. Pancreas: In the anterior aspect of the pancreatic head there is a 6 mm T1 hypointense, T2 hyperintense, nonenhancing lesion, likely to represent a tiny pancreatic pseudocyst. No other larger pancreatic mass. No pancreatic ductal dilatation. No pancreatic or peripancreatic fluid or  inflammatory changes. Spleen:  Unremarkable. Adrenals/Urinary Tract: Multiple T1 hypointense, T2 hyperintense, nonenhancing lesions associated with both kidneys, compatible with a combination of simple cysts and parapelvic cysts. No hydroureteronephrosis in the visualized portions of the abdomen. Bilateral adrenal glands are normal in appearance. Stomach/Bowel: Several colonic diverticulae are noted in the region of the descending colon. Otherwise, unremarkable. Vascular/Lymphatic: Extensive atherosclerosis in the abdominal vasculature, without aneurysm. No lymphadenopathy noted in the abdomen. Other: No significant volume of ascites. Bilateral perinephric stranding (nonspecific). Musculoskeletal: No aggressive osseous lesions are noted in the visualized portions of the skeleton. IMPRESSION: 1. Study is positive for choledocholithiasis. Common bile duct is mildly dilated measuring 9 mm, however, this may be within normal limits given the patient's advanced age. No intrahepatic biliary ductal dilatation. There is also some biliary sludge and small gallstones lying dependently in the gallbladder. Gallbladder wall does not appear thickened, however, there is a small volume of pericholecystic fluid. Clinical correlation for signs and symptoms of acute cholecystitis is suggested as these findings suspicious but not definitive. 2. Aortic atherosclerosis. 3. Colonic diverticulosis. 4. Additional incidental findings, as above. Electronically Signed   By: Vinnie Langton M.D.   On: 02/15/2016 12:58   US Abdomen Limited Ruq  Result Date: 02/14/2016 CLINICAL DATA:  Right upper quadrant pain for 1 day. EXAM: US ABDOMEN LIMITED - RIGHT UPPER QUADRANT COMPARISON:  CT abdomen and pelvis 02/14/2016 FINDINGS: Gallbladder: The gallbladder wall thickening (or combination of gallbladder wall and trace pericholecystic fluid) measures up to 7 mm in thickness. Small amount of gallbladder sludge. No sonographic Murphy sign noted by  sonographer. Common bile duct: Diameter: 8 mm.  Distal portion not well visualized. Liver: Mild intrahepatic biliary dilatation. No focal liver lesion identified, however the left lobe was suboptimally visualized due to bowel gas. IMPRESSION: 1. Small amount of gallbladder sludge with mild gallbladder wall thickening (and/or trace pericholecystic fluid). Acute cholecystitis is not excluded in the appropriate clinical setting, however sonographic Murphy sign was negative. 2. Mild intrahepatic and borderline extrahepatic biliary dilatation. The distal common bile duct was not well seen. MRCP or ERCP could be considered to further evaluate the possible distal common duct stone or other debris described on CT. Electronically Signed   By: Logan Bores M.D.   On: 02/14/2016 18:06    Lab Data:  CBC:  Recent Labs Lab 02/14/16 1425 02/15/16 0513 02/16/16 0348  WBC 14.2* 14.0* 12.1*  NEUTROABS  --  12.4* 10.1*  HGB 15.2 13.4 13.6  HCT 44.4 39.2 39.1  MCV 93.9 93.8 92.7  PLT 163 154 [id number]*   Basic Metabolic Panel:  Recent Labs Lab 02/14/16 1425 02/15/16 0513 02/16/16 0348  NA 137 137 137  K 4.2 4.8 3.6  CL 103 106 104  CO2 22 22 21*  GLUCOSE 179* 131* 118*  BUN 22* 16 16  CREATININE 1.31* 1.55* 1.54*  CALCIUM 9.8 9.0 8.7*   GFR: Estimated Creatinine Clearance: 26.5 mL/min (by C-G formula based on SCr of 1.54 mg/dL (H)). Liver Function Tests:  Recent Labs Lab 02/14/16 1425 02/15/16 0513 02/16/16 0348  AST 356* 1,149* 427*  ALT 156* 823* 555*  ALKPHOS 152* 170* 162*  BILITOT 2.3* 4.3* 4.7*  PROT 8.0 6.6 6.8  ALBUMIN 3.9 3.0* 3.2*    Recent Labs Lab 02/14/16 1425  LIPASE 51   No results for input(s): AMMONIA in the last 168 hours. Coagulation Profile:  Recent Labs Lab 02/15/16 0513  INR 1.20   Cardiac Enzymes: No results for input(s): CKTOTAL, CKMB, CKMBINDEX, TROPONINI in the last 168 hours. BNP (last 3 results) No results for input(s): PROBNP in the last 8760  hours. HbA1C: No results for input(s): HGBA1C in the last 72 hours. CBG:  Recent Labs Lab 02/15/16 0013 02/15/16 0635 02/15/16 1227 02/16/16 0001 02/16/16 0616  GLUCAP 179* 127* 109* 100* 124*   Lipid Profile: No results for input(s): CHOL, HDL, LDLCALC, TRIG, CHOLHDL, LDLDIRECT in the last 72 hours. Thyroid Function Tests: No results for input(s): TSH, T4TOTAL, FREET4, T3FREE, THYROIDAB in the last 72 hours. Anemia Panel: No results for input(s): VITAMINB12, FOLATE, FERRITIN, TIBC, IRON, RETICCTPCT in the last 72 hours. Urine analysis:    Component Value Date/Time   COLORURINE YELLOW 02/14/2016 1506   APPEARANCEUR CLEAR 02/14/2016 1506   LABSPEC 1.018 02/14/2016 1506   PHURINE 6.5 02/14/2016 1506   GLUCOSEU NEGATIVE 02/14/2016 1506   HGBUR NEGATIVE 02/14/2016 1506   BILIRUBINUR NEGATIVE 02/14/2016 1506   BILIRUBINUR n 07/25/2015 1236   KETONESUR 40 (A) 02/14/2016 1506   PROTEINUR NEGATIVE 02/14/2016 1506   UROBILINOGEN 0.2 07/25/2015 1236   UROBILINOGEN 1.0 12/22/2013 0341   NITRITE NEGATIVE 02/14/2016 1506   LEUKOCYTESUR NEGATIVE 02/14/2016 1506     Chery Giusto M.D. [facility] 02/16/2016, 2:15 PM  Pager: [phone] Between 7am to 7pm - call Pager - [phone]  After 7pm go to [email or web address] - password [email or web address]  Call night coverage person covering after 7pm

## 2016-02-16 NOTE — Anesthesia Procedure Notes (Signed)
Procedure Name: Intubation Date/Time: 02/16/2016 11:29 AM Performed by: Mariea Clonts Pre-anesthesia Checklist: Patient identified, Emergency Drugs available, Suction available, Patient being monitored and Timeout performed Patient Re-evaluated:Patient Re-evaluated prior to inductionOxygen Delivery Method: Circle system utilized Preoxygenation: Pre-oxygenation with 100% oxygen Intubation Type: IV induction Ventilation: Oral airway inserted - appropriate to patient size Laryngoscope Size: Mac and 3 Grade View: Grade I Tube type: Oral Tube size: 7.0 mm Number of attempts: 1 Airway Equipment and Method: Stylet and Bite block Placement Confirmation: ETT inserted through vocal cords under direct vision,  positive ETCO2 and breath sounds checked- equal and bilateral Secured at: 23 cm Tube secured with: Tape

## 2016-02-16 NOTE — Op Note (Signed)
[facility] Patient Name: Allen Wheeler Procedure Date : 02/16/2016 MRN: [id number] Attending MD: Missy Sabins , MD Date of Birth: December 04, 1919 CSN: [id number] Age: 82 Admit Type: Inpatient Procedure:                ERCP Indications:              For therapy of bile duct stone(s) Providers:                Elyse Jarvis. Amedeo Plenty, MD, Dortha Schwalbe RN, RN, Ralene Bathe, Technician, Virgilio Belling. Beckner, CRNA Referring MD:              Medicines:                General Anesthesia Complications:            No immediate complications. Estimated Blood Loss:     Estimated blood loss: none. Procedure:                Pre-Anesthesia Assessment:                           - Prior to the procedure, a History and Physical                            was performed, and patient medications and                            allergies were reviewed. The patient's tolerance of                            previous anesthesia was also reviewed. The risks                            and benefits of the procedure and the sedation                            options and risks were discussed with the patient.                            All questions were answered, and informed consent                            was obtained. Prior Anticoagulants: The patient has                            taken no previous anticoagulant or antiplatelet                            agents. ASA Grade Assessment: III - A patient with                            severe systemic disease. After reviewing the risks  and benefits, the patient was deemed in                            satisfactory condition to undergo the procedure.                           After obtaining informed consent, the scope was                            passed under direct vision. Throughout the                            procedure, the patient's blood pressure, pulse, and                            oxygen  saturations were monitored continuously. The                            [id number]) scope was introduced through                            the mouth, and used to inject contrast into and                            used to inject contrast into the bile duct and                            ventral pancreatic duct. The ERCP was performed                            with moderate difficulty due to challenging                            cannulation. The patient tolerated the procedure                            well. Scope In: Scope Out: Findings:      The major papilla was bulging. The minor papilla was normal. The bile       duct was deeply cannulated. Contrast was injected. I personally       interpreted the bile duct images. Ductal flow of contrast was adequate.       Image quality was adequate. Contrast extended to the main bile duct.       Opacification of the main bile duct was successful. The maximum diameter       of the ducts was 10 mm. The lower third of the main bile duct contained       one stone, which was 9 mm in diameter. The biliary sphincterotomy was       extended to a total of 10 mm in length with a traction (standard)       sphincterotome using ERBE electrocautery. The sphincterotomy oozed       blood. The biliary tree was swept with a 15 mm balloon starting at the       lower third of the main duct. One stone  was removed. No stones remained.       A 0.035 inch x 260 cm angled Hydra Jagwire was passed into the ventral       pancreatic duct. Impression:               - The major papilla appeared to be bulging.                           - The minor papilla appeared normal.                           - Choledocholithiasis was found. Complete removal                            was accomplished by biliary sphincterotomy and                            balloon extraction.                           - A biliary sphincterotomy was performed.                           - The  biliary tree was swept. Recommendation:           - Observe patient's clinical course.                           - Check liver enzymes (AST, ALT, alkaline                            phosphatase, bilirubin) tomorrow. Procedure Code(s):        --- Professional ---                           [phone], Endoscopic retrograde                            cholangiopancreatography (ERCP); with removal of                            calculi/debris from biliary/pancreatic duct(s)                           43262, Endoscopic retrograde                            cholangiopancreatography (ERCP); with                            sphincterotomy/papillotomy Diagnosis Code(s):        --- Professional ---                           K83.9, Disease of biliary tract, unspecified                           K80.50, Calculus of bile duct without cholangitis  or cholecystitis without obstruction CPT copyright 2016 [employer]. All rights reserved. The codes documented in this report are preliminary and upon coder review may  be revised to meet current compliance requirements. Missy Sabins, MD 02/16/2016 12:52:29 PM This report has been signed electronically. Number of Addenda: 0

## 2016-02-17 ENCOUNTER — Encounter (HOSPITAL_COMMUNITY)
Admission: EM | Disposition: A | Discharge status: Home Health | Payer: Self-pay | Source: Home / Self Care | Attending: Internal Medicine

## 2016-02-17 ENCOUNTER — Inpatient Hospital Stay (HOSPITAL_COMMUNITY): Payer: Medicare Other

## 2016-02-17 DIAGNOSIS — R29898 Other symptoms and signs involving the musculoskeletal system: Secondary | ICD-10-CM

## 2016-02-17 LAB — CBC WITH DIFFERENTIAL/PLATELET
Basophils Absolute: 0 10*3/uL (ref 0.0–0.1)
Basophils Relative: 0 %
EOS ABS: 0.1 10*3/uL (ref 0.0–0.7)
Eosinophils Relative: 1 %
HCT: 36.4 % — ABNORMAL LOW (ref 39.0–52.0)
HEMOGLOBIN: 12.5 g/dL — AB (ref 13.0–17.0)
LYMPHS ABS: 1.2 10*3/uL (ref 0.7–4.0)
LYMPHS PCT: 14 %
MCH: 31.7 pg (ref 26.0–34.0)
MCHC: 34.3 g/dL (ref 30.0–36.0)
MCV: 92.4 fL (ref 78.0–100.0)
Monocytes Absolute: 0.9 10*3/uL (ref 0.1–1.0)
Monocytes Relative: 10 %
NEUTROS ABS: 6.5 10*3/uL (ref 1.7–7.7)
NEUTROS PCT: 75 %
Platelets: 133 10*3/uL — ABNORMAL LOW (ref 150–400)
RBC: 3.94 MIL/uL — AB (ref 4.22–5.81)
RDW: 13.5 % (ref 11.5–15.5)
WBC: 8.6 10*3/uL (ref 4.0–10.5)

## 2016-02-17 LAB — COMPREHENSIVE METABOLIC PANEL
ALT: 328 U/L — ABNORMAL HIGH (ref 17–63)
ANION GAP: 8 (ref 5–15)
AST: 155 U/L — ABNORMAL HIGH (ref 15–41)
Albumin: 2.6 g/dL — ABNORMAL LOW (ref 3.5–5.0)
Alkaline Phosphatase: 137 U/L — ABNORMAL HIGH (ref 38–126)
BUN: 8 mg/dL (ref 6–20)
CHLORIDE: 104 mmol/L (ref 101–111)
CO2: 25 mmol/L (ref 22–32)
Calcium: 7.9 mg/dL — ABNORMAL LOW (ref 8.9–10.3)
Creatinine, Ser: 1.28 mg/dL — ABNORMAL HIGH (ref 0.61–1.24)
GFR, EST AFRICAN AMERICAN: 53 mL/min — AB (ref >60)
GFR, EST NON AFRICAN AMERICAN: 46 mL/min — AB (ref >60)
Glucose, Bld: 90 mg/dL (ref 65–99)
POTASSIUM: 3.1 mmol/L — AB (ref 3.5–5.1)
Sodium: 137 mmol/L (ref 135–145)
Total Bilirubin: 2.7 mg/dL — ABNORMAL HIGH (ref 0.3–1.2)
Total Protein: 5.9 g/dL — ABNORMAL LOW (ref 6.5–8.1)

## 2016-02-17 LAB — GLUCOSE, CAPILLARY
GLUCOSE-CAPILLARY: 91 mg/dL (ref 65–99)
GLUCOSE-CAPILLARY: 101 mg/dL — AB (ref 65–99)
Glucose-Capillary: 95 mg/dL (ref 65–99)

## 2016-02-17 LAB — LIPASE, BLOOD: LIPASE: 99 U/L — AB (ref 11–51)

## 2016-02-17 SURGERY — LAPAROSCOPIC CHOLECYSTECTOMY WITH INTRAOPERATIVE CHOLANGIOGRAM
Anesthesia: General

## 2016-02-17 MED ORDER — POTASSIUM CHLORIDE 2 MEQ/ML IV SOLN
30.0000 meq | Freq: Once | INTRAVENOUS | Status: AC
  Administered 2016-02-17: 30 meq via INTRAVENOUS
  Filled 2016-02-17: qty 15

## 2016-02-17 MED ORDER — LORAZEPAM 1 MG PO TABS
1.0000 mg | ORAL_TABLET | Freq: Once | ORAL | Status: AC
  Administered 2016-02-17: 1 mg via ORAL
  Filled 2016-02-17: qty 1

## 2016-02-17 MED ORDER — CEFAZOLIN SODIUM-DEXTROSE 2-4 GM/100ML-% IV SOLN
2.0000 g | INTRAVENOUS | Status: DC
  Filled 2016-02-17: qty 100

## 2016-02-17 NOTE — Consult Note (Signed)
Neurology Consultation Reason for Consult: left hand weakness Referring Physician: Dr. 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 Found to have choledocholithiasis with possible cholangitis. Started on abx. Had ERCP. Choledocholithiasis was found. Complete removal was accomplished by biliary sphincterotomy and balloon extraction. Was supposed to be have lap cholecystecomy 11/10 however he developed some left arm clumsiness. Patient unsure when it started (states could be yesterday, day before yesterday, or before that) but RN and family noticed it mainly after ERCP yesterday. Has trouble grasping his cup with left hand, instead of grabbing it, he pushed the cup and the water fell down yesterday. Had some baseline difficulty with his hands due to OA changes but the weakness is new. No previous hx of stroke. No numbness/tingling/vision changes.    LKW: unclear tpa given?: no, out of window Premorbid modified rankin scale: 1.   Review of Systems  Constitutional: Negative for chills and fever.  Eyes: Negative for blurred vision and double vision.  Cardiovascular: Negative for chest pain and palpitations.  Skin: Negative for rash.  Neurological: Positive for weakness. Negative for sensory change, seizures, loss of consciousness and headaches.  Psychiatric/Behavioral: Negative for depression.    Past Medical History:  Diagnosis Date  . Allergy   . Anxiety   . Chickenpox   . Depression   . GERD (gastroesophageal reflux disease)   . Hearing loss    wears hearing aids  . Nephrolithiasis   . Normal cardiac stress test 09-20-12  . Osteoarthritis     Family History  Problem Relation Age of Onset  . Heart disease Mother     Pacemaker   . Colon cancer Father     Social History:  reports that he has quit smoking. He has never used smokeless tobacco. He reports that he drinks about 10.8 oz of alcohol per week . He reports that he does not use drugs.  Exam: Current vital signs: BP 119/63 (BP Location: Right Arm)   Pulse (!) 59   Temp 98.5 F (36.9 C) (Oral)   Resp 16   Ht 5\' 6"  (1.676 m)   Wt 145 lb (65.8 kg)   SpO2 96%   BMI 23.40 kg/m  Vital signs in last 24 hours: Temp:  [97.5 F (36.4 C)-98.5 F (36.9 C)] 98.5 F (36.9 C) (11/10 0604) Pulse Rate:  [35-77] 59 (11/10 0604) Resp:  [13-21] 16 (11/10 0604) BP: (108-156)/(58-125) 119/63 (11/10 0604) SpO2:  [94 %-100 %] 96 % (11/10 0604)   Physical Exam  Constitutional: Appears well-developed and well-nourished, appears younger than his age. Psych: Affect appropriate to situation Eyes: No scleral injection HENT: No OP obstrucion Head: Normocephalic.  Cardiovascular: Normal rate and regular rhythm.  Respiratory: Effort normal and breath sounds normal to anterior ascultation GI: Soft.  No distension. There is no tenderness.  Skin: WDI  Neuro: Mental Status: Patient is awake, alert, oriented to person, place, month, year, and situation. Patient is able to give a clear and coherent history. No signs of aphasia or neglect Cranial Nerves: II: Visual Fields are full. Pupils are equal, round, and reactive to light.   III,IV, VI: EOMI without ptosis or diploplia.  V: Facial sensation is symmetric to temperature VII: Facial movement is symmetric.  VIII: hearing is intact to voice X: Uvula  elevates symmetrically XI: Shoulder shrug is symmetric. XII: tongue is midline without atrophy or fasciculations.  Motor: Tone is normal. Bulk is normal. 5/5 strength everywhere except left hand/wrist.  Both hands and wrists have significant OA related changes including subluxation left 1st MCP,  Has weakness to left wrist extension, finger extensions, abduction and  adduction of fingers, and thumb movements at all directions.   Sensory: Sensation is symmetric to light touch and temperature in the arms and legs. Deep Tendon Reflexes: 2+ and symmetric in the biceps and patellae.  Plantars: Toes are downgoing bilaterally.  Cerebellar FNF and HKS are intact bilaterally      I have reviewed labs in epic and the results pertinent to this consultation are: abnl LFT, trending down. Wbc trending down.  I have reviewed the images obtained:   Impression:   82 yo without any hx of stroke here with acute cholecystitis/cholangitis developed left hand weakness after ERCP. Weakness involves at least radial, ulnar, and median nerve areas. Unclear where the lesion is but will need brain MRI.  Recommendations: 1) MRI to look for infarct. If has stroke, will need to delay cholecystectomy, otherwise may be able to proceed. 2) OT and PT.   Dellia Nims, M.D. PGY-3

## 2016-02-17 NOTE — Progress Notes (Signed)
[facility]                                                                              Patient Demographics  Allen Wheeler, is a 82 y.o. male, DOB - May 27, 1919, [id number]  Admit date - 02/14/2016   Admitting Physician Reubin Milan, MD  Outpatient Primary MD for the patient is [name], MD  Outpatient specialists:   LOS - 3  days    Chief Complaint  Patient presents with  . Abdominal Pain    pt after eating some cereal having lower mid abdominal pain        Brief summary   HPI: Allen Wheeler is a 82 y.o. male with medical history significant of seasonal allergies, anxiety, depression, GERD, nephrolithiasis, osteoarthritis Who presented with one-day history of right upper quadrant abdominal pain. Patient also reported persistent nausea and dry heaving after eating breakfast on the morning of admission. He had a similar attack 2 months ago with negative workup. Workup showed WBC of 14.2, hepatic panel function with AST of 356, ALT of 156, alkaline phosphatase 152 units. His total bilirubin of 2.3 mg/dL. CT abdomen and pelvis showed gallstones with mild intra-and extrahepatic biliary dilatation with possible stone in distal CBD at the head of pancreas. Patient was admitted to further workup   Assessment & Plan    Principal Problem:  Cholelithiasis with acute cholangitis with biliary obstruction, Sepsis ([place]) - LFTs trending up with bilirubin trending up to 4.3, continue IV Zosyn, IV fluids, pain control -MRCP Showed choledocholithiasis, CBD mildly dilated measuring 9 mm, no intrahepatic duct dilatation, some Bentley sludge and small gallstones lying dependently in the gallbladder.  - ERCP done today showed choledocholithiasis, complete removal was accomplished with biliary sphincterotomy and balloon extraction - Gen. surgery following, lap cholecystectomy in AM, now delayed due to acute neurological issue today   Left Hand weakness - MRI of  the brain done negative for acute CVA - Neurology consulted, will follow recommendations    Anxiety, depression - Continue Celexa 20 mg by mouth daily.    GERD Famotidine 20 mg IVP while nothing by mouth.    Hypothyroidism Change to IV Synthroid 25 MCG daily  Code Status: Full CODE STATUS  DVT Prophylaxis:  heparin  Family Communication: Discussed in detail with the patient, all imaging results, lab results explained to the patient   Disposition Plan:   Time Spent in minutes  25 minutes  Procedures:  CT abd  Consultants:   GI surgery  Antimicrobials:   zosyn   Medications  Scheduled Meds: .  ceFAZolin (ANCEF) IV  2 g Intravenous To SSTC  . citalopram  20 mg Oral Daily  . famotidine (PEPCID) IV  20 mg Intravenous Q12H  . gabapentin  100 mg Oral QHS  . heparin  5,000 Units Subcutaneous Q8H  . levothyroxine  25 mcg Intravenous Daily  . piperacillin-tazobactam (ZOSYN)  IV  3.375 g Intravenous Q8H  . potassium chloride (KCL MULTIRUN) 30 mEq in 265 mL IVPB  30 mEq Intravenous Once  . sodium chloride flush  3 mL Intravenous Q12H   Continuous Infusions: . sodium chloride 75  mL/hr at 02/16/16 1355   PRN Meds:.HYDROmorphone (DILAUDID) injection, ondansetron **OR** ondansetron (ZOFRAN) IV   Antibiotics   Anti-infectives    Start     Dose/Rate Route Frequency Ordered Stop   02/17/16 0600  ceFAZolin (ANCEF) IVPB 2g/100 mL premix     2 g 200 mL/hr over 30 Minutes Intravenous To Short Stay 02/16/16 1606 02/18/16 0600   02/14/16 2100  piperacillin-tazobactam (ZOSYN) IVPB 3.375 g     3.375 g 12.5 mL/hr over 240 Minutes Intravenous Every 8 hours 02/14/16 2051          Subjective:   Allen Wheeler was seen and examined today. Still some pain under the right rib cage. C/o weakness in the left hand/arm, unsure when it all started.  Patient denies dizziness, chest pain, shortness of breath, nausea or vomiting.     Objective:   Vitals:   02/16/16 1356 02/16/16  2045 02/17/16 0142 02/17/16 0604  BP: 108/79 (!) 115/58 115/67 119/63  Pulse: 62 (!) 58 65 (!) 59  Resp: 17 16 18 16   Temp: 97.5 F (36.4 C) 98.1 F (36.7 C) 97.7 F (36.5 C) 98.5 F (36.9 C)  TempSrc: Oral Oral Oral Oral  SpO2: 100% 97% 96% 96%  Weight:      Height:        Intake/Output Summary (Last 24 hours) at 02/17/16 1216 Last data filed at 02/17/16 [id number]  Gross per 24 hour  Intake          2628.75 ml  Output              100 ml  Net          2528.75 ml     Wt Readings from Last 3 Encounters:  02/14/16 65.8 kg (145 lb)  12/13/15 70.8 kg (156 lb)  07/25/15 75.3 kg (166 lb)     Exam  General: Alert and oriented x 3, NAD  HEENT:    Neck:   Cardiovascular: S1 S2 clear, RRR  Respiratory: CTAB  Gastrointestinal: Soft, nontender, nondistended, + bowel sounds  Ext: no cyanosis clubbing or edema  Neuro: weakness in the left hand+, otherwise, 5/5 in right upper and lower extremity, LLE  Skin: No rashes  Psych: Normal affect and demeanor, alert and oriented x3    Data Reviewed:  I have personally reviewed following labs and imaging studies  Micro Results Recent Results (from the past 240 hour(s))  Surgical pcr screen     Status: None   Collection Time: 02/16/16  8:06 AM  Result Value Ref Range Status   MRSA, PCR NEGATIVE NEGATIVE Final   Staphylococcus aureus NEGATIVE NEGATIVE Final    Comment:        The Xpert SA Assay (FDA approved for NASAL specimens in patients over 71 years of age), is one component of a comprehensive surveillance program.  Test performance has been validated by [facility] for patients greater than or equal to 73 year old. It is not intended to diagnose infection nor to guide or monitor treatment.     Radiology Reports Dg Chest 2 View  Result Date: 02/14/2016 CLINICAL DATA:  Epigastric abdominal pain beginning this morning. Nausea. Former smoker. EXAM: CHEST  2 VIEW COMPARISON:  12/22/2013 FINDINGS: The patient is  mildly rotated to the right. Cardiomediastinal silhouette is unchanged within this limitation. Normal heart size. There is mild interstitial coarsening which has mildly increased from the prior study. Mild right basilar opacity is present. A 9 mm nodule projects in the  left lung base. No pleural effusion or pneumothorax is seen. No acute osseous abnormality is identified. IMPRESSION: 1. Mild bronchitic changes. 2. Mild right basilar opacity, likely atelectasis. 3. Left lung base nodule. See CT abdomen and pelvis report from today. Electronically Signed   By: Logan Bores M.D.   On: 02/14/2016 17:14   Ct Abdomen Pelvis W Contrast  Addendum Date: 02/14/2016   ADDENDUM REPORT: 02/14/2016 17:46 ADDENDUM: Bilateral right greater than left fat filled inguinal hernias Electronically Signed   By: Donavan Foil M.D.   On: 02/14/2016 17:46   Result Date: 02/14/2016 CLINICAL DATA:  Upper abdominal pain after eating cereal.  Nausea EXAM: CT ABDOMEN AND PELVIS WITH CONTRAST TECHNIQUE: Multidetector CT imaging of the abdomen and pelvis was performed using the standard protocol following bolus administration of intravenous contrast. CONTRAST:  17mL ISOVUE-300 IOPAMIDOL (ISOVUE-300) INJECTION 61% COMPARISON:  None. FINDINGS: Lower chest: There is patchy dependent atelectasis. Mild right middle lobe and lingular atelectasis or scarring. No pleural effusions. There are multiple pulmonary nodules visualized at the lung bases. There it is a a 3 mm left upper lobe lateral lung nodule, series 205, image number 3. There is a a 4 mm nodule along the left fissure, series 205, image number 7. There is a 8 mm pulmonary nodule within the superior portion of left lower lobe, series 205, image number 1. There is additional 3 mm left subpleural posterior nodule, series 205, image number 9. There is a a 6 mm subpleural right lower lobe pulmonary nodule series 205, image number 5. Mild cardiomegaly. Coronary artery calcifications. Aortic  valvular calcifications. Hepatobiliary: Mild intra hepatic biliary dilatation. Multiple calcified granuloma. The gallbladder is slightly enlarged. There are multiple small stones within the gallbladder. There is mild enlargement of the extrahepatic common bile duct, measuring up to 9 mm. There is high density material or possible stones within the distal common bile duct at the head of the pancreas, series 201, image number 40 and coronal series 203, image number 68. Pancreas: Unremarkable. No pancreatic ductal dilatation or surrounding inflammatory changes. Spleen: Multiple calcified splenic granuloma. Spleen otherwise unremarkable. Adrenals/Urinary Tract: Adrenal glands are within normal limits. There are multiple hypodense lesions within the kidneys, many of which are too small to further characterize but favor cysts. The bladder is unremarkable. Stomach/Bowel: No dilated large or small bowel. Stomach grossly unremarkable. Appendix visualized and within normal limits. Vascular/Lymphatic: Ectatic abdominal aorta with mural thrombus and atherosclerosis. There is ectasia of the iliac arteries. No significantly enlarged pelvic or abdominal lymph nodes. Reproductive: Prostate gland is enlarged with calcifications. Other: No free air or free fluid Musculoskeletal: Degenerative changes of the spine. No suspicious osseous lesions. IMPRESSION: 1. Slightly enlarged gallbladder which contains small calcified stones. There is mild intra and extrahepatic biliary dilatation with small amount of high density material or possible stone within the distal common bile duct at the head of the pancreas. Recommend correlation with laboratory values, if obstruction is suggested, further evaluation with MR or ERCP may be obtained. 2. Multiple calcified granulomas within the liver and spleen. 3. Multiple pulmonary nodules within the bilateral lung bases. No follow-up needed if patient is low-risk (and has no known or suspected primary  neoplasm). Non-contrast chest CT can be considered in 12 months if patient is high-risk. This recommendation follows the consensus statement: Guidelines for Management of Incidental Pulmonary Nodules Detected on CT Images: From the Fleischner Society 2017; Radiology 2017; 284:228-243. Electronically Signed: By: Donavan Foil M.D. On: 02/14/2016 17:10  Mr 3d Recon At Scanner  Result Date: 02/15/2016 CLINICAL DATA:  82 year old male with history of abnormal liver function tests. Possible choledocholithiasis noted on prior CT of the abdomen and pelvis. Followup study. EXAM: MRI ABDOMEN WITHOUT AND WITH CONTRAST (INCLUDING MRCP) TECHNIQUE: Multiplanar multisequence MR imaging of the abdomen was performed both before and after the administration of intravenous contrast. Heavily T2-weighted images of the biliary and pancreatic ducts were obtained, and three-dimensional MRCP images were rendered by post processing. CONTRAST:  30mL MULTIHANCE GADOBENATE DIMEGLUMINE 529 MG/ML IV SOLN COMPARISON:  No prior abdominal MRI. CT the abdomen and pelvis 02/14/2016. FINDINGS: Lower chest: Unremarkable. Hepatobiliary: No discrete cystic or solid hepatic lesions. MRCP images demonstrate no intrahepatic biliary ductal dilatation. Common bile duct measures up to 9 mm in the porta hepatis, which is mildly enlarged, but may be within normal limits for the patient's age. However, MRCP images also demonstrate 2 filling defects within the distal common bile duct, largest of which measures 9 mm, compatible with choledocholithiasis. Gallbladder is moderately distended. Within the dependent portion of the gallbladder there is some amorphous material which is T1 isointense and less T2 hyperintense than normal bile, compatible with biliary sludge. Small signal voids within this also represent gallstones. Gallbladder wall does not appear thickened, however, there is a trace amount of pericholecystic fluid. Pancreas: In the anterior aspect of  the pancreatic head there is a 6 mm T1 hypointense, T2 hyperintense, nonenhancing lesion, likely to represent a tiny pancreatic pseudocyst. No other larger pancreatic mass. No pancreatic ductal dilatation. No pancreatic or peripancreatic fluid or inflammatory changes. Spleen:  Unremarkable. Adrenals/Urinary Tract: Multiple T1 hypointense, T2 hyperintense, nonenhancing lesions associated with both kidneys, compatible with a combination of simple cysts and parapelvic cysts. No hydroureteronephrosis in the visualized portions of the abdomen. Bilateral adrenal glands are normal in appearance. Stomach/Bowel: Several colonic diverticulae are noted in the region of the descending colon. Otherwise, unremarkable. Vascular/Lymphatic: Extensive atherosclerosis in the abdominal vasculature, without aneurysm. No lymphadenopathy noted in the abdomen. Other: No significant volume of ascites. Bilateral perinephric stranding (nonspecific). Musculoskeletal: No aggressive osseous lesions are noted in the visualized portions of the skeleton. IMPRESSION: 1. Study is positive for choledocholithiasis. Common bile duct is mildly dilated measuring 9 mm, however, this may be within normal limits given the patient's advanced age. No intrahepatic biliary ductal dilatation. There is also some biliary sludge and small gallstones lying dependently in the gallbladder. Gallbladder wall does not appear thickened, however, there is a small volume of pericholecystic fluid. Clinical correlation for signs and symptoms of acute cholecystitis is suggested as these findings suspicious but not definitive. 2. Aortic atherosclerosis. 3. Colonic diverticulosis. 4. Additional incidental findings, as above. Electronically Signed   By: Vinnie Langton M.D.   On: 02/15/2016 12:58   Dg Ercp Biliary & Pancreatic Ducts  Result Date: 02/16/2016 CLINICAL DATA:  Cholelithiasis EXAM: ERCP TECHNIQUE: Multiple spot images obtained with the fluoroscopic device and  submitted for interpretation post-procedure. FLUOROSCOPY TIME:  Fluoroscopy Time:  3 minutes and 40 seconds Radiation Exposure Index (if provided by the fluoroscopic device): Number of Acquired Spot Images: 5 COMPARISON:  None. FINDINGS: Contrast fills the biliary tree. Balloon stone retrieval is documented. IMPRESSION: See above. These images were submitted for radiologic interpretation only. Please see the procedural report for the amount of contrast and the fluoroscopy time utilized. Electronically Signed   By: Marybelle Killings M.D.   On: 02/16/2016 13:29   Mr Abdomen Mrcp Moise Boring Contast  Result Date: 02/15/2016 CLINICAL  DATA:  82 year old male with history of abnormal liver function tests. Possible choledocholithiasis noted on prior CT of the abdomen and pelvis. Followup study. EXAM: MRI ABDOMEN WITHOUT AND WITH CONTRAST (INCLUDING MRCP) TECHNIQUE: Multiplanar multisequence MR imaging of the abdomen was performed both before and after the administration of intravenous contrast. Heavily T2-weighted images of the biliary and pancreatic ducts were obtained, and three-dimensional MRCP images were rendered by post processing. CONTRAST:  23mL MULTIHANCE GADOBENATE DIMEGLUMINE 529 MG/ML IV SOLN COMPARISON:  No prior abdominal MRI. CT the abdomen and pelvis 02/14/2016. FINDINGS: Lower chest: Unremarkable. Hepatobiliary: No discrete cystic or solid hepatic lesions. MRCP images demonstrate no intrahepatic biliary ductal dilatation. Common bile duct measures up to 9 mm in the porta hepatis, which is mildly enlarged, but may be within normal limits for the patient's age. However, MRCP images also demonstrate 2 filling defects within the distal common bile duct, largest of which measures 9 mm, compatible with choledocholithiasis. Gallbladder is moderately distended. Within the dependent portion of the gallbladder there is some amorphous material which is T1 isointense and less T2 hyperintense than normal bile, compatible with  biliary sludge. Small signal voids within this also represent gallstones. Gallbladder wall does not appear thickened, however, there is a trace amount of pericholecystic fluid. Pancreas: In the anterior aspect of the pancreatic head there is a 6 mm T1 hypointense, T2 hyperintense, nonenhancing lesion, likely to represent a tiny pancreatic pseudocyst. No other larger pancreatic mass. No pancreatic ductal dilatation. No pancreatic or peripancreatic fluid or inflammatory changes. Spleen:  Unremarkable. Adrenals/Urinary Tract: Multiple T1 hypointense, T2 hyperintense, nonenhancing lesions associated with both kidneys, compatible with a combination of simple cysts and parapelvic cysts. No hydroureteronephrosis in the visualized portions of the abdomen. Bilateral adrenal glands are normal in appearance. Stomach/Bowel: Several colonic diverticulae are noted in the region of the descending colon. Otherwise, unremarkable. Vascular/Lymphatic: Extensive atherosclerosis in the abdominal vasculature, without aneurysm. No lymphadenopathy noted in the abdomen. Other: No significant volume of ascites. Bilateral perinephric stranding (nonspecific). Musculoskeletal: No aggressive osseous lesions are noted in the visualized portions of the skeleton. IMPRESSION: 1. Study is positive for choledocholithiasis. Common bile duct is mildly dilated measuring 9 mm, however, this may be within normal limits given the patient's advanced age. No intrahepatic biliary ductal dilatation. There is also some biliary sludge and small gallstones lying dependently in the gallbladder. Gallbladder wall does not appear thickened, however, there is a small volume of pericholecystic fluid. Clinical correlation for signs and symptoms of acute cholecystitis is suggested as these findings suspicious but not definitive. 2. Aortic atherosclerosis. 3. Colonic diverticulosis. 4. Additional incidental findings, as above. Electronically Signed   By: Vinnie Langton  M.D.   On: 02/15/2016 12:58   US Abdomen Limited Ruq  Result Date: 02/14/2016 CLINICAL DATA:  Right upper quadrant pain for 1 day. EXAM: US ABDOMEN LIMITED - RIGHT UPPER QUADRANT COMPARISON:  CT abdomen and pelvis 02/14/2016 FINDINGS: Gallbladder: The gallbladder wall thickening (or combination of gallbladder wall and trace pericholecystic fluid) measures up to 7 mm in thickness. Small amount of gallbladder sludge. No sonographic Murphy sign noted by sonographer. Common bile duct: Diameter: 8 mm.  Distal portion not well visualized. Liver: Mild intrahepatic biliary dilatation. No focal liver lesion identified, however the left lobe was suboptimally visualized due to bowel gas. IMPRESSION: 1. Small amount of gallbladder sludge with mild gallbladder wall thickening (and/or trace pericholecystic fluid). Acute cholecystitis is not excluded in the appropriate clinical setting, however sonographic Murphy sign was negative. 2.  Mild intrahepatic and borderline extrahepatic biliary dilatation. The distal common bile duct was not well seen. MRCP or ERCP could be considered to further evaluate the possible distal common duct stone or other debris described on CT. Electronically Signed   By: Logan Bores M.D.   On: 02/14/2016 18:06    Lab Data:  CBC:  Recent Labs Lab 02/14/16 1425 02/15/16 0513 02/16/16 0348 02/17/16 0401  WBC 14.2* 14.0* 12.1* 8.6  NEUTROABS  --  12.4* 10.1* 6.5  HGB 15.2 13.4 13.6 12.5*  HCT 44.4 39.2 39.1 36.4*  MCV 93.9 93.8 92.7 92.4  PLT 163 154 131* [id number]*   Basic Metabolic Panel:  Recent Labs Lab 02/14/16 1425 02/15/16 0513 02/16/16 0348 02/17/16 0401  NA 137 137 137 137  K 4.2 4.8 3.6 3.1*  CL 103 106 104 104  CO2 22 22 21* 25  GLUCOSE 179* 131* 118* 90  BUN 22* 16 16 8   CREATININE 1.31* 1.55* 1.54* 1.28*  CALCIUM 9.8 9.0 8.7* 7.9*   GFR: Estimated Creatinine Clearance: 31.8 mL/min (by C-G formula based on SCr of 1.28 mg/dL (H)). Liver Function Tests:  Recent  Labs Lab 02/14/16 1425 02/15/16 0513 02/16/16 0348 02/17/16 0401  AST 356* 1,149* 427* 155*  ALT 156* 823* 555* 328*  ALKPHOS 152* 170* 162* 137*  BILITOT 2.3* 4.3* 4.7* 2.7*  PROT 8.0 6.6 6.8 5.9*  ALBUMIN 3.9 3.0* 3.2* 2.6*    Recent Labs Lab 02/14/16 1425 02/17/16 0401  LIPASE 51 99*   No results for input(s): AMMONIA in the last 168 hours. Coagulation Profile:  Recent Labs Lab 02/15/16 0513  INR 1.20   Cardiac Enzymes: No results for input(s): CKTOTAL, CKMB, CKMBINDEX, TROPONINI in the last 168 hours. BNP (last 3 results) No results for input(s): PROBNP in the last 8760 hours. HbA1C: No results for input(s): HGBA1C in the last 72 hours. CBG:  Recent Labs Lab 02/16/16 0616 02/16/16 1809 02/16/16 2342 02/17/16 0556 02/17/16 1155  GLUCAP 124* 106* 100* 91 95   Lipid Profile: No results for input(s): CHOL, HDL, LDLCALC, TRIG, CHOLHDL, LDLDIRECT in the last 72 hours. Thyroid Function Tests: No results for input(s): TSH, T4TOTAL, FREET4, T3FREE, THYROIDAB in the last 72 hours. Anemia Panel: No results for input(s): VITAMINB12, FOLATE, FERRITIN, TIBC, IRON, RETICCTPCT in the last 72 hours. Urine analysis:    Component Value Date/Time   COLORURINE YELLOW 02/14/2016 1506   APPEARANCEUR CLEAR 02/14/2016 1506   LABSPEC 1.018 02/14/2016 1506   PHURINE 6.5 02/14/2016 1506   GLUCOSEU NEGATIVE 02/14/2016 1506   HGBUR NEGATIVE 02/14/2016 1506   BILIRUBINUR NEGATIVE 02/14/2016 1506   BILIRUBINUR n 07/25/2015 1236   KETONESUR 40 (A) 02/14/2016 1506   PROTEINUR NEGATIVE 02/14/2016 1506   UROBILINOGEN 0.2 07/25/2015 1236   UROBILINOGEN 1.0 12/22/2013 0341   NITRITE NEGATIVE 02/14/2016 1506   LEUKOCYTESUR NEGATIVE 02/14/2016 1506     Tanyika Barros M.D. [facility] 02/17/2016, 12:16 PM  Pager: [phone] Between 7am to 7pm - call Pager - [phone]  After 7pm go to [email or web address] - password [email or web address]  Call night coverage person covering after 7pm

## 2016-02-17 NOTE — Care Management Important Message (Signed)
Important Message  Patient Details  Name: Allen Wheeler MRN: [id number] Date of Birth: [birth year]   Medicare Important Message Given:  Yes    Kialee Kham 02/17/2016, 12:16 PM

## 2016-02-17 NOTE — Progress Notes (Signed)
1 Day Post-Op  Subjective: From our standpoint he looks fine. Complaining of trouble directing his left arm this a.m. He says this started yesterday and is new. I can see no other focal deficits. But he clearly has trouble grasping his cup with the left hand. His son noted this after his ERCP, but he was also sleepy after the ERCP.  Objective: Vital signs in last 24 hours: Temp:  [97.5 F (36.4 C)-98.5 F (36.9 C)] 98.5 F (36.9 C) (11/10 0604) Pulse Rate:  [35-77] 59 (11/10 0604) Resp:  [13-75] 16 (11/10 0604) BP: (108-191)/(58-125) 119/63 (11/10 0604) SpO2:  [94 %-100 %] 96 % (11/10 0604) Last BM Date: 02/15/16 Nothing PO recorded  2700 IV Urine 100 recorded Afebrile vital signs are stable Lipase mildly elevated at 99, LFTs improving Creatinine is improved. WBC 8.6    Intake/Output from previous day: 11/09 0701 - 11/10 0700 In: 2723.8 [I.V.:2473.8; IV Piggyback:250] Out: 100 [Urine:100] Intake/Output this shift: No intake/output data recorded.  General appearance: alert, cooperative and no distress Resp: clear to auscultation bilaterally GI: soft, non-tender; bowel sounds normal; no masses,  no organomegaly Speech is clear, mentation is clear. Strength seems equal in both upper and lower extremities. He has trouble directing his left hand to grasp his cup which is new.  Lab Results:   Recent Labs  02/16/16 0348 02/17/16 0401  WBC 12.1* 8.6  HGB 13.6 12.5*  HCT 39.1 36.4*  PLT 131* 133*    BMET  Recent Labs  02/16/16 0348 02/17/16 0401  NA 137 137  K 3.6 3.1*  CL 104 104  CO2 21* 25  GLUCOSE 118* 90  BUN 16 8  CREATININE 1.54* 1.28*  CALCIUM 8.7* 7.9*   PT/INR  Recent Labs  02/15/16 0513  LABPROT 15.3*  INR 1.20     Recent Labs Lab 02/14/16 1425 02/15/16 0513 02/16/16 0348 02/17/16 0401  AST 356* 1,149* 427* 155*  ALT 156* 823* 555* 328*  ALKPHOS 152* 170* 162* 137*  BILITOT 2.3* 4.3* 4.7* 2.7*  PROT 8.0 6.6 6.8 5.9*  ALBUMIN 3.9  3.0* 3.2* 2.6*     Lipase     Component Value Date/Time   LIPASE 99 (H) 02/17/2016 0401     Studies/Results: Mr 3d Recon At Scanner  Result Date: 02/15/2016 CLINICAL DATA:  82 year old male with history of abnormal liver function tests. Possible choledocholithiasis noted on prior CT of the abdomen and pelvis. Followup study. EXAM: MRI ABDOMEN WITHOUT AND WITH CONTRAST (INCLUDING MRCP) TECHNIQUE: Multiplanar multisequence MR imaging of the abdomen was performed both before and after the administration of intravenous contrast. Heavily T2-weighted images of the biliary and pancreatic ducts were obtained, and three-dimensional MRCP images were rendered by post processing. CONTRAST:  40mL MULTIHANCE GADOBENATE DIMEGLUMINE 529 MG/ML IV SOLN COMPARISON:  No prior abdominal MRI. CT the abdomen and pelvis 02/14/2016. FINDINGS: Lower chest: Unremarkable. Hepatobiliary: No discrete cystic or solid hepatic lesions. MRCP images demonstrate no intrahepatic biliary ductal dilatation. Common bile duct measures up to 9 mm in the porta hepatis, which is mildly enlarged, but may be within normal limits for the patient's age. However, MRCP images also demonstrate 2 filling defects within the distal common bile duct, largest of which measures 9 mm, compatible with choledocholithiasis. Gallbladder is moderately distended. Within the dependent portion of the gallbladder there is some amorphous material which is T1 isointense and less T2 hyperintense than normal bile, compatible with biliary sludge. Small signal voids within this also represent gallstones. Gallbladder wall does  not appear thickened, however, there is a trace amount of pericholecystic fluid. Pancreas: In the anterior aspect of the pancreatic head there is a 6 mm T1 hypointense, T2 hyperintense, nonenhancing lesion, likely to represent a tiny pancreatic pseudocyst. No other larger pancreatic mass. No pancreatic ductal dilatation. No pancreatic or peripancreatic  fluid or inflammatory changes. Spleen:  Unremarkable. Adrenals/Urinary Tract: Multiple T1 hypointense, T2 hyperintense, nonenhancing lesions associated with both kidneys, compatible with a combination of simple cysts and parapelvic cysts. No hydroureteronephrosis in the visualized portions of the abdomen. Bilateral adrenal glands are normal in appearance. Stomach/Bowel: Several colonic diverticulae are noted in the region of the descending colon. Otherwise, unremarkable. Vascular/Lymphatic: Extensive atherosclerosis in the abdominal vasculature, without aneurysm. No lymphadenopathy noted in the abdomen. Other: No significant volume of ascites. Bilateral perinephric stranding (nonspecific). Musculoskeletal: No aggressive osseous lesions are noted in the visualized portions of the skeleton. IMPRESSION: 1. Study is positive for choledocholithiasis. Common bile duct is mildly dilated measuring 9 mm, however, this may be within normal limits given the patient's advanced age. No intrahepatic biliary ductal dilatation. There is also some biliary sludge and small gallstones lying dependently in the gallbladder. Gallbladder wall does not appear thickened, however, there is a small volume of pericholecystic fluid. Clinical correlation for signs and symptoms of acute cholecystitis is suggested as these findings suspicious but not definitive. 2. Aortic atherosclerosis. 3. Colonic diverticulosis. 4. Additional incidental findings, as above. Electronically Signed   By: Vinnie Langton M.D.   On: 02/15/2016 12:58   Dg Ercp Biliary & Pancreatic Ducts  Result Date: 02/16/2016 CLINICAL DATA:  Cholelithiasis EXAM: ERCP TECHNIQUE: Multiple spot images obtained with the fluoroscopic device and submitted for interpretation post-procedure. FLUOROSCOPY TIME:  Fluoroscopy Time:  3 minutes and 40 seconds Radiation Exposure Index (if provided by the fluoroscopic device): Number of Acquired Spot Images: 5 COMPARISON:  None. FINDINGS:  Contrast fills the biliary tree. Balloon stone retrieval is documented. IMPRESSION: See above. These images were submitted for radiologic interpretation only. Please see the procedural report for the amount of contrast and the fluoroscopy time utilized. Electronically Signed   By: Marybelle Killings M.D.   On: 02/16/2016 13:29   Mr Abdomen Mrcp Moise Boring Contast  Result Date: 02/15/2016 CLINICAL DATA:  82 year old male with history of abnormal liver function tests. Possible choledocholithiasis noted on prior CT of the abdomen and pelvis. Followup study. EXAM: MRI ABDOMEN WITHOUT AND WITH CONTRAST (INCLUDING MRCP) TECHNIQUE: Multiplanar multisequence MR imaging of the abdomen was performed both before and after the administration of intravenous contrast. Heavily T2-weighted images of the biliary and pancreatic ducts were obtained, and three-dimensional MRCP images were rendered by post processing. CONTRAST:  75mL MULTIHANCE GADOBENATE DIMEGLUMINE 529 MG/ML IV SOLN COMPARISON:  No prior abdominal MRI. CT the abdomen and pelvis 02/14/2016. FINDINGS: Lower chest: Unremarkable. Hepatobiliary: No discrete cystic or solid hepatic lesions. MRCP images demonstrate no intrahepatic biliary ductal dilatation. Common bile duct measures up to 9 mm in the porta hepatis, which is mildly enlarged, but may be within normal limits for the patient's age. However, MRCP images also demonstrate 2 filling defects within the distal common bile duct, largest of which measures 9 mm, compatible with choledocholithiasis. Gallbladder is moderately distended. Within the dependent portion of the gallbladder there is some amorphous material which is T1 isointense and less T2 hyperintense than normal bile, compatible with biliary sludge. Small signal voids within this also represent gallstones. Gallbladder wall does not appear thickened, however, there is a trace amount of  pericholecystic fluid. Pancreas: In the anterior aspect of the pancreatic head there  is a 6 mm T1 hypointense, T2 hyperintense, nonenhancing lesion, likely to represent a tiny pancreatic pseudocyst. No other larger pancreatic mass. No pancreatic ductal dilatation. No pancreatic or peripancreatic fluid or inflammatory changes. Spleen:  Unremarkable. Adrenals/Urinary Tract: Multiple T1 hypointense, T2 hyperintense, nonenhancing lesions associated with both kidneys, compatible with a combination of simple cysts and parapelvic cysts. No hydroureteronephrosis in the visualized portions of the abdomen. Bilateral adrenal glands are normal in appearance. Stomach/Bowel: Several colonic diverticulae are noted in the region of the descending colon. Otherwise, unremarkable. Vascular/Lymphatic: Extensive atherosclerosis in the abdominal vasculature, without aneurysm. No lymphadenopathy noted in the abdomen. Other: No significant volume of ascites. Bilateral perinephric stranding (nonspecific). Musculoskeletal: No aggressive osseous lesions are noted in the visualized portions of the skeleton. IMPRESSION: 1. Study is positive for choledocholithiasis. Common bile duct is mildly dilated measuring 9 mm, however, this may be within normal limits given the patient's advanced age. No intrahepatic biliary ductal dilatation. There is also some biliary sludge and small gallstones lying dependently in the gallbladder. Gallbladder wall does not appear thickened, however, there is a small volume of pericholecystic fluid. Clinical correlation for signs and symptoms of acute cholecystitis is suggested as these findings suspicious but not definitive. 2. Aortic atherosclerosis. 3. Colonic diverticulosis. 4. Additional incidental findings, as above. Electronically Signed   By: Vinnie Langton M.D.   On: 02/15/2016 12:58    Medications: .  ceFAZolin (ANCEF) IV  2 g Intravenous To SSTC  . citalopram  20 mg Oral Daily  . famotidine (PEPCID) IV  20 mg Intravenous Q12H  . gabapentin  100 mg Oral QHS  . heparin  5,000 Units  Subcutaneous Q8H  . levothyroxine  25 mcg Intravenous Daily  . piperacillin-tazobactam (ZOSYN)  IV  3.375 g Intravenous Q8H  . sodium chloride flush  3 mL Intravenous Q12H    Assessment/Plan Cholelithiasis with choledocholithiasis. S/p ERCP/sphincterotomy 02/16/16, Dr. Teena Irani Mild renal insuffiencey - ran a 1.54 FEN:  NPO/IV fluids ID: Zosyn day 4 Ancef also added pre op DVT:  Heparin    Plan: We will discuss and review with Dr. Tana Coast.  Will discuss with Dr. Brantley Stage also.  MRI negative, will plan on surgery tomorrow.  LOS: 3 days    Lady Wisham 02/17/2016 [phone]

## 2016-02-17 NOTE — Progress Notes (Signed)
Pharmacy Antibiotic Note  Allen Wheeler is a 82 y.o. male admitted on 02/14/2016 with abdominal pain.  Pharmacy has been consulted for Zosyn dosing for intra-abdominal infection. Had ERCP on 11/9 with stone removal. Possible lap chole today.   Plan: Continue Zosyn 3.375 gm IV q8hrs (each over 4 hours) Consider stopping after post-op prophylaxis if has surgery    Height: 5\' 6"  (167.6 cm) Weight: 145 lb (65.8 kg) IBW/kg (Calculated) : 63.8  Temp (24hrs), Avg:98 F (36.7 C), Min:97.5 F (36.4 C), Max:98.5 F (36.9 C)   Recent Labs Lab 02/14/16 1425 02/15/16 0513 02/16/16 0348 02/17/16 0401  WBC 14.2* 14.0* 12.1* 8.6  CREATININE 1.31* 1.55* 1.54* 1.28*    Estimated Creatinine Clearance: 31.8 mL/min (by C-G formula based on SCr of 1.28 mg/dL (H)).    No Known Allergies  Antimicrobials this admission:  Zosyn 11/7>>  Dose adjustments this admission:   Microbiology results: 11/9 MRSA PCR neg  Thank you for allowing pharmacy to be a part of this patient's care.  Renold Genta, PharmD, BCPS Clinical Pharmacist Phone for today - [place] - [phone] 02/17/2016 11:18 AM

## 2016-02-18 ENCOUNTER — Inpatient Hospital Stay (HOSPITAL_COMMUNITY): Payer: Medicare Other | Admitting: Anesthesiology

## 2016-02-18 ENCOUNTER — Encounter (HOSPITAL_COMMUNITY)
Admission: EM | Disposition: A | Discharge status: Home Health | Payer: Self-pay | Source: Home / Self Care | Attending: Internal Medicine

## 2016-02-18 HISTORY — PX: CHOLECYSTECTOMY: SHX55

## 2016-02-18 LAB — LIPASE, BLOOD: Lipase: 38 U/L (ref 11–51)

## 2016-02-18 LAB — COMPREHENSIVE METABOLIC PANEL
ALBUMIN: 2.8 g/dL — AB (ref 3.5–5.0)
ALT: 276 U/L — ABNORMAL HIGH (ref 17–63)
ANION GAP: 10 (ref 5–15)
AST: 135 U/L — ABNORMAL HIGH (ref 15–41)
Alkaline Phosphatase: 163 U/L — ABNORMAL HIGH (ref 38–126)
BILIRUBIN TOTAL: 3.5 mg/dL — AB (ref 0.3–1.2)
BUN: 8 mg/dL (ref 6–20)
CO2: 19 mmol/L — AB (ref 22–32)
Calcium: 8.2 mg/dL — ABNORMAL LOW (ref 8.9–10.3)
Chloride: 104 mmol/L (ref 101–111)
Creatinine, Ser: 1.02 mg/dL (ref 0.61–1.24)
GFR calc non Af Amer: >60 mL/min (ref >60)
GLUCOSE: 114 mg/dL — AB (ref 65–99)
POTASSIUM: 6.2 mmol/L — AB (ref 3.5–5.1)
SODIUM: 133 mmol/L — AB (ref 135–145)
TOTAL PROTEIN: 6.6 g/dL (ref 6.5–8.1)

## 2016-02-18 LAB — GLUCOSE, CAPILLARY
GLUCOSE-CAPILLARY: 101 mg/dL — AB (ref 65–99)
GLUCOSE-CAPILLARY: 121 mg/dL — AB (ref 65–99)
GLUCOSE-CAPILLARY: 153 mg/dL — AB (ref 65–99)
GLUCOSE-CAPILLARY: 92 mg/dL (ref 65–99)
Glucose-Capillary: 106 mg/dL — ABNORMAL HIGH (ref 65–99)

## 2016-02-18 SURGERY — LAPAROSCOPIC CHOLECYSTECTOMY WITH INTRAOPERATIVE CHOLANGIOGRAM
Anesthesia: General | Site: Abdomen

## 2016-02-18 MED ORDER — PANTOPRAZOLE SODIUM 40 MG PO TBEC
40.0000 mg | DELAYED_RELEASE_TABLET | Freq: Every day | ORAL | Status: DC
  Administered 2016-02-19 – 2016-02-20 (×2): 40 mg via ORAL
  Filled 2016-02-18 (×2): qty 1

## 2016-02-18 MED ORDER — FENTANYL CITRATE (PF) 100 MCG/2ML IJ SOLN
25.0000 ug | INTRAMUSCULAR | Status: DC | PRN
  Administered 2016-02-18: 50 ug via INTRAVENOUS

## 2016-02-18 MED ORDER — LORAZEPAM 0.5 MG PO TABS
0.5000 mg | ORAL_TABLET | Freq: Every day | ORAL | Status: DC
  Administered 2016-02-18 – 2016-02-19 (×2): 0.5 mg via ORAL
  Filled 2016-02-18 (×2): qty 1

## 2016-02-18 MED ORDER — SUGAMMADEX SODIUM 200 MG/2ML IV SOLN
INTRAVENOUS | Status: DC | PRN
  Administered 2016-02-18: 100 mg via INTRAVENOUS

## 2016-02-18 MED ORDER — OXYCODONE HCL 5 MG PO TABS
5.0000 mg | ORAL_TABLET | Freq: Four times a day (QID) | ORAL | Status: DC | PRN
  Administered 2016-02-18 – 2016-02-19 (×2): 5 mg via ORAL
  Filled 2016-02-18 (×2): qty 1

## 2016-02-18 MED ORDER — FAMOTIDINE IN NACL 20-0.9 MG/50ML-% IV SOLN
20.0000 mg | Freq: Two times a day (BID) | INTRAVENOUS | Status: AC
  Administered 2016-02-18: 20 mg via INTRAVENOUS
  Filled 2016-02-18: qty 50

## 2016-02-18 MED ORDER — SIMETHICONE 80 MG PO CHEW
80.0000 mg | CHEWABLE_TABLET | Freq: Four times a day (QID) | ORAL | Status: DC | PRN
  Administered 2016-02-18 – 2016-02-19 (×3): 80 mg via ORAL
  Filled 2016-02-18 (×3): qty 1

## 2016-02-18 MED ORDER — FENTANYL CITRATE (PF) 100 MCG/2ML IJ SOLN
INTRAMUSCULAR | Status: DC | PRN
  Administered 2016-02-18 (×2): 50 ug via INTRAVENOUS

## 2016-02-18 MED ORDER — 0.9 % SODIUM CHLORIDE (POUR BTL) OPTIME
TOPICAL | Status: DC | PRN
  Administered 2016-02-18: 1000 mL

## 2016-02-18 MED ORDER — ONDANSETRON HCL 4 MG/2ML IJ SOLN: 4.0000 mg | Freq: Once | INTRAMUSCULAR | Status: DC | PRN

## 2016-02-18 MED ORDER — FENTANYL CITRATE (PF) 100 MCG/2ML IJ SOLN
INTRAMUSCULAR | Status: AC
  Filled 2016-02-18: qty 2

## 2016-02-18 MED ORDER — ROCURONIUM BROMIDE 100 MG/10ML IV SOLN
INTRAVENOUS | Status: DC | PRN
  Administered 2016-02-18: 40 mg via INTRAVENOUS

## 2016-02-18 MED ORDER — PHENYLEPHRINE HCL 10 MG/ML IJ SOLN
INTRAMUSCULAR | Status: DC | PRN
  Administered 2016-02-18: 10 ug/min via INTRAVENOUS

## 2016-02-18 MED ORDER — SUGAMMADEX SODIUM 200 MG/2ML IV SOLN
INTRAVENOUS | Status: AC
  Filled 2016-02-18: qty 2

## 2016-02-18 MED ORDER — LIDOCAINE HCL (CARDIAC) 20 MG/ML IV SOLN
INTRAVENOUS | Status: DC | PRN
  Administered 2016-02-18: 80 mg via INTRAVENOUS

## 2016-02-18 MED ORDER — SODIUM CHLORIDE 0.9 % IR SOLN
Status: DC | PRN
  Administered 2016-02-18: 1000 mL

## 2016-02-18 MED ORDER — ARTIFICIAL TEARS OP OINT
TOPICAL_OINTMENT | OPHTHALMIC | Status: DC | PRN
  Administered 2016-02-18: 1 via OPHTHALMIC

## 2016-02-18 MED ORDER — BUPIVACAINE HCL (PF) 0.25 % IJ SOLN
INTRAMUSCULAR | Status: AC
  Filled 2016-02-18: qty 30

## 2016-02-18 MED ORDER — PROPOFOL 10 MG/ML IV BOLUS
INTRAVENOUS | Status: DC | PRN
Start: 2016-02-18 — End: 2016-02-18
  Administered 2016-02-18: 100 mg via INTRAVENOUS

## 2016-02-18 MED ORDER — BUPIVACAINE HCL 0.25 % IJ SOLN
INTRAMUSCULAR | Status: DC | PRN
  Administered 2016-02-18: 10 mL

## 2016-02-18 MED ORDER — LEVOTHYROXINE SODIUM 50 MCG PO TABS
50.0000 ug | ORAL_TABLET | Freq: Every day | ORAL | Status: DC
  Administered 2016-02-19 – 2016-02-20 (×2): 50 ug via ORAL
  Filled 2016-02-18 (×2): qty 1

## 2016-02-18 MED ORDER — LACTATED RINGERS IV SOLN
INTRAVENOUS | Status: DC | PRN
  Administered 2016-02-18: 07:00:00 via INTRAVENOUS

## 2016-02-18 SURGICAL SUPPLY — 42 items
ADH SKN CLS APL DERMABOND .7 (GAUZE/BANDAGES/DRESSINGS) ×1
APPLIER CLIP ROT 10 11.4 M/L (STAPLE) ×3
APR CLP MED LRG 11.4X10 (STAPLE) ×1
BAG SPEC RTRVL 10 TROC 200 (ENDOMECHANICALS) ×1
BLADE SURG ROTATE 9660 (MISCELLANEOUS) ×2 IMPLANT
CANISTER SUCTION 2500CC (MISCELLANEOUS) ×3 IMPLANT
CATH CHOLANG 76X19 KUMAR (CATHETERS) ×3 IMPLANT
CHLORAPREP W/TINT 26ML (MISCELLANEOUS) ×3 IMPLANT
CLIP APPLIE ROT 10 11.4 M/L (STAPLE) IMPLANT
CLIP LIGATING HEMO LOK XL GOLD (MISCELLANEOUS) ×5 IMPLANT
CLIP LIGATING HEMO O LOK GREEN (MISCELLANEOUS) IMPLANT
COVER MAYO STAND STRL (DRAPES) ×3 IMPLANT
COVER SURGICAL LIGHT HANDLE (MISCELLANEOUS) ×3 IMPLANT
DERMABOND ADVANCED (GAUZE/BANDAGES/DRESSINGS) ×2
DERMABOND ADVANCED .7 DNX12 (GAUZE/BANDAGES/DRESSINGS) ×1 IMPLANT
DRAPE C-ARM 42X72 X-RAY (DRAPES) ×3 IMPLANT
ELECT REM PT RETURN 9FT ADLT (ELECTROSURGICAL) ×3
ELECTRODE REM PT RTRN 9FT ADLT (ELECTROSURGICAL) ×1 IMPLANT
GLOVE BIOGEL PI IND STRL 7.0 (GLOVE) ×1 IMPLANT
GLOVE BIOGEL PI INDICATOR 7.0 (GLOVE) ×2
GLOVE SURG SS PI 7.0 STRL IVOR (GLOVE) ×3 IMPLANT
GOWN STRL REUS W/ TWL LRG LVL3 (GOWN DISPOSABLE) ×3 IMPLANT
GOWN STRL REUS W/TWL LRG LVL3 (GOWN DISPOSABLE) ×9
GRASPER SUT TROCAR 14GX15 (MISCELLANEOUS) ×3 IMPLANT
KIT BASIN OR (CUSTOM PROCEDURE TRAY) ×3 IMPLANT
KIT ROOM TURNOVER OR (KITS) ×3 IMPLANT
NS IRRIG 1000ML POUR BTL (IV SOLUTION) ×3 IMPLANT
PAD ARMBOARD 7.5X6 YLW CONV (MISCELLANEOUS) ×3 IMPLANT
POUCH RETRIEVAL ECOSAC 10 (ENDOMECHANICALS) ×1 IMPLANT
POUCH RETRIEVAL ECOSAC 10MM (ENDOMECHANICALS) ×2
SCISSORS LAP 5X35 DISP (ENDOMECHANICALS) ×3 IMPLANT
SET IRRIG TUBING LAPAROSCOPIC (IRRIGATION / IRRIGATOR) ×3 IMPLANT
SLEEVE ENDOPATH XCEL 5M (ENDOMECHANICALS) ×6 IMPLANT
SPECIMEN JAR SMALL (MISCELLANEOUS) ×3 IMPLANT
STOPCOCK 4 WAY LG BORE MALE ST (IV SETS) ×3 IMPLANT
SUT MNCRL AB 4-0 PS2 18 (SUTURE) ×3 IMPLANT
TOWEL OR 17X24 6PK STRL BLUE (TOWEL DISPOSABLE) ×3 IMPLANT
TOWEL OR 17X26 10 PK STRL BLUE (TOWEL DISPOSABLE) ×3 IMPLANT
TRAY LAPAROSCOPIC MC (CUSTOM PROCEDURE TRAY) ×3 IMPLANT
TROCAR XCEL 12X100 BLDLESS (ENDOMECHANICALS) ×3 IMPLANT
TROCAR XCEL NON-BLD 5MMX100MML (ENDOMECHANICALS) ×3 IMPLANT
TUBING INSUFFLATION (TUBING) ×3 IMPLANT

## 2016-02-18 NOTE — Op Note (Signed)
PATIENT:  Allen Wheeler  82 y.o. male  PRE-OPERATIVE DIAGNOSIS:  gallstones  POST-OPERATIVE DIAGNOSIS:  gallstones  PROCEDURE:  Procedure(s): LAPAROSCOPIC CHOLECYSTECTOMY    SURGEON:  Surgeon(s): [name] Kinsinger, MD  ASSISTANT: none  ANESTHESIA:   local and general  Indications for procedure: Allen Wheeler is a 82 y.o. male with symptoms of Abdominal pain consistent with gallbladder disease, Confirmed by CT.  Description of procedure: The patient was brought into the operative suite, placed supine. Anesthesia was administered with endotracheal tube. Patient was strapped in place and foot board was secured. All pressure points were offloaded by foam padding. The patient was prepped and draped in the usual sterile fashion.  A small incision was made to the right of the umbilicus. A 57mm trocar was inserted into the peritoneal cavity with optical entry. Pneumoperitoneum was applied with high flow low pressure. 2 7mm trocars were placed in the RUQ. A 70mm trocar was placed in the subxiphoid space. All trocars sites were first anesthesized with 0.25% marcaine with epinephrine in the subcutaneous and preperitoneal layers. Next the patient was placed in reverse trendelenberg. There were some adhesions of the omentum to the abdominal wall, these were taken down with cautery.  The gallbladder was retracted cephalad and lateral. The peritoneum was reflected off the infundibulum working lateral to medial. The cystic duct and cystic artery were identified and further dissection revealed a critical view. The cystic duct and cystic artery were doubly clipped and ligated.   The gallbladder was removed off the liver bed with cautery. The Gallbladder was placed in a specimen bag. The gallbladder fossa was irrigated and hemostasis was applied with cautery. The gallbladder was removed via the 38mm trocar. The fascial defect was closed with interrupted 0 vicryl suture via laparoscopic trans-fascial suture  passer. Pneumoperitoneum was removed, all trocar were removed. All incisions were closed with 4-0 monocryl subcuticular stitch. The patient woke from anesthesia and was brought to PACU in stable condition. All counts were correct  Findings: inflamed gallbladder  Specimen: gallbladder  Blood loss: Total I/O In: 500 [I.V.:500] Out: 20 [Blood:20] ml  Local anesthesia: 69ml [id number] marcaine  Complications: none  PLAN OF CARE: Admit to inpatient   PATIENT DISPOSITION:  PACU - hemodynamically stable.  Gurney Maxin, M.D. General, Bariatric, & Minimally Invasive Surgery [facility]

## 2016-02-18 NOTE — Anesthesia Preprocedure Evaluation (Signed)
Anesthesia Evaluation  Patient identified by MRN, date of birth, ID band Patient awake    Reviewed: Allergy & Precautions, H&P , NPO status , Patient's Chart, lab work & pertinent test results  History of Anesthesia Complications Negative for: history of anesthetic complications  Airway Mallampati: III  TM Distance: >3 FB Neck ROM: Full    Dental no notable dental hx. (+) Teeth Intact, Dental Advisory Given   Pulmonary neg pulmonary ROS, former smoker,    Pulmonary exam normal breath sounds clear to auscultation       Cardiovascular negative cardio ROS   Rhythm:Regular Rate:Normal     Neuro/Psych PSYCHIATRIC DISORDERS Anxiety Depression negative neurological ROS     GI/Hepatic Neg liver ROS, GERD  Medicated and Controlled,  Endo/Other  Hypothyroidism   Renal/GU Renal InsufficiencyRenal disease  negative genitourinary   Musculoskeletal  (+) Arthritis , Osteoarthritis,    Abdominal   Peds  Hematology  (+) Blood dyscrasia (thrombocytopenia), anemia ,   Anesthesia Other Findings   Reproductive/Obstetrics negative OB ROS                             Anesthesia Physical  Anesthesia Plan  ASA: III  Anesthesia Plan: General   Post-op Pain Management:    Induction: Intravenous  Airway Management Planned: Oral ETT  Additional Equipment:   Intra-op Plan:   Post-operative Plan: Extubation in OR  Informed Consent: I have reviewed the patients History and Physical, chart, labs and discussed the procedure including the risks, benefits and alternatives for the proposed anesthesia with the patient or authorized representative who has indicated his/her understanding and acceptance.   Dental advisory given  Plan Discussed with: CRNA  Anesthesia Plan Comments: (Risks/benefits of general anesthesia discussed with patient including risk of damage to teeth, lips, gum, and tongue,  nausea/vomiting, allergic reactions to medications, and the possibility of heart attack, stroke and death.  All patient questions answered.  Patient wishes to proceed.)        Anesthesia Quick Evaluation

## 2016-02-18 NOTE — Transfer of Care (Signed)
Immediate Anesthesia Transfer of Care Note  Patient: Allen Wheeler  Procedure(s) Performed: Procedure(s): LAPAROSCOPIC CHOLECYSTECTOMY WITH POSSIBLE INTRAOPERATIVE CHOLANGIOGRAM (N/A) 2Patient Location: PACU  Anesthesia Type:General  Level of Consciousness: awake, alert , oriented and patient cooperative  Airway & Oxygen Therapy: Patient Spontanous Breathing and Patient connected to nasal cannula oxygen  Post-op Assessment: Report given to RN, Post -op Vital signs reviewed and stable, Patient moving all extremities and Patient moving all extremities X 4  Post vital signs: Reviewed and stable  Last Vitals:  Vitals:   02/17/16 2219 02/18/16 0627  BP: 128/71 132/74  Pulse: 61 64  Resp: 17 17  Temp: 37.2 C 36.8 C    Last Pain:  Vitals:   02/18/16 0627  TempSrc: Oral  PainSc:          Complications: No apparent anesthesia complications

## 2016-02-18 NOTE — Progress Notes (Signed)
[facility]                                                                              Patient Demographics  Allen Wheeler, is a 82 y.o. male, DOB - 03/07/1920, [id number]  Admit date - 02/14/2016   Admitting Physician Reubin Milan, MD  Outpatient Primary MD for the patient is [name], MD  Outpatient specialists:   LOS - 4  days    Chief Complaint  Patient presents with  . Abdominal Pain    pt after eating some cereal having lower mid abdominal pain        Brief summary   HPI: Allen Wheeler is a 82 y.o. male with medical history significant of seasonal allergies, anxiety, depression, GERD, nephrolithiasis, osteoarthritis Who presented with one-day history of right upper quadrant abdominal pain. Patient also reported persistent nausea and dry heaving after eating breakfast on the morning of admission. He had a similar attack 2 months ago with negative workup. Workup showed WBC of 14.2, hepatic panel function with AST of 356, ALT of 156, alkaline phosphatase 152 units. His total bilirubin of 2.3 mg/dL. CT abdomen and pelvis showed gallstones with mild intra-and extrahepatic biliary dilatation with possible stone in distal CBD at the head of pancreas. Patient was admitted to further workup   Assessment & Plan    Principal Problem:  Cholelithiasis with acute cholangitis with biliary obstruction, Sepsis ([place]) - LFTs trending up with bilirubin trending up to 4.3, continue IV Zosyn, IV fluids, pain control -MRCP Showed choledocholithiasis, CBD mildly dilated measuring 9 mm, no intrahepatic duct dilatation, some Bentley sludge and small gallstones lying dependently in the gallbladder.  - ERCP done today showed choledocholithiasis, complete removal was accomplished with biliary sphincterotomy and balloon extraction - Gen. surgery following, lap cholecystectomy done today, seen in PACU,  will add incentive spirometry  Left Hand weakness Due to  radial nerve palsy, patient has a history of osteoarthritis - MRI of the brain done negative for acute CVA - Neurology was consulted, recommended outpatient EMG    Anxiety, depression - Continue Celexa 20 mg by mouth daily.    GERD Famotidine 20 mg IVP    Hypothyroidism Change to IV Synthroid 25 MCG daily  Code Status: Full CODE STATUS  DVT Prophylaxis:  heparin  Family Communication: Discussed in detail with the patient, all imaging results, lab results explained to the patient   Disposition Plan: Hopefully DC home in a.m.  Time Spent in minutes  25 minutes  Procedures:  CT abd  Consultants:   GI surgery  Antimicrobials:   zosyn   Medications  Scheduled Meds: . citalopram  20 mg Oral Daily  . famotidine (PEPCID) IV  20 mg Intravenous Q12H  . gabapentin  100 mg Oral QHS  . levothyroxine  25 mcg Intravenous Daily  . sodium chloride flush  3 mL Intravenous Q12H   Continuous Infusions: . sodium chloride 75 mL/hr at 02/18/16 0042   PRN Meds:.HYDROmorphone (DILAUDID) injection, ondansetron **OR** ondansetron (ZOFRAN) IV, oxyCODONE, simethicone   Antibiotics   Anti-infectives    Start     Dose/Rate Route Frequency Ordered Stop  02/18/16 0600  ceFAZolin (ANCEF) IVPB 2g/100 mL premix  Status:  Discontinued     2 g 200 mL/hr over 30 Minutes Intravenous To Short Stay 02/17/16 1459 02/18/16 1037   02/17/16 0600  ceFAZolin (ANCEF) IVPB 2g/100 mL premix  Status:  Discontinued     2 g 200 mL/hr over 30 Minutes Intravenous To Short Stay 02/16/16 1606 02/17/16 1459   02/14/16 2100  piperacillin-tazobactam (ZOSYN) IVPB 3.375 g  Status:  Discontinued     3.375 g 12.5 mL/hr over 240 Minutes Intravenous Every 8 hours 02/14/16 2051 02/18/16 1049        Subjective:   Allen Wheeler was seen and examined today. Patient seen in PACU doing very well,. States having little pain on the right side and difficulty catching breath on the right side due to pain. Patient  denies dizziness, nausea or vomiting.     Objective:   Vitals:   02/18/16 0854 02/18/16 0909 02/18/16 0932 02/18/16 1000  BP: (!) 153/72 (!) 161/71 (!) 163/78 (!) 162/69  Pulse: 71 67 (!) 56 (!) 59  Resp: 10 (!) 21 13 20   Temp: 97.6 F (36.4 C)  98.2 F (36.8 C) 98 F (36.7 C)  TempSrc:      SpO2: 95% 98% 99% 100%  Weight:      Height:        Intake/Output Summary (Last 24 hours) at 02/18/16 1146 Last data filed at 02/18/16 1000  Gross per 24 hour  Intake          2963.75 ml  Output             1495 ml  Net          1468.75 ml     Wt Readings from Last 3 Encounters:  02/14/16 65.8 kg (145 lb)  12/13/15 70.8 kg (156 lb)  07/25/15 75.3 kg (166 lb)     Exam  General: Alert and oriented x 3, NAD  HEENT:    Neck:   Cardiovascular: S1 S2 clear, RRR  Respiratory: CTAB  Gastrointestinal: Soft, incision clean, nondistended, + bowel sounds  Ext: no cyanosis clubbing or edema  Neuro: weakness in the left hand+, otherwise, 5/5 in right upper and lower extremity, LLE  Skin: No rashes  Psych: Normal affect and demeanor, alert and oriented x3    Data Reviewed:  I have personally reviewed following labs and imaging studies  Micro Results Recent Results (from the past 240 hour(s))  Surgical pcr screen     Status: None   Collection Time: 02/16/16  8:06 AM  Result Value Ref Range Status   MRSA, PCR NEGATIVE NEGATIVE Final   Staphylococcus aureus NEGATIVE NEGATIVE Final    Comment:        The Xpert SA Assay (FDA approved for NASAL specimens in patients over 36 years of age), is one component of a comprehensive surveillance program.  Test performance has been validated by [facility] for patients greater than or equal to 28 year old. It is not intended to diagnose infection nor to guide or monitor treatment.     Radiology Reports Dg Chest 2 View  Result Date: 02/14/2016 CLINICAL DATA:  Epigastric abdominal pain beginning this morning. Nausea. Former  smoker. EXAM: CHEST  2 VIEW COMPARISON:  12/22/2013 FINDINGS: The patient is mildly rotated to the right. Cardiomediastinal silhouette is unchanged within this limitation. Normal heart size. There is mild interstitial coarsening which has mildly increased from the prior study. Mild right basilar opacity is  present. A 9 mm nodule projects in the left lung base. No pleural effusion or pneumothorax is seen. No acute osseous abnormality is identified. IMPRESSION: 1. Mild bronchitic changes. 2. Mild right basilar opacity, likely atelectasis. 3. Left lung base nodule. See CT abdomen and pelvis report from today. Electronically Signed   By: Logan Bores M.D.   On: 02/14/2016 17:14   Mr Brain Wo Contrast  Result Date: 02/17/2016 CLINICAL DATA:  Left hand weakness noted after ERCP yesterday. EXAM: MRI HEAD WITHOUT CONTRAST TECHNIQUE: Multiplanar, multiecho pulse sequences of the brain and surrounding structures were obtained without intravenous contrast. COMPARISON:  None. FINDINGS: Brain: No acute infarction, hemorrhage, hydrocephalus, extra-axial collection or mass lesion. Generalized cortical atrophy in keeping with age. No unexpected small-vessel ischemic change. Small calcification on the falx at the vertex, incidental and without mass effect. Vascular: Normal flow voids. Skull and upper cervical spine: Degenerative changes in the upper cervical spine with right atlanto occipital joint effusion. No focal marrow lesion. Sinuses/Orbits: Bilateral cataract resection.  No acute finding. IMPRESSION: Senescent changes without acute finding, including infarct. Electronically Signed   By: Monte Fantasia M.D.   On: 02/17/2016 14:07   Ct Abdomen Pelvis W Contrast  Addendum Date: 02/14/2016   ADDENDUM REPORT: 02/14/2016 17:46 ADDENDUM: Bilateral right greater than left fat filled inguinal hernias Electronically Signed   By: Donavan Foil M.D.   On: 02/14/2016 17:46   Result Date: 02/14/2016 CLINICAL DATA:  Upper  abdominal pain after eating cereal.  Nausea EXAM: CT ABDOMEN AND PELVIS WITH CONTRAST TECHNIQUE: Multidetector CT imaging of the abdomen and pelvis was performed using the standard protocol following bolus administration of intravenous contrast. CONTRAST:  64mL ISOVUE-300 IOPAMIDOL (ISOVUE-300) INJECTION 61% COMPARISON:  None. FINDINGS: Lower chest: There is patchy dependent atelectasis. Mild right middle lobe and lingular atelectasis or scarring. No pleural effusions. There are multiple pulmonary nodules visualized at the lung bases. There it is a a 3 mm left upper lobe lateral lung nodule, series 205, image number 3. There is a a 4 mm nodule along the left fissure, series 205, image number 7. There is a 8 mm pulmonary nodule within the superior portion of left lower lobe, series 205, image number 1. There is additional 3 mm left subpleural posterior nodule, series 205, image number 9. There is a a 6 mm subpleural right lower lobe pulmonary nodule series 205, image number 5. Mild cardiomegaly. Coronary artery calcifications. Aortic valvular calcifications. Hepatobiliary: Mild intra hepatic biliary dilatation. Multiple calcified granuloma. The gallbladder is slightly enlarged. There are multiple small stones within the gallbladder. There is mild enlargement of the extrahepatic common bile duct, measuring up to 9 mm. There is high density material or possible stones within the distal common bile duct at the head of the pancreas, series 201, image number 40 and coronal series 203, image number 68. Pancreas: Unremarkable. No pancreatic ductal dilatation or surrounding inflammatory changes. Spleen: Multiple calcified splenic granuloma. Spleen otherwise unremarkable. Adrenals/Urinary Tract: Adrenal glands are within normal limits. There are multiple hypodense lesions within the kidneys, many of which are too small to further characterize but favor cysts. The bladder is unremarkable. Stomach/Bowel: No dilated large or  small bowel. Stomach grossly unremarkable. Appendix visualized and within normal limits. Vascular/Lymphatic: Ectatic abdominal aorta with mural thrombus and atherosclerosis. There is ectasia of the iliac arteries. No significantly enlarged pelvic or abdominal lymph nodes. Reproductive: Prostate gland is enlarged with calcifications. Other: No free air or free fluid Musculoskeletal: Degenerative changes of the spine.  No suspicious osseous lesions. IMPRESSION: 1. Slightly enlarged gallbladder which contains small calcified stones. There is mild intra and extrahepatic biliary dilatation with small amount of high density material or possible stone within the distal common bile duct at the head of the pancreas. Recommend correlation with laboratory values, if obstruction is suggested, further evaluation with MR or ERCP may be obtained. 2. Multiple calcified granulomas within the liver and spleen. 3. Multiple pulmonary nodules within the bilateral lung bases. No follow-up needed if patient is low-risk (and has no known or suspected primary neoplasm). Non-contrast chest CT can be considered in 12 months if patient is high-risk. This recommendation follows the consensus statement: Guidelines for Management of Incidental Pulmonary Nodules Detected on CT Images: From the Fleischner Society 2017; Radiology 2017; 284:228-243. Electronically Signed: By: Donavan Foil M.D. On: 02/14/2016 17:10   Mr 3d Recon At Scanner  Result Date: 02/15/2016 CLINICAL DATA:  82 year old male with history of abnormal liver function tests. Possible choledocholithiasis noted on prior CT of the abdomen and pelvis. Followup study. EXAM: MRI ABDOMEN WITHOUT AND WITH CONTRAST (INCLUDING MRCP) TECHNIQUE: Multiplanar multisequence MR imaging of the abdomen was performed both before and after the administration of intravenous contrast. Heavily T2-weighted images of the biliary and pancreatic ducts were obtained, and three-dimensional MRCP images were  rendered by post processing. CONTRAST:  80mL MULTIHANCE GADOBENATE DIMEGLUMINE 529 MG/ML IV SOLN COMPARISON:  No prior abdominal MRI. CT the abdomen and pelvis 02/14/2016. FINDINGS: Lower chest: Unremarkable. Hepatobiliary: No discrete cystic or solid hepatic lesions. MRCP images demonstrate no intrahepatic biliary ductal dilatation. Common bile duct measures up to 9 mm in the porta hepatis, which is mildly enlarged, but may be within normal limits for the patient's age. However, MRCP images also demonstrate 2 filling defects within the distal common bile duct, largest of which measures 9 mm, compatible with choledocholithiasis. Gallbladder is moderately distended. Within the dependent portion of the gallbladder there is some amorphous material which is T1 isointense and less T2 hyperintense than normal bile, compatible with biliary sludge. Small signal voids within this also represent gallstones. Gallbladder wall does not appear thickened, however, there is a trace amount of pericholecystic fluid. Pancreas: In the anterior aspect of the pancreatic head there is a 6 mm T1 hypointense, T2 hyperintense, nonenhancing lesion, likely to represent a tiny pancreatic pseudocyst. No other larger pancreatic mass. No pancreatic ductal dilatation. No pancreatic or peripancreatic fluid or inflammatory changes. Spleen:  Unremarkable. Adrenals/Urinary Tract: Multiple T1 hypointense, T2 hyperintense, nonenhancing lesions associated with both kidneys, compatible with a combination of simple cysts and parapelvic cysts. No hydroureteronephrosis in the visualized portions of the abdomen. Bilateral adrenal glands are normal in appearance. Stomach/Bowel: Several colonic diverticulae are noted in the region of the descending colon. Otherwise, unremarkable. Vascular/Lymphatic: Extensive atherosclerosis in the abdominal vasculature, without aneurysm. No lymphadenopathy noted in the abdomen. Other: No significant volume of ascites. Bilateral  perinephric stranding (nonspecific). Musculoskeletal: No aggressive osseous lesions are noted in the visualized portions of the skeleton. IMPRESSION: 1. Study is positive for choledocholithiasis. Common bile duct is mildly dilated measuring 9 mm, however, this may be within normal limits given the patient's advanced age. No intrahepatic biliary ductal dilatation. There is also some biliary sludge and small gallstones lying dependently in the gallbladder. Gallbladder wall does not appear thickened, however, there is a small volume of pericholecystic fluid. Clinical correlation for signs and symptoms of acute cholecystitis is suggested as these findings suspicious but not definitive. 2. Aortic atherosclerosis. 3. Colonic diverticulosis.  4. Additional incidental findings, as above. Electronically Signed   By: Vinnie Langton M.D.   On: 02/15/2016 12:58   Dg Ercp Biliary & Pancreatic Ducts  Result Date: 02/16/2016 CLINICAL DATA:  Cholelithiasis EXAM: ERCP TECHNIQUE: Multiple spot images obtained with the fluoroscopic device and submitted for interpretation post-procedure. FLUOROSCOPY TIME:  Fluoroscopy Time:  3 minutes and 40 seconds Radiation Exposure Index (if provided by the fluoroscopic device): Number of Acquired Spot Images: 5 COMPARISON:  None. FINDINGS: Contrast fills the biliary tree. Balloon stone retrieval is documented. IMPRESSION: See above. These images were submitted for radiologic interpretation only. Please see the procedural report for the amount of contrast and the fluoroscopy time utilized. Electronically Signed   By: Marybelle Killings M.D.   On: 02/16/2016 13:29   Mr Abdomen Mrcp Moise Boring Contast  Result Date: 02/15/2016 CLINICAL DATA:  82 year old male with history of abnormal liver function tests. Possible choledocholithiasis noted on prior CT of the abdomen and pelvis. Followup study. EXAM: MRI ABDOMEN WITHOUT AND WITH CONTRAST (INCLUDING MRCP) TECHNIQUE: Multiplanar multisequence MR imaging of  the abdomen was performed both before and after the administration of intravenous contrast. Heavily T2-weighted images of the biliary and pancreatic ducts were obtained, and three-dimensional MRCP images were rendered by post processing. CONTRAST:  2mL MULTIHANCE GADOBENATE DIMEGLUMINE 529 MG/ML IV SOLN COMPARISON:  No prior abdominal MRI. CT the abdomen and pelvis 02/14/2016. FINDINGS: Lower chest: Unremarkable. Hepatobiliary: No discrete cystic or solid hepatic lesions. MRCP images demonstrate no intrahepatic biliary ductal dilatation. Common bile duct measures up to 9 mm in the porta hepatis, which is mildly enlarged, but may be within normal limits for the patient's age. However, MRCP images also demonstrate 2 filling defects within the distal common bile duct, largest of which measures 9 mm, compatible with choledocholithiasis. Gallbladder is moderately distended. Within the dependent portion of the gallbladder there is some amorphous material which is T1 isointense and less T2 hyperintense than normal bile, compatible with biliary sludge. Small signal voids within this also represent gallstones. Gallbladder wall does not appear thickened, however, there is a trace amount of pericholecystic fluid. Pancreas: In the anterior aspect of the pancreatic head there is a 6 mm T1 hypointense, T2 hyperintense, nonenhancing lesion, likely to represent a tiny pancreatic pseudocyst. No other larger pancreatic mass. No pancreatic ductal dilatation. No pancreatic or peripancreatic fluid or inflammatory changes. Spleen:  Unremarkable. Adrenals/Urinary Tract: Multiple T1 hypointense, T2 hyperintense, nonenhancing lesions associated with both kidneys, compatible with a combination of simple cysts and parapelvic cysts. No hydroureteronephrosis in the visualized portions of the abdomen. Bilateral adrenal glands are normal in appearance. Stomach/Bowel: Several colonic diverticulae are noted in the region of the descending colon.  Otherwise, unremarkable. Vascular/Lymphatic: Extensive atherosclerosis in the abdominal vasculature, without aneurysm. No lymphadenopathy noted in the abdomen. Other: No significant volume of ascites. Bilateral perinephric stranding (nonspecific). Musculoskeletal: No aggressive osseous lesions are noted in the visualized portions of the skeleton. IMPRESSION: 1. Study is positive for choledocholithiasis. Common bile duct is mildly dilated measuring 9 mm, however, this may be within normal limits given the patient's advanced age. No intrahepatic biliary ductal dilatation. There is also some biliary sludge and small gallstones lying dependently in the gallbladder. Gallbladder wall does not appear thickened, however, there is a small volume of pericholecystic fluid. Clinical correlation for signs and symptoms of acute cholecystitis is suggested as these findings suspicious but not definitive. 2. Aortic atherosclerosis. 3. Colonic diverticulosis. 4. Additional incidental findings, as above. Electronically Signed  By: Vinnie Langton M.D.   On: 02/15/2016 12:58   US Abdomen Limited Ruq  Result Date: 02/14/2016 CLINICAL DATA:  Right upper quadrant pain for 1 day. EXAM: US ABDOMEN LIMITED - RIGHT UPPER QUADRANT COMPARISON:  CT abdomen and pelvis 02/14/2016 FINDINGS: Gallbladder: The gallbladder wall thickening (or combination of gallbladder wall and trace pericholecystic fluid) measures up to 7 mm in thickness. Small amount of gallbladder sludge. No sonographic Murphy sign noted by sonographer. Common bile duct: Diameter: 8 mm.  Distal portion not well visualized. Liver: Mild intrahepatic biliary dilatation. No focal liver lesion identified, however the left lobe was suboptimally visualized due to bowel gas. IMPRESSION: 1. Small amount of gallbladder sludge with mild gallbladder wall thickening (and/or trace pericholecystic fluid). Acute cholecystitis is not excluded in the appropriate clinical setting, however  sonographic Murphy sign was negative. 2. Mild intrahepatic and borderline extrahepatic biliary dilatation. The distal common bile duct was not well seen. MRCP or ERCP could be considered to further evaluate the possible distal common duct stone or other debris described on CT. Electronically Signed   By: Logan Bores M.D.   On: 02/14/2016 18:06    Lab Data:  CBC:  Recent Labs Lab 02/14/16 1425 02/15/16 0513 02/16/16 0348 02/17/16 0401  WBC 14.2* 14.0* 12.1* 8.6  NEUTROABS  --  12.4* 10.1* 6.5  HGB 15.2 13.4 13.6 12.5*  HCT 44.4 39.2 39.1 36.4*  MCV 93.9 93.8 92.7 92.4  PLT 163 154 131* [id number]*   Basic Metabolic Panel:  Recent Labs Lab 02/14/16 1425 02/15/16 0513 02/16/16 0348 02/17/16 0401  NA 137 137 137 137  K 4.2 4.8 3.6 3.1*  CL 103 106 104 104  CO2 22 22 21* 25  GLUCOSE 179* 131* 118* 90  BUN 22* 16 16 8   CREATININE 1.31* 1.55* 1.54* 1.28*  CALCIUM 9.8 9.0 8.7* 7.9*   GFR: Estimated Creatinine Clearance: 31.8 mL/min (by C-G formula based on SCr of 1.28 mg/dL (H)). Liver Function Tests:  Recent Labs Lab 02/14/16 1425 02/15/16 0513 02/16/16 0348 02/17/16 0401  AST 356* 1,149* 427* 155*  ALT 156* 823* 555* 328*  ALKPHOS 152* 170* 162* 137*  BILITOT 2.3* 4.3* 4.7* 2.7*  PROT 8.0 6.6 6.8 5.9*  ALBUMIN 3.9 3.0* 3.2* 2.6*    Recent Labs Lab 02/14/16 1425 02/17/16 0401  LIPASE 51 99*   No results for input(s): AMMONIA in the last 168 hours. Coagulation Profile:  Recent Labs Lab 02/15/16 0513  INR 1.20   Cardiac Enzymes: No results for input(s): CKTOTAL, CKMB, CKMBINDEX, TROPONINI in the last 168 hours. BNP (last 3 results) No results for input(s): PROBNP in the last 8760 hours. HbA1C: No results for input(s): HGBA1C in the last 72 hours. CBG:  Recent Labs Lab 02/17/16 0556 02/17/16 1155 02/17/16 1810 02/18/16 0011 02/18/16 0633  GLUCAP 91 95 101* 92 101*   Lipid Profile: No results for input(s): CHOL, HDL, LDLCALC, TRIG, CHOLHDL,  LDLDIRECT in the last 72 hours. Thyroid Function Tests: No results for input(s): TSH, T4TOTAL, FREET4, T3FREE, THYROIDAB in the last 72 hours. Anemia Panel: No results for input(s): VITAMINB12, FOLATE, FERRITIN, TIBC, IRON, RETICCTPCT in the last 72 hours. Urine analysis:    Component Value Date/Time   COLORURINE YELLOW 02/14/2016 [place] 02/14/2016 1506   LABSPEC 1.018 02/14/2016 1506   PHURINE 6.5 02/14/2016 1506   GLUCOSEU NEGATIVE 02/14/2016 1506   HGBUR NEGATIVE 02/14/2016 1506   BILIRUBINUR NEGATIVE 02/14/2016 1506   BILIRUBINUR n 07/25/2015 1236  KETONESUR 40 (A) 02/14/2016 1506   PROTEINUR NEGATIVE 02/14/2016 1506   UROBILINOGEN 0.2 07/25/2015 1236   UROBILINOGEN 1.0 12/22/2013 0341   NITRITE NEGATIVE 02/14/2016 1506   LEUKOCYTESUR NEGATIVE 02/14/2016 1506     Justus Droke M.D. [facility] 02/18/2016, 11:46 AM  Pager: [phone] Between 7am to 7pm - call Pager - [phone]  After 7pm go to [email or web address] - password [email or web address]  Call night coverage person covering after 7pm

## 2016-02-18 NOTE — Anesthesia Postprocedure Evaluation (Signed)
Anesthesia Post Note  Patient: Allen Wheeler  Procedure(s) Performed: Procedure(s) (LRB): LAPAROSCOPIC CHOLECYSTECTOMY WITH POSSIBLE INTRAOPERATIVE CHOLANGIOGRAM (N/A)  Patient location during evaluation: PACU Anesthesia Type: General Level of consciousness: awake and alert Pain management: pain level controlled Vital Signs Assessment: post-procedure vital signs reviewed and stable Respiratory status: spontaneous breathing, nonlabored ventilation, respiratory function stable and patient connected to nasal cannula oxygen Cardiovascular status: blood pressure returned to baseline and stable Postop Assessment: no signs of nausea or vomiting Anesthetic complications: no    Last Vitals:  Vitals:   02/18/16 0932 02/18/16 1000  BP: (!) 163/78 (!) 162/69  Pulse: (!) 56 (!) 59  Resp: 13 20  Temp: 36.8 C 36.7 C    Last Pain:  Vitals:   02/18/16 0932  TempSrc:   PainSc: [place]

## 2016-02-18 NOTE — Anesthesia Procedure Notes (Signed)
Procedure Name: Intubation Date/Time: 02/18/2016 7:44 AM Performed by: Izora Gala Pre-anesthesia Checklist: Patient identified, Emergency Drugs available, Suction available and Patient being monitored Patient Re-evaluated:Patient Re-evaluated prior to inductionOxygen Delivery Method: Circle system utilized Preoxygenation: Pre-oxygenation with 100% oxygen Intubation Type: IV induction Ventilation: Mask ventilation without difficulty Laryngoscope Size: Miller and 3 Grade View: Grade II Tube type: Oral Tube size: 7.5 mm Number of attempts: 1 Airway Equipment and Method: Stylet Placement Confirmation: ETT inserted through vocal cords under direct vision,  positive ETCO2 and breath sounds checked- equal and bilateral Secured at: 21 cm Tube secured with: Tape Dental Injury: Teeth and Oropharynx as per pre-operative assessment

## 2016-02-19 ENCOUNTER — Encounter (HOSPITAL_COMMUNITY): Payer: Self-pay | Admitting: General Surgery

## 2016-02-19 LAB — COMPREHENSIVE METABOLIC PANEL
ALT: 184 U/L — ABNORMAL HIGH (ref 17–63)
AST: 65 U/L — AB (ref 15–41)
Albumin: 2.4 g/dL — ABNORMAL LOW (ref 3.5–5.0)
Alkaline Phosphatase: 141 U/L — ABNORMAL HIGH (ref 38–126)
Anion gap: 9 (ref 5–15)
BILIRUBIN TOTAL: 2 mg/dL — AB (ref 0.3–1.2)
BUN: 6 mg/dL (ref 6–20)
CO2: 22 mmol/L (ref 22–32)
CREATININE: 0.95 mg/dL (ref 0.61–1.24)
Calcium: 8.2 mg/dL — ABNORMAL LOW (ref 8.9–10.3)
Chloride: 107 mmol/L (ref 101–111)
Glucose, Bld: 97 mg/dL (ref 65–99)
POTASSIUM: 3.1 mmol/L — AB (ref 3.5–5.1)
Sodium: 138 mmol/L (ref 135–145)
TOTAL PROTEIN: 5.8 g/dL — AB (ref 6.5–8.1)

## 2016-02-19 LAB — GLUCOSE, CAPILLARY
GLUCOSE-CAPILLARY: 103 mg/dL — AB (ref 65–99)
GLUCOSE-CAPILLARY: 109 mg/dL — AB (ref 65–99)
GLUCOSE-CAPILLARY: 120 mg/dL — AB (ref 65–99)
Glucose-Capillary: 89 mg/dL (ref 65–99)
Glucose-Capillary: 118 mg/dL — ABNORMAL HIGH (ref 65–99)

## 2016-02-19 MED ORDER — SENNOSIDES-DOCUSATE SODIUM 8.6-50 MG PO TABS
1.0000 | ORAL_TABLET | Freq: Two times a day (BID) | ORAL | Status: DC
  Administered 2016-02-19 – 2016-02-20 (×3): 1 via ORAL
  Filled 2016-02-19 (×4): qty 1

## 2016-02-19 MED ORDER — TRAMADOL HCL 50 MG PO TABS
50.0000 mg | ORAL_TABLET | Freq: Three times a day (TID) | ORAL | Status: DC | PRN
  Administered 2016-02-20: 50 mg via ORAL
  Filled 2016-02-19 (×2): qty 1

## 2016-02-19 MED ORDER — PROMETHAZINE HCL 12.5 MG PO TABS: 12.5000 mg | ORAL_TABLET | Freq: Four times a day (QID) | ORAL | 0 refills | Status: DC | PRN

## 2016-02-19 MED ORDER — POTASSIUM CHLORIDE CRYS ER 20 MEQ PO TBCR
40.0000 meq | EXTENDED_RELEASE_TABLET | Freq: Once | ORAL | Status: AC
  Administered 2016-02-19: 40 meq via ORAL
  Filled 2016-02-19: qty 2

## 2016-02-19 MED ORDER — SENNOSIDES-DOCUSATE SODIUM 8.6-50 MG PO TABS: 1.0000 | ORAL_TABLET | Freq: Two times a day (BID) | ORAL | Status: DC

## 2016-02-19 MED ORDER — SIMETHICONE 80 MG PO CHEW: 80.0000 mg | CHEWABLE_TABLET | Freq: Four times a day (QID) | ORAL | 0 refills | Status: DC | PRN

## 2016-02-19 MED ORDER — SENNOSIDES-DOCUSATE SODIUM 8.6-50 MG PO TABS: 1.0000 | ORAL_TABLET | Freq: Two times a day (BID) | ORAL | 0 refills | Status: DC | PRN

## 2016-02-19 MED ORDER — TRAMADOL HCL 50 MG PO TABS
50.0000 mg | ORAL_TABLET | Freq: Three times a day (TID) | ORAL | 0 refills | Status: DC | PRN
Start: 2016-02-19 — End: 2016-03-19

## 2016-02-19 MED ORDER — POLYETHYLENE GLYCOL 3350 17 G PO PACK: 17.0000 g | PACK | Freq: Every day | ORAL | Status: DC | PRN

## 2016-02-19 MED ORDER — POLYETHYLENE GLYCOL 3350 17 G PO PACK
17.0000 g | PACK | Freq: Once | ORAL | Status: AC
  Administered 2016-02-19: 17 g via ORAL
  Filled 2016-02-19: qty 1

## 2016-02-19 MED ORDER — HEPARIN SODIUM (PORCINE) 5000 UNIT/ML IJ SOLN
5000.0000 [IU] | Freq: Three times a day (TID) | INTRAMUSCULAR | Status: DC
  Administered 2016-02-19 – 2016-02-20 (×4): 5000 [IU] via SUBCUTANEOUS
  Filled 2016-02-19 (×4): qty 1

## 2016-02-19 NOTE — Discharge Instructions (Signed)

## 2016-02-19 NOTE — Discharge Summary (Addendum)
Physician Discharge Summary   Patient ID: Allen Wheeler MRN: [id number] DOB/AGE: 80-Jun-1921 80 y.o.  Admit date: 02/14/2016 Discharge date: 02/20/2016  Primary Care Physician:  [name], MD  Discharge Diagnoses:    . Cholelithiasis with acute cholangitis with biliary obstruction Status post cholecystectomy   Ileus . Depression . GERD . Hypothyroidism . Anxiety . Sepsis ([place])   Consults:  Gen. surgery  Recommendations for Outpatient Follow-up:  1. please repeat CBC/BMET at next visit 2. Please follow blood/urine cultures till final   DIET: Heart healthy diet    Allergies:  No Known Allergies   DISCHARGE MEDICATIONS: Current Discharge Medication List    START taking these medications   Details  promethazine (PHENERGAN) 12.5 MG tablet Take 1 tablet (12.5 mg total) by mouth every 6 (six) hours as needed for nausea or vomiting. Qty: 30 tablet, Refills: 0    senna-docusate (SENOKOT-S) 8.6-50 MG tablet Take 1 tablet by mouth 2 (two) times daily as needed for mild constipation. Qty: 10 tablet, Refills: 0    simethicone (MYLICON) 80 MG chewable tablet Chew 1 tablet (80 mg total) by mouth every 6 (six) hours as needed for flatulence. Qty: 30 tablet, Refills: 0    traMADol (ULTRAM) 50 MG tablet Take 1 tablet (50 mg total) by mouth every 8 (eight) hours as needed for severe pain. Qty: 20 tablet, Refills: 0      CONTINUE these medications which have NOT CHANGED   Details  azelastine (ASTELIN) 0.1 % nasal spray USE 2 SPRAYS EACH NOSTRIL TWICE A DAY. Qty: 30 mL, Refills: 11    citalopram (CELEXA) 10 MG tablet Take 1 tablet (10 mg total) by mouth daily. Qty: 30 tablet, Refills: 11    Difluprednate (DUREZOL) 0.05 % EMUL Place 1 drop into both eyes 2 (two) times daily.     fluticasone (FLONASE) 50 MCG/ACT nasal spray USE 2 SPRAYS IN EACH NOSTRIL ONCE DAILY Qty: 16 g, Refills: 0    gabapentin (NEURONTIN) 100 MG capsule TAKE 1 CAPSULE AT BEDTIME. Qty: 30 capsule,  Refills: 3    levothyroxine (SYNTHROID, LEVOTHROID) 50 MCG tablet Take 1 tablet (50 mcg total) by mouth daily. Qty: 90 tablet, Refills: 3   Associated Diagnoses: Hypothyroidism, unspecified hypothyroidism type    LORazepam (ATIVAN) 1 MG tablet TAKE 1 TABLET AT BEDTIME AS NEEDED. Qty: 30 tablet, Refills: 5    magnesium oxide (MAG-OX) 400 MG tablet Take 400 mg by mouth daily.    ofloxacin (OCUFLOX) 0.3 % ophthalmic solution Place 1 drop into both eyes 2 (two) times daily.    omeprazole (PRILOSEC) 40 MG capsule TAKE 1 CAPSULE DAILY. Qty: 90 capsule, Refills: 1    polyethylene glycol (MIRALAX / GLYCOLAX) packet Take 17 g by mouth daily as needed for moderate constipation.    psyllium (HYDROCIL/METAMUCIL) 95 % PACK Take 1 packet by mouth daily as needed for moderate constipation.      STOP taking these medications     ciprofloxacin-dexamethasone (CIPRODEX) otic suspension      HYDROcodone-homatropine (HYDROMET) 5-1.5 MG/5ML syrup          Brief H and P: For complete details please refer to admission H and P, but in brief  Allen Wheeler a 82 y.o.malewith medical history significant of seasonal allergies, anxiety, depression, GERD, nephrolithiasis, osteoarthritis Who presented with one-day history of right upper quadrant abdominal pain. Patient also reported persistent nausea and dry heaving after eating breakfast on the morning of admission. He had a similar attack 2 months ago with negative  workup. Workup showed WBC of 14.2, hepatic panel function with AST of 356, ALT of 156,alkaline phosphatase 152 units. His total bilirubin of 2.3 mg/dL. CT abdomen and pelvis showed gallstones with mild intra-and extrahepatic biliary dilatation with possible stone in distal CBD at the head of pancreas. Patient was admitted to further workup  Hospital Course:   Cholelithiasis with acute cholangitis with biliary obstruction - Sepsis ruled out - LFTs/ bilirubin trended up to 4.3, . Patient was  placed on pain control IV fluids and IV antibiotics. -MRCP Showed choledocholithiasis, CBD mildly dilated measuring 9 mm, no intrahepatic duct dilatation, some Bentley sludge and small gallstones lying dependently in the gallbladder.  - ERCP 11/9 showed choledocholithiasis, complete removal was accomplished with biliary sphincterotomy and balloon extraction - Patient underwent laparoscopic cholecystectomy on 11/11. He is now tolerating solid diet. Patient will follow-up with Dr. Kieth Brightly in 2-3 weeks.  Ileus Postoperatively patient had abdominal distention with constipation and ileus He was placed on stool softeners, MiraLAX, bowel regimen Narcotics were discontinued. Placed on oral tramadol as needed for pain Prior to discharge, patient had a large bowel movement with significant improvement in the abdominal distention.  Left Hand weakness Due to radial nerve palsy, patient has a history of osteoarthritis On 11/10, patient complained of left hand weakness and unable to pick up his. There was a concern for possible stroke. Neurology was consulted. - MRI of the brain done and was negative for acute CVA. Per neurological examination likely was due to radial nerve palsy from osteoarthritis and recommended outpatient EMG. Referral has been sent for outpatient EMG and a follow-up with [facility] neurology.   Anxiety, depression - Continue Celexa 20 mg by mouth daily.  GERD Continue Protonix  Hypothyroidism Continue Synthroid  Day of Discharge BP 138/77 (BP Location: Left Arm)   Pulse 62   Temp 98 F (36.7 C) (Oral)   Resp 19   Ht 5\' 6"  (1.676 m)   Wt 65.8 kg (145 lb)   SpO2 99%   BMI 23.40 kg/m   Physical Exam: General: Alert and awake oriented x3 not in any acute distress. HEENT: anicteric sclera, pupils reactive to light and accommodation CVS: S1-S2 clear no murmur rubs or gallops Chest: clear to auscultation bilaterally, no wheezing rales or rhonchi Abdomen: soft  nontender, distention improving, normal bowel sounds Extremities: no cyanosis, clubbing or edema noted bilaterally Neuro: Cranial nerves II-XII intact, no focal neurological deficits   The results of significant diagnostics from this hospitalization (including imaging, microbiology, ancillary and laboratory) are listed below for reference.    LAB RESULTS: Basic Metabolic Panel:  Recent Labs Lab 02/19/16 0437 02/20/16 0316  NA 138 135  K 3.1* 3.2*  CL 107 105  CO2 22 22  GLUCOSE 97 104*  BUN 6 11  CREATININE 0.95 1.08  CALCIUM 8.2* 8.5*  MG  --  1.7   Liver Function Tests:  Recent Labs Lab 02/19/16 0437 02/20/16 0316  AST 65* 50*  ALT 184* 155*  ALKPHOS 141* 157*  BILITOT 2.0* 1.8*  PROT 5.8* 5.9*  ALBUMIN 2.4* 2.6*    Recent Labs Lab 02/17/16 0401 02/18/16 1037  LIPASE 99* 38   No results for input(s): AMMONIA in the last 168 hours. CBC:  Recent Labs Lab 02/16/16 0348 02/17/16 0401  WBC 12.1* 8.6  NEUTROABS 10.1* 6.5  HGB 13.6 12.5*  HCT 39.1 36.4*  MCV 92.7 92.4  PLT 131* 133*   Cardiac Enzymes: No results for input(s): CKTOTAL, CKMB, CKMBINDEX, TROPONINI in  the last 168 hours. BNP: Invalid input(s): POCBNP CBG:  Recent Labs Lab 02/19/16 1735 02/20/16 0000  GLUCAP 109* 103*    Significant Diagnostic Studies:  Dg Chest 2 View  Result Date: 02/14/2016 CLINICAL DATA:  Epigastric abdominal pain beginning this morning. Nausea. Former smoker. EXAM: CHEST  2 VIEW COMPARISON:  12/22/2013 FINDINGS: The patient is mildly rotated to the right. Cardiomediastinal silhouette is unchanged within this limitation. Normal heart size. There is mild interstitial coarsening which has mildly increased from the prior study. Mild right basilar opacity is present. A 9 mm nodule projects in the left lung base. No pleural effusion or pneumothorax is seen. No acute osseous abnormality is identified. IMPRESSION: 1. Mild bronchitic changes. 2. Mild right basilar opacity,  likely atelectasis. 3. Left lung base nodule. See CT abdomen and pelvis report from today. Electronically Signed   By: Logan Bores M.D.   On: 02/14/2016 17:14   Ct Abdomen Pelvis W Contrast  Addendum Date: 02/14/2016   ADDENDUM REPORT: 02/14/2016 17:46 ADDENDUM: Bilateral right greater than left fat filled inguinal hernias Electronically Signed   By: Donavan Foil M.D.   On: 02/14/2016 17:46   Result Date: 02/14/2016 CLINICAL DATA:  Upper abdominal pain after eating cereal.  Nausea EXAM: CT ABDOMEN AND PELVIS WITH CONTRAST TECHNIQUE: Multidetector CT imaging of the abdomen and pelvis was performed using the standard protocol following bolus administration of intravenous contrast. CONTRAST:  51mL ISOVUE-300 IOPAMIDOL (ISOVUE-300) INJECTION 61% COMPARISON:  None. FINDINGS: Lower chest: There is patchy dependent atelectasis. Mild right middle lobe and lingular atelectasis or scarring. No pleural effusions. There are multiple pulmonary nodules visualized at the lung bases. There it is a a 3 mm left upper lobe lateral lung nodule, series 205, image number 3. There is a a 4 mm nodule along the left fissure, series 205, image number 7. There is a 8 mm pulmonary nodule within the superior portion of left lower lobe, series 205, image number 1. There is additional 3 mm left subpleural posterior nodule, series 205, image number 9. There is a a 6 mm subpleural right lower lobe pulmonary nodule series 205, image number 5. Mild cardiomegaly. Coronary artery calcifications. Aortic valvular calcifications. Hepatobiliary: Mild intra hepatic biliary dilatation. Multiple calcified granuloma. The gallbladder is slightly enlarged. There are multiple small stones within the gallbladder. There is mild enlargement of the extrahepatic common bile duct, measuring up to 9 mm. There is high density material or possible stones within the distal common bile duct at the head of the pancreas, series 201, image number 40 and coronal series  203, image number 68. Pancreas: Unremarkable. No pancreatic ductal dilatation or surrounding inflammatory changes. Spleen: Multiple calcified splenic granuloma. Spleen otherwise unremarkable. Adrenals/Urinary Tract: Adrenal glands are within normal limits. There are multiple hypodense lesions within the kidneys, many of which are too small to further characterize but favor cysts. The bladder is unremarkable. Stomach/Bowel: No dilated large or small bowel. Stomach grossly unremarkable. Appendix visualized and within normal limits. Vascular/Lymphatic: Ectatic abdominal aorta with mural thrombus and atherosclerosis. There is ectasia of the iliac arteries. No significantly enlarged pelvic or abdominal lymph nodes. Reproductive: Prostate gland is enlarged with calcifications. Other: No free air or free fluid Musculoskeletal: Degenerative changes of the spine. No suspicious osseous lesions. IMPRESSION: 1. Slightly enlarged gallbladder which contains small calcified stones. There is mild intra and extrahepatic biliary dilatation with small amount of high density material or possible stone within the distal common bile duct at the head of the pancreas.  Recommend correlation with laboratory values, if obstruction is suggested, further evaluation with MR or ERCP may be obtained. 2. Multiple calcified granulomas within the liver and spleen. 3. Multiple pulmonary nodules within the bilateral lung bases. No follow-up needed if patient is low-risk (and has no known or suspected primary neoplasm). Non-contrast chest CT can be considered in 12 months if patient is high-risk. This recommendation follows the consensus statement: Guidelines for Management of Incidental Pulmonary Nodules Detected on CT Images: From the Fleischner Society 2017; Radiology 2017; 284:228-243. Electronically Signed: By: Donavan Foil M.D. On: 02/14/2016 17:10   Mr 3d Recon At Scanner  Result Date: 02/15/2016 CLINICAL DATA:  82 year old male with history  of abnormal liver function tests. Possible choledocholithiasis noted on prior CT of the abdomen and pelvis. Followup study. EXAM: MRI ABDOMEN WITHOUT AND WITH CONTRAST (INCLUDING MRCP) TECHNIQUE: Multiplanar multisequence MR imaging of the abdomen was performed both before and after the administration of intravenous contrast. Heavily T2-weighted images of the biliary and pancreatic ducts were obtained, and three-dimensional MRCP images were rendered by post processing. CONTRAST:  92mL MULTIHANCE GADOBENATE DIMEGLUMINE 529 MG/ML IV SOLN COMPARISON:  No prior abdominal MRI. CT the abdomen and pelvis 02/14/2016. FINDINGS: Lower chest: Unremarkable. Hepatobiliary: No discrete cystic or solid hepatic lesions. MRCP images demonstrate no intrahepatic biliary ductal dilatation. Common bile duct measures up to 9 mm in the porta hepatis, which is mildly enlarged, but may be within normal limits for the patient's age. However, MRCP images also demonstrate 2 filling defects within the distal common bile duct, largest of which measures 9 mm, compatible with choledocholithiasis. Gallbladder is moderately distended. Within the dependent portion of the gallbladder there is some amorphous material which is T1 isointense and less T2 hyperintense than normal bile, compatible with biliary sludge. Small signal voids within this also represent gallstones. Gallbladder wall does not appear thickened, however, there is a trace amount of pericholecystic fluid. Pancreas: In the anterior aspect of the pancreatic head there is a 6 mm T1 hypointense, T2 hyperintense, nonenhancing lesion, likely to represent a tiny pancreatic pseudocyst. No other larger pancreatic mass. No pancreatic ductal dilatation. No pancreatic or peripancreatic fluid or inflammatory changes. Spleen:  Unremarkable. Adrenals/Urinary Tract: Multiple T1 hypointense, T2 hyperintense, nonenhancing lesions associated with both kidneys, compatible with a combination of simple cysts  and parapelvic cysts. No hydroureteronephrosis in the visualized portions of the abdomen. Bilateral adrenal glands are normal in appearance. Stomach/Bowel: Several colonic diverticulae are noted in the region of the descending colon. Otherwise, unremarkable. Vascular/Lymphatic: Extensive atherosclerosis in the abdominal vasculature, without aneurysm. No lymphadenopathy noted in the abdomen. Other: No significant volume of ascites. Bilateral perinephric stranding (nonspecific). Musculoskeletal: No aggressive osseous lesions are noted in the visualized portions of the skeleton. IMPRESSION: 1. Study is positive for choledocholithiasis. Common bile duct is mildly dilated measuring 9 mm, however, this may be within normal limits given the patient's advanced age. No intrahepatic biliary ductal dilatation. There is also some biliary sludge and small gallstones lying dependently in the gallbladder. Gallbladder wall does not appear thickened, however, there is a small volume of pericholecystic fluid. Clinical correlation for signs and symptoms of acute cholecystitis is suggested as these findings suspicious but not definitive. 2. Aortic atherosclerosis. 3. Colonic diverticulosis. 4. Additional incidental findings, as above. Electronically Signed   By: Vinnie Langton M.D.   On: 02/15/2016 12:58   Mr Abdomen Mrcp Moise Boring Contast  Result Date: 02/15/2016 CLINICAL DATA:  82 year old male with history of abnormal liver function tests.  Possible choledocholithiasis noted on prior CT of the abdomen and pelvis. Followup study. EXAM: MRI ABDOMEN WITHOUT AND WITH CONTRAST (INCLUDING MRCP) TECHNIQUE: Multiplanar multisequence MR imaging of the abdomen was performed both before and after the administration of intravenous contrast. Heavily T2-weighted images of the biliary and pancreatic ducts were obtained, and three-dimensional MRCP images were rendered by post processing. CONTRAST:  37mL MULTIHANCE GADOBENATE DIMEGLUMINE 529 MG/ML  IV SOLN COMPARISON:  No prior abdominal MRI. CT the abdomen and pelvis 02/14/2016. FINDINGS: Lower chest: Unremarkable. Hepatobiliary: No discrete cystic or solid hepatic lesions. MRCP images demonstrate no intrahepatic biliary ductal dilatation. Common bile duct measures up to 9 mm in the porta hepatis, which is mildly enlarged, but may be within normal limits for the patient's age. However, MRCP images also demonstrate 2 filling defects within the distal common bile duct, largest of which measures 9 mm, compatible with choledocholithiasis. Gallbladder is moderately distended. Within the dependent portion of the gallbladder there is some amorphous material which is T1 isointense and less T2 hyperintense than normal bile, compatible with biliary sludge. Small signal voids within this also represent gallstones. Gallbladder wall does not appear thickened, however, there is a trace amount of pericholecystic fluid. Pancreas: In the anterior aspect of the pancreatic head there is a 6 mm T1 hypointense, T2 hyperintense, nonenhancing lesion, likely to represent a tiny pancreatic pseudocyst. No other larger pancreatic mass. No pancreatic ductal dilatation. No pancreatic or peripancreatic fluid or inflammatory changes. Spleen:  Unremarkable. Adrenals/Urinary Tract: Multiple T1 hypointense, T2 hyperintense, nonenhancing lesions associated with both kidneys, compatible with a combination of simple cysts and parapelvic cysts. No hydroureteronephrosis in the visualized portions of the abdomen. Bilateral adrenal glands are normal in appearance. Stomach/Bowel: Several colonic diverticulae are noted in the region of the descending colon. Otherwise, unremarkable. Vascular/Lymphatic: Extensive atherosclerosis in the abdominal vasculature, without aneurysm. No lymphadenopathy noted in the abdomen. Other: No significant volume of ascites. Bilateral perinephric stranding (nonspecific). Musculoskeletal: No aggressive osseous lesions are  noted in the visualized portions of the skeleton. IMPRESSION: 1. Study is positive for choledocholithiasis. Common bile duct is mildly dilated measuring 9 mm, however, this may be within normal limits given the patient's advanced age. No intrahepatic biliary ductal dilatation. There is also some biliary sludge and small gallstones lying dependently in the gallbladder. Gallbladder wall does not appear thickened, however, there is a small volume of pericholecystic fluid. Clinical correlation for signs and symptoms of acute cholecystitis is suggested as these findings suspicious but not definitive. 2. Aortic atherosclerosis. 3. Colonic diverticulosis. 4. Additional incidental findings, as above. Electronically Signed   By: Vinnie Langton M.D.   On: 02/15/2016 12:58   US Abdomen Limited Ruq  Result Date: 02/14/2016 CLINICAL DATA:  Right upper quadrant pain for 1 day. EXAM: US ABDOMEN LIMITED - RIGHT UPPER QUADRANT COMPARISON:  CT abdomen and pelvis 02/14/2016 FINDINGS: Gallbladder: The gallbladder wall thickening (or combination of gallbladder wall and trace pericholecystic fluid) measures up to 7 mm in thickness. Small amount of gallbladder sludge. No sonographic Murphy sign noted by sonographer. Common bile duct: Diameter: 8 mm.  Distal portion not well visualized. Liver: Mild intrahepatic biliary dilatation. No focal liver lesion identified, however the left lobe was suboptimally visualized due to bowel gas. IMPRESSION: 1. Small amount of gallbladder sludge with mild gallbladder wall thickening (and/or trace pericholecystic fluid). Acute cholecystitis is not excluded in the appropriate clinical setting, however sonographic Murphy sign was negative. 2. Mild intrahepatic and borderline extrahepatic biliary dilatation. The distal common bile  duct was not well seen. MRCP or ERCP could be considered to further evaluate the possible distal common duct stone or other debris described on CT. Electronically Signed   By:  Logan Bores M.D.   On: 02/14/2016 18:06    2D ECHO:   Disposition and Follow-up: Discharge Instructions    Ambulatory referral to Neurology    Complete by:  As directed    An appointment is requested in approximately: 4 weeks       DISPOSITION: home    [facility].   Specialty:  Radiology Contact information: [address] [id number] [place], [place]. Schedule an appointment as soon as possible for a visit in 2 week(s).   Specialty:  Family Medicine Contact information: [place] [address] [phone]        Mickeal Skinner, MD. Call.   Specialty:  General Surgery Why:  please call as soon as you leave the hospital to make a follow-up appointment with Dr. Kieth Brightly in 2-3 weeks Contact information: [place] [address] [phone]            Time spent on Discharge: 32mins   Signed:   RAI,RIPUDEEP M.D. [facility] 02/20/2016, 12:42 PM Pager: [phone]

## 2016-02-19 NOTE — Progress Notes (Signed)
Patient ID: Allen Wheeler, male   DOB: 1919-06-09, 82 y.o.   MRN: [id number]  [facility] Surgery Progress Note  1 Day Post-Op  Subjective: Doing well. Abdomen sore but tolerable. Ambulated this morning. Passing minimal flatus, abdomen feels bloated. Denies n/v. Tolerating diet.  Objective: Vital signs in last 24 hours: Temp:  [98 F (36.7 C)-98.4 F (36.9 C)] 98.4 F (36.9 C) (11/12 0555) Pulse Rate:  [50-58] 50 (11/12 0555) Resp:  [18-19] 19 (11/12 0555) BP: (122-148)/(62-76) 132/62 (11/12 0555) SpO2:  [99 %] 99 % (11/12 0555) Last BM Date: 02/13/16  Intake/Output from previous day: 11/11 0701 - 11/12 0700 In: 2813.8 [P.O.:360; I.V.:2403.8; IV Piggyback:50] Out: 1480 [Urine:1460; Blood:20] Intake/Output this shift: Total I/O In: -  Out: 350 [Urine:350]  PE: Gen:  Alert, NAD, pleasant Card:  RRR Pulm:  CTAB Abd: Soft, mild distension, +BS, no HSM, incisions C/D/I  Lab Results:   Recent Labs  02/17/16 0401  WBC 8.6  HGB 12.5*  HCT 36.4*  PLT 133*   BMET  Recent Labs  02/18/16 1037 02/19/16 0437  NA 133* 138  K 6.2* 3.1*  CL 104 107  CO2 19* 22  GLUCOSE 114* 97  BUN 8 6  CREATININE 1.02 0.95  CALCIUM 8.2* 8.2*   PT/INR No results for input(s): LABPROT, INR in the last 72 hours. CMP     Component Value Date/Time   NA 138 02/19/2016 0437   K 3.1 (L) 02/19/2016 0437   CL 107 02/19/2016 0437   CO2 22 02/19/2016 0437   GLUCOSE 97 02/19/2016 0437   BUN 6 02/19/2016 0437   CREATININE 0.95 02/19/2016 0437   CALCIUM 8.2 (L) 02/19/2016 0437   PROT 5.8 (L) 02/19/2016 0437   ALBUMIN 2.4 (L) 02/19/2016 0437   AST 65 (H) 02/19/2016 0437   ALT 184 (H) 02/19/2016 0437   ALKPHOS 141 (H) 02/19/2016 0437   BILITOT 2.0 (H) 02/19/2016 0437   GFRNONAA >60 02/19/2016 0437   GFRAA >60 02/19/2016 0437   Lipase     Component Value Date/Time   LIPASE 38 02/18/2016 1037       Studies/Results: Mr Brain Wo Contrast  Result Date:  02/17/2016 CLINICAL DATA:  Left hand weakness noted after ERCP yesterday. EXAM: MRI HEAD WITHOUT CONTRAST TECHNIQUE: Multiplanar, multiecho pulse sequences of the brain and surrounding structures were obtained without intravenous contrast. COMPARISON:  None. FINDINGS: Brain: No acute infarction, hemorrhage, hydrocephalus, extra-axial collection or mass lesion. Generalized cortical atrophy in keeping with age. No unexpected small-vessel ischemic change. Small calcification on the falx at the vertex, incidental and without mass effect. Vascular: Normal flow voids. Skull and upper cervical spine: Degenerative changes in the upper cervical spine with right atlanto occipital joint effusion. No focal marrow lesion. Sinuses/Orbits: Bilateral cataract resection.  No acute finding. IMPRESSION: Senescent changes without acute finding, including infarct. Electronically Signed   By: Monte Fantasia M.D.   On: 02/17/2016 14:07    Anti-infectives: Anti-infectives    Start     Dose/Rate Route Frequency Ordered Stop   02/18/16 0600  ceFAZolin (ANCEF) IVPB 2g/100 mL premix  Status:  Discontinued     2 g 200 mL/hr over 30 Minutes Intravenous To Short Stay 02/17/16 1459 02/18/16 1037   02/17/16 0600  ceFAZolin (ANCEF) IVPB 2g/100 mL premix  Status:  Discontinued     2 g 200 mL/hr over 30 Minutes Intravenous To Short Stay 02/16/16 1606 02/17/16 1459   02/14/16 2100  piperacillin-tazobactam (ZOSYN) IVPB 3.375 g  Status:  Discontinued     3.375 g 12.5 mL/hr over 240 Minutes Intravenous Every 8 hours 02/14/16 2051 02/18/16 1049       Assessment/Plan Cholelithiasis with choledocholithiasis. S/p ERCP/sphincterotomy 02/16/16, Dr. Teena Irani S/p Procedure(s): LAPAROSCOPIC CHOLECYSTECTOMY 02/18/16 Dr. Kieth Brightly - POD 1 - bilirubin still elevated but trending down 2.0 today from 3.58 - pain well controlled, passing little amount of flatus, tolerating diet  FEN - regular diet VTE - heparin, SCDs  Plan - patient  hopes to go home this afternoon if bloating/abdominal distension resolves, per medicine. Continue ambulation, bowel regimen. If this resolves ok from surgical standpoint for discharge. Will need to follow-up with Dr. Kieth Brightly in 2-3 weeks   LOS: 5 days    Jerrye Beavers , [facility] Surgery 02/19/2016, 10:38 AM Pager: [phone] Consults: [phone] Mon-Fri 7:00 am-4:30 pm Sat-Sun 7:00 am-11:30 am

## 2016-02-19 NOTE — Progress Notes (Addendum)
[facility]                                                                              Patient Demographics  Allen Wheeler, is a 82 y.o. male, DOB - Oct 10, 1919, [id number]  Admit date - 02/14/2016   Admitting Physician Reubin Milan, MD  Outpatient Primary MD for the patient is [name], MD  Outpatient specialists:   LOS - 5  days    Chief Complaint  Patient presents with  . Abdominal Pain    pt after eating some cereal having lower mid abdominal pain        Brief summary   HPI: Allen Wheeler is a 82 y.o. male with medical history significant of seasonal allergies, anxiety, depression, GERD, nephrolithiasis, osteoarthritis Who presented with one-day history of right upper quadrant abdominal pain. Patient also reported persistent nausea and dry heaving after eating breakfast on the morning of admission. He had a similar attack 2 months ago with negative workup. Workup showed WBC of 14.2, hepatic panel function with AST of 356, ALT of 156, alkaline phosphatase 152 units. His total bilirubin of 2.3 mg/dL. CT abdomen and pelvis showed gallstones with mild intra-and extrahepatic biliary dilatation with possible stone in distal CBD at the head of pancreas. Patient was admitted to further workup   Assessment & Plan    Principal Problem:  Cholelithiasis with acute cholangitis with biliary obstruction - post op day#1, sepsis was ruled out  - LFTs trending up with bilirubin trending up to 4.3, continue IV Zosyn, IV fluids, pain control -MRCP Showed choledocholithiasis, CBD mildly dilated measuring 9 mm, no intrahepatic duct dilatation, some Bentley sludge and small gallstones lying dependently in the gallbladder.  - ERCP done today showed choledocholithiasis, complete removal was accomplished with biliary sphincterotomy and balloon extraction - Gen. surgery following, lap cholecystectomy done on 11/11. 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    GERD Transition to oral PPI    Hypothyroidism Cont oral synthroid   Code Status: Full CODE STATUS  DVT Prophylaxis:  heparin  Family Communication: Discussed in detail with the patient, all imaging results, lab results explained to the patient, son at bed side.  D/w daughter in law again.   No BM by 3:20pm, will keep overnight.    Disposition Plan: Hopefully DC home in a.m.  Time Spent in minutes  25 minutes  Procedures:  CT abd  Consultants:   GI surgery  Antimicrobials:   zosyn   Medications  Scheduled Meds: . citalopram  20 mg Oral Daily  . gabapentin  100 mg Oral QHS  . heparin subcutaneous  5,000 Units Subcutaneous Q8H  . levothyroxine  50 mcg Oral QAC breakfast  . LORazepam  0.5 mg Oral QHS  . pantoprazole  40 mg  Oral Q0600  . polyethylene glycol  17 g Oral Once  . senna-docusate  1 tablet Oral BID  . sodium chloride flush  3 mL Intravenous Q12H   Continuous Infusions:  PRN Meds:.HYDROmorphone (DILAUDID) injection, ondansetron **OR** ondansetron (ZOFRAN) IV, simethicone, traMADol   Antibiotics   Anti-infectives    Start     Dose/Rate Route Frequency Ordered Stop   02/18/16 0600  ceFAZolin (ANCEF) IVPB 2g/100 mL premix  Status:  Discontinued     2 g 200 mL/hr over 30 Minutes Intravenous To Short Stay 02/17/16 1459 02/18/16 1037   02/17/16 0600  ceFAZolin (ANCEF) IVPB 2g/100 mL premix  Status:  Discontinued     2 g 200 mL/hr over 30 Minutes Intravenous To Short Stay 02/16/16 1606 02/17/16 1459   02/14/16 2100  piperacillin-tazobactam (ZOSYN) IVPB 3.375 g  Status:  Discontinued     3.375 g 12.5 mL/hr over 240  Minutes Intravenous Every 8 hours 02/14/16 2051 02/18/16 1049        Subjective:   Allen Wheeler was seen and examined today earlier. C/o abd bloating, passing minimal flatus but no BM yet. No nausea or vomiting, CP,SOB, fevers or chills.  Patient denies dizziness, weakness.     Objective:   Vitals:   02/18/16 1000 02/18/16 1415 02/18/16 2133 02/19/16 0555  BP: (!) 162/69 (!) 148/76 122/62 132/62  Pulse: (!) 59 (!) 55 (!) 58 (!) 50  Resp: 20 18 19 19   Temp: 98 F (36.7 C) 98 F (36.7 C) 98.4 F (36.9 C) 98.4 F (36.9 C)  TempSrc:   Oral Oral  SpO2: 100% 99% 99% 99%  Weight:      Height:        Intake/Output Summary (Last 24 hours) at 02/19/16 1518 Last data filed at 02/19/16 [id number]  Gross per 24 hour  Intake          2313.75 ml  Output             1010 ml  Net          1303.75 ml     Wt Readings from Last 3 Encounters:  02/14/16 65.8 kg (145 lb)  12/13/15 70.8 kg (156 lb)  07/25/15 75.3 kg (166 lb)     Exam  General: Alert and oriented x 3, NAD  HEENT:    Neck:   Cardiovascular: S1 S2 clear, RRR  Respiratory: CTAB  Gastrointestinal: Soft, incision clean, +distended, + bowel sounds  Ext: no cyanosis clubbing or edema  Neuro: weakness in the left hand+  Skin: No rashes  Psych: Normal affect and demeanor, alert and oriented x3    Data Reviewed:  I have personally reviewed following labs and imaging studies  Micro Results Recent Results (from the past 240 hour(s))  Surgical pcr screen     Status: None   Collection Time: 02/16/16  8:06 AM  Result Value Ref Range Status   MRSA, PCR NEGATIVE NEGATIVE Final   Staphylococcus aureus NEGATIVE NEGATIVE Final    Comment:        The Xpert SA Assay (FDA approved for NASAL specimens in patients over 72 years of age), is one component of a comprehensive surveillance program.  Test performance has been validated by [facility] for patients greater than or equal to 69 year old. It is not intended to  diagnose infection nor to guide or monitor treatment.     Radiology Reports Dg Chest 2 View  Result Date: 02/14/2016 CLINICAL DATA:  Epigastric abdominal pain  beginning this morning. Nausea. Former smoker. EXAM: CHEST  2 VIEW COMPARISON:  12/22/2013 FINDINGS: The patient is mildly rotated to the right. Cardiomediastinal silhouette is unchanged within this limitation. Normal heart size. There is mild interstitial coarsening which has mildly increased from the prior study. Mild right basilar opacity is present. A 9 mm nodule projects in the left lung base. No pleural effusion or pneumothorax is seen. No acute osseous abnormality is identified. IMPRESSION: 1. Mild bronchitic changes. 2. Mild right basilar opacity, likely atelectasis. 3. Left lung base nodule. See CT abdomen and pelvis report from today. Electronically Signed   By: Logan Bores M.D.   On: 02/14/2016 17:14   Mr Brain Wo Contrast  Result Date: 02/17/2016 CLINICAL DATA:  Left hand weakness noted after ERCP yesterday. EXAM: MRI HEAD WITHOUT CONTRAST TECHNIQUE: Multiplanar, multiecho pulse sequences of the brain and surrounding structures were obtained without intravenous contrast. COMPARISON:  None. FINDINGS: Brain: No acute infarction, hemorrhage, hydrocephalus, extra-axial collection or mass lesion. Generalized cortical atrophy in keeping with age. No unexpected small-vessel ischemic change. Small calcification on the falx at the vertex, incidental and without mass effect. Vascular: Normal flow voids. Skull and upper cervical spine: Degenerative changes in the upper cervical spine with right atlanto occipital joint effusion. No focal marrow lesion. Sinuses/Orbits: Bilateral cataract resection.  No acute finding. IMPRESSION: Senescent changes without acute finding, including infarct. Electronically Signed   By: Monte Fantasia M.D.   On: 02/17/2016 14:07   Ct Abdomen Pelvis W Contrast  Addendum Date: 02/14/2016   ADDENDUM REPORT:  02/14/2016 17:46 ADDENDUM: Bilateral right greater than left fat filled inguinal hernias Electronically Signed   By: Donavan Foil M.D.   On: 02/14/2016 17:46   Result Date: 02/14/2016 CLINICAL DATA:  Upper abdominal pain after eating cereal.  Nausea EXAM: CT ABDOMEN AND PELVIS WITH CONTRAST TECHNIQUE: Multidetector CT imaging of the abdomen and pelvis was performed using the standard protocol following bolus administration of intravenous contrast. CONTRAST:  7mL ISOVUE-300 IOPAMIDOL (ISOVUE-300) INJECTION 61% COMPARISON:  None. FINDINGS: Lower chest: There is patchy dependent atelectasis. Mild right middle lobe and lingular atelectasis or scarring. No pleural effusions. There are multiple pulmonary nodules visualized at the lung bases. There it is a a 3 mm left upper lobe lateral lung nodule, series 205, image number 3. There is a a 4 mm nodule along the left fissure, series 205, image number 7. There is a 8 mm pulmonary nodule within the superior portion of left lower lobe, series 205, image number 1. There is additional 3 mm left subpleural posterior nodule, series 205, image number 9. There is a a 6 mm subpleural right lower lobe pulmonary nodule series 205, image number 5. Mild cardiomegaly. Coronary artery calcifications. Aortic valvular calcifications. Hepatobiliary: Mild intra hepatic biliary dilatation. Multiple calcified granuloma. The gallbladder is slightly enlarged. There are multiple small stones within the gallbladder. There is mild enlargement of the extrahepatic common bile duct, measuring up to 9 mm. There is high density material or possible stones within the distal common bile duct at the head of the pancreas, series 201, image number 40 and coronal series 203, image number 68. Pancreas: Unremarkable. No pancreatic ductal dilatation or surrounding inflammatory changes. Spleen: Multiple calcified splenic granuloma. Spleen otherwise unremarkable. Adrenals/Urinary Tract: Adrenal glands are within  normal limits. There are multiple hypodense lesions within the kidneys, many of which are too small to further characterize but favor cysts. The bladder is unremarkable. Stomach/Bowel: No dilated large or small bowel. Stomach grossly  unremarkable. Appendix visualized and within normal limits. Vascular/Lymphatic: Ectatic abdominal aorta with mural thrombus and atherosclerosis. There is ectasia of the iliac arteries. No significantly enlarged pelvic or abdominal lymph nodes. Reproductive: Prostate gland is enlarged with calcifications. Other: No free air or free fluid Musculoskeletal: Degenerative changes of the spine. No suspicious osseous lesions. IMPRESSION: 1. Slightly enlarged gallbladder which contains small calcified stones. There is mild intra and extrahepatic biliary dilatation with small amount of high density material or possible stone within the distal common bile duct at the head of the pancreas. Recommend correlation with laboratory values, if obstruction is suggested, further evaluation with MR or ERCP may be obtained. 2. Multiple calcified granulomas within the liver and spleen. 3. Multiple pulmonary nodules within the bilateral lung bases. No follow-up needed if patient is low-risk (and has no known or suspected primary neoplasm). Non-contrast chest CT can be considered in 12 months if patient is high-risk. This recommendation follows the consensus statement: Guidelines for Management of Incidental Pulmonary Nodules Detected on CT Images: From the Fleischner Society 2017; Radiology 2017; 284:228-243. Electronically Signed: By: Donavan Foil M.D. On: 02/14/2016 17:10   Mr 3d Recon At Scanner  Result Date: 02/15/2016 CLINICAL DATA:  82 year old male with history of abnormal liver function tests. Possible choledocholithiasis noted on prior CT of the abdomen and pelvis. Followup study. EXAM: MRI ABDOMEN WITHOUT AND WITH CONTRAST (INCLUDING MRCP) TECHNIQUE: Multiplanar multisequence MR imaging of the  abdomen was performed both before and after the administration of intravenous contrast. Heavily T2-weighted images of the biliary and pancreatic ducts were obtained, and three-dimensional MRCP images were rendered by post processing. CONTRAST:  25mL MULTIHANCE GADOBENATE DIMEGLUMINE 529 MG/ML IV SOLN COMPARISON:  No prior abdominal MRI. CT the abdomen and pelvis 02/14/2016. FINDINGS: Lower chest: Unremarkable. Hepatobiliary: No discrete cystic or solid hepatic lesions. MRCP images demonstrate no intrahepatic biliary ductal dilatation. Common bile duct measures up to 9 mm in the porta hepatis, which is mildly enlarged, but may be within normal limits for the patient's age. However, MRCP images also demonstrate 2 filling defects within the distal common bile duct, largest of which measures 9 mm, compatible with choledocholithiasis. Gallbladder is moderately distended. Within the dependent portion of the gallbladder there is some amorphous material which is T1 isointense and less T2 hyperintense than normal bile, compatible with biliary sludge. Small signal voids within this also represent gallstones. Gallbladder wall does not appear thickened, however, there is a trace amount of pericholecystic fluid. Pancreas: In the anterior aspect of the pancreatic head there is a 6 mm T1 hypointense, T2 hyperintense, nonenhancing lesion, likely to represent a tiny pancreatic pseudocyst. No other larger pancreatic mass. No pancreatic ductal dilatation. No pancreatic or peripancreatic fluid or inflammatory changes. Spleen:  Unremarkable. Adrenals/Urinary Tract: Multiple T1 hypointense, T2 hyperintense, nonenhancing lesions associated with both kidneys, compatible with a combination of simple cysts and parapelvic cysts. No hydroureteronephrosis in the visualized portions of the abdomen. Bilateral adrenal glands are normal in appearance. Stomach/Bowel: Several colonic diverticulae are noted in the region of the descending colon.  Otherwise, unremarkable. Vascular/Lymphatic: Extensive atherosclerosis in the abdominal vasculature, without aneurysm. No lymphadenopathy noted in the abdomen. Other: No significant volume of ascites. Bilateral perinephric stranding (nonspecific). Musculoskeletal: No aggressive osseous lesions are noted in the visualized portions of the skeleton. IMPRESSION: 1. Study is positive for choledocholithiasis. Common bile duct is mildly dilated measuring 9 mm, however, this may be within normal limits given the patient's advanced age. No intrahepatic biliary ductal dilatation. There is  also some biliary sludge and small gallstones lying dependently in the gallbladder. Gallbladder wall does not appear thickened, however, there is a small volume of pericholecystic fluid. Clinical correlation for signs and symptoms of acute cholecystitis is suggested as these findings suspicious but not definitive. 2. Aortic atherosclerosis. 3. Colonic diverticulosis. 4. Additional incidental findings, as above. Electronically Signed   By: Vinnie Langton M.D.   On: 02/15/2016 12:58   Dg Ercp Biliary & Pancreatic Ducts  Result Date: 02/16/2016 CLINICAL DATA:  Cholelithiasis EXAM: ERCP TECHNIQUE: Multiple spot images obtained with the fluoroscopic device and submitted for interpretation post-procedure. FLUOROSCOPY TIME:  Fluoroscopy Time:  3 minutes and 40 seconds Radiation Exposure Index (if provided by the fluoroscopic device): Number of Acquired Spot Images: 5 COMPARISON:  None. FINDINGS: Contrast fills the biliary tree. Balloon stone retrieval is documented. IMPRESSION: See above. These images were submitted for radiologic interpretation only. Please see the procedural report for the amount of contrast and the fluoroscopy time utilized. Electronically Signed   By: Marybelle Killings M.D.   On: 02/16/2016 13:29   Mr Abdomen Mrcp Moise Boring Contast  Result Date: 02/15/2016 CLINICAL DATA:  82 year old male with history of abnormal liver function  tests. Possible choledocholithiasis noted on prior CT of the abdomen and pelvis. Followup study. EXAM: MRI ABDOMEN WITHOUT AND WITH CONTRAST (INCLUDING MRCP) TECHNIQUE: Multiplanar multisequence MR imaging of the abdomen was performed both before and after the administration of intravenous contrast. Heavily T2-weighted images of the biliary and pancreatic ducts were obtained, and three-dimensional MRCP images were rendered by post processing. CONTRAST:  29mL MULTIHANCE GADOBENATE DIMEGLUMINE 529 MG/ML IV SOLN COMPARISON:  No prior abdominal MRI. CT the abdomen and pelvis 02/14/2016. FINDINGS: Lower chest: Unremarkable. Hepatobiliary: No discrete cystic or solid hepatic lesions. MRCP images demonstrate no intrahepatic biliary ductal dilatation. Common bile duct measures up to 9 mm in the porta hepatis, which is mildly enlarged, but may be within normal limits for the patient's age. However, MRCP images also demonstrate 2 filling defects within the distal common bile duct, largest of which measures 9 mm, compatible with choledocholithiasis. Gallbladder is moderately distended. Within the dependent portion of the gallbladder there is some amorphous material which is T1 isointense and less T2 hyperintense than normal bile, compatible with biliary sludge. Small signal voids within this also represent gallstones. Gallbladder wall does not appear thickened, however, there is a trace amount of pericholecystic fluid. Pancreas: In the anterior aspect of the pancreatic head there is a 6 mm T1 hypointense, T2 hyperintense, nonenhancing lesion, likely to represent a tiny pancreatic pseudocyst. No other larger pancreatic mass. No pancreatic ductal dilatation. No pancreatic or peripancreatic fluid or inflammatory changes. Spleen:  Unremarkable. Adrenals/Urinary Tract: Multiple T1 hypointense, T2 hyperintense, nonenhancing lesions associated with both kidneys, compatible with a combination of simple cysts and parapelvic cysts. No  hydroureteronephrosis in the visualized portions of the abdomen. Bilateral adrenal glands are normal in appearance. Stomach/Bowel: Several colonic diverticulae are noted in the region of the descending colon. Otherwise, unremarkable. Vascular/Lymphatic: Extensive atherosclerosis in the abdominal vasculature, without aneurysm. No lymphadenopathy noted in the abdomen. Other: No significant volume of ascites. Bilateral perinephric stranding (nonspecific). Musculoskeletal: No aggressive osseous lesions are noted in the visualized portions of the skeleton. IMPRESSION: 1. Study is positive for choledocholithiasis. Common bile duct is mildly dilated measuring 9 mm, however, this may be within normal limits given the patient's advanced age. No intrahepatic biliary ductal dilatation. There is also some biliary sludge and small gallstones lying dependently in  the gallbladder. Gallbladder wall does not appear thickened, however, there is a small volume of pericholecystic fluid. Clinical correlation for signs and symptoms of acute cholecystitis is suggested as these findings suspicious but not definitive. 2. Aortic atherosclerosis. 3. Colonic diverticulosis. 4. Additional incidental findings, as above. Electronically Signed   By: Vinnie Langton M.D.   On: 02/15/2016 12:58   US Abdomen Limited Ruq  Result Date: 02/14/2016 CLINICAL DATA:  Right upper quadrant pain for 1 day. EXAM: US ABDOMEN LIMITED - RIGHT UPPER QUADRANT COMPARISON:  CT abdomen and pelvis 02/14/2016 FINDINGS: Gallbladder: The gallbladder wall thickening (or combination of gallbladder wall and trace pericholecystic fluid) measures up to 7 mm in thickness. Small amount of gallbladder sludge. No sonographic Murphy sign noted by sonographer. Common bile duct: Diameter: 8 mm.  Distal portion not well visualized. Liver: Mild intrahepatic biliary dilatation. No focal liver lesion identified, however the left lobe was suboptimally visualized due to bowel gas.  IMPRESSION: 1. Small amount of gallbladder sludge with mild gallbladder wall thickening (and/or trace pericholecystic fluid). Acute cholecystitis is not excluded in the appropriate clinical setting, however sonographic Murphy sign was negative. 2. Mild intrahepatic and borderline extrahepatic biliary dilatation. The distal common bile duct was not well seen. MRCP or ERCP could be considered to further evaluate the possible distal common duct stone or other debris described on CT. Electronically Signed   By: Logan Bores M.D.   On: 02/14/2016 18:06    Lab Data:  CBC:  Recent Labs Lab 02/14/16 1425 02/15/16 0513 02/16/16 0348 02/17/16 0401  WBC 14.2* 14.0* 12.1* 8.6  NEUTROABS  --  12.4* 10.1* 6.5  HGB 15.2 13.4 13.6 12.5*  HCT 44.4 39.2 39.1 36.4*  MCV 93.9 93.8 92.7 92.4  PLT 163 154 131* [id number]*   Basic Metabolic Panel:  Recent Labs Lab 02/15/16 0513 02/16/16 0348 02/17/16 0401 02/18/16 1037 02/19/16 0437  NA 137 137 137 133* 138  K 4.8 3.6 3.1* 6.2* 3.1*  CL 106 104 104 104 107  CO2 22 21* 25 19* 22  GLUCOSE 131* 118* 90 114* 97  BUN 16 16 8 8 6   CREATININE 1.55* 1.54* 1.28* 1.02 0.95  CALCIUM 9.0 8.7* 7.9* 8.2* 8.2*   GFR: Estimated Creatinine Clearance: 42.9 mL/min (by C-G formula based on SCr of 0.95 mg/dL). Liver Function Tests:  Recent Labs Lab 02/15/16 0513 02/16/16 0348 02/17/16 0401 02/18/16 1037 02/19/16 0437  AST 1,149* 427* 155* 135* 65*  ALT 823* 555* 328* 276* 184*  ALKPHOS 170* 162* 137* 163* 141*  BILITOT 4.3* 4.7* 2.7* 3.5* 2.0*  PROT 6.6 6.8 5.9* 6.6 5.8*  ALBUMIN 3.0* 3.2* 2.6* 2.8* 2.4*    Recent Labs Lab 02/14/16 1425 02/17/16 0401 02/18/16 1037  LIPASE 51 99* 38   No results for input(s): AMMONIA in the last 168 hours. Coagulation Profile:  Recent Labs Lab 02/15/16 0513  INR 1.20   Cardiac Enzymes: No results for input(s): CKTOTAL, CKMB, CKMBINDEX, TROPONINI in the last 168 hours. BNP (last 3 results) No results for  input(s): PROBNP in the last 8760 hours. HbA1C: No results for input(s): HGBA1C in the last 72 hours. CBG:  Recent Labs Lab 02/18/16 1632 02/19/16 0001 02/19/16 0554 02/19/16 0808 02/19/16 1151  GLUCAP 153* 121* 120* 89 118*   Lipid Profile: No results for input(s): CHOL, HDL, LDLCALC, TRIG, CHOLHDL, LDLDIRECT in the last 72 hours. Thyroid Function Tests: No results for input(s): TSH, T4TOTAL, FREET4, T3FREE, THYROIDAB in the last 72 hours. Anemia Panel: No  results for input(s): VITAMINB12, FOLATE, FERRITIN, TIBC, IRON, RETICCTPCT in the last 72 hours. Urine analysis:    Component Value Date/Time   COLORURINE YELLOW 02/14/2016 1506   APPEARANCEUR CLEAR 02/14/2016 1506   LABSPEC 1.018 02/14/2016 1506   PHURINE 6.5 02/14/2016 1506   GLUCOSEU NEGATIVE 02/14/2016 1506   HGBUR NEGATIVE 02/14/2016 1506   BILIRUBINUR NEGATIVE 02/14/2016 1506   BILIRUBINUR n 07/25/2015 1236   KETONESUR 40 (A) 02/14/2016 1506   PROTEINUR NEGATIVE 02/14/2016 1506   UROBILINOGEN 0.2 07/25/2015 1236   UROBILINOGEN 1.0 12/22/2013 0341   NITRITE NEGATIVE 02/14/2016 1506   LEUKOCYTESUR NEGATIVE 02/14/2016 1506     Juwann Sherk M.D. [facility] 02/19/2016, 3:18 PM  Pager: [phone] Between 7am to 7pm - call Pager - [phone]  After 7pm go to [email or web address] - password [email or web address]  Call night coverage person covering after 7pm

## 2016-02-19 NOTE — Evaluation (Addendum)
Occupational Therapy Evaluation Patient Details Name: Allen Wheeler MRN: [id number] DOB: 1919/12/25 Today's Date: 02/19/2016    History of Present Illness 82 y.o. male with medical history significant of seasonal allergies, anxiety, depression, GERD, nephrolithiasis, osteoarthritis Who presented with one-day history of right upper quadrant abdominal pain. Now s/p LAPAROSCOPIC CHOLECYSTECTOMY    Clinical Impression   Pt admitted with the above diagnoses and presents with below problem list. Pt will benefit from continued acute OT to address the below listed deficits and maximize independence with basic ADLs prior to d/c to venue below. PTA pt was independent with ADLs. Pt is currently supervision to min guard with LB ADLs and functional mobility. Daughter-in-law present and included in education. Pt will be home alone after 1 day with family assisting then will have intermittent help from neighbors. Family has arranged for someone to check in on pt once a day. Pt would benefit from PT consult.      Follow Up Recommendations  Home health OT;Supervision - Intermittent    Equipment Recommendations  None recommended by OT    Recommendations for Other Services PT consult     Precautions / Restrictions Precautions Precaution Comments: abdominal wound with ports Restrictions Weight Bearing Restrictions: No      Mobility Bed Mobility Overal bed mobility: Needs Assistance Bed Mobility: Supine to Sit     Supine to sit: Modified independent (Device/Increase time)        Transfers Overall transfer level: Needs assistance Equipment used: None;Rolling walker (2 wheeled) Transfers: Sit to/from Stand Sit to Stand: Supervision         General transfer comment: from EOB and recliner, supervision for safety    Balance Overall balance assessment: Needs assistance Sitting-balance support: No upper extremity supported;Feet supported Sitting balance-Leahy Scale: Good      Standing balance support: No upper extremity supported;During functional activity Standing balance-Leahy Scale: Fair                              ADL Overall ADL's : Needs assistance/impaired Eating/Feeding: Set up;Sitting   Grooming: Set up;Min guard;Sitting;Standing   Upper Body Bathing: Set up;Supervision/ safety;Sitting   Lower Body Bathing: With adaptive equipment;Sit to/from stand;Supervison/ safety   Upper Body Dressing : Set up;Sitting   Lower Body Dressing: With adaptive equipment;Sit to/from stand;Supervision/safety   Toilet Transfer: Min guard;Ambulation;RW;Comfort height toilet;Grab bars   Toileting- Clothing Manipulation and Hygiene: Supervision/safety;Sit to/from stand   Tub/ Shower Transfer: Min guard;Walk-in shower;Ambulation;Shower seat   Functional mobility during ADLs: Min guard General ADL Comments: Pt completed in-room functional mobility. Tangential responses at times. Daughter-in-law present discussed strategies and AE/DME for ADLs. Pt noted to ambulate at increased speed for age which family reports is normal for pt. Pt with decreased awareness of IV line and not adjusting speed accordingly.      Vision Additional Comments: Pt reports recent cataract surgery was successful.    Perception     Praxis      Pertinent Vitals/Pain Pain Assessment: Faces Faces Pain Scale: Hurts little more Pain Location: abdomen Pain Descriptors / Indicators: Grimacing Pain Intervention(s): Limited activity within patient's tolerance;Monitored during session;Repositioned     Hand Dominance Right   Extremity/Trunk Assessment Upper Extremity Assessment Upper Extremity Assessment: Generalized weakness; Noted earlier reports of symptoms consistent with radial nerve palsy in LUE. This appears to have resolved.    Lower Extremity Assessment Lower Extremity Assessment: Defer to PT evaluation  Communication Communication Communication: HOH;Other  (comment) ("hearing aids don't work")   Cognition Arousal/Alertness: Awake/alert Behavior During Therapy: WFL for tasks assessed/performed;Impulsive Overall Cognitive Status:  (Daughter-in-law present, appears baseline with cognition)                     General Comments       Exercises       Shoulder Instructions      Home Living Family/patient expects to be discharged to:: Private residence Living Arrangements: Alone Available Help at Discharge: Friend(s);Available PRN/intermittently Type of Home: House Home Access: Stairs to enter Entrance Stairs-Number of Steps: 1   Home Layout: Two level;Able to live on main level with bedroom/bathroom     Bathroom Shower/Tub: Walk-in shower         Home Equipment: Grab bars - tub/shower;Cane - single point;Walker - 4 wheels;Shower seat          Prior Functioning/Environment Level of Independence: Independent                 OT Problem List: Impaired balance (sitting and/or standing);Decreased cognition;Decreased knowledge of use of DME or AE;Decreased knowledge of precautions;Pain   OT Treatment/Interventions: Self-care/ADL training;DME and/or AE instruction;Therapeutic activities;Patient/family education;Balance training    OT Goals(Current goals can be found in the care plan section) Acute Rehab OT Goals Patient Stated Goal: home when ready OT Goal Formulation: With patient/family Time For Goal Achievement: 03/04/16 Potential to Achieve Goals: Good ADL Goals Pt Will Perform Lower Body Bathing: with modified independence;sit to/from stand;with adaptive equipment Pt Will Perform Lower Body Dressing: with modified independence;sit to/from stand;with adaptive equipment Pt Will Perform Tub/Shower Transfer: Shower transfer;with modified independence;ambulating;shower seat  OT Frequency: Min 2X/week   Barriers to D/C: Decreased caregiver support  daughter to stay with him until afternoon tomorrow. then  intermittent help from neighbors/friends.        Co-evaluation              End of Session    Activity Tolerance: Patient tolerated treatment well Patient left: in chair;with call bell/phone within reach;with family/visitor present   Time: [id number] OT Time Calculation (min): 31 min Charges:  OT General Charges $OT Visit: 1 Procedure OT Evaluation $OT Eval Low Complexity: 1 Procedure OT Treatments $Self Care/Home Management : 8-22 mins G-Codes:    Hortencia Pilar 2016/02/26, 3:00 PM

## 2016-02-20 LAB — GLUCOSE, CAPILLARY
GLUCOSE-CAPILLARY: 92 mg/dL (ref 65–99)
Glucose-Capillary: 93 mg/dL (ref 65–99)

## 2016-02-20 LAB — COMPREHENSIVE METABOLIC PANEL
ALT: 155 U/L — AB (ref 17–63)
AST: 50 U/L — AB (ref 15–41)
Albumin: 2.6 g/dL — ABNORMAL LOW (ref 3.5–5.0)
Alkaline Phosphatase: 157 U/L — ABNORMAL HIGH (ref 38–126)
Anion gap: 8 (ref 5–15)
BILIRUBIN TOTAL: 1.8 mg/dL — AB (ref 0.3–1.2)
BUN: 11 mg/dL (ref 6–20)
CHLORIDE: 105 mmol/L (ref 101–111)
CO2: 22 mmol/L (ref 22–32)
CREATININE: 1.08 mg/dL (ref 0.61–1.24)
Calcium: 8.5 mg/dL — ABNORMAL LOW (ref 8.9–10.3)
GFR, EST NON AFRICAN AMERICAN: 57 mL/min — AB (ref >60)
Glucose, Bld: 104 mg/dL — ABNORMAL HIGH (ref 65–99)
POTASSIUM: 3.2 mmol/L — AB (ref 3.5–5.1)
Sodium: 135 mmol/L (ref 135–145)
TOTAL PROTEIN: 5.9 g/dL — AB (ref 6.5–8.1)

## 2016-02-20 LAB — MAGNESIUM: MAGNESIUM: 1.7 mg/dL (ref 1.7–2.4)

## 2016-02-20 MED ORDER — POTASSIUM CHLORIDE CRYS ER 20 MEQ PO TBCR
40.0000 meq | EXTENDED_RELEASE_TABLET | Freq: Once | ORAL | Status: AC
  Administered 2016-02-20: 40 meq via ORAL
  Filled 2016-02-20: qty 2

## 2016-02-20 MED ORDER — PSYLLIUM 95 % PO PACK: 1.0000 | PACK | Freq: Every day | ORAL | Status: DC | PRN

## 2016-02-20 MED ORDER — MAGNESIUM OXIDE 400 (241.3 MG) MG PO TABS
400.0000 mg | ORAL_TABLET | Freq: Every day | ORAL | Status: DC
  Administered 2016-02-20: 400 mg via ORAL
  Filled 2016-02-20: qty 1

## 2016-02-20 MED ORDER — PSYLLIUM 95 % PO PACK
1.0000 | PACK | Freq: Once | ORAL | Status: AC
  Administered 2016-02-20: 1 via ORAL
  Filled 2016-02-20: qty 1

## 2016-02-20 MED ORDER — SIMETHICONE 80 MG PO CHEW
80.0000 mg | CHEWABLE_TABLET | Freq: Three times a day (TID) | ORAL | Status: DC
  Administered 2016-02-20: 80 mg via ORAL
  Filled 2016-02-20: qty 1

## 2016-02-20 MED ORDER — AZELASTINE HCL 0.1 % NA SOLN
2.0000 | Freq: Two times a day (BID) | NASAL | Status: DC
  Administered 2016-02-20: 2 via NASAL
  Filled 2016-02-20: qty 30

## 2016-02-20 MED ORDER — POLYETHYLENE GLYCOL 3350 17 G PO PACK
17.0000 g | PACK | Freq: Once | ORAL | Status: AC
  Administered 2016-02-20: 17 g via ORAL
  Filled 2016-02-20: qty 1

## 2016-02-20 NOTE — Plan of Care (Signed)
I was called by the RN that patient's daughter in law wanted to cancel discharge as he did not feel well. I examined the patient, he is alert, oriented x3. No neuro deficits.  Lungs clear: CVS: S1S2 clear Lungs: CTAB Abd : soft, no tenderness : Neuro: No neuro deficits, strength 5/5 upper and lower ext 5/5 B/L  BP 140/59.  Patient states he felt dizzy and 'sick' after the "big" BM, likely vasovagal episode.  - will give 500cc IV fluid fluid bolus over 1 hour.   Update son, [name]. No change in plans. DC later in evening     Mikhaela Zaugg M.D. [facility] 02/20/2016, 4:02 PM  Pager: [phone]

## 2016-02-20 NOTE — Progress Notes (Signed)
Pt discharged home in stable condition. Went over discharge instructions with pt verbalizing understanding the instructions.AVS and discharge meds script given before discharge

## 2016-02-20 NOTE — Evaluation (Signed)
Physical Therapy Evaluation Patient Details Name: Allen Wheeler MRN: [id number] DOB: 17-Sep-1919 Today's Date: 02/20/2016   History of Present Illness  82 y.o. male with medical history significant of seasonal allergies, anxiety, depression, GERD, nephrolithiasis, osteoarthritis Who presented with one-day history of right upper quadrant abdominal pain. Now s/p LAPAROSCOPIC CHOLECYSTECTOMY   Clinical Impression  Pt presents in bed when pt arrives and is agreeable to therapy evaluation. Performed bed mobs with Mod I, transfers with supervision and gait without an AD with supervision. Pt is deconditioned and reports increased fatigue since surgery. Pt will benefit from continued acute therapy services to address the below deficits in order to assist with safe transition home. Pt was completely independent prior to admission and lived alone but will have family assistance upon dc.     Follow Up Recommendations Home health PT    Equipment Recommendations  None recommended by PT    Recommendations for Other Services       Precautions / Restrictions Precautions Precaution Comments: abdominal wound with ports Restrictions Weight Bearing Restrictions: No      Mobility  Bed Mobility Overal bed mobility: Modified Independent Bed Mobility: Supine to Sit     Supine to sit: Modified independent (Device/Increase time)        Transfers Overall transfer level: Needs assistance Equipment used: None;Rolling walker (2 wheeled) Transfers: Sit to/from Stand Sit to Stand: Supervision         General transfer comment: from EOB and recliner. Pt requires supervision  Ambulation/Gait Ambulation/Gait assistance: Supervision Ambulation Distance (Feet): 100 Feet Assistive device: None Gait Pattern/deviations: Step-through pattern Gait velocity: decreased Gait velocity interpretation: <1.8 ft/sec, indicative of risk for recurrent falls General Gait Details: decreased cadence  Stairs             Wheelchair Mobility    Modified Rankin (Stroke Patients Only)       Balance Overall balance assessment: Needs assistance Sitting-balance support: No upper extremity supported Sitting balance-Leahy Scale: Good     Standing balance support: No upper extremity supported Standing balance-Leahy Scale: Good Standing balance comment: able to use bathroom, brush hair in standing without ue support                             Pertinent Vitals/Pain Pain Assessment: 0-10 Pain Score: 5  Pain Location: andomen Pain Descriptors / Indicators: Sore Pain Intervention(s): Monitored during session;Repositioned    Home Living Family/patient expects to be discharged to:: Private residence Living Arrangements: Alone Available Help at Discharge: Friend(s);Available PRN/intermittently Type of Home: House Home Access: Stairs to enter   Entrance Stairs-Number of Steps: 1 Home Layout: Two level;Able to live on main level with bedroom/bathroom Home Equipment: Grab bars - tub/shower;Cane - single point;Walker - 4 wheels;Shower seat      Prior Function Level of Independence: Independent               Hand Dominance   Dominant Hand: Right    Extremity/Trunk Assessment   Upper Extremity Assessment: Defer to OT evaluation           Lower Extremity Assessment: Generalized weakness         Communication   Communication: HOH;Other (comment) (hearing aids aren't wokring per client)  Cognition Arousal/Alertness: Awake/alert Behavior During Therapy: WFL for tasks assessed/performed;Impulsive Overall Cognitive Status: Within Functional Limits for tasks assessed  General Comments      Exercises     Assessment/Plan    PT Assessment Patient needs continued PT services  PT Problem List Decreased strength;Decreased activity tolerance;Decreased mobility          PT Treatment Interventions Gait training;Stair  training;Functional mobility training;Therapeutic activities;Therapeutic exercise;Balance training;Patient/family education    PT Goals (Current goals can be found in the Care Plan section)  Acute Rehab PT Goals Patient Stated Goal: to get back home  PT Goal Formulation: With patient Time For Goal Achievement: 02/27/16 Potential to Achieve Goals: Good    Frequency Min 3X/week   Barriers to discharge        Co-evaluation               End of Session Equipment Utilized During Treatment: Gait belt Activity Tolerance: Patient tolerated treatment well Patient left: in chair;with call bell/phone within reach;with family/visitor present Nurse Communication: Mobility status         Time: [id number] PT Time Calculation (min) (ACUTE ONLY): 28 min   Charges:   PT Evaluation $PT Eval Low Complexity: 1 Procedure PT Treatments $Gait Training: 8-22 mins   PT G Codes:        Scheryl Marten PT, DPT  [phone]  02/20/2016, 9:09 AM

## 2016-02-20 NOTE — Progress Notes (Signed)
Patient ID: Allen Wheeler, male   DOB: 1920-01-23, 82 y.o.   MRN: [id number]  [facility] Surgery Progress Note  2 Days Post-Op  Subjective: Had BM this morning which relieved his upper abdominal pain, now having some lower abdominal pain. Patient thinks this may be related to gas pains. Has been ambulating. Hopes to go home this afternoon.  Objective: Vital signs in last 24 hours: Temp:  [98 F (36.7 C)-99.9 F (37.7 C)] 98 F (36.7 C) (11/13 0414) Pulse Rate:  [60-62] 62 (11/13 0414) Resp:  [19] 19 (11/13 0414) BP: (138-144)/(67-77) 138/77 (11/13 0414) SpO2:  [99 %] 99 % (11/13 0414) Last BM Date: 02/13/16  Intake/Output from previous day: 11/12 0701 - 11/13 0700 In: 360 [P.O.:360] Out: 750 [Urine:750] Intake/Output this shift: No intake/output data recorded.  PE: Gen:  Alert, NAD, pleasant Pulm: effort normal and lower Abd: Soft, ND, mild tenderness lower abdomen, +BS, incisions C/D/I  Lab Results:  No results for input(s): WBC, HGB, HCT, PLT in the last 72 hours. BMET  Recent Labs  02/19/16 0437 02/20/16 0316  NA 138 135  K 3.1* 3.2*  CL 107 105  CO2 22 22  GLUCOSE 97 104*  BUN 6 11  CREATININE 0.95 1.08  CALCIUM 8.2* 8.5*   PT/INR No results for input(s): LABPROT, INR in the last 72 hours. CMP     Component Value Date/Time   NA 135 02/20/2016 0316   K 3.2 (L) 02/20/2016 0316   CL 105 02/20/2016 0316   CO2 22 02/20/2016 0316   GLUCOSE 104 (H) 02/20/2016 0316   BUN 11 02/20/2016 0316   CREATININE 1.08 02/20/2016 0316   CALCIUM 8.5 (L) 02/20/2016 0316   PROT 5.9 (L) 02/20/2016 0316   ALBUMIN 2.6 (L) 02/20/2016 0316   AST 50 (H) 02/20/2016 0316   ALT 155 (H) 02/20/2016 0316   ALKPHOS 157 (H) 02/20/2016 0316   BILITOT 1.8 (H) 02/20/2016 0316   GFRNONAA 57 (L) 02/20/2016 0316   GFRAA >60 02/20/2016 0316   Lipase     Component Value Date/Time   LIPASE 38 02/18/2016 1037       Studies/Results: No results  found.  Anti-infectives: Anti-infectives    Start     Dose/Rate Route Frequency Ordered Stop   02/18/16 0600  ceFAZolin (ANCEF) IVPB 2g/100 mL premix  Status:  Discontinued     2 g 200 mL/hr over 30 Minutes Intravenous To Short Stay 02/17/16 1459 02/18/16 1037   02/17/16 0600  ceFAZolin (ANCEF) IVPB 2g/100 mL premix  Status:  Discontinued     2 g 200 mL/hr over 30 Minutes Intravenous To Short Stay 02/16/16 1606 02/17/16 1459   02/14/16 2100  piperacillin-tazobactam (ZOSYN) IVPB 3.375 g  Status:  Discontinued     3.375 g 12.5 mL/hr over 240 Minutes Intravenous Every 8 hours 02/14/16 2051 02/18/16 1049       Assessment/Plan Cholelithiasis with choledocholithiasis. S/p ERCP/sphincterotomy 02/16/16, Dr. Teena Irani S/p Procedure(s): LAPAROSCOPIC CHOLECYSTECTOMY 02/18/16 Dr. Kieth Brightly - POD 2 - bilirubin still elevated but trending down 1.8 today from 2.0 - had BM this Am. Less distended, but does continues to have some lower abdominal discomfort  FEN - regular diet VTE - heparin, SCDs  Plan - + flatus and BM. Some lower abdominal pain patient relates to gas pains; will add simethicone. patient hopes to go home this afternoon if pain improves, will per medicine. Continue to mobilize. If this resolves ok from surgical standpoint for discharge. Will need to follow-up with  Dr. Kieth Brightly in 2-3 weeks   LOS: 6 days    Jerrye Beavers , [facility] 02/20/2016, 1:22 PM Pager: [phone] Consults: [phone] Mon-Fri 7:00 am-4:30 pm Sat-Sun 7:00 am-11:30 am

## 2016-02-20 NOTE — Progress Notes (Signed)
Pt and his daughter requested that nurse let the MD know that pt is feeling very gasy and is having second thoughts about going home today. Simethicone administered as ordered. Will monitor condition and notify MD if symptoms persist

## 2016-02-20 NOTE — Progress Notes (Signed)
Pt's daughter in law expressing concerns about discharging pt tonight as she has to be out of town tonight. She worries about his lethargy at home by himself as her husband is also out of town and they have no one else to watch him. MD text paged above message

## 2016-02-20 NOTE — Care Management Important Message (Signed)
Important Message  Patient Details  Name: Coal Shideler MRN: [id number] Date of Birth: 1919-10-10   Medicare Important Message Given:  Yes    Leonda Cristo 02/20/2016, 12:00 PM

## 2016-02-20 NOTE — Plan of Care (Signed)
Problem: Health Behavior/Discharge Planning: Goal: Ability to manage health-related needs will improve Outcome: Progressing Daughter in law at bedside  Problem: Nutrition: Goal: Adequate nutrition will be maintained Outcome: Progressing Dietary intake average  Problem: Bowel/Gastric: Goal: Will not experience complications related to bowel motility Outcome: Progressing Pt had a bowel movement today

## 2016-02-20 NOTE — Progress Notes (Signed)
Pt's daughter is asking to cancel discharge today, says father is very lethargic.MD text paged and waiting for response

## 2016-02-20 NOTE — Progress Notes (Signed)
Occupational Therapy Treatment Patient Details Name: Allen Wheeler MRN: [id number] DOB: [birth year] Today's Date: 02/20/2016    History of present illness 82 y.o. male with medical history significant of seasonal allergies, anxiety, depression, GERD, nephrolithiasis, osteoarthritis Who presented with one-day history of right upper quadrant abdominal pain. Now s/p LAPAROSCOPIC CHOLECYSTECTOMY    OT comments  Pt progressing well toward OT goals. Instructed pt concerning safe shower transfers and safe use of DME. Also educated pt and family concerning use of AE for LB ADL, energy conservation, and fall prevention at home. They verbalize and demonstrate understanding. Pt states that he does not want to use the AE and was able to demonstrate completion of LB ADL with supervision without AE. Pt would continue to benefit from OT services while admitted to improve independence with ADL in anticipation for safe D/C home with home health services. OT will continue to follow acutely.    Follow Up Recommendations  Home health OT;Supervision - Intermittent    Equipment Recommendations  None recommended by OT       Precautions / Restrictions Precautions Precautions: Fall Precaution Comments: abdominal wound with ports Restrictions Weight Bearing Restrictions: No       Mobility Bed Mobility Overal bed mobility: Modified Independent                Transfers Overall transfer level: Needs assistance Equipment used: None Transfers: Sit to/from Stand Sit to Stand: Supervision              Balance Overall balance assessment: Needs assistance Sitting-balance support: No upper extremity supported;Feet supported Sitting balance-Leahy Scale: Good     Standing balance support: No upper extremity supported;During functional activity Standing balance-Leahy Scale: Good Standing balance comment: Able to complete shower transfer without UE support.                   ADL Overall  ADL's : Needs assistance/impaired     Grooming: Supervision/safety;Standing               Lower Body Dressing: Supervision/safety;Sit to/from stand Lower Body Dressing Details (indicate cue type and reason): Discussed use of AE for LB ADL and pt able to complete without AE and not interested in purchasing. Toilet Transfer: Supervision/safety;Ambulation;BSC   Toileting- Water quality scientist and Hygiene: Supervision/safety;Sit to/from stand   Tub/ Shower Transfer: Supervision/safety;Walk-in shower;Ambulation;Shower seat   Functional mobility during ADLs: Supervision/safety General ADL Comments: Pt with improved awareness of limitations this session. Educated on safe shower transfer and use of AE for LB ADL. Pt practiced with AE and decided he did not want to use these at home.                Cognition   Behavior During Therapy: [facility] for tasks assessed/performed;Impulsive Overall Cognitive Status: Within Functional Limits for tasks assessed                                    Pertinent Vitals/ Pain       Pain Assessment: Faces Faces Pain Scale: Hurts little more Pain Location: abdomen Pain Descriptors / Indicators: Sore Pain Intervention(s): Limited activity within patient's tolerance;Repositioned         Frequency  Min 2X/week        Progress Toward Goals  OT Goals(current goals can now be found in the care plan section)  Progress towards OT goals: Progressing toward goals  Acute Rehab OT Goals Patient Stated  Goal: to get back home  OT Goal Formulation: With patient/family Time For Goal Achievement: 03/04/16 Potential to Achieve Goals: Good ADL Goals Pt Will Perform Lower Body Bathing: with modified independence;sit to/from stand;with adaptive equipment Pt Will Perform Lower Body Dressing: with modified independence;sit to/from stand;with adaptive equipment Pt Will Perform Tub/Shower Transfer: Shower transfer;with modified  independence;ambulating;shower seat  Plan Discharge plan remains appropriate       End of Session Equipment Utilized During Treatment: Gait belt   Activity Tolerance Patient tolerated treatment well   Patient Left in bed;with call bell/phone within reach;with family/visitor present           Time: 1232-1249 OT Time Calculation (min): 17 min  Charges: OT General Charges $OT Visit: 1 Procedure OT Treatments $Self Care/Home Management : 8-22 mins  Norman Herrlich, OTR/L [phone] 02/20/2016, 3:25 PM

## 2016-02-20 NOTE — Care Management Note (Signed)
Case Management Note  Patient Details  Name: Allen Wheeler MRN: [id number] Date of Birth: December 28, 1919  Subjective/Objective:                    Action/Plan:  Confirmed face sheet information with patient and daughter at bedside. Patient prefers home phone number over cell phone. Referral given to Santiago Glad with [facility] and she is aware. Expected Discharge Date:                  Expected Discharge Plan:  [place]  In-House Referral:     Discharge planning Services  CM Consult  Post Acute Care Choice:  Home Health Choice offered to:  Patient, Adult Children  DME Arranged:    DME Agency:     HH Arranged:  PT, OT HH Agency:  [place]  Status of Service:  Completed, signed off  If discussed at [place] of Stay Meetings, dates discussed:    Additional Comments:  Marilu Favre, RN 02/20/2016, 10:08 AM

## 2016-02-20 NOTE — Progress Notes (Signed)
Pt reports a large bowel movement this morning after receiving miralax,metamucil and senna. Tap water enema not administered at this time

## 2016-02-22 DIAGNOSIS — Z9181 History of falling: Secondary | ICD-10-CM | POA: Diagnosis not present

## 2016-02-22 DIAGNOSIS — E039 Hypothyroidism, unspecified: Secondary | ICD-10-CM | POA: Diagnosis not present

## 2016-02-22 DIAGNOSIS — F419 Anxiety disorder, unspecified: Secondary | ICD-10-CM | POA: Diagnosis not present

## 2016-02-22 DIAGNOSIS — K219 Gastro-esophageal reflux disease without esophagitis: Secondary | ICD-10-CM | POA: Diagnosis not present

## 2016-02-22 DIAGNOSIS — F329 Major depressive disorder, single episode, unspecified: Secondary | ICD-10-CM | POA: Diagnosis not present

## 2016-02-22 DIAGNOSIS — Z48815 Encounter for surgical aftercare following surgery on the digestive system: Secondary | ICD-10-CM | POA: Diagnosis not present

## 2016-02-22 DIAGNOSIS — H9190 Unspecified hearing loss, unspecified ear: Secondary | ICD-10-CM | POA: Diagnosis not present

## 2016-02-22 DIAGNOSIS — M1991 Primary osteoarthritis, unspecified site: Secondary | ICD-10-CM | POA: Diagnosis not present

## 2016-02-22 DIAGNOSIS — Z87891 Personal history of nicotine dependence: Secondary | ICD-10-CM | POA: Diagnosis not present

## 2016-02-23 ENCOUNTER — Other Ambulatory Visit: Payer: Self-pay | Admitting: Family Medicine

## 2016-02-23 DIAGNOSIS — Z48815 Encounter for surgical aftercare following surgery on the digestive system: Secondary | ICD-10-CM | POA: Diagnosis not present

## 2016-02-23 DIAGNOSIS — F329 Major depressive disorder, single episode, unspecified: Secondary | ICD-10-CM | POA: Diagnosis not present

## 2016-02-23 DIAGNOSIS — K219 Gastro-esophageal reflux disease without esophagitis: Secondary | ICD-10-CM | POA: Diagnosis not present

## 2016-02-23 DIAGNOSIS — M1991 Primary osteoarthritis, unspecified site: Secondary | ICD-10-CM | POA: Diagnosis not present

## 2016-02-23 DIAGNOSIS — E039 Hypothyroidism, unspecified: Secondary | ICD-10-CM | POA: Diagnosis not present

## 2016-02-23 DIAGNOSIS — F419 Anxiety disorder, unspecified: Secondary | ICD-10-CM | POA: Diagnosis not present

## 2016-02-24 DIAGNOSIS — M1991 Primary osteoarthritis, unspecified site: Secondary | ICD-10-CM | POA: Diagnosis not present

## 2016-02-24 DIAGNOSIS — F329 Major depressive disorder, single episode, unspecified: Secondary | ICD-10-CM | POA: Diagnosis not present

## 2016-02-24 DIAGNOSIS — F419 Anxiety disorder, unspecified: Secondary | ICD-10-CM | POA: Diagnosis not present

## 2016-02-24 DIAGNOSIS — Z48815 Encounter for surgical aftercare following surgery on the digestive system: Secondary | ICD-10-CM | POA: Diagnosis not present

## 2016-02-24 DIAGNOSIS — E039 Hypothyroidism, unspecified: Secondary | ICD-10-CM | POA: Diagnosis not present

## 2016-02-24 DIAGNOSIS — K219 Gastro-esophageal reflux disease without esophagitis: Secondary | ICD-10-CM | POA: Diagnosis not present

## 2016-02-27 ENCOUNTER — Other Ambulatory Visit: Payer: Self-pay | Admitting: Family Medicine

## 2016-02-28 DIAGNOSIS — Z48815 Encounter for surgical aftercare following surgery on the digestive system: Secondary | ICD-10-CM | POA: Diagnosis not present

## 2016-02-28 DIAGNOSIS — F419 Anxiety disorder, unspecified: Secondary | ICD-10-CM | POA: Diagnosis not present

## 2016-02-28 DIAGNOSIS — K219 Gastro-esophageal reflux disease without esophagitis: Secondary | ICD-10-CM | POA: Diagnosis not present

## 2016-02-28 DIAGNOSIS — F329 Major depressive disorder, single episode, unspecified: Secondary | ICD-10-CM | POA: Diagnosis not present

## 2016-02-28 DIAGNOSIS — M1991 Primary osteoarthritis, unspecified site: Secondary | ICD-10-CM | POA: Diagnosis not present

## 2016-02-28 DIAGNOSIS — E039 Hypothyroidism, unspecified: Secondary | ICD-10-CM | POA: Diagnosis not present

## 2016-03-06 ENCOUNTER — Ambulatory Visit (INDEPENDENT_AMBULATORY_CARE_PROVIDER_SITE_OTHER): Payer: Medicare Other | Admitting: Family Medicine

## 2016-03-06 ENCOUNTER — Encounter: Payer: Self-pay | Admitting: Family Medicine

## 2016-03-06 VITALS — BP 124/78 | HR 60 | Temp 97.4°F | Wt 142.4 lb

## 2016-03-06 DIAGNOSIS — F329 Major depressive disorder, single episode, unspecified: Secondary | ICD-10-CM | POA: Diagnosis not present

## 2016-03-06 DIAGNOSIS — K8021 Calculus of gallbladder without cholecystitis with obstruction: Secondary | ICD-10-CM

## 2016-03-06 DIAGNOSIS — K219 Gastro-esophageal reflux disease without esophagitis: Secondary | ICD-10-CM | POA: Diagnosis not present

## 2016-03-06 DIAGNOSIS — Z48815 Encounter for surgical aftercare following surgery on the digestive system: Secondary | ICD-10-CM | POA: Diagnosis not present

## 2016-03-06 DIAGNOSIS — E039 Hypothyroidism, unspecified: Secondary | ICD-10-CM | POA: Diagnosis not present

## 2016-03-06 DIAGNOSIS — F419 Anxiety disorder, unspecified: Secondary | ICD-10-CM | POA: Diagnosis not present

## 2016-03-06 DIAGNOSIS — K8309 Other cholangitis: Principal | ICD-10-CM

## 2016-03-06 DIAGNOSIS — K830 Cholangitis: Secondary | ICD-10-CM

## 2016-03-06 DIAGNOSIS — M1991 Primary osteoarthritis, unspecified site: Secondary | ICD-10-CM | POA: Diagnosis not present

## 2016-03-06 LAB — BASIC METABOLIC PANEL
BUN: 20 mg/dL (ref 6–23)
CALCIUM: 9.1 mg/dL (ref 8.4–10.5)
CO2: 26 mEq/L (ref 19–32)
CREATININE: 1.11 mg/dL (ref 0.40–1.50)
Chloride: 103 mEq/L (ref 96–112)
GFR: 65.55 mL/min (ref >60.00)
Glucose, Bld: 81 mg/dL (ref 70–99)
Potassium: 4.2 mEq/L (ref 3.5–5.1)
Sodium: 137 mEq/L (ref 135–145)

## 2016-03-06 NOTE — Progress Notes (Signed)
Pre visit review using our clinic review tool, if applicable. No additional management support is needed unless otherwise documented below in the visit note. 

## 2016-03-06 NOTE — Progress Notes (Signed)
   Subjective:    Patient ID: Allen Wheeler, male    DOB: 1919-07-19, 82 y.o.   MRN: [id number]  HPI Here to follow up a hospital stay from 02-14-16 to 02-20-16 for cholelithiasis. He presented with RUQ pains and nausea. He had an ERCP with sphincterotomy, followed by a laparoscopic cholecystectomy. He did quite well and has fel better since going home. He is still weak but his appetite has returned and he is eating a regular diet. BMs are normal.    Review of Systems  Constitutional: Negative.   Respiratory: Negative.   Cardiovascular: Negative.   Gastrointestinal: Negative.   Neurological: Negative.        Objective:   Physical Exam  Constitutional: He is oriented to person, place, and time. He appears well-developed and well-nourished. No distress.  Cardiovascular: Normal rate, regular rhythm, normal heart sounds and intact distal pulses.   Pulmonary/Chest: Effort normal and breath sounds normal.  Abdominal: Soft. Bowel sounds are normal. He exhibits no distension. There is no tenderness. There is no rebound and no guarding.  There is a small non-tender easily reducible right direct inguinal hernia   Neurological: He is alert and oriented to person, place, and time.          Assessment & Plan:  He is doing well after a lap cholecystectomy. His BMs arenormal and his diet has returned to baseline. He will follow up with Dr. Kieth Brightly of General Surgery soon.  [name], MD

## 2016-03-07 LAB — CBC WITH DIFFERENTIAL/PLATELET
BASOS ABS: 0.1 10*3/uL (ref 0.0–0.1)
BASOS PCT: 1.2 % (ref 0.0–3.0)
Eosinophils Absolute: 0.1 10*3/uL (ref 0.0–0.7)
Eosinophils Relative: 1.5 % (ref 0.0–5.0)
HEMATOCRIT: 40.9 % (ref 39.0–52.0)
Hemoglobin: 13.8 g/dL (ref 13.0–17.0)
LYMPHS ABS: 2.5 10*3/uL (ref 0.7–4.0)
LYMPHS PCT: 32.6 % (ref 12.0–46.0)
MCHC: 33.7 g/dL (ref 30.0–36.0)
MCV: 94.6 fl (ref 78.0–100.0)
MONOS PCT: 6.8 % (ref 3.0–12.0)
Monocytes Absolute: 0.5 10*3/uL (ref 0.1–1.0)
NEUTROS ABS: 4.5 10*3/uL (ref 1.4–7.7)
NEUTROS PCT: 57.9 % (ref 43.0–77.0)
PLATELETS: 201 10*3/uL (ref 150.0–400.0)
RBC: 4.33 Mil/uL (ref 4.22–5.81)
RDW: 13.6 % (ref 11.5–15.5)
WBC: 7.8 10*3/uL (ref 4.0–10.5)

## 2016-03-19 ENCOUNTER — Encounter: Payer: Self-pay | Admitting: Neurology

## 2016-03-19 ENCOUNTER — Ambulatory Visit (INDEPENDENT_AMBULATORY_CARE_PROVIDER_SITE_OTHER): Payer: Medicare Other | Admitting: Neurology

## 2016-03-19 VITALS — BP 118/79 | HR 61 | Ht 66.0 in | Wt 141.5 lb

## 2016-03-19 DIAGNOSIS — R29898 Other symptoms and signs involving the musculoskeletal system: Secondary | ICD-10-CM | POA: Diagnosis not present

## 2016-03-19 NOTE — Progress Notes (Signed)
Reason for visit: Left arm weakness  Referring physician: Dr. [name] is a 82 y.o. male  History of present illness:  [name] is a 82 year old right-handed white male with a history of recent gallbladder surgery that was done in a staged procedure on 02/16/16 and then again on 02/18/2016. Following the first procedure, the patient noted onset of clumsiness of the left hand. He had difficulty with performing tasks with the arm, he denied any numbness or any definite weakness of the extremity. The patient reported no headache, change in mentation or memory, visual field changes, neck pain or left arm pain. The patient denied any changes in his ability to ambulate. The patient was sent for MRI evaluation of the brain which did not show any acute stroke. The patient indicates that the deficit lasted several hours, then fully cleared. He has not had any recurrence of the event. He has not been treated with any low-dose aspirin. He is sent to this office for an evaluation.  Past Medical History:  Diagnosis Date  . Allergy   . Anxiety   . Chickenpox   . Depression   . GERD (gastroesophageal reflux disease)   . Hearing loss    wears hearing aids  . Nephrolithiasis   . Normal cardiac stress test 09-20-12  . Osteoarthritis     Past Surgical History:  Procedure Laterality Date  . CATARACT EXTRACTION  2017  . CHOLECYSTECTOMY N/A 02/18/2016   Procedure: LAPAROSCOPIC CHOLECYSTECTOMY WITH POSSIBLE INTRAOPERATIVE CHOLANGIOGRAM;  Surgeon: Mickeal Skinner, MD;  Location: [place];  Service: General;  Laterality: N/A;  . ERCP N/A 02/16/2016   Procedure: ENDOSCOPIC RETROGRADE CHOLANGIOPANCREATOGRAPHY (ERCP);  Surgeon: Teena Irani, MD;  Location: [facility] ENDOSCOPY;  Service: Endoscopy;  Laterality: N/A;  . HERNIA REPAIR     wears truss  . MASTOIDECTOMY      Family History  Problem Relation Age of Onset  . Heart disease Mother     Pacemaker  . Colon cancer Father     Social  history:  reports that he has quit smoking. He has never used smokeless tobacco. He reports that he drinks about 10.8 oz of alcohol per week . He reports that he does not use drugs.  Medications:  Prior to Admission medications   Medication Sig Start Date End Date Taking? Authorizing Provider  citalopram (CELEXA) 10 MG tablet Take 1 tablet (10 mg total) by mouth daily. Patient taking differently: Take 20 mg by mouth daily.  07/25/15  Yes [name], MD  gabapentin (NEURONTIN) 100 MG capsule TAKE 1 CAPSULE AT BEDTIME. 02/27/16  Yes [name], MD  levothyroxine (SYNTHROID, LEVOTHROID) 50 MCG tablet Take 1 tablet (50 mcg total) by mouth daily. 07/28/15  Yes [name], MD  LORazepam (ATIVAN) 1 MG tablet TAKE 1 TABLET AT BEDTIME AS NEEDED. Patient taking differently: TAKE 1 TABLET AT BEDTIME 11/25/15  Yes [name], MD  magnesium oxide (MAG-OX) 400 MG tablet Take 400 mg by mouth daily.   Yes Historical Provider, MD  omeprazole (PRILOSEC) 40 MG capsule TAKE 1 CAPSULE DAILY. 01/16/16  Yes [name], MD  polyethylene glycol [facility]) packet Take 17 g by mouth daily as needed for moderate constipation.   Yes Historical Provider, MD  psyllium (HYDROCIL/METAMUCIL) 95 % PACK Take 1 packet by mouth daily as needed for moderate constipation.   Yes Historical Provider, MD  senna-docusate (SENOKOT-S) 8.6-50 MG tablet Take 1 tablet by mouth 2 (two)  times daily as needed for mild constipation. 02/19/16  Yes Ripudeep Krystal Eaton, MD  simethicone (MYLICON) 80 MG chewable tablet Chew 1 tablet (80 mg total) by mouth every 6 (six) hours as needed for flatulence. 02/19/16  Yes Ripudeep Krystal Eaton, MD     No Known Allergies  ROS:  Out of a complete 14 system review of symptoms, the patient complains only of the following symptoms, and all other reviewed systems are negative.  Weight loss Hearing loss Itching Constipation Urination problems Allergies, runny nose Decreased  energy Insomnia  Blood pressure 118/79, pulse 61, height 5\' 6"  (1.676 m), weight 141 lb 8 oz (64.2 kg).  Physical Exam  General: The patient is alert and cooperative at the time of the examination.  Eyes: Pupils are equal, round, and reactive to light. Discs are flat bilaterally.  Neck: The neck is supple, no carotid bruits are noted.  Respiratory: The respiratory examination is clear.  Cardiovascular: The cardiovascular examination reveals a regular rate and rhythm, no obvious murmurs or rubs are noted.  Skin: Extremities are without significant edema.  Neurologic Exam  Mental status: The patient is alert and oriented x 3 at the time of the examination. The patient has apparent normal recent and remote memory, with an apparently normal attention span and concentration ability.  Cranial nerves: Facial symmetry is present. There is good sensation of the face to pinprick and soft touch bilaterally. The strength of the facial muscles and the muscles to head turning and shoulder shrug are normal bilaterally. Speech is well enunciated, no aphasia or dysarthria is noted. Extraocular movements are full. Visual fields are full. The tongue is midline, and the patient has symmetric elevation of the soft palate. No obvious hearing deficits are noted.  Motor: The motor testing reveals 5 over 5 strength of all 4 extremities, with exception of some mild weakness of intrinsic muscles of the hands bilaterally. Good symmetric motor tone is noted throughout.  Sensory: Sensory testing is intact to pinprick, soft touch, vibration sensation, and position sense on all 4 extremities. No evidence of extinction is noted.  Coordination: Cerebellar testing reveals good finger-nose-finger and heel-to-shin bilaterally.  Gait and station: Gait is normal. Tandem gait is unsteady. Romberg is negative. No drift is seen.  Reflexes: Deep tendon reflexes are symmetric, but are depressed bilaterally. Toes are downgoing  bilaterally.   Assessment/Plan:  1. Transient left arm clumsiness  The patient may have had a TIA event. At age 43, an extensive stroke workup is not indicated. We will have the patient placed on low-dose aspirin, 81 mg daily. He will follow-up through this office if needed.   Jill Alexanders MD 03/19/2016 12:03 PM  [facility] [address] [place], [place] [address]  Phone [phone] Fax [phone]

## 2016-03-19 NOTE — Patient Instructions (Signed)
    Begin aspirin 81 mg once a day.

## 2016-03-26 ENCOUNTER — Other Ambulatory Visit: Payer: Self-pay | Admitting: Family Medicine

## 2016-03-27 NOTE — Telephone Encounter (Signed)
Pt started on this medication 05/27/14 for foot tingling (see note).  Do not see where pt has had yearly or where this has been addressed since.  Please advise. Thanks!!

## 2016-04-19 ENCOUNTER — Ambulatory Visit (INDEPENDENT_AMBULATORY_CARE_PROVIDER_SITE_OTHER): Payer: Medicare Other | Admitting: Family Medicine

## 2016-04-19 ENCOUNTER — Encounter: Payer: Self-pay | Admitting: Family Medicine

## 2016-04-19 VITALS — BP 118/70 | HR 71 | Ht 66.0 in | Wt 142.5 lb

## 2016-04-19 DIAGNOSIS — L299 Pruritus, unspecified: Secondary | ICD-10-CM

## 2016-04-19 DIAGNOSIS — K4090 Unilateral inguinal hernia, without obstruction or gangrene, not specified as recurrent: Secondary | ICD-10-CM

## 2016-04-19 MED ORDER — METHYLPREDNISOLONE ACETATE 80 MG/ML IJ SUSP
120.0000 mg | Freq: Once | INTRAMUSCULAR | Status: AC
  Administered 2016-04-19: 120 mg via INTRAMUSCULAR

## 2016-04-19 NOTE — Addendum Note (Signed)
Addended by: Aggie Hacker A on: 04/19/2016 11:01 AM   Modules accepted: Orders

## 2016-04-19 NOTE — Progress Notes (Signed)
   Subjective:    Patient ID: Allen Wheeler, male    DOB: 1920-03-16, 83 y.o.   MRN: [id number]  HPI Here for increasing pain from a right inguinal hernia. We have been following this for some months and it had no really bothered him until the past 2 months. It has not gotten any larger but he has developed a daily pattern of lower abdominal pain after every meal. He then has to sit down, put pressure on the hernia,  and reduce it up into the abdominal cavity to relieve the pain. BMs are regular. He also asks for a steroid shot to relieve his itchy skin which flares up every winter.    Review of Systems  Constitutional: Negative.   Respiratory: Negative.   Cardiovascular: Negative.   Gastrointestinal: Positive for abdominal pain. Negative for abdominal distention, anal bleeding, blood in stool, constipation, diarrhea, nausea, rectal pain and vomiting.  Genitourinary: Negative.        Objective:   Physical Exam  Constitutional: He appears well-developed and well-nourished.  Cardiovascular: Normal rate, regular rhythm, normal heart sounds and intact distal pulses.   Pulmonary/Chest: Effort normal and breath sounds normal.  Abdominal: Soft. Bowel sounds are normal. He exhibits no distension. There is no tenderness. There is no rebound and no guarding.  There is a small reducible non-tender right direct inguinal hernia           Assessment & Plan:  His hernia now needs to be repaired so we will refer him to Surgery. Given a steroid shot for the eczema.  Alysia Penna, MD

## 2016-04-19 NOTE — Progress Notes (Signed)
Pre visit review using our clinic review tool, if applicable. No additional management support is needed unless otherwise documented below in the visit note. 

## 2016-05-04 ENCOUNTER — Encounter: Payer: Self-pay | Admitting: Family Medicine

## 2016-05-04 ENCOUNTER — Ambulatory Visit (INDEPENDENT_AMBULATORY_CARE_PROVIDER_SITE_OTHER): Payer: Medicare Other | Admitting: Family Medicine

## 2016-05-04 VITALS — BP 134/88 | HR 64 | Temp 97.9°F | Ht 66.0 in | Wt 141.0 lb

## 2016-05-04 DIAGNOSIS — G459 Transient cerebral ischemic attack, unspecified: Secondary | ICD-10-CM

## 2016-05-04 DIAGNOSIS — K4090 Unilateral inguinal hernia, without obstruction or gangrene, not specified as recurrent: Secondary | ICD-10-CM | POA: Diagnosis not present

## 2016-05-04 NOTE — Progress Notes (Signed)
   Subjective:    Patient ID: Allen Wheeler, male    DOB: 1919/08/31, 83 y.o.   MRN: [id number]  HPI Here for several issues. First we evaluated his right inguinal hernia a few weeks ago and referred him for a surgical consultation. He will see Dr. Kieth Brightly on 05-09-16 for this. He says he has had to reduce the hernia by pushing up with his hand more often lately, but this is easy to do and he has had no increase in pain. He asks if it would be safe to wait for this appt next week. Also he asks about an incident that occurred yesterday while he was eating lunch with a friend. In the middle of conversation he noticed his speech became slurred and he had trouble saying the words he wanted to say. This lasted about 2 minutes and it went away. He has been speaking fine ever since. He had no other symptoms yesterday, no headaches, etc. Of note he was worked up last November by Neurology when he had an episode of transient weakness in the left hand. He had a brain MRI on 02-17-16 which was clear. He has had no further weakness since then. He does take an 81 mg aspirin daily.    Review of Systems  Constitutional: Negative.   Respiratory: Negative.   Cardiovascular: Negative.   Gastrointestinal: Positive for abdominal pain. Negative for abdominal distention, anal bleeding, blood in stool, constipation, diarrhea, nausea, rectal pain and vomiting.  Neurological: Positive for speech difficulty and light-headedness. Negative for dizziness, tremors, seizures, syncope, facial asymmetry, weakness, numbness and headaches.       Objective:   Physical Exam  Constitutional: He is oriented to person, place, and time. He appears well-developed and well-nourished. No distress.  Cardiovascular: Normal rate, regular rhythm, normal heart sounds and intact distal pulses.   Pulmonary/Chest: Effort normal and breath sounds normal.  Abdominal: Soft. Bowel sounds are normal. He exhibits no distension. There is no rebound  and no guarding.  He has a right inguinal hernia that is not tender and is easily reducible   Neurological: He is alert and oriented to person, place, and time. No cranial nerve deficit. He exhibits normal muscle tone. Coordination normal.          Assessment & Plan:  The hernia seems to be stable and I reassured him that he should be fine to wait for his scheduled Surgery appt next week. As for the spell of slurred speech, it sounds like he has had several TIAs in the past few months. He had a recent brain MRI that was unremarkable. We will set up some carotid dopplers soon to evaluate this further. He will stay on the aspirin.  Alysia Penna, MD

## 2016-05-04 NOTE — Progress Notes (Signed)
Pre visit review using our clinic review tool, if applicable. No additional management support is needed unless otherwise documented below in the visit note. 

## 2016-05-09 DIAGNOSIS — K4090 Unilateral inguinal hernia, without obstruction or gangrene, not specified as recurrent: Secondary | ICD-10-CM | POA: Diagnosis not present

## 2016-05-10 ENCOUNTER — Ambulatory Visit: Payer: Self-pay | Admitting: General Surgery

## 2016-05-14 ENCOUNTER — Telehealth: Payer: Self-pay | Admitting: Family Medicine

## 2016-05-14 NOTE — Telephone Encounter (Signed)
Pt was seen on 05-04-16 and calling to check on status of carotid ultrasound

## 2016-05-16 ENCOUNTER — Telehealth: Payer: Self-pay | Admitting: Family Medicine

## 2016-05-16 NOTE — Telephone Encounter (Signed)
° °  Pt call to say he received a call about getting the pneumonia shot and is asking if he need to have this shot. He also ssaid he has not been contacted concerning his Vascular procedure. Would like a call back

## 2016-05-17 NOTE — Telephone Encounter (Signed)
I spoke with pt, he does need the referral for vascular, said he discussed during his last office visit. Advised pt to schedule the pneumonia 13 vaccine.

## 2016-05-17 NOTE — Telephone Encounter (Signed)
Actually I had ordered carotid dopplers on the day he was here but I am not sure where this stands. Please check on this order

## 2016-05-22 ENCOUNTER — Telehealth: Payer: Self-pay | Admitting: Family Medicine

## 2016-05-22 NOTE — Telephone Encounter (Signed)
Per Dr. Sarajane Jews, he would recommend patient have the carotid vascular ultra sound done first before the surgery. I spoke with pt and gave this information, he agreed with advice.

## 2016-05-23 ENCOUNTER — Other Ambulatory Visit: Payer: Self-pay | Admitting: Family Medicine

## 2016-05-25 NOTE — Telephone Encounter (Signed)
Call in #30 with 5 rf 

## 2016-05-28 ENCOUNTER — Ambulatory Visit (HOSPITAL_COMMUNITY): Admission: RE | Admit: 2016-05-28 | Payer: Medicare Other | Source: Ambulatory Visit | Admitting: General Surgery

## 2016-05-28 ENCOUNTER — Encounter (HOSPITAL_COMMUNITY): Admission: RE | Payer: Self-pay | Source: Ambulatory Visit

## 2016-05-28 SURGERY — REPAIR, HERNIA, INGUINAL, ADULT
Anesthesia: General | Laterality: Right

## 2016-05-29 ENCOUNTER — Ambulatory Visit (INDEPENDENT_AMBULATORY_CARE_PROVIDER_SITE_OTHER): Payer: Medicare Other

## 2016-05-29 DIAGNOSIS — Z23 Encounter for immunization: Secondary | ICD-10-CM

## 2016-06-04 ENCOUNTER — Ambulatory Visit (HOSPITAL_COMMUNITY)
Admission: RE | Admit: 2016-06-04 | Discharge: 2016-06-04 | Disposition: A | Discharge status: Home / Self Care | Payer: Medicare Other | Source: Ambulatory Visit | Attending: Cardiology | Admitting: Cardiology

## 2016-06-04 DIAGNOSIS — Z8673 Personal history of transient ischemic attack (TIA), and cerebral infarction without residual deficits: Secondary | ICD-10-CM | POA: Diagnosis not present

## 2016-06-04 DIAGNOSIS — Z7982 Long term (current) use of aspirin: Secondary | ICD-10-CM | POA: Insufficient documentation

## 2016-06-04 DIAGNOSIS — G459 Transient cerebral ischemic attack, unspecified: Secondary | ICD-10-CM

## 2016-06-04 DIAGNOSIS — R42 Dizziness and giddiness: Secondary | ICD-10-CM | POA: Insufficient documentation

## 2016-06-04 DIAGNOSIS — Z87891 Personal history of nicotine dependence: Secondary | ICD-10-CM | POA: Diagnosis not present

## 2016-06-04 DIAGNOSIS — I6523 Occlusion and stenosis of bilateral carotid arteries: Secondary | ICD-10-CM | POA: Diagnosis not present

## 2016-06-11 DIAGNOSIS — Z961 Presence of intraocular lens: Secondary | ICD-10-CM | POA: Diagnosis not present

## 2016-06-14 NOTE — Progress Notes (Signed)
LOV Dr Alysia Penna 05-04-16 epic Carotid vascular doppler 06-04-16 in epic CXR 02-14-16 epic EKG 02-17-16 epic

## 2016-06-14 NOTE — Patient Instructions (Addendum)
Allen Wheeler  06/14/2016   Your procedure is scheduled on: 06-21-16  Report to [facility]  Entrance take [facility]  elevators to 3rd floor to  [place] at 930AM.  Call this number if you have problems the morning of surgery [phone]   Remember: ONLY 1 PERSON MAY GO WITH YOU TO SHORT STAY TO GET  READY MORNING OF [place].  Do not eat food or drink liquids :After Midnight.      Take these medicines the morning of surgery with A SIP OF WATER: levothyroxine(synthroid), omeprazole(prilosec)                                 You may not have any metal on your body including hair pins and              piercings  Do not wear jewelry, make-up, lotions, powders or perfumes, deodorant             Do not wear nail polish.  Do not shave  48 hours prior to surgery.              Men may shave face and neck.   Do not bring valuables to the hospital. [place].  Contacts, dentures or bridgework may not be worn into surgery.  Leave suitcase in the car. After surgery it may be brought to your room.               Please read over the following fact sheets you were given: _____________________________________________________________________             [facility] - Preparing for Surgery Before surgery, you can play an important role.  Because skin is not sterile, your skin needs to be as free of germs as possible.  You can reduce the number of germs on your skin by washing with CHG (chlorahexidine gluconate) soap before surgery.  CHG is an antiseptic cleaner which kills germs and bonds with the skin to continue killing germs even after washing. Please DO NOT use if you have an allergy to CHG or antibacterial soaps.  If your skin becomes reddened/irritated stop using the CHG and inform your nurse when you arrive at Short Stay. Do not shave (including legs and underarms) for at least 48 hours prior to the first CHG  shower.  You may shave your face/neck. Please follow these instructions carefully:  1.  Shower with CHG Soap the night before surgery and the  morning of Surgery.  2.  If you choose to wash your hair, wash your hair first as usual with your  normal  shampoo.  3.  After you shampoo, rinse your hair and body thoroughly to remove the  shampoo.                           4.  Use CHG as you would any other liquid soap.  You can apply chg directly  to the skin and wash                       Gently with a scrungie or clean washcloth.  5.  Apply the CHG Soap to your  body ONLY FROM THE NECK DOWN.   Do not use on face/ open                           Wound or open sores. Avoid contact with eyes, ears mouth and genitals (private parts).                       Wash face,  Genitals (private parts) with your normal soap.             6.  Wash thoroughly, paying special attention to the area where your surgery  will be performed.  7.  Thoroughly rinse your body with warm water from the neck down.  8.  DO NOT shower/wash with your normal soap after using and rinsing off  the CHG Soap.                9.  Pat yourself dry with a clean towel.            10.  Wear clean pajamas.            11.  Place clean sheets on your bed the night of your first shower and do not  sleep with pets. Day of Surgery : Do not apply any lotions/deodorants the morning of surgery.  Please wear clean clothes to the hospital/surgery center.  FAILURE TO FOLLOW THESE INSTRUCTIONS MAY RESULT IN THE CANCELLATION OF YOUR SURGERY PATIENT SIGNATURE_________________________________  NURSE SIGNATURE__________________________________  ________________________________________________________________________

## 2016-06-15 ENCOUNTER — Encounter (HOSPITAL_COMMUNITY): Payer: Self-pay

## 2016-06-15 ENCOUNTER — Encounter (HOSPITAL_COMMUNITY)
Admission: RE | Admit: 2016-06-15 | Discharge: 2016-06-15 | Disposition: A | Discharge status: Home / Self Care | Payer: Medicare Other | Source: Ambulatory Visit | Attending: General Surgery | Admitting: General Surgery

## 2016-06-15 DIAGNOSIS — K4090 Unilateral inguinal hernia, without obstruction or gangrene, not specified as recurrent: Secondary | ICD-10-CM | POA: Diagnosis not present

## 2016-06-15 DIAGNOSIS — Z01812 Encounter for preprocedural laboratory examination: Secondary | ICD-10-CM | POA: Diagnosis not present

## 2016-06-15 LAB — CBC WITH DIFFERENTIAL/PLATELET
Basophils Absolute: 0 10*3/uL (ref 0.0–0.1)
Basophils Relative: 0 %
EOS PCT: 1 %
Eosinophils Absolute: 0.1 10*3/uL (ref 0.0–0.7)
HCT: 39.3 % (ref 39.0–52.0)
Hemoglobin: 13.1 g/dL (ref 13.0–17.0)
LYMPHS ABS: 2.1 10*3/uL (ref 0.7–4.0)
LYMPHS PCT: 32 %
MCH: 31.9 pg (ref 26.0–34.0)
MCHC: 33.3 g/dL (ref 30.0–36.0)
MCV: 95.6 fL (ref 78.0–100.0)
MONO ABS: 0.7 10*3/uL (ref 0.1–1.0)
MONOS PCT: 10 %
Neutro Abs: 3.8 10*3/uL (ref 1.7–7.7)
Neutrophils Relative %: 57 %
PLATELETS: 201 10*3/uL (ref 150–400)
RBC: 4.11 MIL/uL — ABNORMAL LOW (ref 4.22–5.81)
RDW: 13.7 % (ref 11.5–15.5)
WBC: 6.7 10*3/uL (ref 4.0–10.5)

## 2016-06-15 LAB — BASIC METABOLIC PANEL
Anion gap: 7 (ref 5–15)
BUN: 17 mg/dL (ref 6–20)
CHLORIDE: 106 mmol/L (ref 101–111)
CO2: 25 mmol/L (ref 22–32)
Calcium: 9.2 mg/dL (ref 8.9–10.3)
Creatinine, Ser: 1.22 mg/dL (ref 0.61–1.24)
GFR calc Af Amer: 57 mL/min — ABNORMAL LOW (ref >60)
GFR calc non Af Amer: 49 mL/min — ABNORMAL LOW (ref >60)
GLUCOSE: 53 mg/dL — AB (ref 65–99)
POTASSIUM: 5.2 mmol/L — AB (ref 3.5–5.1)
Sodium: 138 mmol/L (ref 135–145)

## 2016-06-15 NOTE — Progress Notes (Signed)
Consulted with Dr Kalman Shan concerning pt recent multiple TIAs. carotid doppler was shown to [facility] and no recommendations  Were made

## 2016-06-21 ENCOUNTER — Ambulatory Visit (HOSPITAL_COMMUNITY): Payer: Medicare Other | Admitting: Anesthesiology

## 2016-06-21 ENCOUNTER — Observation Stay (HOSPITAL_COMMUNITY)
Admission: RE | Admit: 2016-06-21 | Discharge: 2016-06-22 | Disposition: A | Discharge status: Home / Self Care | Payer: Medicare Other | Source: Ambulatory Visit | Attending: General Surgery | Admitting: General Surgery

## 2016-06-21 ENCOUNTER — Encounter (HOSPITAL_COMMUNITY): Payer: Self-pay | Admitting: *Deleted

## 2016-06-21 ENCOUNTER — Encounter (HOSPITAL_COMMUNITY)
Admission: RE | Disposition: A | Discharge status: Home / Self Care | Payer: Self-pay | Source: Ambulatory Visit | Attending: General Surgery

## 2016-06-21 DIAGNOSIS — K4190 Unilateral femoral hernia, without obstruction or gangrene, not specified as recurrent: Secondary | ICD-10-CM | POA: Insufficient documentation

## 2016-06-21 DIAGNOSIS — K4090 Unilateral inguinal hernia, without obstruction or gangrene, not specified as recurrent: Principal | ICD-10-CM

## 2016-06-21 DIAGNOSIS — F419 Anxiety disorder, unspecified: Secondary | ICD-10-CM | POA: Insufficient documentation

## 2016-06-21 DIAGNOSIS — Z9049 Acquired absence of other specified parts of digestive tract: Secondary | ICD-10-CM | POA: Diagnosis not present

## 2016-06-21 DIAGNOSIS — Z7982 Long term (current) use of aspirin: Secondary | ICD-10-CM | POA: Diagnosis not present

## 2016-06-21 DIAGNOSIS — K219 Gastro-esophageal reflux disease without esophagitis: Secondary | ICD-10-CM | POA: Diagnosis not present

## 2016-06-21 DIAGNOSIS — Z87891 Personal history of nicotine dependence: Secondary | ICD-10-CM | POA: Diagnosis not present

## 2016-06-21 DIAGNOSIS — F329 Major depressive disorder, single episode, unspecified: Secondary | ICD-10-CM | POA: Diagnosis not present

## 2016-06-21 DIAGNOSIS — E039 Hypothyroidism, unspecified: Secondary | ICD-10-CM | POA: Diagnosis not present

## 2016-06-21 DIAGNOSIS — Z79899 Other long term (current) drug therapy: Secondary | ICD-10-CM | POA: Diagnosis not present

## 2016-06-21 HISTORY — PX: INGUINAL HERNIA REPAIR: SHX194

## 2016-06-21 HISTORY — PX: INSERTION OF MESH: SHX5868

## 2016-06-21 HISTORY — DX: Dizziness and giddiness: R42

## 2016-06-21 LAB — CBC
HEMATOCRIT: 40 % (ref 39.0–52.0)
HEMOGLOBIN: 13.6 g/dL (ref 13.0–17.0)
MCH: 32.1 pg (ref 26.0–34.0)
MCHC: 34 g/dL (ref 30.0–36.0)
MCV: 94.3 fL (ref 78.0–100.0)
Platelets: 224 10*3/uL (ref 150–400)
RBC: 4.24 MIL/uL (ref 4.22–5.81)
RDW: 13.6 % (ref 11.5–15.5)
WBC: 8.2 10*3/uL (ref 4.0–10.5)

## 2016-06-21 LAB — CREATININE, SERUM
Creatinine, Ser: 1.3 mg/dL — ABNORMAL HIGH (ref 0.61–1.24)
GFR, EST AFRICAN AMERICAN: 52 mL/min — AB (ref >60)
GFR, EST NON AFRICAN AMERICAN: 45 mL/min — AB (ref >60)

## 2016-06-21 SURGERY — REPAIR, HERNIA, INGUINAL, ADULT
Anesthesia: General | Site: Inguinal | Laterality: Right

## 2016-06-21 MED ORDER — FENTANYL CITRATE (PF) 100 MCG/2ML IJ SOLN
INTRAMUSCULAR | Status: AC
  Filled 2016-06-21: qty 2

## 2016-06-21 MED ORDER — ACETAMINOPHEN 325 MG PO TABS
650.0000 mg | ORAL_TABLET | Freq: Four times a day (QID) | ORAL | Status: DC | PRN
  Administered 2016-06-22: 650 mg via ORAL
  Filled 2016-06-21: qty 2

## 2016-06-21 MED ORDER — PANTOPRAZOLE SODIUM 40 MG PO TBEC
40.0000 mg | DELAYED_RELEASE_TABLET | Freq: Every day | ORAL | Status: DC
  Administered 2016-06-22: 40 mg via ORAL
  Filled 2016-06-21: qty 1

## 2016-06-21 MED ORDER — CITALOPRAM HYDROBROMIDE 20 MG PO TABS
10.0000 mg | ORAL_TABLET | Freq: Every day | ORAL | Status: DC
  Administered 2016-06-21: 10 mg via ORAL
  Filled 2016-06-21: qty 1

## 2016-06-21 MED ORDER — BUPIVACAINE HCL (PF) 0.5 % IJ SOLN
INTRAMUSCULAR | Status: AC
  Filled 2016-06-21: qty 30

## 2016-06-21 MED ORDER — PROPOFOL 10 MG/ML IV BOLUS
INTRAVENOUS | Status: AC
  Filled 2016-06-21: qty 20

## 2016-06-21 MED ORDER — FENTANYL CITRATE (PF) 100 MCG/2ML IJ SOLN
INTRAMUSCULAR | Status: AC
  Administered 2016-06-21: 25 ug via INTRAVENOUS
  Filled 2016-06-21: qty 2

## 2016-06-21 MED ORDER — FENTANYL CITRATE (PF) 100 MCG/2ML IJ SOLN
INTRAMUSCULAR | Status: AC
Start: 2016-06-21 — End: 2016-06-21
  Filled 2016-06-21: qty 2

## 2016-06-21 MED ORDER — 0.9 % SODIUM CHLORIDE (POUR BTL) OPTIME
TOPICAL | Status: DC | PRN
  Administered 2016-06-21: 1000 mL

## 2016-06-21 MED ORDER — ONDANSETRON 4 MG PO TBDP: 4.0000 mg | ORAL_TABLET | Freq: Four times a day (QID) | ORAL | Status: DC | PRN

## 2016-06-21 MED ORDER — DIPHENHYDRAMINE HCL 12.5 MG/5ML PO ELIX: 12.5000 mg | ORAL_SOLUTION | Freq: Four times a day (QID) | ORAL | Status: DC | PRN

## 2016-06-21 MED ORDER — EPHEDRINE SULFATE 50 MG/ML IJ SOLN
INTRAMUSCULAR | Status: DC | PRN
  Administered 2016-06-21 (×3): 10 mg via INTRAVENOUS

## 2016-06-21 MED ORDER — SENNOSIDES-DOCUSATE SODIUM 8.6-50 MG PO TABS: 1.0000 | ORAL_TABLET | Freq: Two times a day (BID) | ORAL | Status: DC | PRN

## 2016-06-21 MED ORDER — PHENYLEPHRINE 40 MCG/ML (10ML) SYRINGE FOR IV PUSH (FOR BLOOD PRESSURE SUPPORT)
PREFILLED_SYRINGE | INTRAVENOUS | Status: AC
  Filled 2016-06-21: qty 10

## 2016-06-21 MED ORDER — HEPARIN SODIUM (PORCINE) 5000 UNIT/ML IJ SOLN
5000.0000 [IU] | Freq: Three times a day (TID) | INTRAMUSCULAR | Status: DC
  Administered 2016-06-21 – 2016-06-22 (×2): 5000 [IU] via SUBCUTANEOUS
  Filled 2016-06-21 (×2): qty 1

## 2016-06-21 MED ORDER — BUPIVACAINE HCL 0.5 % IJ SOLN
INTRAMUSCULAR | Status: DC | PRN
  Administered 2016-06-21: 30 mL

## 2016-06-21 MED ORDER — HYDROCODONE-ACETAMINOPHEN 5-325 MG PO TABS
1.0000 | ORAL_TABLET | ORAL | Status: DC | PRN
  Filled 2016-06-21: qty 1

## 2016-06-21 MED ORDER — PHENYLEPHRINE HCL 10 MG/ML IJ SOLN
INTRAMUSCULAR | Status: DC | PRN
  Administered 2016-06-21 (×4): 80 ug via INTRAVENOUS

## 2016-06-21 MED ORDER — ONDANSETRON HCL 4 MG/2ML IJ SOLN
INTRAMUSCULAR | Status: AC
  Filled 2016-06-21: qty 2

## 2016-06-21 MED ORDER — DIPHENHYDRAMINE HCL 50 MG/ML IJ SOLN
12.5000 mg | Freq: Four times a day (QID) | INTRAMUSCULAR | Status: DC | PRN
Start: 2016-06-21 — End: 2016-06-22

## 2016-06-21 MED ORDER — FENTANYL CITRATE (PF) 100 MCG/2ML IJ SOLN
25.0000 ug | INTRAMUSCULAR | Status: DC | PRN
  Administered 2016-06-21 (×2): 25 ug via INTRAVENOUS

## 2016-06-21 MED ORDER — ONDANSETRON HCL 4 MG/2ML IJ SOLN
INTRAMUSCULAR | Status: DC | PRN
  Administered 2016-06-21: 4 mg via INTRAVENOUS

## 2016-06-21 MED ORDER — ONDANSETRON HCL 4 MG/2ML IJ SOLN: 4.0000 mg | Freq: Four times a day (QID) | INTRAMUSCULAR | Status: DC | PRN

## 2016-06-21 MED ORDER — LEVOTHYROXINE SODIUM 50 MCG PO TABS
50.0000 ug | ORAL_TABLET | Freq: Every day | ORAL | Status: DC
  Administered 2016-06-22: 50 ug via ORAL
  Filled 2016-06-21: qty 1

## 2016-06-21 MED ORDER — SIMETHICONE 80 MG PO CHEW: 80.0000 mg | CHEWABLE_TABLET | Freq: Four times a day (QID) | ORAL | Status: DC | PRN

## 2016-06-21 MED ORDER — LACTATED RINGERS IV SOLN
INTRAVENOUS | Status: DC
  Administered 2016-06-21: 1000 mL via INTRAVENOUS

## 2016-06-21 MED ORDER — LIDOCAINE 2% (20 MG/ML) 5 ML SYRINGE
INTRAMUSCULAR | Status: AC
  Filled 2016-06-21: qty 5

## 2016-06-21 MED ORDER — CEFAZOLIN SODIUM-DEXTROSE 2-4 GM/100ML-% IV SOLN
2.0000 g | INTRAVENOUS | Status: AC
  Administered 2016-06-21: 2 g via INTRAVENOUS
  Filled 2016-06-21: qty 100

## 2016-06-21 MED ORDER — GABAPENTIN 100 MG PO CAPS
100.0000 mg | ORAL_CAPSULE | Freq: Every day | ORAL | Status: DC
  Administered 2016-06-21: 100 mg via ORAL
  Filled 2016-06-21: qty 1

## 2016-06-21 MED ORDER — ACETAMINOPHEN 650 MG RE SUPP: 650.0000 mg | Freq: Four times a day (QID) | RECTAL | Status: DC | PRN

## 2016-06-21 MED ORDER — LIDOCAINE 2% (20 MG/ML) 5 ML SYRINGE
INTRAMUSCULAR | Status: DC | PRN
  Administered 2016-06-21: 60 mg via INTRAVENOUS

## 2016-06-21 MED ORDER — PROPOFOL 10 MG/ML IV BOLUS
INTRAVENOUS | Status: DC | PRN
  Administered 2016-06-21: 200 mg via INTRAVENOUS

## 2016-06-21 MED ORDER — LORAZEPAM 1 MG PO TABS
1.0000 mg | ORAL_TABLET | Freq: Every day | ORAL | Status: DC
  Administered 2016-06-21: 1 mg via ORAL
  Filled 2016-06-21: qty 1

## 2016-06-21 MED ORDER — MORPHINE SULFATE (PF) 4 MG/ML IV SOLN: 2.0000 mg | INTRAVENOUS | Status: DC | PRN

## 2016-06-21 MED ORDER — EPHEDRINE 5 MG/ML INJ
INTRAVENOUS | Status: AC
  Filled 2016-06-21: qty 10

## 2016-06-21 MED ORDER — LACTATED RINGERS IV SOLN
INTRAVENOUS | Status: DC
  Administered 2016-06-21: 11:00:00 via INTRAVENOUS
  Administered 2016-06-22: 75 mL/h via INTRAVENOUS

## 2016-06-21 MED ORDER — FENTANYL CITRATE (PF) 100 MCG/2ML IJ SOLN
INTRAMUSCULAR | Status: DC | PRN
  Administered 2016-06-21 (×4): 25 ug via INTRAVENOUS

## 2016-06-21 SURGICAL SUPPLY — 39 items
APL SKNCLS STERI-STRIP NONHPOA (GAUZE/BANDAGES/DRESSINGS) ×1
BENZOIN TINCTURE PRP APPL 2/3 (GAUZE/BANDAGES/DRESSINGS) ×3 IMPLANT
BLADE SURG 15 STRL LF DISP TIS (BLADE) ×1 IMPLANT
BLADE SURG 15 STRL SS (BLADE) ×3
CHLORAPREP W/TINT 26ML (MISCELLANEOUS) ×3 IMPLANT
CLOSURE WOUND 1/2 X4 (GAUZE/BANDAGES/DRESSINGS) ×1
COVER SURGICAL LIGHT HANDLE (MISCELLANEOUS) ×3 IMPLANT
DRAIN PENROSE 18X1/2 LTX STRL (DRAIN) ×2 IMPLANT
DRAPE LAPAROTOMY TRNSV 102X78 (DRAPE) ×3 IMPLANT
DRSG TEGADERM 4X4.75 (GAUZE/BANDAGES/DRESSINGS) ×2 IMPLANT
ELECT PENCIL ROCKER SW 15FT (MISCELLANEOUS) ×3 IMPLANT
ELECT REM PT RETURN 9FT ADLT (ELECTROSURGICAL) ×3
ELECTRODE REM PT RTRN 9FT ADLT (ELECTROSURGICAL) ×1 IMPLANT
GAUZE SPONGE 4X4 12PLY STRL (GAUZE/BANDAGES/DRESSINGS) ×2 IMPLANT
GLOVE BIO SURGEON STRL SZ7 (GLOVE) ×5 IMPLANT
GLOVE BIOGEL PI IND STRL 6.5 (GLOVE) IMPLANT
GLOVE BIOGEL PI IND STRL 7.0 (GLOVE) IMPLANT
GLOVE BIOGEL PI INDICATOR 6.5 (GLOVE) ×2
GLOVE BIOGEL PI INDICATOR 7.0 (GLOVE) ×2
GOWN STRL REUS W/TWL LRG LVL3 (GOWN DISPOSABLE) ×3 IMPLANT
GOWN STRL REUS W/TWL XL LVL3 (GOWN DISPOSABLE) ×3 IMPLANT
KIT BASIN OR (CUSTOM PROCEDURE TRAY) ×3 IMPLANT
MESH BARD SOFT 3X6IN (Mesh General) ×2 IMPLANT
NEEDLE HYPO 22GX1.5 SAFETY (NEEDLE) ×3 IMPLANT
PACK BASIC VI WITH GOWN DISP (CUSTOM PROCEDURE TRAY) ×3 IMPLANT
SPONGE LAP 18X18 X RAY DECT (DISPOSABLE) IMPLANT
SPONGE LAP 4X18 X RAY DECT (DISPOSABLE) ×3 IMPLANT
STRIP CLOSURE SKIN 1/2X4 (GAUZE/BANDAGES/DRESSINGS) ×1 IMPLANT
SUT MNCRL AB 4-0 PS2 18 (SUTURE) ×3 IMPLANT
SUT PROLENE 2 0 CT2 30 (SUTURE) ×8 IMPLANT
SUT VIC AB 2-0 CT1 27 (SUTURE) ×6
SUT VIC AB 2-0 CT1 TAPERPNT 27 (SUTURE) ×1 IMPLANT
SUT VIC AB 3-0 SH 27 (SUTURE) ×3
SUT VIC AB 3-0 SH 27XBRD (SUTURE) ×1 IMPLANT
SYR BULB IRRIGATION 50ML (SYRINGE) ×3 IMPLANT
SYR CONTROL 10ML LL (SYRINGE) ×3 IMPLANT
TOWEL OR 17X26 10 PK STRL BLUE (TOWEL DISPOSABLE) ×3 IMPLANT
TOWEL OR NON WOVEN STRL DISP B (DISPOSABLE) ×3 IMPLANT
YANKAUER SUCT BULB TIP 10FT TU (MISCELLANEOUS) ×3 IMPLANT

## 2016-06-21 NOTE — Anesthesia Procedure Notes (Signed)
Procedure Name: LMA Insertion Date/Time: 06/21/2016 12:25 PM Performed by: Maxwell Caul Pre-anesthesia Checklist: Patient identified, Emergency Drugs available, Suction available and Patient being monitored Patient Re-evaluated:Patient Re-evaluated prior to inductionOxygen Delivery Method: Circle system utilized Preoxygenation: Pre-oxygenation with 100% oxygen Intubation Type: IV induction LMA: LMA inserted LMA Size: 4.0 Number of attempts: 1 Placement Confirmation: positive ETCO2 and breath sounds checked- equal and bilateral Tube secured with: Tape Dental Injury: Teeth and Oropharynx as per pre-operative assessment

## 2016-06-21 NOTE — Op Note (Signed)
Preop diagnosis: right inguinal hernia  Postop diagnosis: right indirect inguinal hernia and right femoral hernia  Procedure: open Right inguinal hernia repair with mesh  Surgeon: Gurney Maxin, M.D.  Asst: none  Anesthesia: Gen.   Indications for procedure: Allen Wheeler is a 83 y.o. male with symptoms of pain and enlarging Right inguinal hernia(s). After discussing risks, alternatives and benefits he decided on open repair and was brought to day surgery for repair.  Description of procedure: The patient was brought into the operative suite, placed supine. Anesthesia was administered with endotracheal tube. Patient was strapped in place. The patient was prepped and draped in the usual sterile fashion.  The anterior superior iliac spine and pubic tubercle were identified on the Right side. An incision was made 1cm above the connecting line, representative of the location of the inguinal ligament. The subcutaneous tissue was bluntly dissected, scarpa's fascia was dissected away. The external abdominal oblique fascia was identified and sharply opened down to the external inguinal ring. The conjoint tendon and inguinal ligament were identified. The cord structures and sac were dissected free of the surrounding tissue in 360 degrees. A penrose drain was used to encircle the contents. The cremasteric fibers were dissected free of the contents of the cord and hernia sac. The cord structures (vessels and vas deferens) were identified and carefully dissected away from the hernia sac. There were 2 hernia sacs 1 femoral and one indirect which joined in the inguinal floor. The hernia sac was dissected down to the internal inguinal ring. Preperitoneal fat was identified showing appropriate dissection. The sac was opened, reduced and then ligated and sutured closed with 2-0 vicryl. The sac was then reduced into the preperitoneal space. Interrupted 2-0 prolene was used to suture the conjoint tendon to Cooper's  ligament on the medial aspect to close the femoral space. A 3x6 Bard Soft mesh was then used to close the defect and reinforce the floor. The mesh was sutured to the lacunar ligament and then Cooper's ligament and then inguinal ligament using a 2-0 prolene in running fashion. Next the superior edge of the mesh was sutured to the conjoined tendon using a 2-0 running Prolene. An additional 2-0 Prolene was used to suture the tail ends of the mesh together re-creating the deep ring. Cord structures are running in a neutral position through the mesh. Next the external abdominal oblique fascia was closed with a 2-0 Vicryl in running fashion to re-create the external inguinal ring. Scarpa's fascia was closed with 3-0 Vicryl in running fashion. Skin was closed with a 4-0 Monocryl subcuticular stitch in running fashion. Steristrips and dressing were put in place for dressing. Patient woke from anesthesia and brought to PACU in stable condition. All counts are correct.    Findings: right indirect inguinal hernia and femoral hernia  Specimen: none  Blood loss: 30 ml  Local anesthesia: 30 ml 0.5% Marcaine mix  Complications: none  Implant: 3x6 in bard soft mesh  Gurney Maxin, M.D. General, Bariatric, & Minimally Invasive Surgery [facility] 1:48 PM 06/21/2016

## 2016-06-21 NOTE — Anesthesia Preprocedure Evaluation (Addendum)
Anesthesia Evaluation  Patient identified by MRN, date of birth, ID band Patient awake    Reviewed: Allergy & Precautions, NPO status , Patient's Chart, lab work & pertinent test results  History of Anesthesia Complications Negative for: history of anesthetic complications  Airway Mallampati: II  TM Distance: >3 FB Neck ROM: Full    Dental no notable dental hx. (+) Dental Advisory Given   Pulmonary neg pulmonary ROS, former smoker,    Pulmonary exam normal        Cardiovascular negative cardio ROS Normal cardiovascular exam     Neuro/Psych PSYCHIATRIC DISORDERS Anxiety Depression negative neurological ROS     GI/Hepatic Neg liver ROS, GERD  ,  Endo/Other  Hypothyroidism   Renal/GU negative Renal ROS     Musculoskeletal negative musculoskeletal ROS (+)   Abdominal   Peds  Hematology negative hematology ROS (+)   Anesthesia Other Findings Day of surgery medications reviewed with the patient.  Reproductive/Obstetrics                            Anesthesia Physical Anesthesia Plan  ASA: II  Anesthesia Plan: General   Post-op Pain Management:    Induction: Intravenous  Airway Management Planned: LMA  Additional Equipment:   Intra-op Plan:   Post-operative Plan: Extubation in OR  Informed Consent: I have reviewed the patients History and Physical, chart, labs and discussed the procedure including the risks, benefits and alternatives for the proposed anesthesia with the patient or authorized representative who has indicated his/her understanding and acceptance.   Dental advisory given  Plan Discussed with: CRNA, Anesthesiologist and Surgeon  Anesthesia Plan Comments:        Anesthesia Quick Evaluation

## 2016-06-21 NOTE — H&P (Signed)
Allen Wheeler is an 83 y.o. male.   Chief Complaint: right inguinal hernia HPI: THe patient has a long term right inguinal hernia that in the last 3 months has developed worsened abdominal pain.    Past Medical History:  Diagnosis Date  . Allergy   . Anxiety   . Chickenpox   . Depression   . Dizziness, nonspecific    loss of balance last few months  . GERD (gastroesophageal reflux disease)   . Hearing loss    wears hearing aids  . Nephrolithiasis   . Normal cardiac stress test 09-20-12  . Osteoarthritis     Past Surgical History:  Procedure Laterality Date  . CATARACT EXTRACTION  2017  . CHOLECYSTECTOMY N/A 02/18/2016   Procedure: LAPAROSCOPIC CHOLECYSTECTOMY WITH POSSIBLE INTRAOPERATIVE CHOLANGIOGRAM;  Surgeon: Mickeal Skinner, MD;  Location: [facility];  Service: General;  Laterality: N/A;  . ERCP N/A 02/16/2016   Procedure: ENDOSCOPIC RETROGRADE CHOLANGIOPANCREATOGRAPHY (ERCP);  Surgeon: Teena Irani, MD;  Location: [facility] ENDOSCOPY;  Service: Endoscopy;  Laterality: N/A;  . HERNIA REPAIR     wears truss  . MASTOIDECTOMY      Family History  Problem Relation Age of Onset  . Heart disease Mother     Pacemaker  . Colon cancer Father    Social History:  reports that he has quit smoking. He has never used smokeless tobacco. He reports that he drinks about 10.8 oz of alcohol per week . He reports that he does not use drugs.  Allergies: No Known Allergies  Medications Prior to Admission  Medication Sig Dispense Refill  . aspirin 81 MG tablet Take 81 mg by mouth daily.    . citalopram (CELEXA) 10 MG tablet Take 1 tablet (10 mg total) by mouth daily. (Patient taking differently: Take 10 mg by mouth at bedtime. ) 30 tablet 11  . gabapentin (NEURONTIN) 100 MG capsule TAKE 1 CAPSULE AT BEDTIME. 30 capsule 11  . levothyroxine (SYNTHROID, LEVOTHROID) 50 MCG tablet Take 1 tablet (50 mcg total) by mouth daily. 90 tablet 3  . LORazepam (ATIVAN) 1 MG tablet TAKE 1 TABLET AT BEDTIME AS  NEEDED. (Patient taking differently: TAKE 1 TABLET AT BEDTIME) 30 tablet 5  . magnesium oxide (MAG-OX) 400 MG tablet Take 400 mg by mouth daily.    . Multiple Vitamin (MULTIVITAMIN WITH MINERALS) TABS tablet Take 1 tablet by mouth daily. Centrum Silver    . omeprazole (PRILOSEC) 40 MG capsule TAKE 1 CAPSULE DAILY. 90 capsule 1  . Saw Palmetto, Serenoa repens, (SAW PALMETTO PO) Take 1,200 mg by mouth 2 (two) times daily.    . psyllium (HYDROCIL/METAMUCIL) 95 % PACK Take 1 packet by mouth daily.     Marland Kitchen senna-docusate (SENOKOT-S) 8.6-50 MG tablet Take 1 tablet by mouth 2 (two) times daily as needed for mild constipation. (Patient not taking: Reported on 06/12/2016) 10 tablet 0  . simethicone (MYLICON) 80 MG chewable tablet Chew 1 tablet (80 mg total) by mouth every 6 (six) hours as needed for flatulence. (Patient not taking: Reported on 06/12/2016) 30 tablet 0    No results found for this or any previous visit (from the past 48 hour(s)). No results found.  Review of Systems  Constitutional: Negative for chills and fever.  HENT: Negative for hearing loss.   Eyes: Negative for blurred vision and double vision.  Respiratory: Negative for cough and hemoptysis.   Cardiovascular: Negative for chest pain and palpitations.  Gastrointestinal: Positive for abdominal pain. Negative for nausea and  vomiting.  Genitourinary: Negative for dysuria and urgency.  Musculoskeletal: Negative for myalgias and neck pain.  Skin: Negative for itching and rash.  Neurological: Negative for dizziness, tingling and headaches.  Endo/Heme/Allergies: Does not bruise/bleed easily.  Psychiatric/Behavioral: Negative for depression and suicidal ideas.    Blood pressure (!) 149/68, pulse 69, temperature 97.5 F (36.4 C), temperature source Oral, resp. rate 18, height 5\' 6"  (1.676 m), weight 64.4 kg (142 lb), SpO2 100 %. Physical Exam  Vitals reviewed. Constitutional: He is oriented to person, place, and time. He appears  well-developed and well-nourished.  HENT:  Head: Normocephalic and atraumatic.  Eyes: Conjunctivae and EOM are normal. Pupils are equal, round, and reactive to light.  Neck: Normal range of motion. Neck supple.  Cardiovascular: Normal rate and regular rhythm.   Respiratory: Effort normal and breath sounds normal.  GI: Soft. Bowel sounds are normal. He exhibits no distension. There is no tenderness.  Right inguinal hernia  Musculoskeletal: Normal range of motion.  Neurological: He is alert and oriented to person, place, and time.  Skin: Skin is warm and dry.  Psychiatric: He has a normal mood and affect. His behavior is normal.     Assessment/Plan 83 yo male with symptomatic right inguinal hernia -open inguinal hernia repair -obs due to age  Mickeal Skinner, MD 06/21/2016, 11:44 AM

## 2016-06-21 NOTE — Transfer of Care (Signed)
Immediate Anesthesia Transfer of Care Note  Patient: Allen Wheeler  Procedure(s) Performed: Procedure(s): OPEN RIGHT INGUINAL HERNIA REPAIR (Right) INSERTION OF MESH (Right)  Patient Location: PACU  Anesthesia Type:General  Level of Consciousness:  sedated, patient cooperative and responds to stimulation  Airway & Oxygen Therapy:Patient Spontanous Breathing and Patient connected to face mask oxgen  Post-op Assessment:  Report given to PACU RN and Post -op Vital signs reviewed and stable  Post vital signs:  Reviewed and stable  Last Vitals:  Vitals:   06/21/16 0933  BP: (!) 149/68  Pulse: 69  Resp: 18  Temp: 13.0 C    Complications: No apparent anesthesia complications

## 2016-06-21 NOTE — Anesthesia Postprocedure Evaluation (Addendum)
Anesthesia Post Note  Patient: Allen Wheeler  Procedure(s) Performed: Procedure(s) (LRB): OPEN RIGHT INGUINAL HERNIA REPAIR (Right) INSERTION OF MESH (Right)  Patient location during evaluation: PACU Anesthesia Type: General Level of consciousness: sedated Pain management: pain level controlled Vital Signs Assessment: post-procedure vital signs reviewed and stable Respiratory status: spontaneous breathing and respiratory function stable Cardiovascular status: stable Anesthetic complications: no       Last Vitals:  Vitals:   06/21/16 1415 06/21/16 1430  BP: 119/64 126/71  Pulse: 63 64  Resp: 14 11  Temp:      Last Pain:  Vitals:   06/21/16 1430  TempSrc:   PainSc: 4                  Babs Dabbs DANIEL

## 2016-06-22 ENCOUNTER — Encounter (HOSPITAL_COMMUNITY): Payer: Self-pay | Admitting: General Surgery

## 2016-06-22 DIAGNOSIS — K219 Gastro-esophageal reflux disease without esophagitis: Secondary | ICD-10-CM | POA: Diagnosis not present

## 2016-06-22 DIAGNOSIS — K4090 Unilateral inguinal hernia, without obstruction or gangrene, not specified as recurrent: Secondary | ICD-10-CM | POA: Diagnosis not present

## 2016-06-22 DIAGNOSIS — F419 Anxiety disorder, unspecified: Secondary | ICD-10-CM | POA: Diagnosis not present

## 2016-06-22 DIAGNOSIS — K4190 Unilateral femoral hernia, without obstruction or gangrene, not specified as recurrent: Secondary | ICD-10-CM | POA: Diagnosis not present

## 2016-06-22 DIAGNOSIS — E039 Hypothyroidism, unspecified: Secondary | ICD-10-CM | POA: Diagnosis not present

## 2016-06-22 DIAGNOSIS — F329 Major depressive disorder, single episode, unspecified: Secondary | ICD-10-CM | POA: Diagnosis not present

## 2016-06-22 LAB — CBC
HCT: 39.3 % (ref 39.0–52.0)
Hemoglobin: 13.4 g/dL (ref 13.0–17.0)
MCH: 32.4 pg (ref 26.0–34.0)
MCHC: 34.1 g/dL (ref 30.0–36.0)
MCV: 94.9 fL (ref 78.0–100.0)
PLATELETS: 196 10*3/uL (ref 150–400)
RBC: 4.14 MIL/uL — ABNORMAL LOW (ref 4.22–5.81)
RDW: 13.5 % (ref 11.5–15.5)
WBC: 7 10*3/uL (ref 4.0–10.5)

## 2016-06-22 MED ORDER — HYDROCODONE-ACETAMINOPHEN 5-325 MG PO TABS: 1.0000 | ORAL_TABLET | Freq: Four times a day (QID) | ORAL | 0 refills | Status: DC | PRN

## 2016-06-22 NOTE — Progress Notes (Signed)
Pt was ambulating independently in the hall and he tolerated his breakfast with no nausea.  D/C instructions were given and he verbalized understanding. Prescription was given.

## 2016-06-22 NOTE — Discharge Summary (Signed)
Physician Discharge Summary  Allen Wheeler DDU:[id number] DOB: [birth year] DOA: 06/21/2016  PCP: Alysia Penna, MD  Admit date: 06/21/2016 Discharge date: 06/22/2016  Recommendations for Outpatient Follow-up:  1.  (include homehealth, outpatient follow-up instructions, specific recommendations for PCP to follow-up on, etc.)  Follow-up Information    [name] [name], MD Follow up in 3 week(s).   Specialty:  General Surgery Contact information: [place] [address] [phone]          Discharge Diagnoses:  Active Problems:   Inguinal hernia   Surgical Procedure: right open inguinal hernia repair  Discharge Condition: Good Disposition: Home  Diet recommendation: heart healthy diet   Hospital Course:  83 yo male presented for right inguinal hernia repair. He did well post op. He was able to ambulate POD 1 and was discharged home POD 1.  Discharge Instructions  Discharge Instructions    Call MD for:  difficulty breathing, headache or visual disturbances    Complete by:  As directed    Call MD for:  hives    Complete by:  As directed    Call MD for:  persistant nausea and vomiting    Complete by:  As directed    Call MD for:  redness, tenderness, or signs of infection (pain, swelling, redness, odor or green/yellow discharge around incision site)    Complete by:  As directed    Call MD for:  severe uncontrolled pain    Complete by:  As directed    Call MD for:  temperature >100.4    Complete by:  As directed    Diet - low sodium heart healthy    Complete by:  As directed    Discharge wound care:    Complete by:  As directed    Ok to shower tomorrow. Steristrips will likely peel off in 1-3 weeks. No bandage required   Driving Restrictions    Complete by:  As directed    No driving while on narcotics   Increase activity slowly    Complete by:  As directed    Lifting restrictions    Complete by:  As directed    No lifting greater  than 20 pounds for 3 weeks     Allergies as of 06/22/2016   No Known Allergies     Medication List    TAKE these medications   aspirin 81 MG tablet Take 81 mg by mouth daily.   citalopram 10 MG tablet Commonly known as:  CELEXA Take 1 tablet (10 mg total) by mouth daily. What changed:  when to take this   gabapentin 100 MG capsule Commonly known as:  NEURONTIN TAKE 1 CAPSULE AT BEDTIME.   HYDROcodone-acetaminophen 5-325 MG tablet Commonly known as:  NORCO/VICODIN Take 1-2 tablets by mouth every 6 (six) hours as needed for moderate pain.   levothyroxine 50 MCG tablet Commonly known as:  SYNTHROID, LEVOTHROID Take 1 tablet (50 mcg total) by mouth daily.   LORazepam 1 MG tablet Commonly known as:  ATIVAN TAKE 1 TABLET AT BEDTIME AS NEEDED. What changed:  See the new instructions.   magnesium oxide 400 MG tablet Commonly known as:  MAG-OX Take 400 mg by mouth daily.   multivitamin with minerals Tabs tablet Take 1 tablet by mouth daily. Centrum Silver   omeprazole 40 MG capsule Commonly known as:  PRILOSEC TAKE 1 CAPSULE DAILY.   psyllium 95 % Pack Commonly known as:  HYDROCIL/METAMUCIL Take 1 packet by mouth  daily.   SAW PALMETTO PO Take 1,200 mg by mouth 2 (two) times daily.   senna-docusate 8.6-50 MG tablet Commonly known as:  Senokot-S Take 1 tablet by mouth 2 (two) times daily as needed for mild constipation.   simethicone 80 MG chewable tablet Commonly known as:  MYLICON Chew 1 tablet (80 mg total) by mouth every 6 (six) hours as needed for flatulence.      Follow-up Information    [name] Klint Lezcano, MD Follow up in 3 week(s).   Specialty:  General Surgery Contact information: [place] [address] [phone]            The results of significant diagnostics from this hospitalization (including imaging, microbiology, ancillary and laboratory) are listed below for reference.    Significant Diagnostic  Studies: No results found.  Labs: Basic Metabolic Panel:  Recent Labs Lab 06/15/16 1025 06/21/16 1536  NA 138  --   K 5.2*  --   CL 106  --   CO2 25  --   GLUCOSE 53*  --   BUN 17  --   CREATININE 1.22 1.30*  CALCIUM 9.2  --    Liver Function Tests: No results for input(s): AST, ALT, ALKPHOS, BILITOT, PROT, ALBUMIN in the last 168 hours.  CBC:  Recent Labs Lab 06/15/16 1025 06/21/16 1536 06/22/16 0426  WBC 6.7 8.2 7.0  NEUTROABS 3.8  --   --   HGB 13.1 13.6 13.4  HCT 39.3 40.0 39.3  MCV 95.6 94.3 94.9  PLT 201 224 196    CBG: No results for input(s): GLUCAP in the last 168 hours.  Active Problems:   Inguinal hernia   Time coordinating discharge: <60min

## 2016-06-26 DIAGNOSIS — H906 Mixed conductive and sensorineural hearing loss, bilateral: Secondary | ICD-10-CM | POA: Diagnosis not present

## 2016-06-26 DIAGNOSIS — H7291 Unspecified perforation of tympanic membrane, right ear: Secondary | ICD-10-CM | POA: Diagnosis not present

## 2016-06-26 DIAGNOSIS — Z87891 Personal history of nicotine dependence: Secondary | ICD-10-CM | POA: Diagnosis not present

## 2016-06-26 DIAGNOSIS — Z974 Presence of external hearing-aid: Secondary | ICD-10-CM | POA: Diagnosis not present

## 2016-06-26 DIAGNOSIS — Z7289 Other problems related to lifestyle: Secondary | ICD-10-CM | POA: Diagnosis not present

## 2016-06-26 DIAGNOSIS — H6122 Impacted cerumen, left ear: Secondary | ICD-10-CM | POA: Diagnosis not present

## 2016-07-03 ENCOUNTER — Encounter: Payer: Self-pay | Admitting: Family Medicine

## 2016-07-03 ENCOUNTER — Ambulatory Visit (INDEPENDENT_AMBULATORY_CARE_PROVIDER_SITE_OTHER): Payer: Medicare Other | Admitting: Family Medicine

## 2016-07-03 VITALS — BP 130/76 | HR 61 | Ht 66.0 in | Wt 144.0 lb

## 2016-07-03 DIAGNOSIS — N138 Other obstructive and reflux uropathy: Secondary | ICD-10-CM

## 2016-07-03 DIAGNOSIS — L299 Pruritus, unspecified: Secondary | ICD-10-CM | POA: Diagnosis not present

## 2016-07-03 DIAGNOSIS — N401 Enlarged prostate with lower urinary tract symptoms: Secondary | ICD-10-CM | POA: Diagnosis not present

## 2016-07-03 MED ORDER — TAMSULOSIN HCL 0.4 MG PO CAPS: 0.4000 mg | ORAL_CAPSULE | Freq: Every day | ORAL | 3 refills | Status: DC

## 2016-07-03 MED ORDER — METHYLPREDNISOLONE ACETATE 80 MG/ML IJ SUSP
120.0000 mg | Freq: Once | INTRAMUSCULAR | Status: AC
  Administered 2016-07-03: 120 mg via INTRAMUSCULAR

## 2016-07-03 NOTE — Addendum Note (Signed)
Addended by: Aggie Hacker A on: 07/03/2016 12:13 PM   Modules accepted: Orders

## 2016-07-03 NOTE — Progress Notes (Signed)
Pre visit review using our clinic review tool, if applicable. No additional management support is needed unless otherwise documented below in the visit note. 

## 2016-07-03 NOTE — Patient Instructions (Signed)
WE NOW OFFER   [facility] FAST TRACK!!!  SAME DAY Appointments for ACUTE CARE  Such as: Sprains, Injuries, cuts, abrasions, rashes, muscle pain, joint pain, back pain Colds, flu, sore throats, headache, allergies, cough, fever  Ear pain, sinus and eye infections Abdominal pain, nausea, vomiting, diarrhea, upset stomach Animal/insect bites  3 Easy Ways to Schedule: Walk-In Scheduling Call in scheduling Mychart Sign-up: [email or web address]         

## 2016-07-03 NOTE — Progress Notes (Signed)
   Subjective:    Patient ID: ELZA SORTOR, male    DOB: 1919-11-28, 83 y.o.   MRN: [id number]  HPI Here for diffuse itching which typically bothers him in the spring and fall. He responds well to a shot of prednisone. He also asks about trouble urinating. He has a slow stream and has to urinate frequently. No discomfort. He has been trying saw palmetto OTC with poor results.    Review of Systems  Constitutional: Negative.   Respiratory: Negative.   Cardiovascular: Negative.   Genitourinary: Positive for difficulty urinating, frequency and urgency. Negative for dysuria.  Skin: Negative for rash.       Objective:   Physical Exam  Constitutional: He is oriented to person, place, and time. He appears well-developed and well-nourished.  Cardiovascular: Normal rate, regular rhythm, normal heart sounds and intact distal pulses.   Pulmonary/Chest: Effort normal and breath sounds normal.  Neurological: He is alert and oriented to person, place, and time.          Assessment & Plan:  He is given a shot of DepoMedrol for the itching. He will stop saw palmetto and try Flomax for the BPH. Alysia Penna, MD

## 2016-07-16 ENCOUNTER — Emergency Department (HOSPITAL_COMMUNITY): Payer: Medicare Other

## 2016-07-16 ENCOUNTER — Emergency Department (HOSPITAL_COMMUNITY)
Admission: EM | Admit: 2016-07-16 | Discharge: 2016-07-16 | Disposition: A | Discharge status: Home / Self Care | Payer: Medicare Other | Attending: Emergency Medicine | Admitting: Emergency Medicine

## 2016-07-16 DIAGNOSIS — Z87891 Personal history of nicotine dependence: Secondary | ICD-10-CM | POA: Diagnosis not present

## 2016-07-16 DIAGNOSIS — Z7982 Long term (current) use of aspirin: Secondary | ICD-10-CM | POA: Diagnosis not present

## 2016-07-16 DIAGNOSIS — K5909 Other constipation: Secondary | ICD-10-CM | POA: Diagnosis not present

## 2016-07-16 DIAGNOSIS — M5489 Other dorsalgia: Secondary | ICD-10-CM | POA: Diagnosis not present

## 2016-07-16 DIAGNOSIS — Y999 Unspecified external cause status: Secondary | ICD-10-CM | POA: Diagnosis not present

## 2016-07-16 DIAGNOSIS — S32048A Other fracture of fourth lumbar vertebra, initial encounter for closed fracture: Secondary | ICD-10-CM | POA: Insufficient documentation

## 2016-07-16 DIAGNOSIS — S32040A Wedge compression fracture of fourth lumbar vertebra, initial encounter for closed fracture: Secondary | ICD-10-CM | POA: Diagnosis not present

## 2016-07-16 DIAGNOSIS — W07XXXA Fall from chair, initial encounter: Secondary | ICD-10-CM | POA: Diagnosis not present

## 2016-07-16 DIAGNOSIS — E039 Hypothyroidism, unspecified: Secondary | ICD-10-CM | POA: Insufficient documentation

## 2016-07-16 DIAGNOSIS — Y939 Activity, unspecified: Secondary | ICD-10-CM | POA: Insufficient documentation

## 2016-07-16 DIAGNOSIS — S32049A Unspecified fracture of fourth lumbar vertebra, initial encounter for closed fracture: Secondary | ICD-10-CM | POA: Diagnosis not present

## 2016-07-16 DIAGNOSIS — Y929 Unspecified place or not applicable: Secondary | ICD-10-CM | POA: Diagnosis not present

## 2016-07-16 DIAGNOSIS — S3992XA Unspecified injury of lower back, initial encounter: Secondary | ICD-10-CM | POA: Diagnosis present

## 2016-07-16 LAB — CBC WITH DIFFERENTIAL/PLATELET
Basophils Absolute: 0 10*3/uL (ref 0.0–0.1)
Basophils Relative: 0 %
Eosinophils Absolute: 0 10*3/uL (ref 0.0–0.7)
Eosinophils Relative: 0 %
HCT: 41.9 % (ref 39.0–52.0)
Hemoglobin: 14.5 g/dL (ref 13.0–17.0)
Lymphocytes Relative: 10 %
Lymphs Abs: 1.6 10*3/uL (ref 0.7–4.0)
MCH: 32.6 pg (ref 26.0–34.0)
MCHC: 34.6 g/dL (ref 30.0–36.0)
MCV: 94.2 fL (ref 78.0–100.0)
Monocytes Absolute: 0.7 10*3/uL (ref 0.1–1.0)
Monocytes Relative: 5 %
Neutro Abs: 13 10*3/uL — ABNORMAL HIGH (ref 1.7–7.7)
Neutrophils Relative %: 85 %
Platelets: 213 10*3/uL (ref 150–400)
RBC: 4.45 MIL/uL (ref 4.22–5.81)
RDW: 13.4 % (ref 11.5–15.5)
WBC: 15.3 10*3/uL — ABNORMAL HIGH (ref 4.0–10.5)

## 2016-07-16 LAB — URINALYSIS, ROUTINE W REFLEX MICROSCOPIC
Bacteria, UA: NONE SEEN
Bilirubin Urine: NEGATIVE
Glucose, UA: NEGATIVE mg/dL
Hgb urine dipstick: NEGATIVE
Ketones, ur: NEGATIVE mg/dL
Leukocytes, UA: NEGATIVE
Nitrite: NEGATIVE
Protein, ur: 30 mg/dL — AB
Specific Gravity, Urine: 1.027 (ref 1.005–1.030)
Squamous Epithelial / LPF: NONE SEEN
pH: 5 (ref 5.0–8.0)

## 2016-07-16 LAB — COMPREHENSIVE METABOLIC PANEL
ALT: 19 U/L (ref 17–63)
AST: 27 U/L (ref 15–41)
Albumin: 4.1 g/dL (ref 3.5–5.0)
Alkaline Phosphatase: 76 U/L (ref 38–126)
Anion gap: 10 (ref 5–15)
BUN: 37 mg/dL — ABNORMAL HIGH (ref 6–20)
CO2: 23 mmol/L (ref 22–32)
Calcium: 9.3 mg/dL (ref 8.9–10.3)
Chloride: 105 mmol/L (ref 101–111)
Creatinine, Ser: 1.27 mg/dL — ABNORMAL HIGH (ref 0.61–1.24)
GFR calc Af Amer: 54 mL/min — ABNORMAL LOW (ref >60)
GFR calc non Af Amer: 47 mL/min — ABNORMAL LOW (ref >60)
Glucose, Bld: 127 mg/dL — ABNORMAL HIGH (ref 65–99)
Potassium: 4.1 mmol/L (ref 3.5–5.1)
Sodium: 138 mmol/L (ref 135–145)
Total Bilirubin: 1.1 mg/dL (ref 0.3–1.2)
Total Protein: 7.9 g/dL (ref 6.5–8.1)

## 2016-07-16 MED ORDER — DOCUSATE SODIUM 100 MG PO CAPS: 100.0000 mg | ORAL_CAPSULE | Freq: Two times a day (BID) | ORAL | 0 refills | Status: DC

## 2016-07-16 NOTE — ED Notes (Signed)
Assisted pt in putting on brief

## 2016-07-16 NOTE — ED Notes (Signed)
Patient states he had a right inguinal hernia repair with mesh done and hopes that him falling has not complicated things. Denies changes in abdomen. States "my abdomen has been bloated for the past 2 years and the doctors are aware. They told me after testing that I was just bloated no medical condition for it"

## 2016-07-16 NOTE — ED Triage Notes (Signed)
Chair collapsed with him a week ago and now lower back pain with constipation due to narcotics states pain improved with standing.

## 2016-07-16 NOTE — ED Notes (Signed)
Pt came back to triage desk cursing and mad about wait time informed pt that he would be next states I can just leave.

## 2016-07-16 NOTE — ED Provider Notes (Signed)
[facility] DEPT Provider Note   CSN: [id number] Arrival date & time: 07/16/16  0019     History   Chief Complaint Chief Complaint  Patient presents with  . Back Pain    HPI Allen Wheeler is a 83 y.o. male with history of hernia repair around 4 weeks ago who presents with a one-day history of rectal pain. Patient describes he is also had involuntary, bowel incontinence since the time of onset yesterday. He denies diarrhea, and states he has had mostly normal bowel movements. Patient has intermittent waves of pain. He feels the urge to use the restroom, but has severe rectal pain. Patient reports he was visiting [place] last week and had a fall backwards, from an unstable chair. Patient was not evaluated at the time and walked around the next 3 days without a problem in [place]. He has felt sore in his back and has pain in his low back with bending, but not with standing straight up. Patient denies any urinary issues. He denies any fevers, chest pain, shortness of breath, abdominal pain. He has taken Tylenol for his pain without significant relief.  HPI  Past Medical History:  Diagnosis Date  . Allergy   . Anxiety   . Chickenpox   . Depression   . Dizziness, nonspecific    loss of balance last few months  . GERD (gastroesophageal reflux disease)   . Hearing loss    wears hearing aids  . Nephrolithiasis   . Normal cardiac stress test 09-20-12  . Osteoarthritis     Patient Active Problem List   Diagnosis Date Noted  . Inguinal hernia 06/21/2016  . Right inguinal hernia 04/19/2016  . Left hand weakness   . Sepsis ([place]) 02/15/2016  . Cholelithiasis with acute cholangitis with biliary obstruction 02/14/2016  . Hypothyroidism 12/13/2015  . Eczema 09/16/2014  . Abnormal ECG 09/03/2012  . SOMNOLENCE 10/31/2009  . OTITIS EXTERNA 07/04/2009  . BPH with urinary obstruction 02/16/2009  . Pruritic disorder 02/16/2009  . WEAKNESS 02/16/2009  . ELEVATED BLOOD  PRESSURE 02/16/2009  . Anxiety 05/27/2008  . Depression 05/27/2008  . ALLERGIC RHINITIS 05/27/2008  . GERD 05/27/2008  . Osteoarthritis 05/27/2008  . NEPHROLITHIASIS, HX OF 05/27/2008    Past Surgical History:  Procedure Laterality Date  . CATARACT EXTRACTION  2017  . CHOLECYSTECTOMY N/A 02/18/2016   Procedure: LAPAROSCOPIC CHOLECYSTECTOMY WITH POSSIBLE INTRAOPERATIVE CHOLANGIOGRAM;  Surgeon: Mickeal Skinner, MD;  Location: [facility];  Service: General;  Laterality: N/A;  . ERCP N/A 02/16/2016   Procedure: ENDOSCOPIC RETROGRADE CHOLANGIOPANCREATOGRAPHY (ERCP);  Surgeon: Teena Irani, MD;  Location: [facility];  Service: Endoscopy;  Laterality: N/A;  . HERNIA REPAIR     wears truss  . INGUINAL HERNIA REPAIR Right 06/21/2016   Procedure: OPEN RIGHT INGUINAL HERNIA REPAIR;  Surgeon: [name] Kinsinger, MD;  Location: [facility];  Service: General;  Laterality: Right;  . INSERTION OF MESH Right 06/21/2016   Procedure: INSERTION OF MESH;  Surgeon: [name] Kinsinger, MD;  Location: [facility];  Service: General;  Laterality: Right;  . MASTOIDECTOMY         Home Medications    Prior to Admission medications   Medication Sig Start Date End Date Taking? Authorizing Provider  aspirin 81 MG tablet Take 81 mg by mouth daily.   Yes Historical Provider, MD  citalopram (CELEXA) 10 MG tablet Take 1 tablet (10 mg total) by mouth daily. Patient taking differently: Take 10 mg by mouth at bedtime.  07/25/15  Yes [name], MD  gabapentin (NEURONTIN) 100 MG capsule TAKE 1 CAPSULE AT BEDTIME. 03/27/16  Yes [name], MD  levothyroxine (SYNTHROID, LEVOTHROID) 50 MCG tablet Take 1 tablet (50 mcg total) by mouth daily. 07/28/15  Yes [name], MD  LORazepam (ATIVAN) 1 MG tablet TAKE 1 TABLET AT BEDTIME AS NEEDED. Patient taking differently: TAKE 1 TABLET AT BEDTIME 05/25/16  Yes [name], MD  magnesium oxide (MAG-OX) 400 MG tablet Take 400 mg by mouth daily.   Yes Historical Provider, MD    Multiple Vitamin (MULTIVITAMIN WITH MINERALS) TABS tablet Take 1 tablet by mouth daily. Centrum Silver   Yes Historical Provider, MD  omeprazole (PRILOSEC) 40 MG capsule TAKE 1 CAPSULE DAILY. 01/16/16  Yes [name], MD  tamsulosin (FLOMAX) 0.4 MG CAPS capsule Take 1 capsule (0.4 mg total) by mouth daily. 07/03/16  Yes [name], MD  docusate sodium (COLACE) 100 MG capsule Take 1 capsule (100 mg total) by mouth every 12 (twelve) hours. 07/16/16   Frederica Kuster, PA-C  HYDROcodone-acetaminophen (NORCO/VICODIN) 5-325 MG tablet Take 1-2 tablets by mouth every 6 (six) hours as needed for moderate pain. Patient not taking: Reported on 07/16/2016 06/22/16   [name] Kinsinger, MD  senna-docusate (SENOKOT-S) 8.6-50 MG tablet Take 1 tablet by mouth 2 (two) times daily as needed for mild constipation. Patient not taking: Reported on 06/12/2016 02/19/16   Ripudeep Krystal Eaton, MD  simethicone (MYLICON) 80 MG chewable tablet Chew 1 tablet (80 mg total) by mouth every 6 (six) hours as needed for flatulence. Patient not taking: Reported on 06/12/2016 02/19/16   Ripudeep Krystal Eaton, MD    Family History Family History  Problem Relation Age of Onset  . Heart disease Mother     Pacemaker  . Colon cancer Father     Social History Social History  Substance Use Topics  . Smoking status: Former Research scientist (life sciences)  . Smokeless tobacco: Never Used  . Alcohol use 10.8 oz/week    14 Shots of liquor, 4 Glasses of wine per week     Allergies   Patient has no known allergies.   Review of Systems Review of Systems  Constitutional: Negative for chills and fever.  HENT: Negative for facial swelling and sore throat.   Respiratory: Negative for shortness of breath.   Cardiovascular: Negative for chest pain.  Gastrointestinal: Negative for abdominal pain, blood in stool, nausea and vomiting.  Genitourinary: Negative for dysuria.  Musculoskeletal: Negative for back pain.  Skin: Negative for rash and wound.  Neurological:  Negative for headaches.  Psychiatric/Behavioral: The patient is not nervous/anxious.      Physical Exam Updated Vital Signs BP (!) 142/71   Pulse 71   Temp 98.5 F (36.9 C)   Resp 18   Ht 5\' 9"  (1.753 m)   Wt 65.3 kg   SpO2 99%   BMI 21.27 kg/m   Physical Exam  Constitutional: He appears well-developed and well-nourished. No distress.  HENT:  Head: Normocephalic and atraumatic.  Mouth/Throat: Oropharynx is clear and moist. No oropharyngeal exudate.  Eyes: Conjunctivae are normal. Pupils are equal, round, and reactive to light. Right eye exhibits no discharge. Left eye exhibits no discharge. No scleral icterus.  Neck: Normal range of motion. Neck supple. No thyromegaly present.  Cardiovascular: Normal rate, regular rhythm, normal heart sounds and intact distal pulses.  Exam reveals no gallop and no friction rub.   No murmur heard. Pulmonary/Chest: Effort normal and breath  sounds normal. No stridor. No respiratory distress. He has no wheezes. He has no rales.  Abdominal: Soft. Bowel sounds are normal. He exhibits no distension. There is no tenderness. There is no rebound and no guarding.  Musculoskeletal: He exhibits no edema.  No midline cervical, thoracic, or lumbar tenderness  Lymphadenopathy:    He has no cervical adenopathy.  Neurological: He is alert. Coordination normal.  Skin: Skin is warm and dry. No rash noted. He is not diaphoretic. No pallor.  Psychiatric: He has a normal mood and affect.  Nursing note and vitals reviewed.    ED Treatments / Results  Labs (all labs ordered are listed, but only abnormal results are displayed) Labs Reviewed  COMPREHENSIVE METABOLIC PANEL - Abnormal; Notable for the following:       Result Value   Glucose, Bld 127 (*)    BUN 37 (*)    Creatinine, Ser 1.27 (*)    GFR calc non Af Amer 47 (*)    GFR calc Af Amer 54 (*)    All other components within normal limits  CBC WITH DIFFERENTIAL/PLATELET - Abnormal; Notable for the  following:    WBC 15.3 (*)    Neutro Abs 13.0 (*)    All other components within normal limits  URINALYSIS, ROUTINE W REFLEX MICROSCOPIC - Abnormal; Notable for the following:    Protein, ur 30 (*)    All other components within normal limits    EKG  EKG Interpretation None       Radiology Dg Lumbar Spine Complete  Result Date: 07/16/2016 CLINICAL DATA:  83 year old male sat down 10 days ago, chair collapsed. Patient fell on floor. Right-sided pain. EXAM: LUMBAR SPINE - COMPLETE 4+ VIEW COMPARISON:  02/14/2016 CT. FINDINGS: L4 superior endplate mild compression fracture with 10% loss of height mid to anterior aspect. This is new compared to the prior CT. No retropulsion. L5-S1 moderate to marked disc space narrowing. Facet degenerative changes L4-5 and L5-S1 without pars defect. Vascular calcifications with ectatic abdominal aorta and iliac arteries incompletely assessed on the present exam. Gas-filled top-normal size small bowel loops. IMPRESSION: L4 superior endplate mild compression fracture with 10% loss of height mid to anterior aspect. This is new compared to the prior CT. No retropulsion. L5-S1 moderate to marked disc space narrowing. Facet degenerative changes L4-5 and L5-S1. Vascular calcifications with ectatic abdominal aorta and iliac arteries incompletely assessed on the present exam. Electronically Signed   By: Genia Del M.D.   On: 07/16/2016 08:12    Procedures Procedures (including critical care time)  Medications Ordered in ED Medications - No data to display   Initial Impression / Assessment and Plan / ED Course  I have reviewed the triage vital signs and the nursing notes.  Pertinent labs & imaging results that were available during my care of the patient were reviewed by me and considered in my medical decision making (see chart for details).  Clinical Course as of Jul 17 1554  Mon Jul 16, 2016  0841 New L4 mild compression fracture DG Lumbar Spine Complete  [EW]    Clinical Course User Index [EW] Daleen Bo, MD    Patient with L4 compression fracture with 10% loss of height without retropulsion, potentially causing patient's constipation. Patient without any abdominal tenderness. Normal sensation to lower extremities and patient ambulating. No saddle anesthesia. Normal sphincter tone on rectal exam performed by Dr. Eulis Foster. Patient advised to take Tylenol and follow-up to PCP as needed. Patient also prescribed Colace  twice daily indefinitely. Return precautions discussed. Patient understands and agrees with plan. Patient vitals stable throughout ED course and discharged in satisfactory condition. Patient also evaluated by Dr. Eulis Foster who guided the patient's management and agrees with plan.  Final Clinical Impressions(s) / ED Diagnoses   Final diagnoses:  Closed compression fracture of fourth lumbar vertebra, initial encounter [facility])  Other constipation    New Prescriptions Discharge Medication List as of 07/16/2016  9:22 AM    START taking these medications   Details  docusate sodium (COLACE) 100 MG capsule Take 1 capsule (100 mg total) by mouth every 12 (twelve) hours., Starting Mon 07/16/2016, Print         Frederica Kuster, PA-C 07/16/16 [place], MD 07/17/16 [phone]

## 2016-07-16 NOTE — Discharge Instructions (Signed)
Medications: Colace  Treatment: Take Tylenol as prescribed over-the-counter for your pain. Take Colace twice daily as prescribed for your constipation. Continue this to help prevent this problem in the future.  Follow-up: Please follow-up with your primary care provider as needed. Please return to the emergency department if you develop any new or worsening symptoms.

## 2016-07-16 NOTE — ED Provider Notes (Signed)
  Face-to-face evaluation   History: Patient presents for evaluation of crampy abdominal pain which started about 10 hours ago, and has resolved at the time I saw him.  This was preceded by an injury last week, falling, injuring his right lower back.  No preceding constipation nausea vomiting or fever.  Recent right inguinal surgery repair, with mesh, about 1 month ago.  Physical exam: Alert elderly male, comfortable.  Abdomen-soft, nontender, no mass.  Anus- normal sphincter tone; moderate amount of soft brown stool in the rectal vault, no fecal impaction.  Clinical Course as of Jul 16 841  Mon Jul 16, 2016  4982 New L4 mild compression fracture DG Lumbar Spine Complete [EW]    Clinical Course User Index [EW] Daleen Bo, MD    Medical screening examination/treatment/procedure(s) were conducted as a shared visit with non-physician practitioner(s) and myself.  I personally evaluated the patient during the encounter    Daleen Bo, MD 07/17/16 [phone]

## 2016-07-17 ENCOUNTER — Other Ambulatory Visit: Payer: Self-pay | Admitting: Family Medicine

## 2016-07-23 ENCOUNTER — Other Ambulatory Visit: Payer: Self-pay | Admitting: Family Medicine

## 2016-07-23 DIAGNOSIS — E039 Hypothyroidism, unspecified: Secondary | ICD-10-CM

## 2016-07-25 ENCOUNTER — Other Ambulatory Visit: Payer: Self-pay | Admitting: Family Medicine

## 2016-08-13 DIAGNOSIS — Z961 Presence of intraocular lens: Secondary | ICD-10-CM | POA: Diagnosis not present

## 2016-08-13 DIAGNOSIS — H35371 Puckering of macula, right eye: Secondary | ICD-10-CM | POA: Diagnosis not present

## 2016-09-08 NOTE — Addendum Note (Signed)
Addendum  created 09/08/16 1102 by Duane Boston, MD   Sign clinical note

## 2016-09-11 DIAGNOSIS — Z961 Presence of intraocular lens: Secondary | ICD-10-CM | POA: Diagnosis not present

## 2016-09-11 DIAGNOSIS — H35371 Puckering of macula, right eye: Secondary | ICD-10-CM | POA: Diagnosis not present

## 2016-10-18 DIAGNOSIS — H02005 Unspecified entropion of left lower eyelid: Secondary | ICD-10-CM | POA: Diagnosis not present

## 2016-10-18 DIAGNOSIS — H04202 Unspecified epiphora, left lacrimal gland: Secondary | ICD-10-CM | POA: Diagnosis not present

## 2016-10-25 DIAGNOSIS — H02135 Senile ectropion of left lower eyelid: Secondary | ICD-10-CM | POA: Diagnosis not present

## 2016-10-25 DIAGNOSIS — H04222 Epiphora due to insufficient drainage, left lacrimal gland: Secondary | ICD-10-CM | POA: Diagnosis not present

## 2016-10-29 DIAGNOSIS — H53489 Generalized contraction of visual field, unspecified eye: Secondary | ICD-10-CM | POA: Diagnosis not present

## 2016-11-05 ENCOUNTER — Ambulatory Visit: Payer: Medicare Other | Admitting: Family Medicine

## 2016-11-05 ENCOUNTER — Telehealth: Payer: Self-pay | Admitting: Family Medicine

## 2016-11-05 NOTE — Telephone Encounter (Signed)
[facility] Day - Client [place] [facility]  Patient Name: Allen Wheeler  DOB: 1920/02/27    Initial Comment Caller states having diarhea, asking what he can and can't eat   Nurse Assessment  Nurse: Harlow Mares, RN, Suanne Marker Date/Time Eilene Ghazi Time): 11/05/2016 12:48:32 PM  Confirm and document reason for call. If symptomatic, describe symptoms. ---Caller states having diarrhea, asking what he can and can't eat. 4 days of diarrhea, liquid stools. Reports that he is taking some Immodium with help.  Does the patient have any new or worsening symptoms? ---Yes  Will a triage be completed? ---Yes  Related visit to physician within the last 2 weeks? ---No  Does the PT have any chronic conditions? (i.e. diabetes, asthma, etc.) ---No  Is this a behavioral health or substance abuse call? ---No    Nurse: Harlow Mares, RN, Suanne Marker Date/Time (Eastern Time): 11/05/2016 12:56:25 PM  Confirm and document reason for call. If symptomatic, describe symptoms. ---feels that he is bruising easier while he is gardening.  Does the patient have any new or worsening symptoms? ---Yes  Will a triage be completed? ---Yes  Related visit to physician within the last 2 weeks? ---N/A  Does the PT have any chronic conditions? (i.e. diabetes, asthma, etc.) ---Unknown  Is this a behavioral health or substance abuse call? ---No     Guidelines    Guideline Title Affirmed Question Affirmed Notes  Diarrhea MILD-MODERATE diarrhea (e.g., 1-6 times / day more than normal)   Bruises [1] Not caused by an injury AND [2] < 5 unexplained bruises    Final Disposition User   See PCP When Office is Open (within 3 days) Harlow Mares, RN, Suanne Marker    Comments  Appt made for tomorrow with Dr. Sarajane Jews at 3:15pm at the [facility] office.   Referrals  REFERRED TO PCP OFFICE   Disagree/Comply: Comply    Disagree/Comply: Comply

## 2016-11-05 NOTE — Telephone Encounter (Signed)
Noted  

## 2016-11-06 ENCOUNTER — Ambulatory Visit (INDEPENDENT_AMBULATORY_CARE_PROVIDER_SITE_OTHER): Payer: Medicare Other | Admitting: Family Medicine

## 2016-11-06 ENCOUNTER — Encounter: Payer: Self-pay | Admitting: Family Medicine

## 2016-11-06 VITALS — BP 114/60 | Temp 98.0°F | Ht 69.0 in | Wt 147.0 lb

## 2016-11-06 DIAGNOSIS — K529 Noninfective gastroenteritis and colitis, unspecified: Secondary | ICD-10-CM | POA: Diagnosis not present

## 2016-11-06 NOTE — Patient Instructions (Signed)
WE NOW OFFER   [facility] FAST TRACK!!!  SAME DAY Appointments for ACUTE CARE  Such as: Sprains, Injuries, cuts, abrasions, rashes, muscle pain, joint pain, back pain Colds, flu, sore throats, headache, allergies, cough, fever  Ear pain, sinus and eye infections Abdominal pain, nausea, vomiting, diarrhea, upset stomach Animal/insect bites  3 Easy Ways to Schedule: Walk-In Scheduling Call in scheduling Mychart Sign-up: [email or web address]         

## 2016-11-06 NOTE — Progress Notes (Signed)
   Subjective:    Patient ID: Allen Wheeler, male    DOB: [birth year], 83 y.o.   MRN: [id number]  HPI Here for 6 days of diarrhea that has actually improved in the past 24 hours. His last BM was last night. He had mild cramping with this but the cramps have stopped. No fever. No nausea or vomiting. He has been drinking water and eating bland foods. No recent travel.    Review of Systems  Constitutional: Negative.   Respiratory: Negative.   Cardiovascular: Negative.   Gastrointestinal: Positive for diarrhea. Negative for abdominal distention, abdominal pain, anal bleeding, blood in stool, constipation, nausea, rectal pain and vomiting.  Genitourinary: Negative.        Objective:   Physical Exam  Constitutional: He appears well-developed and well-nourished. No distress.  Cardiovascular: Normal rate, regular rhythm, normal heart sounds and intact distal pulses.   Pulmonary/Chest: Effort normal and breath sounds normal. No respiratory distress. He has no wheezes. He has no rales.  Abdominal: Soft. Bowel sounds are normal. He exhibits no distension and no mass. There is no tenderness. There is no rebound and no guarding.          Assessment & Plan:  Viral enteritis. This seems to be resolving on its own. I suggested he drink some Gatorade along with the water. Advance the diet as tolerated.  Alysia Penna, MD

## 2016-11-13 DIAGNOSIS — M9902 Segmental and somatic dysfunction of thoracic region: Secondary | ICD-10-CM | POA: Diagnosis not present

## 2016-11-13 DIAGNOSIS — M169 Osteoarthritis of hip, unspecified: Secondary | ICD-10-CM | POA: Diagnosis not present

## 2016-11-13 DIAGNOSIS — M9903 Segmental and somatic dysfunction of lumbar region: Secondary | ICD-10-CM | POA: Diagnosis not present

## 2016-11-13 DIAGNOSIS — M5387 Other specified dorsopathies, lumbosacral region: Secondary | ICD-10-CM | POA: Diagnosis not present

## 2016-11-13 DIAGNOSIS — M9905 Segmental and somatic dysfunction of pelvic region: Secondary | ICD-10-CM | POA: Diagnosis not present

## 2016-11-13 DIAGNOSIS — M50322 Other cervical disc degeneration at C5-C6 level: Secondary | ICD-10-CM | POA: Diagnosis not present

## 2016-11-13 DIAGNOSIS — M5386 Other specified dorsopathies, lumbar region: Secondary | ICD-10-CM | POA: Diagnosis not present

## 2016-11-13 DIAGNOSIS — M5137 Other intervertebral disc degeneration, lumbosacral region: Secondary | ICD-10-CM | POA: Diagnosis not present

## 2016-11-13 DIAGNOSIS — M9904 Segmental and somatic dysfunction of sacral region: Secondary | ICD-10-CM | POA: Diagnosis not present

## 2016-11-13 DIAGNOSIS — M9901 Segmental and somatic dysfunction of cervical region: Secondary | ICD-10-CM | POA: Diagnosis not present

## 2016-11-13 DIAGNOSIS — Q72811 Congenital shortening of right lower limb: Secondary | ICD-10-CM | POA: Diagnosis not present

## 2016-11-13 DIAGNOSIS — M5383 Other specified dorsopathies, cervicothoracic region: Secondary | ICD-10-CM | POA: Diagnosis not present

## 2016-11-22 ENCOUNTER — Other Ambulatory Visit: Payer: Self-pay | Admitting: Family Medicine

## 2016-11-22 NOTE — Telephone Encounter (Signed)
Call in #30 with 5 rf 

## 2016-11-23 NOTE — Telephone Encounter (Signed)
Called to the pharmacy and left on machine. 

## 2016-11-26 DIAGNOSIS — M2420 Disorder of ligament, unspecified site: Secondary | ICD-10-CM | POA: Diagnosis not present

## 2016-11-26 DIAGNOSIS — H02535 Eyelid retraction left lower eyelid: Secondary | ICD-10-CM | POA: Diagnosis not present

## 2016-11-26 DIAGNOSIS — H02045 Spastic entropion of left lower eyelid: Secondary | ICD-10-CM | POA: Diagnosis not present

## 2016-11-26 DIAGNOSIS — H02055 Trichiasis without entropian left lower eyelid: Secondary | ICD-10-CM | POA: Diagnosis not present

## 2016-11-26 DIAGNOSIS — H02035 Senile entropion of left lower eyelid: Secondary | ICD-10-CM | POA: Diagnosis not present

## 2016-12-28 ENCOUNTER — Encounter: Payer: Self-pay | Admitting: Family Medicine

## 2016-12-28 ENCOUNTER — Ambulatory Visit (INDEPENDENT_AMBULATORY_CARE_PROVIDER_SITE_OTHER): Payer: Medicare Other | Admitting: Family Medicine

## 2016-12-28 VITALS — BP 120/68 | Temp 97.7°F | Ht 69.0 in | Wt 155.0 lb

## 2016-12-28 DIAGNOSIS — L723 Sebaceous cyst: Secondary | ICD-10-CM

## 2016-12-28 DIAGNOSIS — G2581 Restless legs syndrome: Secondary | ICD-10-CM

## 2016-12-28 DIAGNOSIS — Z9109 Other allergy status, other than to drugs and biological substances: Secondary | ICD-10-CM | POA: Diagnosis not present

## 2016-12-28 MED ORDER — PRAMIPEXOLE DIHYDROCHLORIDE 0.125 MG PO TABS: 0.1250 mg | ORAL_TABLET | Freq: Every day | ORAL | 3 refills | Status: DC

## 2016-12-28 MED ORDER — METHYLPREDNISOLONE ACETATE 80 MG/ML IJ SUSP
120.0000 mg | Freq: Once | INTRAMUSCULAR | Status: AC
  Administered 2016-12-28: 120 mg via INTRAMUSCULAR

## 2016-12-28 NOTE — Patient Instructions (Signed)
WE NOW OFFER   [facility] FAST TRACK!!!  SAME DAY Appointments for ACUTE CARE  Such as: Sprains, Injuries, cuts, abrasions, rashes, muscle pain, joint pain, back pain Colds, flu, sore throats, headache, allergies, cough, fever  Ear pain, sinus and eye infections Abdominal pain, nausea, vomiting, diarrhea, upset stomach Animal/insect bites  3 Easy Ways to Schedule: Walk-In Scheduling Call in scheduling Mychart Sign-up: [email or web address]         

## 2016-12-28 NOTE — Progress Notes (Signed)
   Subjective:    Patient ID: Allen Wheeler, male    DOB: Jan 04, 1920, 83 y.o.   MRN: [id number]  HPI Here for 3 issues. First is allergies are flaring up again and he asks for a steroid shot. Second he has a painful lump on the back of the neck that stated as a pimple 2 weeks ago. No fever. Third he has had trouble falling asleep for the past 3 months due to some involuntary jerking of both legs. This only bothers him when he stretches out in bed, never during the day.    Review of Systems  Constitutional: Negative.   Respiratory: Negative.   Cardiovascular: Negative.   Skin: Positive for wound.       Objective:   Physical Exam  Constitutional: He is oriented to person, place, and time. He appears well-developed and well-nourished.  Cardiovascular: Normal rate, regular rhythm, normal heart sounds and intact distal pulses.   Pulmonary/Chest: Effort normal and breath sounds normal. No respiratory distress. He has no wheezes. He has no rales.  Neurological: He is alert and oriented to person, place, and time. He exhibits normal muscle tone. Coordination normal.  Skin:  There is a sebaceous cyst on the posterior neck           Assessment & Plan:  For the restless legs, he will try Mirapex 0.125 mg qhs. We lnaced the cyst on the neck with a scalpel and removed a moderate amount of sebaceous material. He will follow up prn. For the allergies he is given a shot of DepoMedrol.  Alysia Penna, MD

## 2016-12-28 NOTE — Addendum Note (Signed)
Addended by: Aggie Hacker A on: 12/28/2016 02:01 PM   Modules accepted: Orders

## 2017-01-08 ENCOUNTER — Encounter: Payer: Self-pay | Admitting: Family Medicine

## 2017-01-08 ENCOUNTER — Ambulatory Visit (INDEPENDENT_AMBULATORY_CARE_PROVIDER_SITE_OTHER): Payer: Medicare Other | Admitting: Family Medicine

## 2017-01-08 VITALS — BP 120/70 | Temp 97.6°F | Ht 69.0 in | Wt 153.0 lb

## 2017-01-08 DIAGNOSIS — L723 Sebaceous cyst: Secondary | ICD-10-CM

## 2017-01-08 DIAGNOSIS — Z23 Encounter for immunization: Secondary | ICD-10-CM | POA: Diagnosis not present

## 2017-01-08 NOTE — Addendum Note (Signed)
Addended by: Aggie Hacker A on: 01/08/2017 11:40 AM   Modules accepted: Orders

## 2017-01-08 NOTE — Patient Instructions (Signed)
WE NOW OFFER   [facility] FAST TRACK!!!  SAME DAY Appointments for ACUTE CARE  Such as: Sprains, Injuries, cuts, abrasions, rashes, muscle pain, joint pain, back pain Colds, flu, sore throats, headache, allergies, cough, fever  Ear pain, sinus and eye infections Abdominal pain, nausea, vomiting, diarrhea, upset stomach Animal/insect bites  3 Easy Ways to Schedule: Walk-In Scheduling Call in scheduling Mychart Sign-up: [email or web address]         

## 2017-01-08 NOTE — Progress Notes (Signed)
   Subjective:    Patient ID: Allen Wheeler, male    DOB: Aug 21, 1919, 83 y.o.   MRN: [id number]  HPI Here to follow up on a cyst on the back of the neck. On 12-28-16 we incised this and drained a large amount of material from it. Since then it feels better and does not bother him at all. He asks me to check it.    Review of Systems  Constitutional: Negative.   Respiratory: Negative.   Cardiovascular: Negative.   Skin: Positive for wound.       Objective:   Physical Exam  Constitutional: He appears well-developed and well-nourished.  Cardiovascular: Normal rate, regular rhythm, normal heart sounds and intact distal pulses.   Pulmonary/Chest: Effort normal and breath sounds normal. No respiratory distress. He has no wheezes. He has no rales.  Skin:  There is a small residual firm cystic lesion on the posterior neck. This is not tender.          Assessment & Plan:  The sebaceous cyst has mostly resolved, but a small remnant remains. I reassured him this is normal and no further treatment is needed. He is given vaccines for pneumonia and flu.  Alysia Penna, MD

## 2017-01-12 ENCOUNTER — Other Ambulatory Visit: Payer: Self-pay | Admitting: Family Medicine

## 2017-01-22 ENCOUNTER — Other Ambulatory Visit: Payer: Self-pay | Admitting: Family Medicine

## 2017-01-22 DIAGNOSIS — E039 Hypothyroidism, unspecified: Secondary | ICD-10-CM

## 2017-01-23 NOTE — Telephone Encounter (Signed)
Should pt have any labs before refilling?

## 2017-02-26 ENCOUNTER — Ambulatory Visit (INDEPENDENT_AMBULATORY_CARE_PROVIDER_SITE_OTHER): Payer: Medicare Other | Admitting: Family Medicine

## 2017-02-26 ENCOUNTER — Encounter: Payer: Self-pay | Admitting: Family Medicine

## 2017-02-26 VITALS — BP 110/60 | HR 61 | Temp 97.7°F | Wt 157.0 lb

## 2017-02-26 DIAGNOSIS — L299 Pruritus, unspecified: Secondary | ICD-10-CM | POA: Diagnosis not present

## 2017-02-26 MED ORDER — METHYLPREDNISOLONE ACETATE 40 MG/ML IJ SUSP
120.0000 mg | Freq: Once | INTRAMUSCULAR | Status: AC
  Administered 2017-02-26: 120 mg via INTRAMUSCULAR

## 2017-02-26 MED ORDER — METHYLPREDNISOLONE ACETATE 80 MG/ML IJ SUSP: 120.0000 mg | Freq: Once | INTRAMUSCULAR | Status: DC

## 2017-02-26 NOTE — Addendum Note (Signed)
Addended by: Dorrene German on: 02/26/2017 03:52 PM   Modules accepted: Orders

## 2017-02-26 NOTE — Addendum Note (Signed)
Addended by: Myriam Forehand on: 02/26/2017 03:46 PM   Modules accepted: Orders

## 2017-02-26 NOTE — Progress Notes (Signed)
   Subjective:    Patient ID: SILVESTRE MINES, male    DOB: 1919/08/19, 83 y.o.   MRN: [id number]  HPI Here for another bout of itching all over. He has these bouts several times a year. No trouble breathing, no visible rash.    Review of Systems  Constitutional: Negative.   Respiratory: Negative.   Cardiovascular: Negative.   Neurological: Negative.        Objective:   Physical Exam  Constitutional: He is oriented to person, place, and time. He appears well-developed and well-nourished.  Cardiovascular: Normal rate, regular rhythm, normal heart sounds and intact distal pulses.  Pulmonary/Chest: Effort normal and breath sounds normal.  Musculoskeletal: He exhibits no edema.  Neurological: He is alert and oriented to person, place, and time.  Skin: No rash noted.          Assessment & Plan:  Pruritis, given a steroid shot. Recheck prn.  Alysia Penna, MD

## 2017-03-14 ENCOUNTER — Ambulatory Visit: Payer: Medicare Other | Admitting: Internal Medicine

## 2017-03-15 ENCOUNTER — Encounter: Payer: Self-pay | Admitting: Family Medicine

## 2017-03-15 ENCOUNTER — Ambulatory Visit (INDEPENDENT_AMBULATORY_CARE_PROVIDER_SITE_OTHER): Payer: Medicare Other | Admitting: Family Medicine

## 2017-03-15 VITALS — BP 100/60 | HR 62 | Temp 97.3°F | Wt 158.2 lb

## 2017-03-15 DIAGNOSIS — L299 Pruritus, unspecified: Secondary | ICD-10-CM

## 2017-03-15 MED ORDER — PREDNISONE 10 MG PO TABS: 10.0000 mg | ORAL_TABLET | Freq: Every day | ORAL | 1 refills | Status: DC

## 2017-03-15 MED ORDER — METHYLPREDNISOLONE ACETATE 40 MG/ML IJ SUSP
120.0000 mg | Freq: Once | INTRAMUSCULAR | Status: AC
  Administered 2017-03-15: 120 mg via INTRAMUSCULAR

## 2017-03-15 NOTE — Progress Notes (Signed)
   Subjective:    Patient ID: MARQUELL SAENZ, male    DOB: 1919-04-17, 83 y.o.   MRN: [id number]  HPI Here for more trouble with itching all over. No visible rashes. He has dealt with this for 2 years now. Whenever he gets a steroid shot it helps but it wears off in a few days. He did take daily low dose prednisone at one time.    Review of Systems  Constitutional: Negative.   Respiratory: Negative.   Cardiovascular: Negative.   Neurological: Negative.        Objective:   Physical Exam  Constitutional: He is oriented to person, place, and time. He appears well-developed and well-nourished.  Cardiovascular: Normal rate, regular rhythm, normal heart sounds and intact distal pulses.  Pulmonary/Chest: Effort normal and breath sounds normal. No respiratory distress. He has no wheezes. He has no rales.  Neurological: He is alert and oriented to person, place, and time.  Skin: No rash noted.          Assessment & Plan:  Itching, he is given a steroid shot today. Then he will begin taking prednisone 10 mg daily. Recheck prn. Alysia Penna, MD

## 2017-03-25 ENCOUNTER — Other Ambulatory Visit: Payer: Self-pay | Admitting: Family Medicine

## 2017-03-25 NOTE — Telephone Encounter (Signed)
Needs an OV for more refills

## 2017-04-01 ENCOUNTER — Emergency Department (HOSPITAL_COMMUNITY)
Admission: EM | Admit: 2017-04-01 | Discharge: 2017-04-01 | Disposition: A | Discharge status: Home / Self Care | Payer: Medicare Other | Attending: Emergency Medicine | Admitting: Emergency Medicine

## 2017-04-01 ENCOUNTER — Ambulatory Visit: Payer: Self-pay | Admitting: *Deleted

## 2017-04-01 ENCOUNTER — Encounter (HOSPITAL_COMMUNITY): Payer: Self-pay | Admitting: Emergency Medicine

## 2017-04-01 DIAGNOSIS — W2209XA Striking against other stationary object, initial encounter: Secondary | ICD-10-CM | POA: Diagnosis not present

## 2017-04-01 DIAGNOSIS — Y9301 Activity, walking, marching and hiking: Secondary | ICD-10-CM | POA: Insufficient documentation

## 2017-04-01 DIAGNOSIS — Y92009 Unspecified place in unspecified non-institutional (private) residence as the place of occurrence of the external cause: Secondary | ICD-10-CM | POA: Diagnosis not present

## 2017-04-01 DIAGNOSIS — Y999 Unspecified external cause status: Secondary | ICD-10-CM | POA: Insufficient documentation

## 2017-04-01 DIAGNOSIS — S81812A Laceration without foreign body, left lower leg, initial encounter: Secondary | ICD-10-CM | POA: Insufficient documentation

## 2017-04-01 DIAGNOSIS — S01112A Laceration without foreign body of left eyelid and periocular area, initial encounter: Secondary | ICD-10-CM | POA: Diagnosis not present

## 2017-04-01 MED ORDER — TETANUS-DIPHTH-ACELL PERTUSSIS 5-2.5-18.5 LF-MCG/0.5 IM SUSP
0.5000 mL | Freq: Once | INTRAMUSCULAR | Status: DC
  Filled 2017-04-01: qty 0.5

## 2017-04-01 MED ORDER — LIDOCAINE-EPINEPHRINE (PF) 2 %-1:200000 IJ SOLN
10.0000 mL | Freq: Once | INTRAMUSCULAR | Status: AC
  Administered 2017-04-01: 10 mL via INTRADERMAL
  Filled 2017-04-01: qty 20

## 2017-04-01 MED ORDER — CEPHALEXIN 500 MG PO CAPS: 500.0000 mg | ORAL_CAPSULE | Freq: Three times a day (TID) | ORAL | 0 refills | Status: AC

## 2017-04-01 NOTE — Discharge Instructions (Signed)
For your wound, leave the current dressing in place for the next 24 hours.  He can then remove it, wash the wound gently with water and soap, then apply the nonstick dressing followed by a light pressure dressing.  Follow-up with your doctor in 1 week for repeat wound care.

## 2017-04-01 NOTE — ED Triage Notes (Signed)
Patient here from home with complaints of left lower leg laceration after hitting leg. Bleeding. Reports taking an aspirin a day.

## 2017-04-01 NOTE — Telephone Encounter (Signed)
Called in c/o not being able to stop the bleeding from a wound on his left shin.   He ran into a chair last night in his house.  He has wrapped it and applied pressure but it just keeps trickling blood.    He is going to go to [facility] ED because the urgent cares are closed due to the Christmas holiday.  Dr. Gabriel Carina is closed also.  Reason for Disposition . [1] Bleeding AND [2] won't stop after 10 minutes of direct pressure (using correct technique)  Answer Assessment - Initial Assessment Questions 1. APPEARANCE of INJURY: "What does the injury look like?"      It's a cut.   I ran into a chair last night 2. SIZE: "How large is the cut?"      1 inch long 3. BLEEDING: "Is it bleeding now?" If so, ask: "Is it difficult to stop?"      Yes.   Trickle .   I put a band on it as tight as I could.   I messed all the carpets in my bathroom with blood. 4. LOCATION: "Where is the injury located?"      Left shinn 5. ONSET: "How long ago did the injury occur?"      Last night 6. MECHANISM: "Tell me how it happened."      I ran into a chair. 7. TETANUS: "When was the last tetanus booster?"     I don't know 8. PREGNANCY: "Is there any chance you are pregnant?" "When was your last menstrual period?"     N/A  Protocols used: [place]

## 2017-04-01 NOTE — ED Provider Notes (Signed)
[place] DEPT Provider Note   CSN: [id number] Arrival date & time: 04/01/17  1550     History   Chief Complaint Chief Complaint  Patient presents with  . Laceration    HPI Allen Wheeler is a 83 y.o. male.  HPI   83 year old male with past medical history as below here with skin tear to left leg.  The patient was walking in his house yesterday when he abraded his leg on a metal chair.  He reports that he had immediate bleeding at that time.  Over the last 12 hours, he has had intermittent bleeding from it so he thought he present.  He washed it with fluid and peroxide initially, then wrapped it with a sterile dressing.  He has had intermittent bleeding since then.  He has mild sharp, stabbing pain associated with it.  No swelling.  No numbness or weakness.  No deformity.  Denies any drainage or redness.  Past Medical History:  Diagnosis Date  . Allergy   . Anxiety   . Chickenpox   . Depression   . Dizziness, nonspecific    loss of balance last few months  . GERD (gastroesophageal reflux disease)   . Hearing loss    wears hearing aids  . Nephrolithiasis   . Normal cardiac stress test 09-20-12  . Osteoarthritis     Patient Active Problem List   Diagnosis Date Noted  . Multiple environmental allergies 12/28/2016  . Restless legs syndrome 12/28/2016  . Inguinal hernia 06/21/2016  . Right inguinal hernia 04/19/2016  . Left hand weakness   . Sepsis ([place]) 02/15/2016  . Cholelithiasis with acute cholangitis with biliary obstruction 02/14/2016  . Hypothyroidism 12/13/2015  . Eczema 09/16/2014  . Abnormal ECG 09/03/2012  . SOMNOLENCE 10/31/2009  . OTITIS EXTERNA 07/04/2009  . BPH with urinary obstruction 02/16/2009  . Pruritic disorder 02/16/2009  . WEAKNESS 02/16/2009  . ELEVATED BLOOD PRESSURE 02/16/2009  . Anxiety 05/27/2008  . Depression 05/27/2008  . GERD 05/27/2008  . Osteoarthritis 05/27/2008  . NEPHROLITHIASIS, HX OF  05/27/2008    Past Surgical History:  Procedure Laterality Date  . CATARACT EXTRACTION  2017  . CHOLECYSTECTOMY N/A 02/18/2016   Procedure: LAPAROSCOPIC CHOLECYSTECTOMY WITH POSSIBLE INTRAOPERATIVE CHOLANGIOGRAM;  Surgeon: Mickeal Skinner, MD;  Location: [place];  Service: General;  Laterality: N/A;  . ERCP N/A 02/16/2016   Procedure: ENDOSCOPIC RETROGRADE CHOLANGIOPANCREATOGRAPHY (ERCP);  Surgeon: Teena Irani, MD;  Location: [facility] ENDOSCOPY;  Service: Endoscopy;  Laterality: N/A;  . HERNIA REPAIR     wears truss  . INGUINAL HERNIA REPAIR Right 06/21/2016   Procedure: OPEN RIGHT INGUINAL HERNIA REPAIR;  Surgeon: [name] Kinsinger, MD;  Location: [facility];  Service: General;  Laterality: Right;  . INSERTION OF MESH Right 06/21/2016   Procedure: INSERTION OF MESH;  Surgeon: [name] Kinsinger, MD;  Location: [facility];  Service: General;  Laterality: Right;  . MASTOIDECTOMY         Home Medications    Prior to Admission medications   Medication Sig Start Date End Date Taking? Authorizing Provider  aspirin 81 MG tablet Take 81 mg by mouth every other day.    Yes [provider]  citalopram (CELEXA) 10 MG tablet TAKE 1 TABLET ONCE DAILY. 07/25/16  Yes [name], MD  docusate sodium (COLACE) 100 MG capsule Take 1 capsule (100 mg total) by mouth every 12 (twelve) hours. 07/16/16  Yes Law, Bea Graff, PA-C  gabapentin (NEURONTIN) 100 MG capsule  TAKE 1 CAPSULE AT BEDTIME. 03/25/17  Yes [name], MD  levothyroxine (SYNTHROID, LEVOTHROID) 50 MCG tablet TAKE 1 TABLET ONCE DAILY. 01/23/17  Yes [name], MD  LORazepam (ATIVAN) 1 MG tablet TAKE 1 TABLET AT BEDTIME AS NEEDED. 11/23/16  Yes [name], MD  magnesium oxide (MAG-OX) 400 MG tablet Take 400 mg by mouth daily.   Yes [provider]  Multiple Vitamin (MULTIVITAMIN WITH MINERALS) TABS tablet Take 1 tablet by mouth daily. Centrum Silver   Yes [provider]  pramipexole (MIRAPEX) 0.125 MG tablet  Take 1 tablet (0.125 mg total) by mouth at bedtime. 12/28/16  Yes [name], MD  predniSONE (DELTASONE) 10 MG tablet Take 1 tablet (10 mg total) by mouth daily. 03/15/17  Yes [name], MD  senna-docusate (SENOKOT-S) 8.6-50 MG tablet Take 1 tablet by mouth 2 (two) times daily as needed for mild constipation. 02/19/16  Yes Rai, Ripudeep K, MD  tamsulosin (FLOMAX) 0.4 MG CAPS capsule Take 1 capsule (0.4 mg total) by mouth daily. 07/03/16  Yes [name], MD  cephALEXin (KEFLEX) 500 MG capsule Take 1 capsule (500 mg total) by mouth 3 (three) times daily for 5 days. 04/01/17 04/06/17  Duffy Bruce, MD  HYDROcodone-acetaminophen (NORCO/VICODIN) 5-325 MG tablet Take 1-2 tablets by mouth every 6 (six) hours as needed for moderate pain. Patient not taking: Reported on 04/01/2017 06/22/16   Kinsinger, [name], MD  omeprazole (PRILOSEC) 40 MG capsule TAKE 1 CAPSULE DAILY. Patient not taking: Reported on 04/01/2017 01/14/17   [name], MD    Family History Family History  Problem Relation Age of Onset  . Heart disease Mother        Pacemaker  . Colon cancer Father     Social History Social History   Tobacco Use  . Smoking status: Former Research scientist (life sciences)  . Smokeless tobacco: Never Used  Substance Use Topics  . Alcohol use: Yes    Alcohol/week: 10.8 oz    Types: 14 Shots of liquor, 4 Glasses of wine per week  . Drug use: No     Allergies   Patient has no known allergies.   Review of Systems Review of Systems  Constitutional: Negative for chills and fever.  HENT: Negative for congestion and rhinorrhea.   Eyes: Negative for visual disturbance.  Respiratory: Negative for cough, shortness of breath and wheezing.   Cardiovascular: Negative for chest pain and leg swelling.  Gastrointestinal: Negative for abdominal pain, diarrhea, nausea and vomiting.  Genitourinary: Negative for dysuria and flank pain.  Musculoskeletal: Negative for neck pain and neck stiffness.  Skin:  Positive for wound. Negative for rash.  Allergic/Immunologic: Negative for immunocompromised state.  Neurological: Negative for syncope, weakness and headaches.  All other systems reviewed and are negative.    Physical Exam Updated Vital Signs BP (!) 153/69   Pulse 62   Temp (!) 97.5 F (36.4 C) (Oral)   Resp 16   Ht 5\' 6"  (1.676 m)   Wt 70.3 kg (155 lb)   SpO2 99%   BMI 25.02 kg/m   Physical Exam  Constitutional: He is oriented to person, place, and time. He appears well-developed and well-nourished. No distress.  HENT:  Head: Normocephalic and atraumatic.  Eyes: Conjunctivae are normal.  Neck: Neck supple.  Cardiovascular: Normal rate, regular rhythm and normal heart sounds.  Pulmonary/Chest: Effort normal. No respiratory distress. He has no wheezes.  Abdominal: He exhibits no distension.  Musculoskeletal: He exhibits no edema.  Neurological: He is alert and oriented to person, place, and time. He exhibits normal muscle tone.  Skin: Skin is warm. Capillary refill takes less than 2 seconds. No rash noted.  Nursing note and vitals reviewed.   LOWER EXTREMITY EXAM: Left  INSPECTION & PALPATION: Superficial skin tear to anterior left lower shin.  No exposed bone or deep tissue.  No active bleeding.  No surrounding erythema or discharge.  No purulence.  No fluctuance.  SENSORY: sensation is intact to light touch in:  Superficial peroneal nerve distribution (over dorsum of foot) Deep peroneal nerve distribution (over first dorsal web space) Sural nerve distribution (over lateral aspect 5th metatarsal) Saphenous nerve distribution (over medial instep)  MOTOR:  + Motor EHL (great toe dorsiflexion) + FHL (great toe plantar flexion)  + TA (ankle dorsiflexion)  + GSC (ankle plantar flexion)  VASCULAR: 2+ dorsalis pedis and posterior tibialis pulses Capillary refill < 2 sec, toes warm and well-perfused  COMPARTMENTS: Soft, warm, well-perfused No pain with passive  extension No parethesias     ED Treatments / Results  Labs (all labs ordered are listed, but only abnormal results are displayed) Labs Reviewed - No data to display  EKG  EKG Interpretation None       Radiology No results found.  Procedures .Marland KitchenLaceration Repair Date/Time: 04/01/2017 5:55 PM Performed by: Duffy Bruce, MD Authorized by: Duffy Bruce, MD   Consent:    Consent obtained:  Verbal   Consent given by:  Patient   Risks discussed:  Infection, need for additional repair, pain, tendon damage, retained foreign body, vascular damage, poor cosmetic result, poor wound healing and nerve damage   Alternatives discussed:  Referral and delayed treatment Anesthesia (see MAR for exact dosages):    Anesthesia method:  None Laceration details:    Location: Left shin. Repair type:    Repair type:  Simple Pre-procedure details:    Preparation:  Patient was prepped and draped in usual sterile fashion and imaging obtained to evaluate for foreign bodies Exploration:    Hemostasis achieved with:  Direct pressure   Wound exploration: wound explored through full range of motion and entire depth of wound probed and visualized   Treatment:    Area cleansed with:  Betadine   Amount of cleaning:  Extensive   Irrigation solution:  Sterile water   Irrigation method:  Pressure wash Skin repair:    Repair method:  Steri-Strips   Number of Steri-Strips:  6 Approximation:    Approximation:  Close   Vermilion border: well-aligned   Post-procedure details:    Dressing:  Antibiotic ointment   Patient tolerance of procedure:  Tolerated well, no immediate complications   (including critical care time)  Medications Ordered in ED Medications  Tdap (BOOSTRIX) injection 0.5 mL (not administered)  lidocaine-EPINEPHrine (XYLOCAINE W/EPI) 2 %-1:200000 (PF) injection 10 mL (10 mLs Intradermal Given by Other 04/01/17 1725)     Initial Impression / Assessment and Plan / ED Course  I  have reviewed the triage vital signs and the nursing notes.  Pertinent labs & imaging results that were available during my care of the patient were reviewed by me and considered in my medical decision making (see chart for details).     83 year old male here with left superficial skin tear.  He is now 24 hours after injury.  There are no open or gaping wounds on my exam.  Given that there is no active bleeding and that the wound is greater than 24 hours, will  close loosely with Steri-Strips.  I then applied a pressure dressing.  The wound is hemostatic.  Tetanus is updated.  Given that is located on his leg, will place him on prophylactic Keflex and discharged with close outpatient follow-up.  This note was prepared with assistance of Systems analyst. Occasional wrong-word or sound-a-like substitutions may have occurred due to the inherent limitations of voice recognition software.   Final Clinical Impressions(s) / ED Diagnoses   Final diagnoses:  Laceration of left lower extremity, initial encounter    ED Discharge Orders        Ordered    cephALEXin (KEFLEX) 500 MG capsule  3 times daily     04/01/17 1740       Duffy Bruce, MD 04/01/17 1759

## 2017-04-10 ENCOUNTER — Ambulatory Visit (INDEPENDENT_AMBULATORY_CARE_PROVIDER_SITE_OTHER): Payer: Medicare Other | Admitting: Family Medicine

## 2017-04-10 VITALS — BP 130/84 | HR 52 | Temp 97.5°F | Wt 156.9 lb

## 2017-04-10 DIAGNOSIS — S81802D Unspecified open wound, left lower leg, subsequent encounter: Secondary | ICD-10-CM

## 2017-04-10 NOTE — Progress Notes (Signed)
Subjective:     Patient ID: Allen Wheeler, male   DOB: 18-Apr-1919, 84 y.o.   MRN: [id number]  HPI Patient seen for ER follow-up in absence of his primary provider who is out today. He apparently had injury on 03/31/17. He was walking through his house and hit his left leg against a metal chair. He noticed a skin tear. He went the next day (over 24 hours later) to the emergency room. He initially washed this with peroxide. He apparently declined tetanus vaccine in the ER. Closed with Steri-Strips. He was placed prophylactically on Keflex 500 mg 3 times a day for 5 days. He is not seeing signs of secondary infection. He apparently misunderstood directions and replaced the Steri-Strips himself a few days after ER visit.  He has not noticed any signs of infection such as fever, chills, erythema, puslike drainage. No history of diabetes or reported peripheral vascular disease.  Past Medical History:  Diagnosis Date  . Allergy   . Anxiety   . Chickenpox   . Depression   . Dizziness, nonspecific    loss of balance last few months  . GERD (gastroesophageal reflux disease)   . Hearing loss    wears hearing aids  . Nephrolithiasis   . Normal cardiac stress test 09-20-12  . Osteoarthritis    Past Surgical History:  Procedure Laterality Date  . CATARACT EXTRACTION  2017  . CHOLECYSTECTOMY N/A 02/18/2016   Procedure: LAPAROSCOPIC CHOLECYSTECTOMY WITH POSSIBLE INTRAOPERATIVE CHOLANGIOGRAM;  Surgeon: Mickeal Skinner, MD;  Location: [place];  Service: General;  Laterality: N/A;  . ERCP N/A 02/16/2016   Procedure: ENDOSCOPIC RETROGRADE CHOLANGIOPANCREATOGRAPHY (ERCP);  Surgeon: Teena Irani, MD;  Location: [facility] ENDOSCOPY;  Service: Endoscopy;  Laterality: N/A;  . HERNIA REPAIR     wears truss  . INGUINAL HERNIA REPAIR Right 06/21/2016   Procedure: OPEN RIGHT INGUINAL HERNIA REPAIR;  Surgeon: Arta Yorel Redder Kinsinger, MD;  Location: [facility];  Service: General;  Laterality: Right;  . INSERTION OF MESH Right  06/21/2016   Procedure: INSERTION OF MESH;  Surgeon: Arta Lakendrick Paradis Kinsinger, MD;  Location: [facility];  Service: General;  Laterality: Right;  . MASTOIDECTOMY      reports that he has quit smoking. he has never used smokeless tobacco. He reports that he drinks about 10.8 oz of alcohol per week. He reports that he does not use drugs. family history includes Colon cancer in his father; Heart disease in his mother. No Known Allergies   Review of Systems  Constitutional: Negative for chills and fever.  Cardiovascular: Negative for leg swelling.  Endocrine: Negative for polydipsia and polyuria.  Skin: Positive for wound.  Neurological: Negative for syncope.  Hematological: Negative for adenopathy.       Objective:   Physical Exam  Constitutional: He appears well-developed and well-nourished.  Cardiovascular: Normal rate and regular rhythm.  Pulmonary/Chest: Effort normal and breath sounds normal. No respiratory distress. He has no wheezes. He has no rales.  Skin:  Patient has wound left mid leg. Steri-Strips are in place. There is no significant surrounding erythema. No purulent drainage. No warmth. Minimally tender  Feet are warm to touch with good capillary refill       Assessment:     Left leg wound-slowly healing without signs of secondary infection    Plan:     -Wound is re-cleaned and dressing reapplied -Leave Steri-Strips in place until they fall off -Patient again declines tetanus -Follow-up with primary to reassess in 5 days and sooner  for any signs of secondary infection (which were reviewed with patient today)  Eulas Post MD [facility]

## 2017-04-10 NOTE — Patient Instructions (Addendum)
Keep clean with soap and water. Let steri-strips fall off on their own Follow up for any signs of infection such as swelling, redness, warmth, pus. Keep your appointment for follow up with Dr Sarajane Jews.

## 2017-04-15 ENCOUNTER — Ambulatory Visit: Payer: Medicare Other | Admitting: Family Medicine

## 2017-04-17 ENCOUNTER — Encounter: Payer: Self-pay | Admitting: Family Medicine

## 2017-04-17 ENCOUNTER — Ambulatory Visit (INDEPENDENT_AMBULATORY_CARE_PROVIDER_SITE_OTHER): Payer: Medicare Other | Admitting: Family Medicine

## 2017-04-17 VITALS — BP 136/70 | HR 69 | Temp 97.8°F | Wt 160.6 lb

## 2017-04-17 DIAGNOSIS — S81812D Laceration without foreign body, left lower leg, subsequent encounter: Secondary | ICD-10-CM

## 2017-04-17 DIAGNOSIS — M21611 Bunion of right foot: Secondary | ICD-10-CM | POA: Diagnosis not present

## 2017-04-17 NOTE — Progress Notes (Signed)
   Subjective:    Patient ID: Allen Wheeler, male    DOB: [birth year], 84 y.o.   MRN: [id number]  HPI Here to recheck a skin wound on the left lower leg. At home on 03-31-17 he bumped against a metal chair and sustained the injury. He went to the ER on 04-01-17 and the wound was cleaned out and Steri-Strips were applied. Given 5 days of Keflex. He was seen here on 04-10-17 and the dressing was changed. Since then he has been feeling better. The area is still tender but much less so.    Review of Systems  Constitutional: Negative.   Respiratory: Negative.   Cardiovascular: Negative.   Skin: Positive for wound.       Objective:   Physical Exam  Constitutional: He appears well-developed and well-nourished.  Cardiovascular: Normal rate, regular rhythm, normal heart sounds and intact distal pulses.  Pulmonary/Chest: Effort normal and breath sounds normal. No respiratory distress. He has no wheezes. He has no rales.  Skin:  The anterior lower left leg has a superficial laceration about 4 cm long. This is covered by 6 Steri-Strips in place. There is a small amount of purulent drainage present with some slight odor. The wound edges have come together nicely           Assessment & Plan:  Leg laceration. All Steri-Strips were removed today. The wound is cleaned with water. Dressed with Neosporin, Telfa, and gauze. He will gently clean this in the shower daily with soap and water, then redress it daily. Recheck prn.  Alysia Penna, MD

## 2017-04-20 ENCOUNTER — Other Ambulatory Visit: Payer: Self-pay | Admitting: Family Medicine

## 2017-04-22 NOTE — Telephone Encounter (Signed)
Last OV 04/17/2017.  Rx was last refilled 03/25/2017 disp 30 with no refills. RX sent

## 2017-04-29 ENCOUNTER — Encounter: Payer: Medicare Other | Admitting: Podiatry

## 2017-05-06 ENCOUNTER — Ambulatory Visit (INDEPENDENT_AMBULATORY_CARE_PROVIDER_SITE_OTHER): Payer: Medicare Other | Admitting: Podiatry

## 2017-05-06 ENCOUNTER — Encounter: Payer: Self-pay | Admitting: Podiatry

## 2017-05-06 VITALS — BP 135/75 | HR 62 | Resp 16

## 2017-05-06 DIAGNOSIS — M21619 Bunion of unspecified foot: Secondary | ICD-10-CM | POA: Diagnosis not present

## 2017-05-06 DIAGNOSIS — L84 Corns and callosities: Secondary | ICD-10-CM | POA: Diagnosis not present

## 2017-05-06 NOTE — Progress Notes (Signed)
   Subjective:    Patient ID: Allen Wheeler, male    DOB: 1919-09-05, 84 y.o.   MRN: [id number]  HPI    Review of Systems  All other systems reviewed and are negative.      Objective:   Physical Exam        Assessment & Plan:

## 2017-05-06 NOTE — Patient Instructions (Signed)

## 2017-05-08 NOTE — Progress Notes (Signed)
Subjective:   Patient ID: Allen Wheeler, male   DOB: 84 y.o.   MRN: [id number]   HPI Patient presents with large structural bunion deformity of the right foot with plantar keratotic lesion is becoming painful and at times hard to wear shoe gear but does walk and ambulate without significant pain   Review of Systems  All other systems reviewed and are negative.       Objective:  Physical Exam  Constitutional: He appears well-developed and well-nourished.  Cardiovascular: Intact distal pulses.  Pulmonary/Chest: Effort normal.  Musculoskeletal: Normal range of motion.  Neurological: He is alert.  Skin: Skin is warm.  Nursing note and vitals reviewed.   Neurovascular status was found to be moderately diminished but intact with diminished range of motion subtalar midtarsal joint and muscle strength.  Patient is found to have severe bunion deformity right with plantar keratotic lesion is painful when pressed and is nucleated with inability to walk comfortably on the foot.  It is more due to the corn formation than the actual bunion     Assessment:  Structural bunion with keratotic lesion formation     Plan:  Debridement of lesion accomplished with no iatrogenic bleeding advised on wider shoes and discussed bunion procedure but did not recommend surgical intervention at the current time

## 2017-05-22 ENCOUNTER — Other Ambulatory Visit: Payer: Self-pay | Admitting: Family Medicine

## 2017-05-22 NOTE — Telephone Encounter (Signed)
Sent to PCP for approval.  

## 2017-05-23 NOTE — Telephone Encounter (Signed)
Call in #90 with one rf for both meds

## 2017-06-13 DIAGNOSIS — Z961 Presence of intraocular lens: Secondary | ICD-10-CM | POA: Diagnosis not present

## 2017-07-06 ENCOUNTER — Other Ambulatory Visit: Payer: Self-pay | Admitting: Family Medicine

## 2017-07-09 NOTE — Telephone Encounter (Signed)
Last OV 05/06/2017   Last refilled 07/03/2016 disp 90 with 3 refills   Sent to PCP for approval

## 2017-07-15 ENCOUNTER — Other Ambulatory Visit: Payer: Self-pay | Admitting: Family Medicine

## 2017-07-19 ENCOUNTER — Other Ambulatory Visit: Payer: Self-pay | Admitting: Family Medicine

## 2017-08-21 ENCOUNTER — Other Ambulatory Visit: Payer: Self-pay | Admitting: Family Medicine

## 2017-08-21 NOTE — Telephone Encounter (Signed)
Call in Lorazepam #90 with one rf, also Gabapentin #270 with one rf

## 2017-08-21 NOTE — Telephone Encounter (Signed)
Last OV 04/17/17 ( Laceration of Left Lower Leg), No future OV  Last filled: Gabapentin 05/24/17, #90 with 0 refills  Lorazepam 05/24/17, # 90 with 0 refills  Please advise if okay to fill

## 2017-08-21 NOTE — Telephone Encounter (Signed)
[place] and spoke to Lamar and gave her medication instructions. Marzetta Board was only able to fill the Gabapentin 100 mg for #90 with 1 refill instead of the #270 with 1 refill.

## 2017-08-22 NOTE — Progress Notes (Signed)
This encounter was created in error - please disregard.

## 2017-09-09 ENCOUNTER — Other Ambulatory Visit: Payer: Self-pay | Admitting: Family Medicine

## 2017-09-09 NOTE — Telephone Encounter (Signed)
Last OV 04/17/2017   Last refilled 03/15/2017 disp 90 with 1 refill   Sent to PCP to advise

## 2017-10-09 DIAGNOSIS — H0012 Chalazion right lower eyelid: Secondary | ICD-10-CM | POA: Diagnosis not present

## 2017-10-09 DIAGNOSIS — H02135 Senile ectropion of left lower eyelid: Secondary | ICD-10-CM | POA: Diagnosis not present

## 2017-10-09 DIAGNOSIS — H02535 Eyelid retraction left lower eyelid: Secondary | ICD-10-CM | POA: Diagnosis not present

## 2017-10-09 DIAGNOSIS — D210 Benign neoplasm of connective and other soft tissue of head, face and neck: Secondary | ICD-10-CM | POA: Diagnosis not present

## 2017-10-09 DIAGNOSIS — H04222 Epiphora due to insufficient drainage, left lacrimal gland: Secondary | ICD-10-CM | POA: Diagnosis not present

## 2017-10-09 DIAGNOSIS — D485 Neoplasm of uncertain behavior of skin: Secondary | ICD-10-CM | POA: Diagnosis not present

## 2017-10-11 ENCOUNTER — Other Ambulatory Visit: Payer: Self-pay | Admitting: Family Medicine

## 2017-10-14 ENCOUNTER — Other Ambulatory Visit: Payer: Self-pay | Admitting: Family Medicine

## 2017-10-16 DIAGNOSIS — H04222 Epiphora due to insufficient drainage, left lacrimal gland: Secondary | ICD-10-CM | POA: Diagnosis not present

## 2017-10-16 DIAGNOSIS — H02035 Senile entropion of left lower eyelid: Secondary | ICD-10-CM | POA: Diagnosis not present

## 2017-10-16 DIAGNOSIS — D210 Benign neoplasm of connective and other soft tissue of head, face and neck: Secondary | ICD-10-CM | POA: Diagnosis not present

## 2017-10-16 DIAGNOSIS — H02535 Eyelid retraction left lower eyelid: Secondary | ICD-10-CM | POA: Diagnosis not present

## 2017-10-16 DIAGNOSIS — H02055 Trichiasis without entropian left lower eyelid: Secondary | ICD-10-CM | POA: Diagnosis not present

## 2018-01-17 ENCOUNTER — Other Ambulatory Visit: Payer: Self-pay | Admitting: Family Medicine

## 2018-01-17 DIAGNOSIS — E039 Hypothyroidism, unspecified: Secondary | ICD-10-CM

## 2018-01-25 ENCOUNTER — Other Ambulatory Visit: Payer: Self-pay | Admitting: Family Medicine

## 2018-02-10 ENCOUNTER — Ambulatory Visit (INDEPENDENT_AMBULATORY_CARE_PROVIDER_SITE_OTHER): Payer: Medicare Other | Admitting: Family Medicine

## 2018-02-10 ENCOUNTER — Encounter: Payer: Self-pay | Admitting: Family Medicine

## 2018-02-10 VITALS — BP 124/76 | HR 66 | Temp 98.4°F | Wt 162.5 lb

## 2018-02-10 DIAGNOSIS — R197 Diarrhea, unspecified: Secondary | ICD-10-CM | POA: Diagnosis not present

## 2018-02-10 DIAGNOSIS — L309 Dermatitis, unspecified: Secondary | ICD-10-CM | POA: Diagnosis not present

## 2018-02-10 MED ORDER — HYDROCORTISONE 2.5 % EX CREA: TOPICAL_CREAM | Freq: Two times a day (BID) | CUTANEOUS | 5 refills | Status: DC

## 2018-02-10 NOTE — Progress Notes (Signed)
   Subjective:    Patient ID: Allen Wheeler, male    DOB: 12/31/1919, 84 y.o.   MRN: [id number]  HPI Here for 2 issues. First he has had a rash on the left leg for over 6 months and it seems to be spreading slowly. The skin turns red and scaly and it itches. He tried OTC hydrocortisone cream and had some partial results. He then saw his [place] primary care doctor recently who gave him Triamcinolone cream, but this did work as well as the hydrocortisone did. Second for about 3 months he notes a pattern of getting diarrhea shortly after eating breakfast and lunch every day. This lasts about 15 minutes and then stops. When he eats supper this never happens. Of note we started him on OTC magnesium tablets a few months ago for leg cramps, and this has helped. He has been taking these when he gets up in the mornings.    Review of Systems  Constitutional: Negative.   Respiratory: Negative.   Cardiovascular: Negative.   Gastrointestinal: Positive for diarrhea. Negative for abdominal distention, abdominal pain, blood in stool, nausea and vomiting.  Skin: Positive for rash.  Neurological: Negative.        Objective:   Physical Exam  Constitutional: He is oriented to person, place, and time. He appears well-developed and well-nourished.  Cardiovascular: Normal rate, regular rhythm, normal heart sounds and intact distal pulses.  Pulmonary/Chest: Effort normal and breath sounds normal.  Abdominal: Soft. Bowel sounds are normal. He exhibits no distension and no mass. There is no tenderness. There is no rebound and no guarding.  Neurological: He is alert and oriented to person, place, and time.  Skin:  The left lower leg has diffuse areas of macular red scaly skin and multiple tiny varicosities under the skin           Assessment & Plan:  First his morning diarrhea may be a side effect of the magnesium tablets. I asked him to try taking these at bedtime, and he agreed. Second he has dyshydrotic  eczema on the left leg. We will let him try 2.5% hydrocortisone cream BID. Recheck prn.  Alysia Penna, MD

## 2018-02-14 ENCOUNTER — Other Ambulatory Visit: Payer: Self-pay | Admitting: Family Medicine

## 2018-02-20 ENCOUNTER — Other Ambulatory Visit: Payer: Self-pay | Admitting: Family Medicine

## 2018-02-24 ENCOUNTER — Other Ambulatory Visit: Payer: Self-pay | Admitting: Family Medicine

## 2018-02-24 NOTE — Telephone Encounter (Signed)
Copied from [place] [id number]. Topic: Quick Communication - Rx Refill/Question >> Feb 24, 2018 10:10 AM Carolyn Stare wrote: Medication    gabapentin (NEURONTIN) 100 MG capsule    LORazepam (ATIVAN) 1 MG tablet   Pt did contact [place]         Preferred Pharmacy (with phone number or street name): ***  Agent: Please be advised that RX refills may take up to 3 business days. We ask that you follow-up with your pharmacy.

## 2018-02-24 NOTE — Telephone Encounter (Signed)
Copied from [place] [id number] [id number]. Topic: Quick Communication - Rx Refill/Question >> Feb 24, 2018 10:13 AM Carolyn Stare wrote: Medication     gabapentin (NEURONTIN) 100 MG capsule   LORazepam (ATIVAN) 1 MG tablet      Has the patient contacted their pharmacy  yes    Preferred [facility] [facility]: Please be advised that RX refills may take up to 3 business days. We ask that you follow-up with your pharmacy.

## 2018-02-24 NOTE — Telephone Encounter (Signed)
Copied from [place] [id number]. Topic: Quick Communication - Rx Refill/Question >> Feb 24, 2018 10:10 AM Carolyn Stare wrote: Medication    gabapentin (NEURONTIN) 100 MG capsule    LORazepam (ATIVAN) 1 MG tablet   Pt did contact [place]         Preferred Pharmacy (with phone number or street name): ***  Agent: Please be advised that RX refills may take up to 3 business days. We ask that you follow-up with your pharmacy.

## 2018-02-25 MED ORDER — GABAPENTIN 100 MG PO CAPS: 100.0000 mg | ORAL_CAPSULE | Freq: Every day | ORAL | 1 refills | Status: DC

## 2018-02-25 NOTE — Telephone Encounter (Signed)
Dr. Fry please advise on refill. Thanks  

## 2018-02-25 NOTE — Telephone Encounter (Signed)
Requested medication (s) are due for refill today: yes  Requested medication (s) are on the active medication list: yes  Last refill:  08/21/17 #90 with 1 refill  Future visit scheduled: No  Notes to clinic:  Unable to fill per protocol    Requested Prescriptions  Pending Prescriptions Disp Refills   LORazepam (ATIVAN) 1 MG tablet 90 tablet 1    Sig: Take 1 tablet (1 mg total) by mouth at bedtime as needed.     Not Delegated - Psychiatry:  Anxiolytics/Hypnotics Failed - 02/25/2018  7:40 AM      Failed - This refill cannot be delegated      Failed - Urine Drug Screen completed in last 360 days.      Passed - Valid encounter within last 6 months    Recent Outpatient Visits          2 weeks ago Diarrhea, unspecified type   [facility] at [place], [place]   10 months ago Laceration of skin of left lower leg, subsequent encounter   [facility] at [place], [place]   10 months ago Wound of left lower extremity, subsequent encounter   Therapist, [facility] at [facility], Alinda Sierras, MD   11 months ago Pruritic disorder   Therapist, music at [facility], Ishmael Holter, MD   12 months ago Pruritus   Therapist, music at [place], [place]           Signed Prescriptions Disp Refills   gabapentin (NEURONTIN) 100 MG capsule 90 capsule 1    Sig: Take 1 capsule (100 mg total) by mouth at bedtime.     Neurology: Anticonvulsants - gabapentin Passed - 02/25/2018  7:40 AM      Passed - Valid encounter within last 12 months    Recent Outpatient Visits          2 weeks ago Diarrhea, unspecified type   Therapist, music at [place], [place]   10 months ago Laceration of skin of left lower leg, subsequent encounter   [facility] at [place], [place]   10 months ago Wound of left lower extremity, subsequent encounter   Therapist, music at [place], [place]   11 months ago Pruritic  disorder   Therapist, music at [place], [place]   12 months ago Pruritus   Therapist, music at [facility], Ishmael Holter, MD

## 2018-02-25 NOTE — Telephone Encounter (Signed)
Dr. Sarajane Jews patient. Thanks!

## 2018-02-28 MED ORDER — LORAZEPAM 1 MG PO TABS: 1.0000 mg | ORAL_TABLET | Freq: Every evening | ORAL | 1 refills | Status: DC | PRN

## 2018-03-03 NOTE — Telephone Encounter (Signed)
Heather with [facility] calling.  States they have not received a response on either of these medications yet.  Per Nurse Triage route back to office, status is "phone in" these may need to be resent electronically.

## 2018-03-04 NOTE — Telephone Encounter (Signed)
Dr. Fry please advise. Thanks  

## 2018-03-11 ENCOUNTER — Other Ambulatory Visit: Payer: Self-pay | Admitting: *Deleted

## 2018-03-11 ENCOUNTER — Other Ambulatory Visit: Payer: Self-pay | Admitting: Family Medicine

## 2018-03-13 ENCOUNTER — Other Ambulatory Visit: Payer: Self-pay | Admitting: Family Medicine

## 2018-03-22 IMAGING — US US ABDOMEN LIMITED
1 series · 14 of 25 positions shown · non-contrast
Comparison: CT abdomen and pelvis 02/14/2016

CLINICAL DATA: Right upper quadrant pain for 1 day.

EXAM:
US ABDOMEN LIMITED - RIGHT UPPER QUADRANT

[Series 1: us abdomen limited · 0.26mm/px · 14 of 47 slices shown]
[im 1/47]
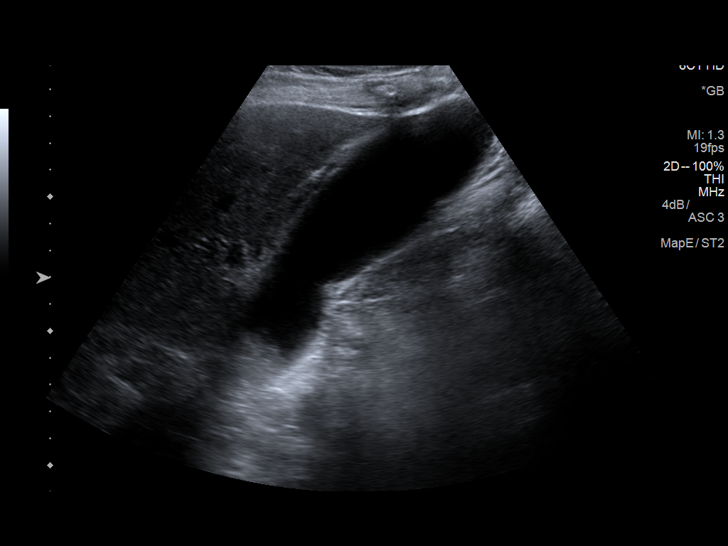
[im 4/47]
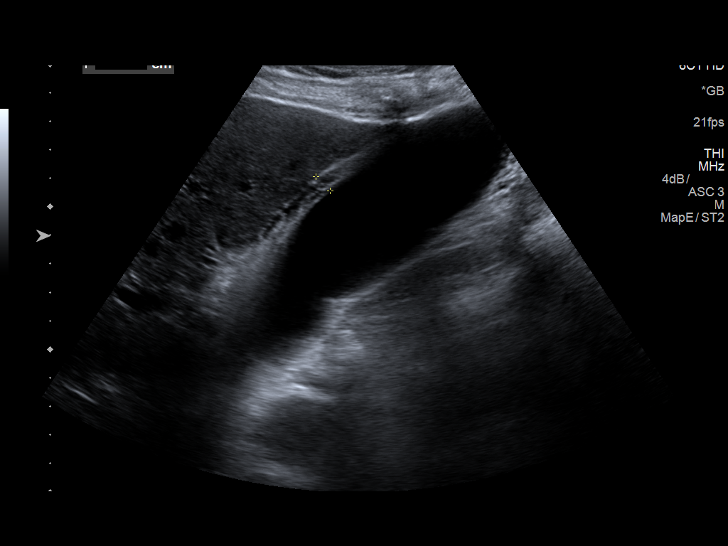
[im 8/47]
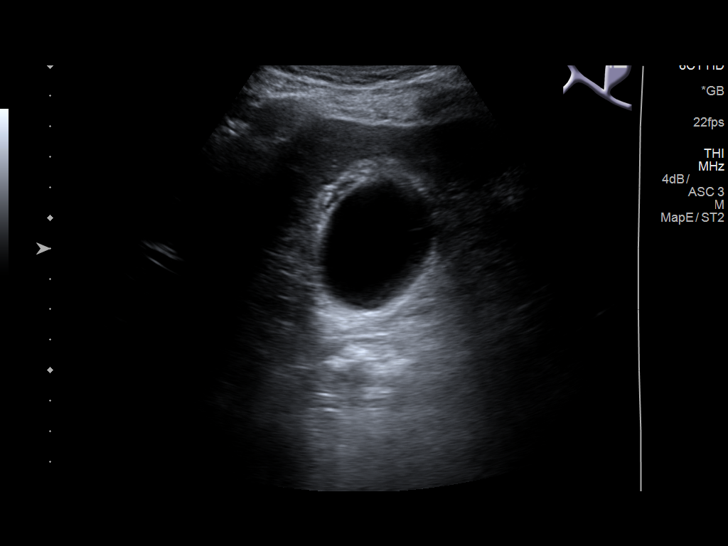
[im 12/47]
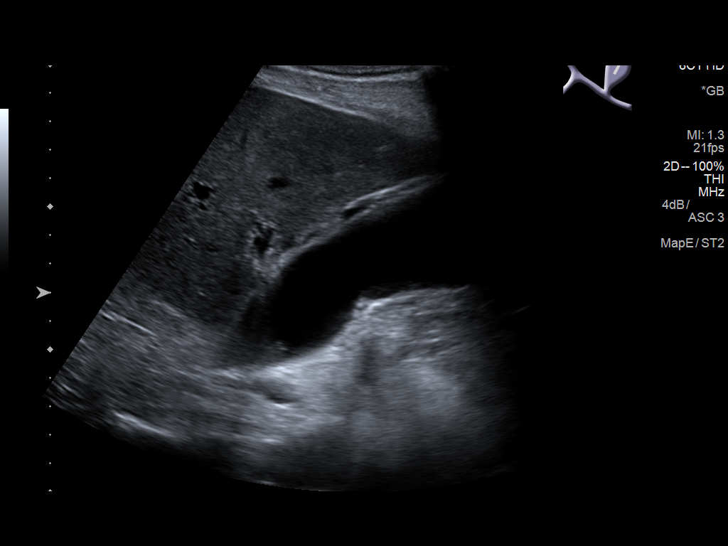
[im 16/47]
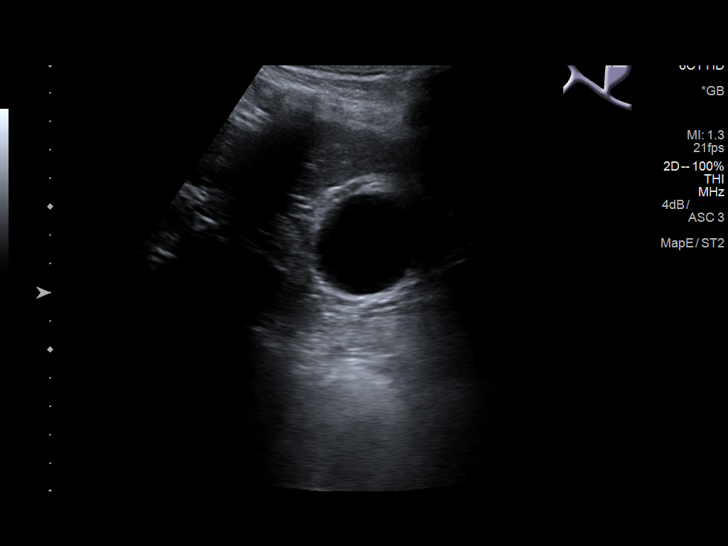
[im 18/47]
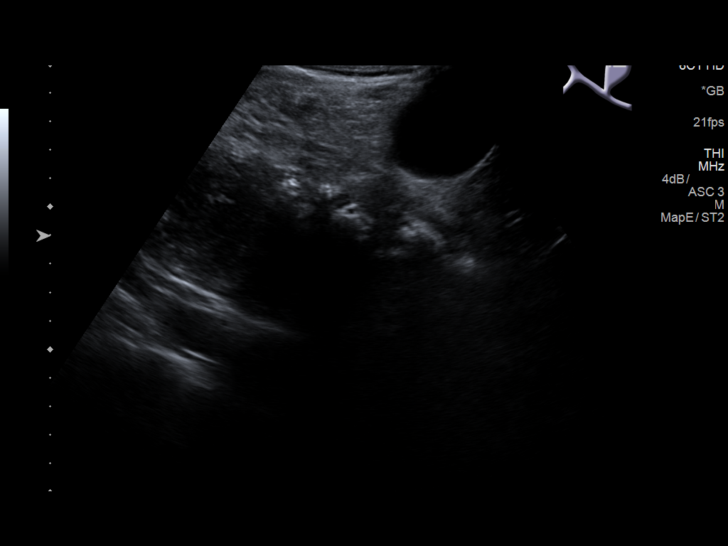
[im 22/47]
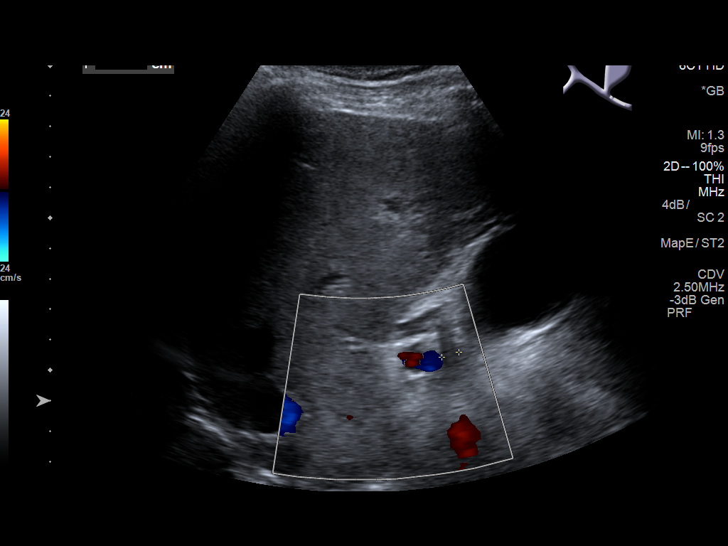
[im 25/47]
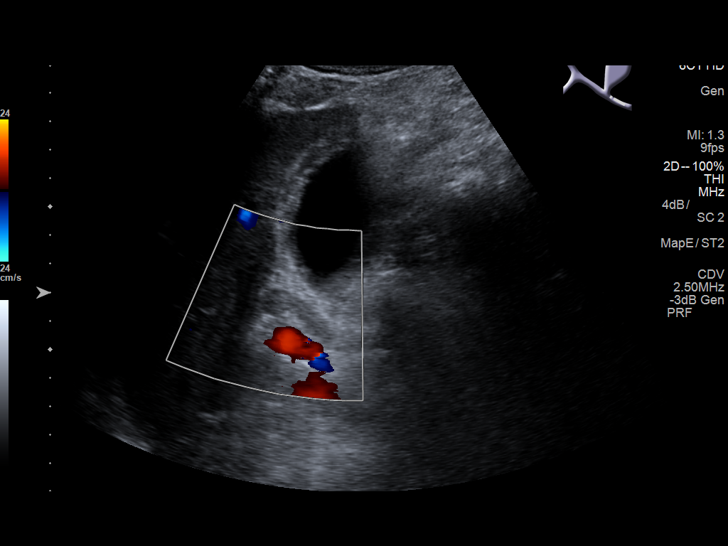
[im 29/47]
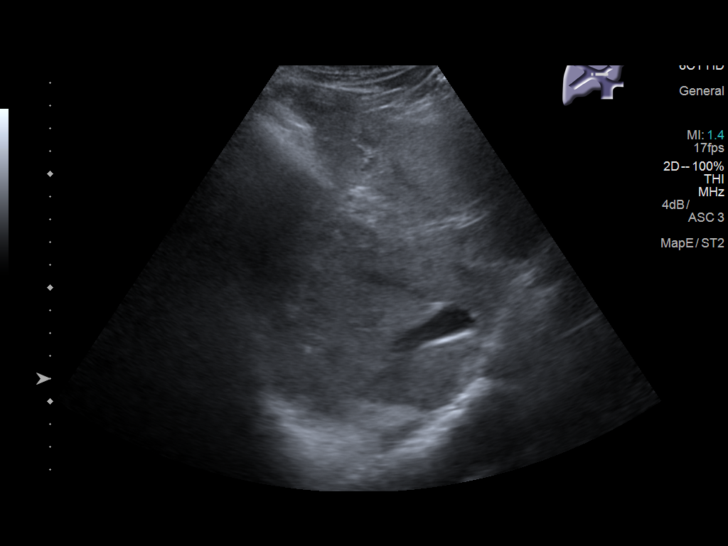
[im 31/47]
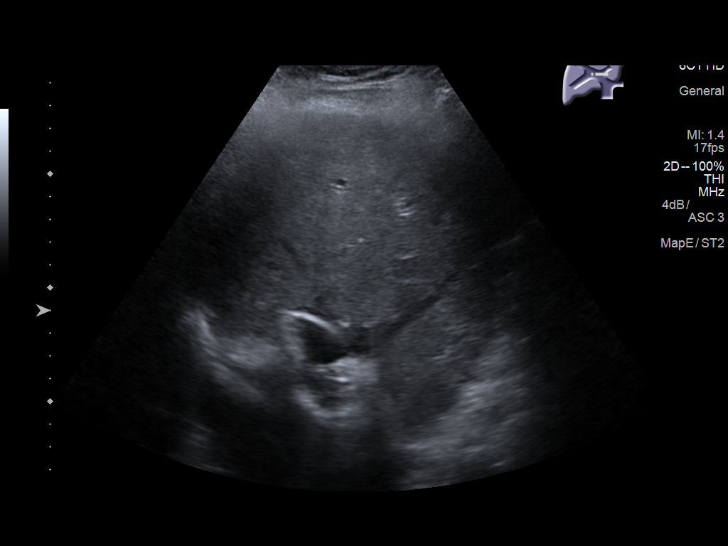
[im 35/47]
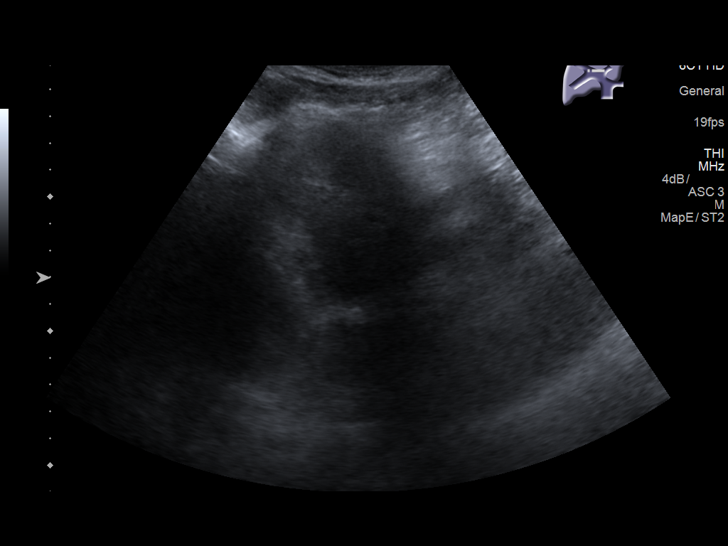
[im 39/47]
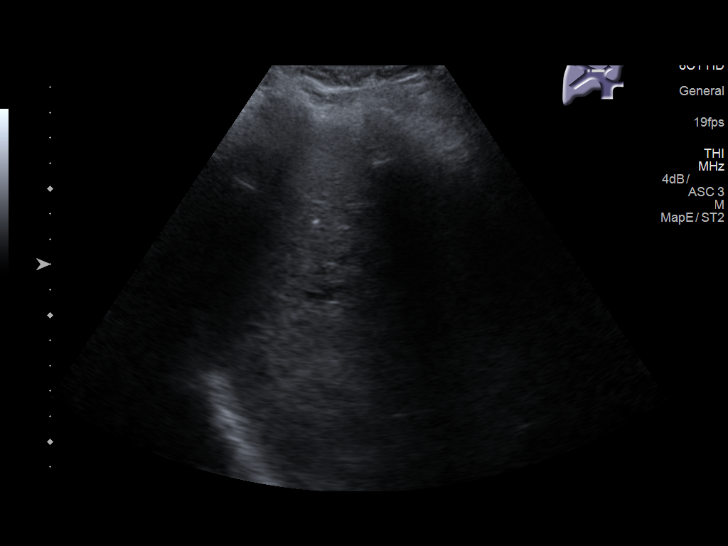
[im 43/47]
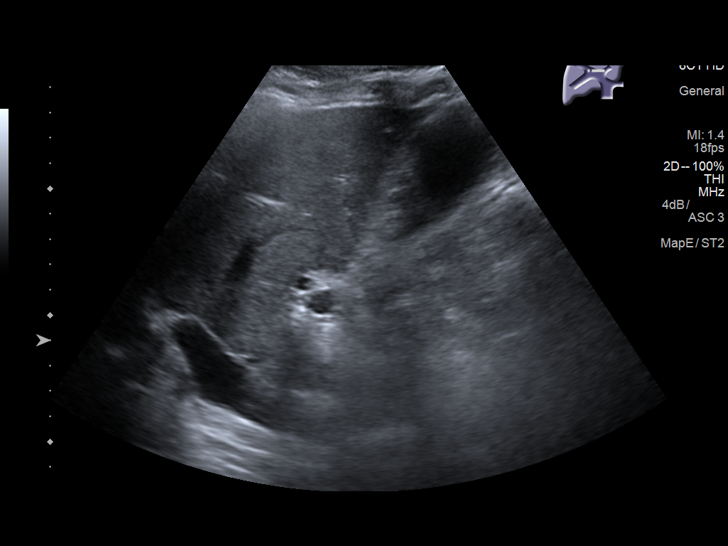
[im 47/47]
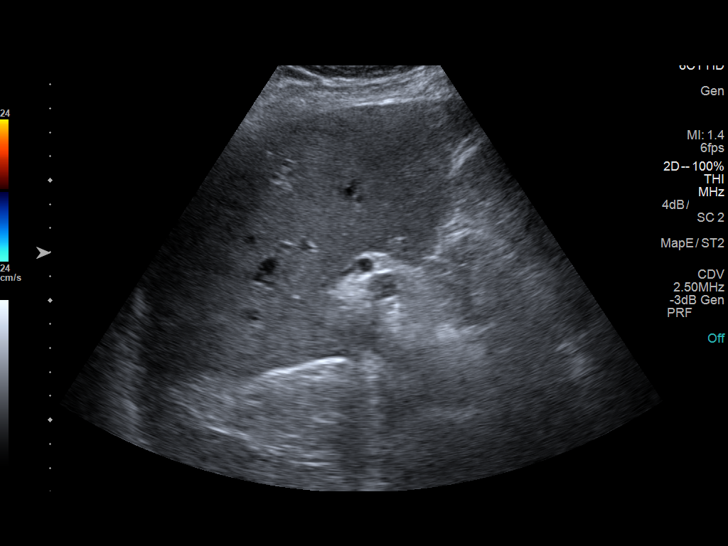

[14 of 25 positions shown; findings below may reference images not displayed]

FINDINGS: Gallbladder:

The gallbladder wall thickening (or combination of gallbladder wall
and trace pericholecystic fluid) measures up to 7 mm in thickness.
Small amount of gallbladder sludge. No sonographic Murphy sign noted
by sonographer.

Common bile duct:

Diameter: 8 mm.  Distal portion not well visualized.

Liver:

Mild intrahepatic biliary dilatation. No focal liver lesion
identified, however the left lobe was suboptimally visualized due to
bowel gas.
IMPRESSION: 1. Small amount of gallbladder sludge with mild gallbladder wall
thickening (and/or trace pericholecystic fluid). Acute cholecystitis
is not excluded in the appropriate clinical setting, however
sonographic Murphy sign was negative.
2. Mild intrahepatic and borderline extrahepatic biliary dilatation.
The distal common bile duct was not well seen. MRCP or ERCP could be
considered to further evaluate the possible distal common duct stone
or other debris described on CT.

## 2018-04-17 ENCOUNTER — Other Ambulatory Visit: Payer: Self-pay | Admitting: Family Medicine

## 2018-04-19 ENCOUNTER — Other Ambulatory Visit: Payer: Self-pay | Admitting: Family Medicine

## 2018-04-19 DIAGNOSIS — E039 Hypothyroidism, unspecified: Secondary | ICD-10-CM

## 2018-04-20 DIAGNOSIS — N183 Chronic kidney disease, stage 3 (moderate): Secondary | ICD-10-CM | POA: Diagnosis present

## 2018-04-20 DIAGNOSIS — M8580 Other specified disorders of bone density and structure, unspecified site: Secondary | ICD-10-CM | POA: Diagnosis present

## 2018-04-20 DIAGNOSIS — J9601 Acute respiratory failure with hypoxia: Secondary | ICD-10-CM | POA: Diagnosis present

## 2018-04-20 DIAGNOSIS — E039 Hypothyroidism, unspecified: Secondary | ICD-10-CM | POA: Diagnosis present

## 2018-04-20 DIAGNOSIS — J168 Pneumonia due to other specified infectious organisms: Secondary | ICD-10-CM | POA: Diagnosis not present

## 2018-04-20 DIAGNOSIS — R21 Rash and other nonspecific skin eruption: Secondary | ICD-10-CM | POA: Diagnosis present

## 2018-04-20 DIAGNOSIS — I517 Cardiomegaly: Secondary | ICD-10-CM | POA: Diagnosis not present

## 2018-04-20 DIAGNOSIS — R0602 Shortness of breath: Secondary | ICD-10-CM | POA: Diagnosis not present

## 2018-04-20 DIAGNOSIS — G2581 Restless legs syndrome: Secondary | ICD-10-CM | POA: Diagnosis present

## 2018-04-20 DIAGNOSIS — Z7982 Long term (current) use of aspirin: Secondary | ICD-10-CM | POA: Diagnosis not present

## 2018-04-20 DIAGNOSIS — R918 Other nonspecific abnormal finding of lung field: Secondary | ICD-10-CM | POA: Diagnosis not present

## 2018-04-20 DIAGNOSIS — R05 Cough: Secondary | ICD-10-CM | POA: Diagnosis not present

## 2018-04-20 DIAGNOSIS — I447 Left bundle-branch block, unspecified: Secondary | ICD-10-CM | POA: Diagnosis not present

## 2018-04-20 DIAGNOSIS — J189 Pneumonia, unspecified organism: Secondary | ICD-10-CM | POA: Diagnosis not present

## 2018-04-20 DIAGNOSIS — E2740 Unspecified adrenocortical insufficiency: Secondary | ICD-10-CM | POA: Diagnosis not present

## 2018-04-20 DIAGNOSIS — Z79899 Other long term (current) drug therapy: Secondary | ICD-10-CM | POA: Diagnosis not present

## 2018-04-20 DIAGNOSIS — Z7952 Long term (current) use of systemic steroids: Secondary | ICD-10-CM | POA: Diagnosis not present

## 2018-04-20 DIAGNOSIS — N179 Acute kidney failure, unspecified: Secondary | ICD-10-CM | POA: Diagnosis present

## 2018-04-20 DIAGNOSIS — R0902 Hypoxemia: Secondary | ICD-10-CM | POA: Diagnosis not present

## 2018-04-20 DIAGNOSIS — J121 Respiratory syncytial virus pneumonia: Secondary | ICD-10-CM | POA: Diagnosis present

## 2018-04-24 MED ORDER — PANTOPRAZOLE SODIUM 40 MG PO TBEC
40.00 | DELAYED_RELEASE_TABLET | ORAL | Status: DC
Start: 2018-04-24 — End: 2018-04-24

## 2018-04-24 MED ORDER — GABAPENTIN 100 MG PO CAPS
100.00 | ORAL_CAPSULE | ORAL | Status: DC
Start: 2018-04-23 — End: 2018-04-24

## 2018-04-24 MED ORDER — GENERIC EXTERNAL MEDICATION
650.00 | Status: DC
Start: ? — End: 2018-04-24

## 2018-04-24 MED ORDER — ALUM & MAG HYDROXIDE-SIMETH 200-200-20 MG/5ML PO SUSP
30.00 | ORAL | Status: DC
Start: ? — End: 2018-04-24

## 2018-04-24 MED ORDER — ENOXAPARIN SODIUM 30 MG/0.3ML ~~LOC~~ SOLN
30.00 | SUBCUTANEOUS | Status: DC
Start: 2018-04-23 — End: 2018-04-24

## 2018-04-24 MED ORDER — CITALOPRAM HYDROBROMIDE 20 MG PO TABS
10.00 | ORAL_TABLET | ORAL | Status: DC
Start: 2018-04-23 — End: 2018-04-24

## 2018-04-24 MED ORDER — LORAZEPAM 1 MG PO TABS
1.00 | ORAL_TABLET | ORAL | Status: DC
Start: ? — End: 2018-04-24

## 2018-04-24 MED ORDER — CEFDINIR 300 MG PO CAPS
300.00 | ORAL_CAPSULE | ORAL | Status: DC
Start: 2018-04-23 — End: 2018-04-24

## 2018-04-24 MED ORDER — PRAMIPEXOLE DIHYDROCHLORIDE 0.25 MG PO TABS
0.13 | ORAL_TABLET | ORAL | Status: DC
Start: 2018-04-23 — End: 2018-04-24

## 2018-04-24 MED ORDER — GENERIC EXTERNAL MEDICATION
10.00 | Status: DC
Start: ? — End: 2018-04-24

## 2018-04-24 MED ORDER — LEVOTHYROXINE SODIUM 50 MCG PO TABS
50.00 | ORAL_TABLET | ORAL | Status: DC
Start: 2018-04-24 — End: 2018-04-24

## 2018-04-24 MED ORDER — AZITHROMYCIN 250 MG PO TABS
500.00 | ORAL_TABLET | ORAL | Status: DC
Start: 2018-04-23 — End: 2018-04-24

## 2018-04-24 MED ORDER — ASPIRIN EC 81 MG PO TBEC
81.00 | DELAYED_RELEASE_TABLET | ORAL | Status: DC
Start: 2018-04-24 — End: 2018-04-24

## 2018-04-24 MED ORDER — SODIUM CHLORIDE 0.9 % IV SOLN
10.00 | INTRAVENOUS | Status: DC
Start: ? — End: 2018-04-24

## 2018-04-24 MED ORDER — HYDRALAZINE HCL 20 MG/ML IJ SOLN
10.00 | INTRAMUSCULAR | Status: DC
Start: ? — End: 2018-04-24

## 2018-04-24 MED ORDER — GENERIC EXTERNAL MEDICATION
.50 | Status: DC
Start: ? — End: 2018-04-24

## 2018-04-24 MED ORDER — BENZONATATE 100 MG PO CAPS
100.00 | ORAL_CAPSULE | ORAL | Status: DC
Start: ? — End: 2018-04-24

## 2018-04-24 MED ORDER — DM-GUAIFENESIN ER 30-600 MG PO TB12
1.00 | ORAL_TABLET | ORAL | Status: DC
Start: 2018-04-23 — End: 2018-04-24

## 2018-04-24 MED ORDER — ALBUTEROL SULFATE (2.5 MG/3ML) 0.083% IN NEBU
2.50 | INHALATION_SOLUTION | RESPIRATORY_TRACT | Status: DC
Start: ? — End: 2018-04-24

## 2018-04-24 MED ORDER — ACETAMINOPHEN 325 MG PO TABS
650.00 | ORAL_TABLET | ORAL | Status: DC
Start: ? — End: 2018-04-24

## 2018-04-24 MED ORDER — MAGNESIUM OXIDE 400 MG PO TABS
400.00 | ORAL_TABLET | ORAL | Status: DC
Start: 2018-04-23 — End: 2018-04-24

## 2018-04-24 MED ORDER — GENERIC EXTERNAL MEDICATION
30.00 | Status: DC
Start: ? — End: 2018-04-24

## 2018-04-24 MED ORDER — GENERIC EXTERNAL MEDICATION
4.00 | Status: DC
Start: ? — End: 2018-04-24

## 2018-04-24 MED ORDER — NITROGLYCERIN 0.4 MG SL SUBL
.40 | SUBLINGUAL_TABLET | SUBLINGUAL | Status: DC
Start: ? — End: 2018-04-24

## 2018-04-29 ENCOUNTER — Ambulatory Visit (INDEPENDENT_AMBULATORY_CARE_PROVIDER_SITE_OTHER): Payer: Medicare Other | Admitting: Family Medicine

## 2018-04-29 ENCOUNTER — Ambulatory Visit (INDEPENDENT_AMBULATORY_CARE_PROVIDER_SITE_OTHER): Payer: Medicare Other

## 2018-04-29 ENCOUNTER — Encounter: Payer: Self-pay | Admitting: Family Medicine

## 2018-04-29 VITALS — BP 124/84 | HR 58 | Temp 98.2°F | Wt 162.1 lb

## 2018-04-29 DIAGNOSIS — R05 Cough: Secondary | ICD-10-CM | POA: Diagnosis not present

## 2018-04-29 DIAGNOSIS — J121 Respiratory syncytial virus pneumonia: Secondary | ICD-10-CM

## 2018-04-29 MED ORDER — ALBUTEROL SULFATE HFA 108 (90 BASE) MCG/ACT IN AERS: 2.0000 | INHALATION_SPRAY | RESPIRATORY_TRACT | 2 refills | Status: DC | PRN

## 2018-04-29 NOTE — Progress Notes (Signed)
   Subjective:    Patient ID: Allen Wheeler, male    DOB: 04/20/1919, 85 y.o.   MRN: [id number]  HPI Here to follow up a hospital stay at [facility] in [place] from 04-20-18 to 04-23-18 for a pneumonia caused by an RSV infection. He presented with coughing, wheezing, SOB, and fever to 102 degrees. A CXR revealed patchy infiltrates and a nasal swab was positive for RSV. Blood cultures were all negative. He was given [place] oxygen for a time, but he recovered quickly. He was sent home on Azithromycin and Cefdinir, which he has finished. He has a Ventolin inhaler which he has used a few times. He feels better but he still has a dry cough and he feels weak. The fever is gone. His appetite is good. His RA O2 sat today is 95%.    Review of Systems  Constitutional: Positive for fatigue. Negative for fever.  HENT: Negative.   Respiratory: Positive for cough and wheezing. Negative for chest tightness and shortness of breath.   Cardiovascular: Negative.   Neurological: Negative.        Objective:   Physical Exam Constitutional:      General: He is not in acute distress.    Appearance: Normal appearance.  Cardiovascular:     Rate and Rhythm: Normal rate and regular rhythm.     Pulses: Normal pulses.     Heart sounds: Normal heart sounds.  Pulmonary:     Effort: Pulmonary effort is normal. No respiratory distress.     Breath sounds: No wheezing or rales.     Comments: There are some scattered rhonchi  Neurological:     General: No focal deficit present.     Mental Status: He is alert and oriented to person, place, and time.           Assessment & Plan:  He is following up from an RSV pneumonia. His CXR today is clear, and he is recovering nicely. I advised him the cough can linger for a few weeks after this type of infection. He can use Delsum and his inhaler as needed. Recheck prn.  Alysia Penna, MD

## 2018-06-16 DIAGNOSIS — H353131 Nonexudative age-related macular degeneration, bilateral, early dry stage: Secondary | ICD-10-CM | POA: Diagnosis not present

## 2018-06-16 DIAGNOSIS — Z961 Presence of intraocular lens: Secondary | ICD-10-CM | POA: Diagnosis not present

## 2018-06-23 ENCOUNTER — Other Ambulatory Visit: Payer: Self-pay | Admitting: Family Medicine

## 2018-06-26 ENCOUNTER — Encounter: Payer: Medicare Other | Admitting: Family Medicine

## 2018-07-07 ENCOUNTER — Other Ambulatory Visit: Payer: Self-pay | Admitting: Family Medicine

## 2018-07-14 ENCOUNTER — Other Ambulatory Visit: Payer: Self-pay | Admitting: Family Medicine

## 2018-07-21 ENCOUNTER — Other Ambulatory Visit: Payer: Self-pay | Admitting: Family Medicine

## 2018-07-21 DIAGNOSIS — E039 Hypothyroidism, unspecified: Secondary | ICD-10-CM

## 2018-07-29 ENCOUNTER — Telehealth: Payer: Self-pay | Admitting: Family Medicine

## 2018-07-29 NOTE — Telephone Encounter (Signed)
Pt sent in via mail a [employer] form to be completed by the provider.  Upon completion pt would like to have it mailed to [facility] [address] [place], [place] [address].  Placed in providers folder for completion.

## 2018-07-30 NOTE — Telephone Encounter (Signed)
Form has been placed in the mail to the pt

## 2018-09-03 ENCOUNTER — Other Ambulatory Visit: Payer: Self-pay | Admitting: Family Medicine

## 2018-09-08 NOTE — Telephone Encounter (Signed)
Dr. Sarajane Jews please advise on the lorazepam refill,  thanks

## 2018-09-12 ENCOUNTER — Other Ambulatory Visit: Payer: Self-pay | Admitting: Family Medicine

## 2018-09-26 ENCOUNTER — Other Ambulatory Visit: Payer: Self-pay

## 2018-09-26 ENCOUNTER — Ambulatory Visit (INDEPENDENT_AMBULATORY_CARE_PROVIDER_SITE_OTHER): Payer: Medicare Other | Admitting: Family Medicine

## 2018-09-26 ENCOUNTER — Encounter: Payer: Self-pay | Admitting: Family Medicine

## 2018-09-26 DIAGNOSIS — D692 Other nonthrombocytopenic purpura: Secondary | ICD-10-CM | POA: Diagnosis not present

## 2018-09-26 MED ORDER — PREDNISONE 10 MG PO TABS: 5.0000 mg | ORAL_TABLET | Freq: Every day | ORAL | 1 refills | Status: DC

## 2018-09-26 NOTE — Progress Notes (Signed)
   Subjective:    Patient ID: Allen Wheeler, male    DOB: 06-25-1919, 85 y.o.   MRN: [id number]  HPI Virtual Visit via Telephone Note  I connected with the patient on 09/26/18 at 10:00 AM EDT by telephone and verified that I am speaking with the correct person using two identifiers. We attempted to connect virtually but we had technical difficulties with the audio and video.     I discussed the limitations, risks, security and privacy concerns of performing an evaluation and management service by telephone and the availability of in person appointments. I also discussed with the patient that there may be a patient responsible charge related to this service. The patient expressed understanding and agreed to proceed.  Location patient: home Location provider: work or home office Participants present for the call: patient, provider Patient did not have a visit in the prior 7 days to address this/these issue(s).   History of Present Illness: He is asking about purple splotches of skin on the arms and legs. These do not bother him in any way. We have spoken about them in the past. He asks if then can be related to his medications.   Observations/Objective: Patient sounds cheerful and well on the phone. I do not appreciate any SOB. Speech and thought processing are grossly intact. Patient reported vitals:  Assessment and Plan: Purpura, he understands that these are the result of broken blood vessels under the skin. Of all his medications, the most likely contributors are the aspirin and the Prednisone. We agreed that he will stop taking aspirin altogether, and he will take 1/2 tablets (5 mg) of Prednisone daily. Recheck prn. Alysia Penna, MD   Follow Up Instructions:     [phone] 11-20 [phone] 21-30 I did not refer this patient for an OV in the next 24 hours for this/these issue(s).  I discussed the assessment and treatment plan with the patient. The patient was provided an  opportunity to ask questions and all were answered. The patient agreed with the plan and demonstrated an understanding of the instructions.   The patient was advised to call back or seek an in-person evaluation if the symptoms worsen or if the condition fails to improve as anticipated.  I provided 11 minutes of non-face-to-face time during this encounter.   Alysia Penna, MD    Review of Systems     Objective:   Physical Exam        Assessment & Plan:

## 2018-10-08 ENCOUNTER — Other Ambulatory Visit: Payer: Self-pay | Admitting: Family Medicine

## 2018-10-20 ENCOUNTER — Other Ambulatory Visit: Payer: Self-pay | Admitting: Family Medicine

## 2018-10-27 NOTE — Telephone Encounter (Signed)
Allen Wheeler with [facility] calling to check on the status of this medication.

## 2018-10-28 NOTE — Telephone Encounter (Signed)
Kim from [facility] called to check on the status of the refill they requested below/ please advise

## 2018-11-07 ENCOUNTER — Other Ambulatory Visit: Payer: Self-pay

## 2018-12-16 ENCOUNTER — Encounter: Payer: Self-pay | Admitting: Family Medicine

## 2018-12-16 ENCOUNTER — Ambulatory Visit (INDEPENDENT_AMBULATORY_CARE_PROVIDER_SITE_OTHER): Payer: Medicare Other | Admitting: Family Medicine

## 2018-12-16 ENCOUNTER — Other Ambulatory Visit: Payer: Self-pay

## 2018-12-16 VITALS — BP 130/70 | HR 66 | Temp 97.6°F | Wt 163.4 lb

## 2018-12-16 DIAGNOSIS — Z23 Encounter for immunization: Secondary | ICD-10-CM | POA: Diagnosis not present

## 2018-12-16 DIAGNOSIS — H9211 Otorrhea, right ear: Secondary | ICD-10-CM

## 2018-12-16 DIAGNOSIS — K591 Functional diarrhea: Secondary | ICD-10-CM

## 2018-12-16 NOTE — Progress Notes (Signed)
   Subjective:    Patient ID: Allen Wheeler, male    DOB: 1919-09-18, 85 y.o.   MRN: [id number]  HPI Here for several issues. First his right ear has been draining ofr a few weeks. He uses Ciprodex drops but they are not helping. No ear pain. Also he has stopped eating breakfast because no matter what he eats this results in explosive diarrhea. No abdominal cramps or nausea. He has also stopped drinking coffee. Eating other meals during the day do not have this effect, and his BMs at other times during the day are normal.    Review of Systems  Constitutional: Negative.   HENT: Positive for ear discharge. Negative for ear pain.   Respiratory: Negative.   Cardiovascular: Negative.   Gastrointestinal: Positive for diarrhea. Negative for abdominal distention, abdominal pain, anal bleeding, blood in stool, constipation, nausea, rectal pain and vomiting.  Genitourinary: Negative.        Objective:   Physical Exam Constitutional:      Appearance: Normal appearance.  HENT:     Right Ear: External ear normal.     Left Ear: Tympanic membrane, ear canal and external ear normal.     Ears:     Comments: The right TM is perforated. The right canal is covered by a yellowish exudate     Nose: Nose normal.     Mouth/Throat:     Pharynx: Oropharynx is clear.  Eyes:     Conjunctiva/sclera: Conjunctivae normal.  Cardiovascular:     Rate and Rhythm: Normal rate and regular rhythm.     Pulses: Normal pulses.     Heart sounds: Normal heart sounds.  Pulmonary:     Effort: Pulmonary effort is normal.     Breath sounds: Normal breath sounds.  Abdominal:     General: Abdomen is flat. Bowel sounds are normal. There is no distension.     Palpations: Abdomen is soft. There is no mass.     Tenderness: There is no abdominal tenderness. There is no right CVA tenderness, guarding or rebound.     Hernia: No hernia is present.  Lymphadenopathy:     Cervical: No cervical adenopathy.  Neurological:   Mental Status: He is alert.           Assessment & Plan:  He likely has a fungal infection in the ear. I suggested he make an appt with Dr. Wilburn Cornelia, his ENT. The morning diarrhea seems to be a functional problem. I suggested he take Imodium about 30 minutes before eating breakfast. Recheck prn.  Alysia Penna, MD

## 2018-12-22 ENCOUNTER — Other Ambulatory Visit: Payer: Self-pay | Admitting: Family Medicine

## 2018-12-25 DIAGNOSIS — B354 Tinea corporis: Secondary | ICD-10-CM | POA: Diagnosis not present

## 2018-12-25 DIAGNOSIS — D692 Other nonthrombocytopenic purpura: Secondary | ICD-10-CM | POA: Diagnosis not present

## 2018-12-26 DIAGNOSIS — H906 Mixed conductive and sensorineural hearing loss, bilateral: Secondary | ICD-10-CM | POA: Diagnosis not present

## 2018-12-26 DIAGNOSIS — Z974 Presence of external hearing-aid: Secondary | ICD-10-CM | POA: Diagnosis not present

## 2018-12-26 DIAGNOSIS — H6122 Impacted cerumen, left ear: Secondary | ICD-10-CM | POA: Diagnosis not present

## 2018-12-26 DIAGNOSIS — Z87891 Personal history of nicotine dependence: Secondary | ICD-10-CM | POA: Diagnosis not present

## 2018-12-26 DIAGNOSIS — H9211 Otorrhea, right ear: Secondary | ICD-10-CM | POA: Diagnosis not present

## 2018-12-26 DIAGNOSIS — H7291 Unspecified perforation of tympanic membrane, right ear: Secondary | ICD-10-CM | POA: Diagnosis not present

## 2018-12-26 DIAGNOSIS — Z7289 Other problems related to lifestyle: Secondary | ICD-10-CM | POA: Diagnosis not present

## 2019-01-10 ENCOUNTER — Other Ambulatory Visit: Payer: Self-pay | Admitting: Family Medicine

## 2019-01-12 ENCOUNTER — Other Ambulatory Visit: Payer: Self-pay | Admitting: Family Medicine

## 2019-01-12 DIAGNOSIS — E039 Hypothyroidism, unspecified: Secondary | ICD-10-CM

## 2019-01-21 ENCOUNTER — Other Ambulatory Visit: Payer: Self-pay | Admitting: Family Medicine

## 2019-01-27 ENCOUNTER — Other Ambulatory Visit: Payer: Self-pay

## 2019-01-27 ENCOUNTER — Telehealth (INDEPENDENT_AMBULATORY_CARE_PROVIDER_SITE_OTHER): Payer: Medicare Other | Admitting: Family Medicine

## 2019-01-27 DIAGNOSIS — K529 Noninfective gastroenteritis and colitis, unspecified: Secondary | ICD-10-CM

## 2019-01-27 DIAGNOSIS — E039 Hypothyroidism, unspecified: Secondary | ICD-10-CM | POA: Diagnosis not present

## 2019-01-27 DIAGNOSIS — R197 Diarrhea, unspecified: Secondary | ICD-10-CM

## 2019-01-27 NOTE — Progress Notes (Signed)
This visit type was conducted due to national recommendations for restrictions regarding the COVID-19 pandemic in an effort to limit this patient's exposure and mitigate transmission in our community.   Virtual Visit via Telephone Note  I connected with Allen Wheeler on 01/27/19 at 11:30 AM EDT by telephone and verified that I am speaking with the correct person using two identifiers.   I discussed the limitations, risks, security and privacy concerns of performing an evaluation and management service by telephone and the availability of in person appointments. I also discussed with the patient that there may be a patient responsible charge related to this service. The patient expressed understanding and agreed to proceed.  Location patient: home Location provider: work or home office Participants present for the call: patient, provider Patient did not have a visit in the prior 7 days to address this/these issue(s).   History of Present Illness: [name] is a 85 year old male with history of GERD, kidney stones, hypothyroidism.  He relates having some diarrhea now for over a month.  He has not had thyroid functions checked in a few years.  He states that he usually has about 3 loose to occasionally watery nonbloody stools per day.  These are usually tend to be worse early in the morning.  He has not seen any blood.  No abdominal pain.  No weight loss.  He states his appetite is slightly diminished.  No history of lactose intolerance.  No gluten sensitivity.  Denies new medications.  He has had previous cholecystectomy.  No fevers or chills.  No respiratory symptoms.  He does apparently take magnesium oxide 400 mg daily.  No recent travels.  No recent antibiotics.  He took some Imodium for several days without much improvement no clear exacerbating factors   Observations/Objective: Patient sounds cheerful and well on the phone. I do not appreciate any SOB. Speech and thought processing are  grossly intact. Patient reported vitals:  Assessment and Plan:  Persistent diarrhea over 1 month duration.  He does not describe any red flags such as fever or weight loss or bloody stools.  He is on magnesium oxide which could be contributing.  Also has had previous cholecystectomy and symptoms could be related to that He does have hypothyroidism and is overdue for checking TSH.  Need to make sure he is not over replaced  -Leave off magnesium oxide -Given duration of symptoms check labs with CBC, comprehensive metabolic panel, TSH -Consider stool for C. Difficile-though no recent antibiotics -Avoid high-fat foods -If diarrhea not improving with the above could consider low-dose of Questran  Follow Up Instructions:  -Patient will be scheduled for labs tomorrow at 11 AM   [id number] 5-10 [phone] 11-20 [id number] 21-30 I did not refer this patient for an OV in the next 24 hours for this/these issue(s).  I discussed the assessment and treatment plan with the patient. The patient was provided an opportunity to ask questions and all were answered. The patient agreed with the plan and demonstrated an understanding of the instructions.   The patient was advised to call back or seek an in-person evaluation if the symptoms worsen or if the condition fails to improve as anticipated.  I provided 25 minutes of non-face-to-face time during this encounter.   Carolann Littler, MD

## 2019-01-28 ENCOUNTER — Other Ambulatory Visit (INDEPENDENT_AMBULATORY_CARE_PROVIDER_SITE_OTHER): Payer: Medicare Other

## 2019-01-28 DIAGNOSIS — K529 Noninfective gastroenteritis and colitis, unspecified: Secondary | ICD-10-CM

## 2019-01-28 LAB — COMPREHENSIVE METABOLIC PANEL
ALT: 15 U/L (ref 0–53)
AST: 18 U/L (ref 0–37)
Albumin: 3.8 g/dL (ref 3.5–5.2)
Alkaline Phosphatase: 58 U/L (ref 39–117)
BUN: 21 mg/dL (ref 6–23)
CO2: 26 mEq/L (ref 19–32)
Calcium: 8.8 mg/dL (ref 8.4–10.5)
Chloride: 106 mEq/L (ref 96–112)
Creatinine, Ser: 1.27 mg/dL (ref 0.40–1.50)
GFR: 52.47 mL/min — ABNORMAL LOW (ref >60.00)
Glucose, Bld: 70 mg/dL (ref 70–99)
Potassium: 4.2 mEq/L (ref 3.5–5.1)
Sodium: 140 mEq/L (ref 135–145)
Total Bilirubin: 0.6 mg/dL (ref 0.2–1.2)
Total Protein: 6.9 g/dL (ref 6.0–8.3)

## 2019-01-28 LAB — CBC WITH DIFFERENTIAL/PLATELET
Basophils Absolute: 0 10*3/uL (ref 0.0–0.1)
Basophils Relative: 0.3 % (ref 0.0–3.0)
Eosinophils Absolute: 0.1 10*3/uL (ref 0.0–0.7)
Eosinophils Relative: 0.9 % (ref 0.0–5.0)
HCT: 41.6 % (ref 39.0–52.0)
Hemoglobin: 13.9 g/dL (ref 13.0–17.0)
Lymphocytes Relative: 23.9 % (ref 12.0–46.0)
Lymphs Abs: 1.8 10*3/uL (ref 0.7–4.0)
MCHC: 33.4 g/dL (ref 30.0–36.0)
MCV: 96.6 fl (ref 78.0–100.0)
Monocytes Absolute: 0.6 10*3/uL (ref 0.1–1.0)
Monocytes Relative: 7.9 % (ref 3.0–12.0)
Neutro Abs: 5.1 10*3/uL (ref 1.4–7.7)
Neutrophils Relative %: 67 % (ref 43.0–77.0)
Platelets: 156 10*3/uL (ref 150.0–400.0)
RBC: 4.3 Mil/uL (ref 4.22–5.81)
RDW: 13.7 % (ref 11.5–15.5)
WBC: 7.6 10*3/uL (ref 4.0–10.5)

## 2019-01-28 NOTE — Addendum Note (Signed)
Addended by: Raliegh Ip on: 01/28/2019 12:55 PM   Modules accepted: Orders

## 2019-01-29 IMAGING — CR DG LUMBAR SPINE COMPLETE 4+V
5 series · 5 of 5 positions shown · non-contrast
Comparison: 02/14/2016 CT.

CLINICAL DATA: [AGE] male sat down 10 days ago, chair
collapsed. Patient fell on floor. Right-sided pain.

EXAM:
LUMBAR SPINE - COMPLETE 4+ VIEW

[t lumbar spine ap]
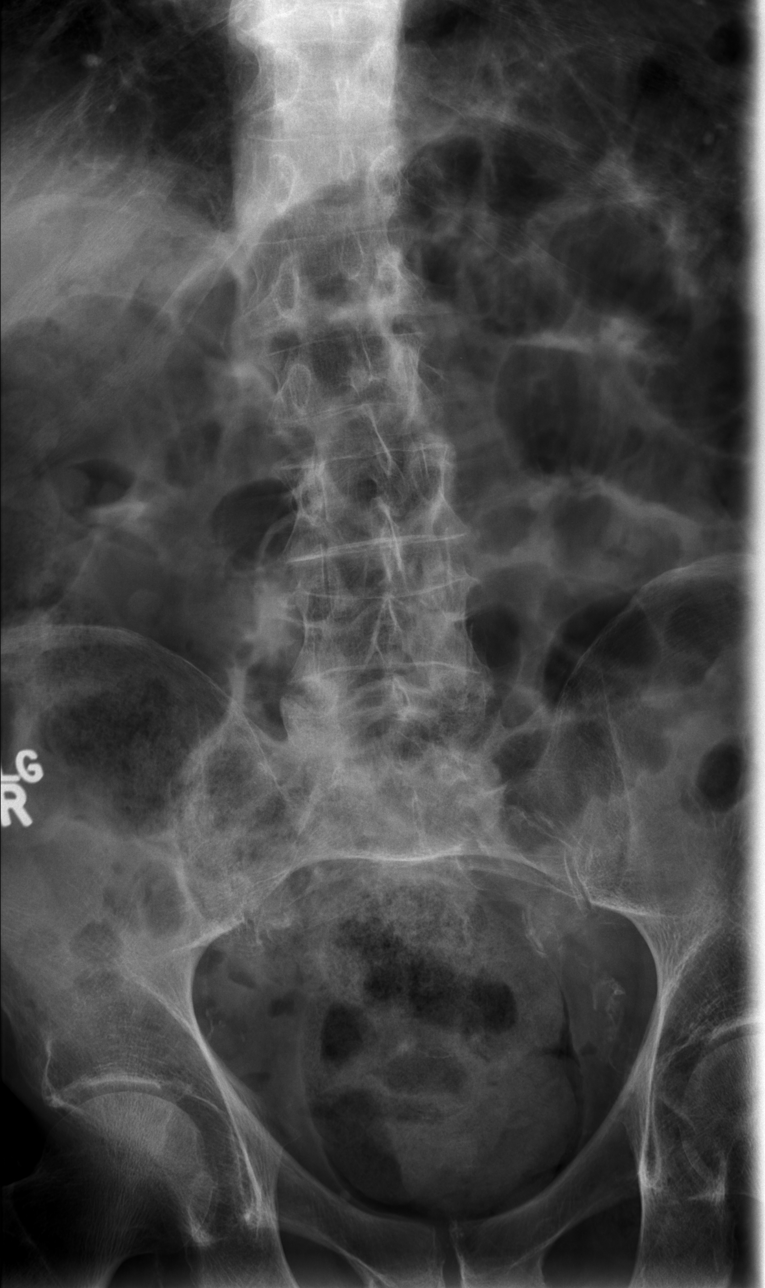

[t lumbar spine obl (1 of 2)]
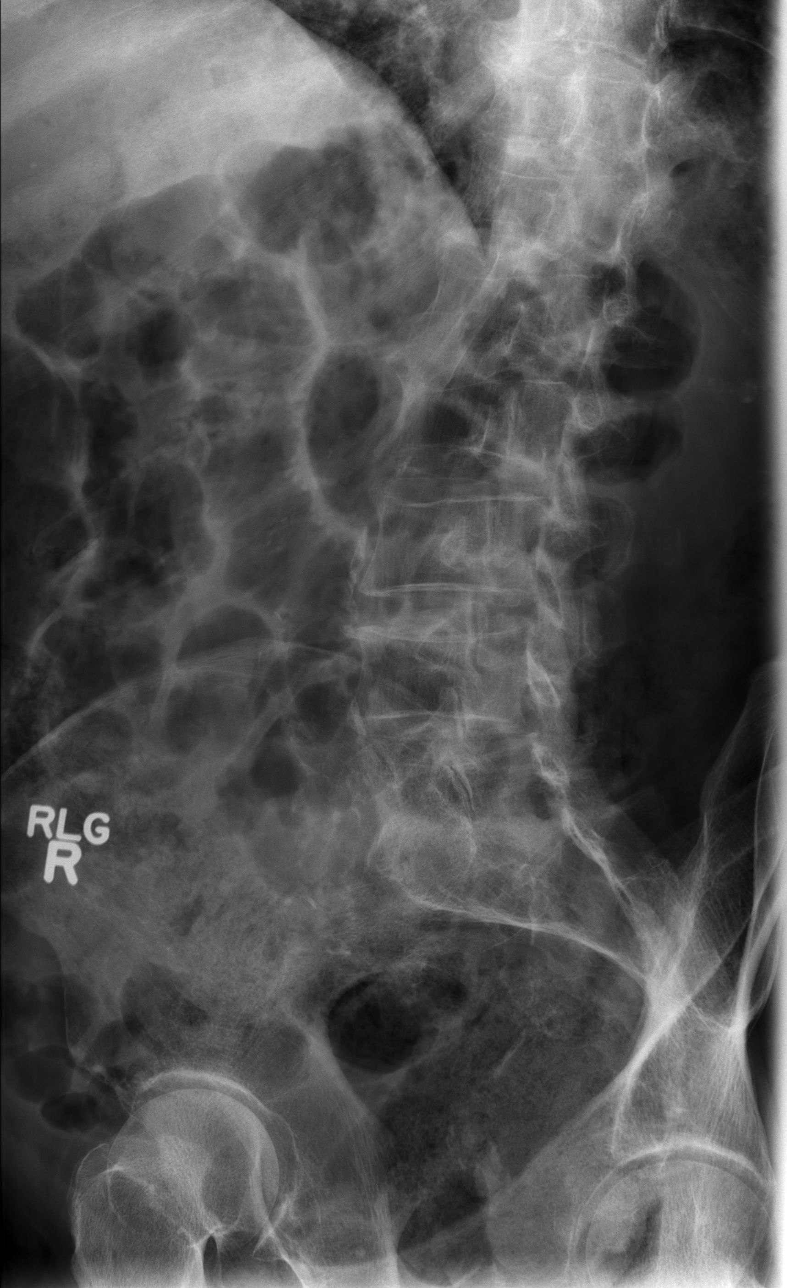

[t lumbar spine obl (2 of 2)]
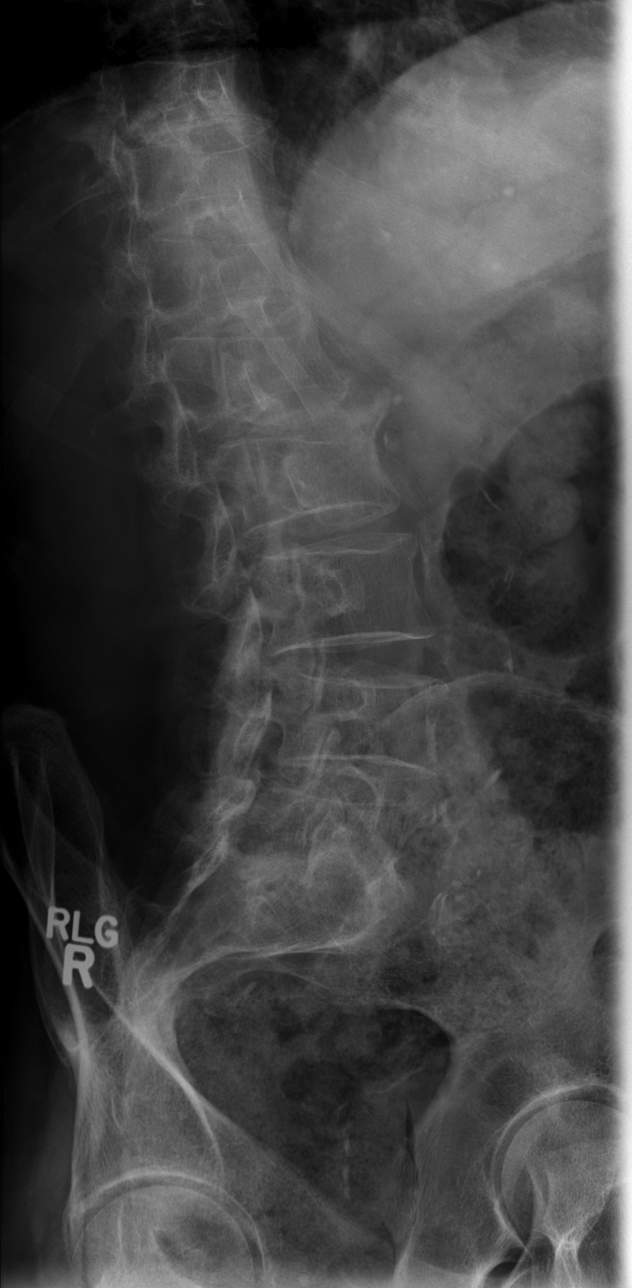

[t lumbar spine lat]
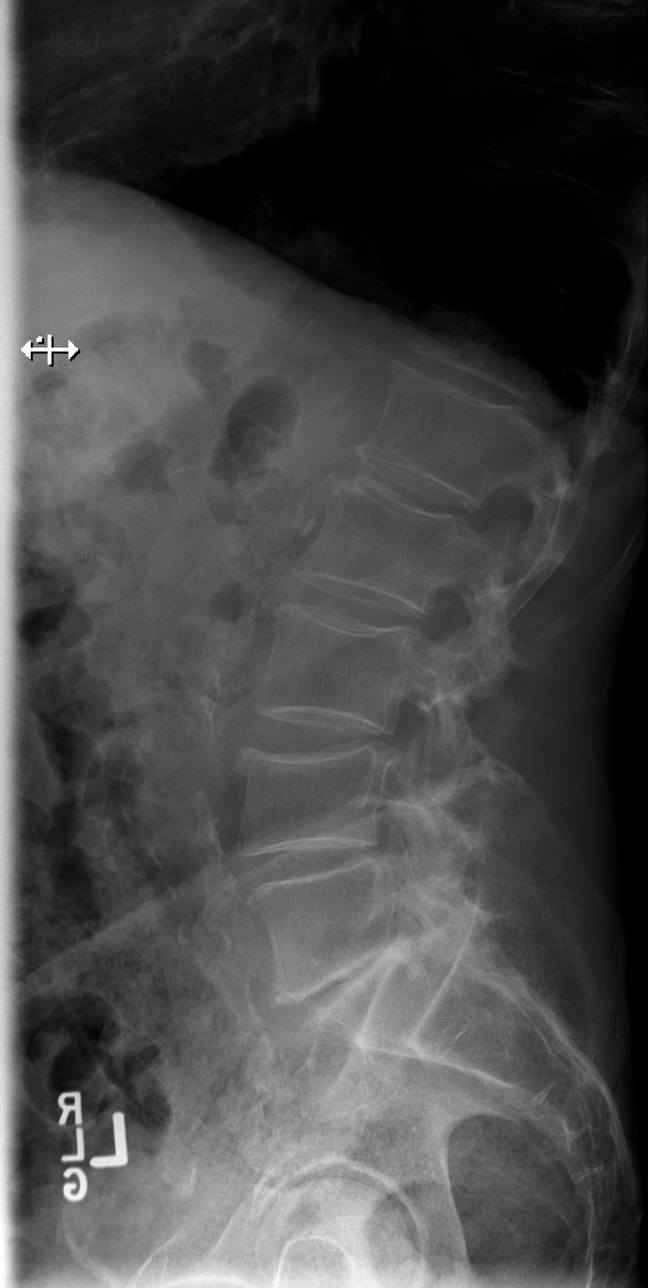

[t lumbar l-5 s-1 spot]
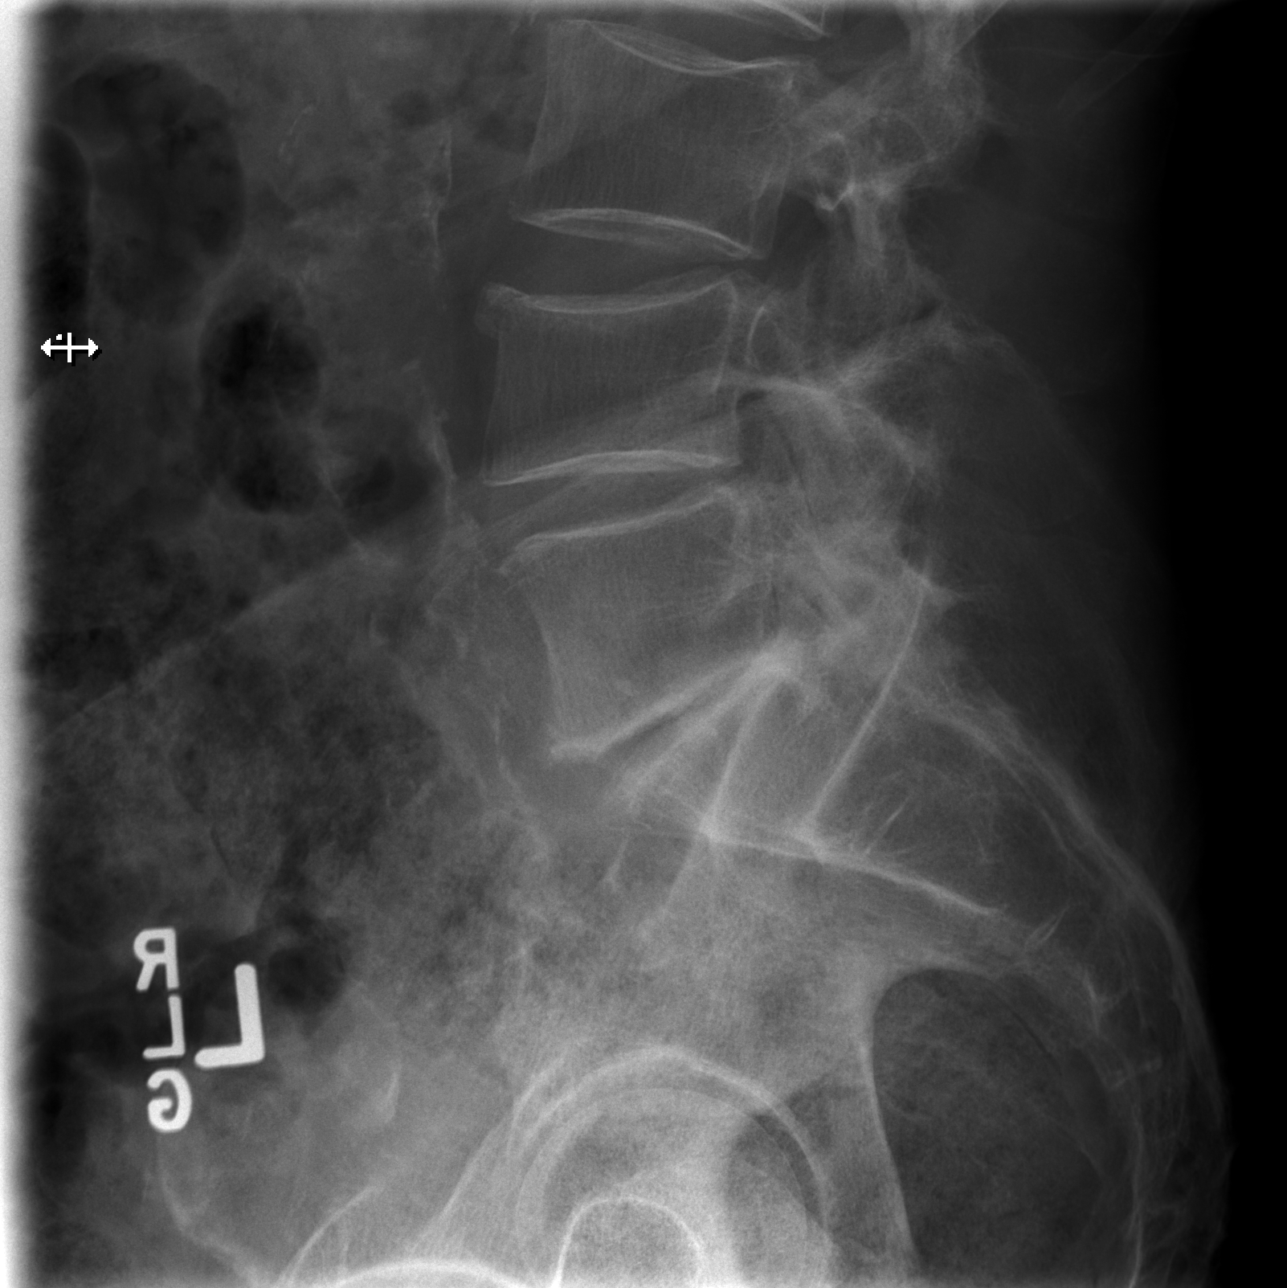

[5 of 5 positions shown; findings below may reference images not displayed]

FINDINGS: L4 superior endplate mild compression fracture with 10% loss of
height mid to anterior aspect. This is new compared to the prior CT.
No retropulsion.

L5-S1 moderate to marked disc space narrowing.

Facet degenerative changes L4-5 and L5-S1 without pars defect.

Vascular calcifications with ectatic abdominal aorta and iliac
arteries incompletely assessed on the present exam.

Gas-filled top-normal size small bowel loops.
IMPRESSION: L4 superior endplate mild compression fracture with 10% loss of
height mid to anterior aspect. This is new compared to the prior CT.
No retropulsion.

L5-S1 moderate to marked disc space narrowing.

Facet degenerative changes L4-5 and L5-S1.

Vascular calcifications with ectatic abdominal aorta and iliac
arteries incompletely assessed on the present exam.

## 2019-01-30 LAB — CLOSTRIDIUM DIFFICILE BY PCR: Toxigenic C. Difficile by PCR: NEGATIVE

## 2019-03-04 ENCOUNTER — Other Ambulatory Visit: Payer: Self-pay

## 2019-03-06 ENCOUNTER — Other Ambulatory Visit: Payer: Self-pay | Admitting: Family Medicine

## 2019-03-11 NOTE — Telephone Encounter (Signed)
Last OV 01/27/19  Last refill Lorazepam 02/28/18 #90/1                 Gabapentin 02/25/18 #90/1 Next OV not scheduled

## 2019-03-18 ENCOUNTER — Other Ambulatory Visit: Payer: Self-pay | Admitting: Family Medicine

## 2019-04-07 ENCOUNTER — Other Ambulatory Visit: Payer: Self-pay | Admitting: Family Medicine

## 2019-04-08 ENCOUNTER — Other Ambulatory Visit: Payer: Self-pay | Admitting: Family Medicine

## 2019-04-08 NOTE — Telephone Encounter (Signed)
Is pt back on full tablet or should  I send in 1/2 tab sig.?    Please advise

## 2019-04-20 ENCOUNTER — Other Ambulatory Visit: Payer: Self-pay | Admitting: Family Medicine

## 2019-04-20 DIAGNOSIS — E039 Hypothyroidism, unspecified: Secondary | ICD-10-CM

## 2019-04-22 ENCOUNTER — Telehealth: Payer: Self-pay | Admitting: *Deleted

## 2019-04-22 NOTE — Telephone Encounter (Signed)
Copied from [place] [id number]. Topic: Quick Communication - See Telephone Encounter >> Apr 22, 2019  2:22 PM Loma [place] wrote: CRM for notification. See Telephone encounter for: 04/22/19. Fu [id number] [phone] This pt is wanting to get COVID vaccine but is wanting Dr Sarajane Jews or nuse to give him a call as he has health conditions that he wants  FU to make sure Dr Sarajane Jews would approve

## 2019-04-23 NOTE — Telephone Encounter (Signed)
Yes he should get the vaccine  

## 2019-04-23 NOTE — Telephone Encounter (Signed)
Patient is aware. Nothing further needed.  

## 2019-04-27 ENCOUNTER — Other Ambulatory Visit: Payer: Self-pay

## 2019-04-27 ENCOUNTER — Telehealth: Payer: Medicare Other | Admitting: Family Medicine

## 2019-06-17 ENCOUNTER — Other Ambulatory Visit: Payer: Self-pay | Admitting: Family Medicine

## 2019-06-18 DIAGNOSIS — Z961 Presence of intraocular lens: Secondary | ICD-10-CM | POA: Diagnosis not present

## 2019-06-18 DIAGNOSIS — H353131 Nonexudative age-related macular degeneration, bilateral, early dry stage: Secondary | ICD-10-CM | POA: Diagnosis not present

## 2019-07-06 ENCOUNTER — Other Ambulatory Visit: Payer: Self-pay | Admitting: Family Medicine

## 2019-07-08 ENCOUNTER — Other Ambulatory Visit: Payer: Self-pay | Admitting: Family Medicine

## 2019-07-19 ENCOUNTER — Other Ambulatory Visit: Payer: Self-pay | Admitting: Family Medicine

## 2019-07-19 DIAGNOSIS — E039 Hypothyroidism, unspecified: Secondary | ICD-10-CM

## 2019-08-14 ENCOUNTER — Other Ambulatory Visit: Payer: Self-pay | Admitting: Family Medicine

## 2019-08-31 ENCOUNTER — Other Ambulatory Visit: Payer: Self-pay | Admitting: Family Medicine

## 2019-08-31 NOTE — Telephone Encounter (Signed)
Last filled 03/12/2019 Last OV 12/16/2018  Ok to fill?

## 2019-09-02 NOTE — Telephone Encounter (Signed)
LORazepam (ATIVAN) 1 MG tablet  Needs a 90 day supply he's 4 days early and he will be leaving town tomorrow. The pharmacy needs a verbal til fill the Rx early.  [place], [place] Phone:  [phone]  Fax:  [phone]

## 2019-09-03 MED ORDER — LORAZEPAM 1 MG PO TABS: 1.0000 mg | ORAL_TABLET | Freq: Every evening | ORAL | 0 refills | Status: DC | PRN

## 2019-09-03 NOTE — Addendum Note (Signed)
Addended by: Rebecca Eaton on: 09/03/2019 10:27 AM   Modules accepted: Orders

## 2019-09-03 NOTE — Telephone Encounter (Signed)
I advised the pharmacy that Dr. Sarajane Jews is out of the office this week and that we will have to see if another provider would be able to send in a temporary supply.   The patient needs at least 5 days worth.

## 2019-09-09 MED ORDER — LORAZEPAM 1 MG PO TABS: 1.0000 mg | ORAL_TABLET | Freq: Every evening | ORAL | 5 refills | Status: DC | PRN

## 2019-09-09 NOTE — Telephone Encounter (Signed)
Done

## 2019-09-09 NOTE — Addendum Note (Signed)
Addended by: Alysia Penna A on: 09/09/2019 01:13 PM   Modules accepted: Orders

## 2019-09-11 ENCOUNTER — Other Ambulatory Visit: Payer: Self-pay | Admitting: Family Medicine

## 2019-09-28 ENCOUNTER — Other Ambulatory Visit: Payer: Self-pay | Admitting: Family Medicine

## 2019-09-28 NOTE — Telephone Encounter (Signed)
Patient need to schedule an ov for more refills.  Called pt no answer.  

## 2019-09-30 ENCOUNTER — Ambulatory Visit (INDEPENDENT_AMBULATORY_CARE_PROVIDER_SITE_OTHER): Payer: Medicare Other | Admitting: Family Medicine

## 2019-09-30 ENCOUNTER — Other Ambulatory Visit: Payer: Self-pay

## 2019-09-30 ENCOUNTER — Encounter: Payer: Self-pay | Admitting: Family Medicine

## 2019-09-30 VITALS — BP 124/62 | HR 77 | Temp 97.3°F | Wt 165.6 lb

## 2019-09-30 DIAGNOSIS — G4700 Insomnia, unspecified: Secondary | ICD-10-CM | POA: Diagnosis not present

## 2019-09-30 DIAGNOSIS — F411 Generalized anxiety disorder: Secondary | ICD-10-CM

## 2019-09-30 DIAGNOSIS — K580 Irritable bowel syndrome with diarrhea: Secondary | ICD-10-CM

## 2019-09-30 MED ORDER — LORAZEPAM 1 MG PO TABS: 1.0000 mg | ORAL_TABLET | Freq: Every evening | ORAL | 1 refills | Status: DC | PRN

## 2019-09-30 MED ORDER — CITALOPRAM HYDROBROMIDE 10 MG PO TABS: 10.0000 mg | ORAL_TABLET | Freq: Every day | ORAL | 3 refills | Status: DC

## 2019-09-30 NOTE — Progress Notes (Signed)
   Subjective:    Patient ID: Allen Wheeler, male    DOB: [birth year], 86 y.o.   MRN: [id number]  HPI Here for refills and to ask about diarrhea. He sleeps well with Lorazepam, and his anxiety is managed by Celexa. He also describes having frequent loose (but not watery) stools and he sometimes has urgent diarrhea in the mornings that he cannot control. He has been taking Metamucil daily and this has helped somewhat. There is no pain.   Review of Systems  Constitutional: Negative.   Respiratory: Negative.   Cardiovascular: Negative.   Gastrointestinal: Positive for diarrhea. Negative for abdominal distention, abdominal pain, anal bleeding, blood in stool, constipation, nausea, rectal pain and vomiting.  Neurological: Negative.   Psychiatric/Behavioral: Positive for sleep disturbance. Negative for confusion, dysphoric mood and hallucinations. The patient is nervous/anxious.        Objective:   Physical Exam Constitutional:      Appearance: Normal appearance.  Cardiovascular:     Rate and Rhythm: Normal rate and regular rhythm.     Pulses: Normal pulses.     Heart sounds: Normal heart sounds.  Pulmonary:     Effort: Pulmonary effort is normal.     Breath sounds: Normal breath sounds.  Abdominal:     General: Abdomen is flat. Bowel sounds are normal. There is no distension.     Palpations: Abdomen is soft. There is no mass.     Tenderness: There is no abdominal tenderness. There is no guarding or rebound.     Hernia: No hernia is present.  Neurological:     General: No focal deficit present.     Mental Status: He is alert and oriented to person, place, and time.           Assessment & Plan:  His anxiety and insomnia are stable, so we refilled his medications. He has IBS and I suggested he limit his intake of bulky foods like greens and apples and of caffeine./ he can use Imodium in the mornings if he plans to go out that day. Alysia Penna, MD

## 2019-10-12 ENCOUNTER — Other Ambulatory Visit: Payer: Self-pay | Admitting: Family Medicine

## 2019-10-17 ENCOUNTER — Other Ambulatory Visit: Payer: Self-pay | Admitting: Family Medicine

## 2019-10-17 DIAGNOSIS — E039 Hypothyroidism, unspecified: Secondary | ICD-10-CM

## 2019-12-08 ENCOUNTER — Other Ambulatory Visit: Payer: Self-pay | Admitting: Family Medicine

## 2019-12-29 ENCOUNTER — Other Ambulatory Visit: Payer: Self-pay | Admitting: Family Medicine

## 2019-12-30 ENCOUNTER — Telehealth: Payer: Self-pay | Admitting: Family Medicine

## 2019-12-30 NOTE — Telephone Encounter (Signed)
Pt is wanting to know if he should get the COVID booster shot. Had his sets done in January.  Pt is going to [place] 10/15 and wants to make sure if he needs it he gets it

## 2020-01-04 ENCOUNTER — Other Ambulatory Visit: Payer: Self-pay | Admitting: Family Medicine

## 2020-01-04 NOTE — Telephone Encounter (Signed)
Yes I recommend he get the booster shot

## 2020-01-06 ENCOUNTER — Other Ambulatory Visit: Payer: Self-pay

## 2020-01-06 ENCOUNTER — Encounter: Payer: Self-pay | Admitting: Family Medicine

## 2020-01-06 ENCOUNTER — Ambulatory Visit (INDEPENDENT_AMBULATORY_CARE_PROVIDER_SITE_OTHER): Payer: Medicare Other | Admitting: Family Medicine

## 2020-01-06 VITALS — BP 130/80 | HR 71 | Temp 97.8°F | Ht 66.0 in | Wt 166.8 lb

## 2020-01-06 DIAGNOSIS — M545 Low back pain, unspecified: Secondary | ICD-10-CM

## 2020-01-06 DIAGNOSIS — K589 Irritable bowel syndrome without diarrhea: Secondary | ICD-10-CM | POA: Insufficient documentation

## 2020-01-06 DIAGNOSIS — M26651 Arthropathy of right temporomandibular joint: Secondary | ICD-10-CM

## 2020-01-06 DIAGNOSIS — K580 Irritable bowel syndrome with diarrhea: Secondary | ICD-10-CM

## 2020-01-06 DIAGNOSIS — M26659 Arthropathy of unspecified temporomandibular joint: Secondary | ICD-10-CM | POA: Insufficient documentation

## 2020-01-06 MED ORDER — DICYCLOMINE HCL 20 MG PO TABS: 20.0000 mg | ORAL_TABLET | Freq: Every morning | ORAL | 5 refills | Status: DC

## 2020-01-06 NOTE — Progress Notes (Signed)
° °  Subjective:    Patient ID: Allen Wheeler, male    DOB: [birth year], 86 y.o.   MRN: [id number]  HPI Here for several issues. First he continues to struggle with diarrhea and urgent stools. Often shortly after eating breakfast he will have an urgent loose stool, and he may have an accident in his pants before he gets to a bathroom. He has been using Metamucil daily and this has helped firm up the stools somewhat. He has more control control over his BM's the rest of the day. Second he has had pain in the right jaw area for 2 months. It hurts to open his mouth wide and sometimes his jaw gets stuck in the open position. Third he has chronic stiffness and pain in the lower back and right hip areas. He takes Ibuprofen as needed.    Review of Systems  Constitutional: Negative.   HENT: Positive for dental problem. Negative for trouble swallowing.   Respiratory: Negative.   Cardiovascular: Negative.   Gastrointestinal: Positive for diarrhea.  Musculoskeletal: Positive for back pain.       Objective:   Physical Exam Constitutional:      General: He is not in acute distress.    Appearance: Normal appearance.  HENT:     Head: Normocephalic and atraumatic.     Right Ear: Tympanic membrane, ear canal and external ear normal.     Left Ear: Tympanic membrane, ear canal and external ear normal.     Nose: Nose normal.     Mouth/Throat:     Pharynx: Oropharynx is clear.     Comments: He is tender over the right TMJ and this has crepitus  Cardiovascular:     Rate and Rhythm: Normal rate and regular rhythm.     Pulses: Normal pulses.     Heart sounds: Normal heart sounds.  Pulmonary:     Effort: Pulmonary effort is normal.     Breath sounds: Normal breath sounds.  Abdominal:     General: Abdomen is flat. Bowel sounds are normal. There is no distension.     Palpations: Abdomen is soft. There is no mass.     Tenderness: There is no abdominal tenderness. There is no guarding or rebound.      Hernia: No hernia is present.  Musculoskeletal:     Comments: He is tender in the right lower back area with reduced ROM. Negative SLR   Lymphadenopathy:     Cervical: No cervical adenopathy.  Neurological:     Mental Status: He is alert.           Assessment & Plan:  He has IBS with diarrhea. He will try taking a Dicyclomine 20 mg tablet every morning before breakfast. He has TMJ pain and I suggested he talk to his dentist about this. He also has low back pain likely from OA, and I suggested he see his chiropractor for that.  Alysia Penna, MD

## 2020-01-15 ENCOUNTER — Other Ambulatory Visit: Payer: Self-pay | Admitting: Family Medicine

## 2020-01-15 DIAGNOSIS — E039 Hypothyroidism, unspecified: Secondary | ICD-10-CM

## 2020-01-15 NOTE — Telephone Encounter (Signed)
The TSH that was ordered was still not done.   I can call pt to come in for that lab.   Please advise on refill for levothyroxin.

## 2020-02-22 ENCOUNTER — Other Ambulatory Visit: Payer: Self-pay | Admitting: *Deleted

## 2020-02-22 ENCOUNTER — Telehealth: Payer: Self-pay | Admitting: Family Medicine

## 2020-02-22 DIAGNOSIS — H9211 Otorrhea, right ear: Secondary | ICD-10-CM | POA: Diagnosis not present

## 2020-02-22 DIAGNOSIS — H7291 Unspecified perforation of tympanic membrane, right ear: Secondary | ICD-10-CM | POA: Diagnosis not present

## 2020-02-22 DIAGNOSIS — H6122 Impacted cerumen, left ear: Secondary | ICD-10-CM | POA: Diagnosis not present

## 2020-02-22 DIAGNOSIS — Z974 Presence of external hearing-aid: Secondary | ICD-10-CM | POA: Diagnosis not present

## 2020-02-22 DIAGNOSIS — H906 Mixed conductive and sensorineural hearing loss, bilateral: Secondary | ICD-10-CM | POA: Diagnosis not present

## 2020-02-22 MED ORDER — TAMSULOSIN HCL 0.4 MG PO CAPS: ORAL_CAPSULE | ORAL | 0 refills | Status: DC

## 2020-02-22 NOTE — Telephone Encounter (Signed)
Brooke at the pharmacy is calling to get clarification on the Rx tamsulosin (FLOMAX) 0.4 MG pt is stating that he takes 2 a day and the Rx was sent in for 1 a day so the pharmacy would like to have a call back with clarification.

## 2020-02-23 MED ORDER — TAMSULOSIN HCL 0.4 MG PO CAPS: ORAL_CAPSULE | ORAL | 0 refills | Status: DC

## 2020-02-23 NOTE — Telephone Encounter (Signed)
Per provider: patient was advised to take 2 caps daily.  New RX sent to the pharmacy and called to inform them on the phone.  Dm/cma

## 2020-02-23 NOTE — Addendum Note (Signed)
Addended by: Armandina Gemma L on: 02/23/2020 11:45 AM   Modules accepted: Orders

## 2020-03-07 ENCOUNTER — Other Ambulatory Visit: Payer: Self-pay | Admitting: Family Medicine

## 2020-03-09 ENCOUNTER — Other Ambulatory Visit: Payer: Self-pay | Admitting: Family Medicine

## 2020-03-09 NOTE — Telephone Encounter (Signed)
Refill request for: Mirapex 0.125 mg LR 12/08/19, #90, 0 rf LOV 01/06/20 FOV none scheduled  Please review and advise.  Thanks.  Dm/cma

## 2020-03-31 ENCOUNTER — Other Ambulatory Visit: Payer: Self-pay | Admitting: Family Medicine

## 2020-04-05 DIAGNOSIS — M25511 Pain in right shoulder: Secondary | ICD-10-CM | POA: Diagnosis not present

## 2020-04-11 ENCOUNTER — Other Ambulatory Visit: Payer: Self-pay | Admitting: Family Medicine

## 2020-04-13 NOTE — Telephone Encounter (Signed)
Last office visit- 01-06-20 Last refill- 01/04/20- 90 tabs with no refills Next appointment- not scheduled.   Medicare wellness visit- 04/27/2020

## 2020-04-27 ENCOUNTER — Ambulatory Visit (INDEPENDENT_AMBULATORY_CARE_PROVIDER_SITE_OTHER): Payer: Medicare Other

## 2020-04-27 ENCOUNTER — Other Ambulatory Visit: Payer: Self-pay

## 2020-04-27 VITALS — BP 150/80 | HR 75 | Temp 97.6°F | Ht 66.0 in | Wt 164.5 lb

## 2020-04-27 DIAGNOSIS — Z0000 Encounter for general adult medical examination without abnormal findings: Secondary | ICD-10-CM | POA: Diagnosis not present

## 2020-04-27 NOTE — Progress Notes (Signed)
Subjective:   Allen Wheeler is a 87 y.o. male who presents for an Initial Medicare Annual Wellness Visit.  Review of Systems    N/A  Cardiac Risk Factors include: advanced age (>75men, >88 women);male gender     Objective:    Today's Vitals   04/27/20 1128  BP: (!) 150/80  Pulse: 75  Temp: 97.6 F (36.4 C)  TempSrc: Oral  SpO2: 95%  Weight: 164 lb 8 oz (74.6 kg)  Height: 5\' 6"  (1.676 m)   Body mass index is 26.55 kg/m.  Advanced Directives 04/27/2020 07/16/2016 06/15/2016 02/14/2016 12/22/2013  Does Patient Have a Medical Advance Directive? Yes No Yes Yes No  Type of Paramedic of [place];Living will - [place];Living will Living will -  Does patient want to make changes to medical advance directive? No - Patient declined - No - Patient declined - -  Copy of [place] in Chart? No - copy requested - - - -    Current Medications (verified) Outpatient Encounter Medications as of 04/27/2020  Medication Sig  . aspirin 81 MG chewable tablet Chew by mouth daily.  . citalopram (CELEXA) 10 MG tablet Take 1 tablet (10 mg total) by mouth daily.  . cyclobenzaprine (FLEXERIL) 5 MG tablet Take 5 mg by mouth at bedtime as needed.  . diclofenac (VOLTAREN) 50 MG EC tablet Take by mouth.  . dicyclomine (BENTYL) 20 MG tablet Take 1 tablet (20 mg total) by mouth in the morning.  . gabapentin (NEURONTIN) 100 MG capsule TAKE 1 CAPSULE AT BEDTIME.  . hydrocortisone 2.5 % cream Apply topically 2 (two) times daily.  Marland Kitchen levothyroxine (SYNTHROID) 50 MCG tablet TAKE 1 TABLET ONCE DAILY.  Marland Kitchen LORazepam (ATIVAN) 1 MG tablet TAKE 1 TABLET AT BEDTIME AS NEEDED.  . Multiple Vitamin (MULTIVITAMIN WITH MINERALS) TABS tablet Take 1 tablet by mouth daily. Centrum Silver  . omeprazole (PRILOSEC) 40 MG capsule TAKE 1 CAPSULE DAILY.  Marland Kitchen pramipexole (MIRAPEX) 0.125 MG tablet TAKE ONE TABLET AT BEDTIME.  . predniSONE (DELTASONE) 10 MG tablet TAKE 1  TABLET ONCE DAILY.  . tamsulosin (FLOMAX) 0.4 MG CAPS capsule TAKE (2) CAPSULE DAILY.  Marland Kitchen Vitamins/Minerals TABS Take by mouth.  . [DISCONTINUED] hydrocortisone 2.5 % cream Apply topically.  Marland Kitchen albuterol (PROVENTIL HFA;VENTOLIN HFA) 108 (90 Base) MCG/ACT inhaler Inhale 2 puffs into the lungs every 4 (four) hours as needed for wheezing or shortness of breath. (Patient not taking: Reported on 04/27/2020)  . azelastine (ASTELIN) 0.1 % nasal spray USE 2 SPRAYS EACH NOSTRIL TWICE A DAY. (Patient not taking: Reported on 04/27/2020)  . [DISCONTINUED] STUDY - ASPIRE - aspirin 81 mg or placebo tablet (PI-Sethi) Take by mouth.   No facility-administered encounter medications on file as of 04/27/2020.    Allergies (verified) Patient has no known allergies.   History: Past Medical History:  Diagnosis Date  . Allergy   . Anxiety   . Chickenpox   . Depression   . Dizziness, nonspecific    loss of balance last few months  . GERD (gastroesophageal reflux disease)   . Hearing loss    wears hearing aids  . Nephrolithiasis   . Normal cardiac stress test 09-20-12  . Osteoarthritis    Past Surgical History:  Procedure Laterality Date  . CATARACT EXTRACTION  2017  . CHOLECYSTECTOMY N/A 02/18/2016   Procedure: LAPAROSCOPIC CHOLECYSTECTOMY WITH POSSIBLE INTRAOPERATIVE CHOLANGIOGRAM;  Surgeon: Mickeal Skinner, MD;  Location: [facility];  Service: General;  Laterality: N/A;  . ERCP N/A 02/16/2016   Procedure: ENDOSCOPIC RETROGRADE CHOLANGIOPANCREATOGRAPHY (ERCP);  Surgeon: Teena Irani, MD;  Location: [facility] ENDOSCOPY;  Service: Endoscopy;  Laterality: N/A;  . HERNIA REPAIR     wears truss  . INGUINAL HERNIA REPAIR Right 06/21/2016   Procedure: OPEN RIGHT INGUINAL HERNIA REPAIR;  Surgeon: [name] Kinsinger, MD;  Location: [facility];  Service: General;  Laterality: Right;  . INSERTION OF MESH Right 06/21/2016   Procedure: INSERTION OF MESH;  Surgeon: [name] Kinsinger, MD;  Location: [facility];  Service: General;   Laterality: Right;  . MASTOIDECTOMY     Family History  Problem Relation Age of Onset  . Heart disease Mother        Pacemaker  . Colon cancer Father    Social History   Socioeconomic History  . Marital status: Widowed    Spouse name: Not on file  . Number of children: 1  . Years of education: Not on file  . Highest education level: Not on file  Occupational History  . Occupation: Retired    Fish farm manager: [employer]  Tobacco Use  . Smoking status: Former Research scientist (life sciences)  . Smokeless tobacco: Never Used  Substance and Sexual Activity  . Alcohol use: Yes    Alcohol/week: 18.0 standard drinks    Types: 14 Shots of liquor, 4 Glasses of wine per week  . Drug use: No  . Sexual activity: Not on file  Other Topics Concern  . Not on file  Social History Narrative   Lives at home alone, widowed   Right-handed   Caffeine: rare   Social Determinants of Health   Financial Resource Strain: Low Risk   . Difficulty of Paying Living Expenses: Not hard at all  Food Insecurity: No Food Insecurity  . Worried About Charity fundraiser in the Last Year: Never true  . Ran Out of Food in the Last Year: Never true  Transportation Needs: No Transportation Needs  . Lack of Transportation (Medical): No  . Lack of Transportation (Non-Medical): No  Physical Activity: Inactive  . Days of Exercise per Week: 0 days  . Minutes of Exercise per Session: 0 min  Stress: No Stress Concern Present  . Feeling of Stress : Not at all  Social Connections: Socially Isolated  . Frequency of Communication with Friends and Family: More than three times a week  . Frequency of Social Gatherings with Friends and Family: More than three times a week  . Attends Religious Services: Never  . Active Member of Clubs or Organizations: No  . Attends Archivist Meetings: Never  . Marital Status: Widowed    Tobacco Counseling Counseling given: Not Answered   Clinical Intake:  Pre-visit preparation completed:  Yes  Pain : No/denies pain     Nutritional Risks: Nausea/ vomitting/ diarrhea (diarrhea due to IBS) Diabetes: No  How often do you need to have someone help you when you read instructions, pamphlets, or other written materials from your doctor or pharmacy?: 1 - Never What is the last grade level you completed in school?: Doctorate  Diabetic?No   Interpreter Needed?: No  Information entered by :: Secretary of Daily Living In your present state of health, do you have any difficulty performing the following activities: 04/27/2020  Hearing? Y  Vision? N  Difficulty concentrating or making decisions? N  Walking or climbing stairs? N  Dressing or bathing? N  Doing errands, shopping? N  Preparing Food and eating ?  N  Using the Toilet? N  In the past six months, have you accidently leaked urine? N  Do you have problems with loss of bowel control? Y  Comment occassional bowel leakage with IBS  Managing your Medications? N  Managing your Finances? N  Housekeeping or managing your Housekeeping? N  Some recent data might be hidden    Patient Care Team: [name], MD as PCP - General  Indicate any recent Medical Services you may have received from other than [facility] providers in the past year (date may be approximate).     Assessment:   This is a routine wellness examination for [place].  Hearing/Vision screen  Hearing Screening   125Hz  250Hz  500Hz  1000Hz  2000Hz  3000Hz  4000Hz  6000Hz  8000Hz   Right ear:           Left ear:           Vision Screening Comments: Patient states gets eyes examined once per year. Has hx of cataract surgery. Wears reading glasses only   Dietary issues and exercise activities discussed: Current Exercise Habits: The patient does not participate in regular exercise at present  Goals    . Exercise 3x per week (30 min per time)      Depression Screen PHQ 2/9 Scores 04/27/2020 09/16/2014  PHQ - 2 Score 0 0  PHQ- 9 Score 0 -    Fall  Risk Fall Risk  04/27/2020 03/04/2019 11/07/2018 12/09/2015 09/16/2014  Falls in the past year? 0 0 (No Data) No No  Comment - [facility] Telephone Survey: data to providers prior to load [employer] Telephone Survey: data to providers prior to load [facility] Telephone Survey: data to providers prior to load -  Number falls in past yr: 0 - (No Data) - -  Comment - - [facility] Telephone Survey Actual Response =  - -  Injury with Fall? 0 - - - -  Risk for fall due to : Impaired balance/gait - - - -  Follow up Falls evaluation completed;Falls prevention discussed - - - -    FALL RISK PREVENTION PERTAINING TO THE HOME:  Any stairs in or around the home? Yes  If so, are there any without handrails? No  Home free of loose throw rugs in walkways, pet beds, electrical cords, etc? Yes  Adequate lighting in your home to reduce risk of falls? Yes   ASSISTIVE DEVICES UTILIZED TO PREVENT FALLS:  Life alert? Yes  Use of a cane, walker or w/c? No  Grab bars in the bathroom? Yes  Shower chair or bench in shower? Yes  Elevated toilet seat or a handicapped toilet? Yes   TIMED UP AND GO:  Was the test performed? Yes .  Length of time to ambulate 10 feet: 5 sec.   Gait steady and fast without use of assistive device  Cognitive Function:   Normal cognitive status assessed by direct observation by this Nurse Health Advisor. No abnormalities found.        Immunizations Immunization History  Administered Date(s) Administered  . Fluad Quad(high Dose 65+) 12/16/2018  . Influenza Split 02/22/2011, 12/25/2012  . Influenza Whole 01/05/2009  . Influenza, High Dose Seasonal PF 12/28/2014, 12/13/2015, 01/08/2017  . Influenza,inj,Quad PF,6+ Mos 03/10/2014  . Moderna Sars-Covid-2 Vaccination 05/01/2019, 05/29/2019, 01/01/2020  . Pneumococcal Conjugate-13 05/29/2016  . Pneumococcal Polysaccharide-23 12/16/2018  . Zoster 06/01/2008    TDAP status: Due, Education has been provided regarding the importance of this vaccine.  Advised may receive this vaccine at local pharmacy or  Health Dept. Aware to provide a copy of the vaccination record if obtained from local pharmacy or Health Dept. Verbalized acceptance and understanding.  Flu Vaccine status: Up to date  Pneumococcal vaccine status: Up to date  Covid-19 vaccine status: Completed vaccines  Qualifies for Shingles Vaccine? Yes   Zostavax completed Yes   Shingrix Completed?: No.    Education has been provided regarding the importance of this vaccine. Patient has been advised to call insurance company to determine out of pocket expense if they have not yet received this vaccine. Advised may also receive vaccine at local pharmacy or Health Dept. Verbalized acceptance and understanding.  Screening Tests Health Maintenance  Topic Date Due  . TETANUS/TDAP  Never done  . INFLUENZA VACCINE  11/08/2019  . COVID-19 Vaccine  Completed  . PNA vac Low Risk Adult  Completed    Health Maintenance  Health Maintenance Due  Topic Date Due  . TETANUS/TDAP  Never done  . INFLUENZA VACCINE  11/08/2019    Colorectal cancer screening: No longer required.   Lung Cancer Screening: (Low Dose CT Chest recommended if Age 36-80 years, 30 pack-year currently smoking OR have quit w/in 15years.) does not qualify.   Lung Cancer Screening Referral: N/A   Additional Screening:  Hepatitis C Screening: does not qualify;   Vision Screening: Recommended annual ophthalmology exams for early detection of glaucoma and other disorders of the eye. Is the patient up to date with their annual eye exam?  Yes  Who is the provider or what is the name of the office in which the patient attends annual eye exams? Dr. Gershon Crane  If pt is not established with a provider, would they like to be referred to a provider to establish care? No .   Dental Screening: Recommended annual dental exams for proper oral hygiene  Community Resource Referral / Chronic Care Management: CRR required this visit?   No   CCM required this visit?  No      Plan:     I have personally reviewed and noted the following in the patient's chart:   . Medical and social history . Use of alcohol, tobacco or illicit drugs  . Current medications and supplements . Functional ability and status . Nutritional status . Physical activity . Advanced directives . List of other physicians . Hospitalizations, surgeries, and ER visits in previous 12 months . Vitals . Screenings to include cognitive, depression, and falls . Referrals and appointments  In addition, I have reviewed and discussed with patient certain preventive protocols, quality metrics, and best practice recommendations. A written personalized care plan for preventive services as well as general preventive health recommendations were provided to patient.     Ofilia Neas, LPN   8/34/1960   Nurse Notes: None

## 2020-04-27 NOTE — Patient Instructions (Signed)
Allen Wheeler , Thank you for taking time to come for your Medicare Wellness Visit. I appreciate your ongoing commitment to your health goals. Please review the following plan we discussed and let me know if I can assist you in the future.   Screening recommendations/referrals: Colonoscopy: No longer required  Recommended yearly ophthalmology/optometry visit for glaucoma screening and checkup Recommended yearly dental visit for hygiene and checkup  Vaccinations: Influenza vaccine: Up to date, next due fall 2022  Pneumococcal vaccine: Completed series  Tdap vaccine: Currently due, if you wish to receive you may contact your insurance company to discuss any out of pocket cost or you may await and injury to receive  Shingles vaccine: Currently due for Shingrix, if you wish to receive we recommend that you do so at your local pharmacy as it will be less expensive     Advanced directives: Please bring in copies of your advanced medical directives so that we may scan them into your chart.   Conditions/risks identified: None   Next appointment: 04/29/2020 @ 11:00 am to see Dr. Sarajane Jews   Preventive Care 96 Years and Older, Male Preventive care refers to lifestyle choices and visits with your health care provider that can promote health and wellness. What does preventive care include?  A yearly physical exam. This is also called an annual well check.  Dental exams once or twice a year.  Routine eye exams. Ask your health care provider how often you should have your eyes checked.  Personal lifestyle choices, including:  Daily care of your teeth and gums.  Regular physical activity.  Eating a healthy diet.  Avoiding tobacco and drug use.  Limiting alcohol use.  Practicing safe sex.  Taking low doses of aspirin every day.  Taking vitamin and mineral supplements as recommended by your health care provider. What happens during an annual well check? The services and screenings done by your  health care provider during your annual well check will depend on your age, overall health, lifestyle risk factors, and family history of disease. Counseling  Your health care provider may ask you questions about your:  Alcohol use.  Tobacco use.  Drug use.  Emotional well-being.  Home and relationship well-being.  Sexual activity.  Eating habits.  History of falls.  Memory and ability to understand (cognition).  Work and work Statistician. Screening  You may have the following tests or measurements:  Height, weight, and BMI.  Blood pressure.  Lipid and cholesterol levels. These may be checked every 5 years, or more frequently if you are over 52 years old.  Skin check.  Lung cancer screening. You may have this screening every year starting at age 38 if you have a 30-pack-year history of smoking and currently smoke or have quit within the past 15 years.  Fecal occult blood test (FOBT) of the stool. You may have this test every year starting at age 51.  Flexible sigmoidoscopy or colonoscopy. You may have a sigmoidoscopy every 5 years or a colonoscopy every 10 years starting at age 61.  Prostate cancer screening. Recommendations will vary depending on your family history and other risks.  Hepatitis C blood test.  Hepatitis B blood test.  Sexually transmitted disease (STD) testing.  Diabetes screening. This is done by checking your blood sugar (glucose) after you have not eaten for a while (fasting). You may have this done every 1-3 years.  Abdominal aortic aneurysm (AAA) screening. You may need this if you are a current or former smoker.  Osteoporosis. You may be screened starting at age 40 if you are at high risk. Talk with your health care provider about your test results, treatment options, and if necessary, the need for more tests. Vaccines  Your health care provider may recommend certain vaccines, such as:  Influenza vaccine. This is recommended every  year.  Tetanus, diphtheria, and acellular pertussis (Tdap, Td) vaccine. You may need a Td booster every 10 years.  Zoster vaccine. You may need this after age 41.  Pneumococcal 13-valent conjugate (PCV13) vaccine. One dose is recommended after age 68.  Pneumococcal polysaccharide (PPSV23) vaccine. One dose is recommended after age 75. Talk to your health care provider about which screenings and vaccines you need and how often you need them. This information is not intended to replace advice given to you by your health care provider. Make sure you discuss any questions you have with your health care provider. Document Released: 04/22/2015 Document Revised: 12/14/2015 Document Reviewed: 01/25/2015 Elsevier Interactive Patient Education  2017 [place] Prevention in the Home Falls can cause injuries. They can happen to people of all ages. There are many things you can do to make your home safe and to help prevent falls. What can I do on the outside of my home?  Regularly fix the edges of walkways and driveways and fix any cracks.  Remove anything that might make you trip as you walk through a door, such as a raised step or threshold.  Trim any bushes or trees on the path to your home.  Use bright outdoor lighting.  Clear any walking paths of anything that might make someone trip, such as rocks or tools.  Regularly check to see if handrails are loose or broken. Make sure that both sides of any steps have handrails.  Any raised decks and porches should have guardrails on the edges.  Have any leaves, snow, or ice cleared regularly.  Use sand or salt on walking paths during winter.  Clean up any spills in your garage right away. This includes oil or grease spills. What can I do in the bathroom?  Use night lights.  Install grab bars by the toilet and in the tub and shower. Do not use towel bars as grab bars.  Use non-skid mats or decals in the tub or shower.  If you  need to sit down in the shower, use a plastic, non-slip stool.  Keep the floor dry. Clean up any water that spills on the floor as soon as it happens.  Remove soap buildup in the tub or shower regularly.  Attach bath mats securely with double-sided non-slip rug tape.  Do not have throw rugs and other things on the floor that can make you trip. What can I do in the bedroom?  Use night lights.  Make sure that you have a light by your bed that is easy to reach.  Do not use any sheets or blankets that are too big for your bed. They should not hang down onto the floor.  Have a firm chair that has side arms. You can use this for support while you get dressed.  Do not have throw rugs and other things on the floor that can make you trip. What can I do in the kitchen?  Clean up any spills right away.  Avoid walking on wet floors.  Keep items that you use a lot in easy-to-reach places.  If you need to reach something above you, use a strong step  stool that has a grab bar.  Keep electrical cords out of the way.  Do not use floor polish or wax that makes floors slippery. If you must use wax, use non-skid floor wax.  Do not have throw rugs and other things on the floor that can make you trip. What can I do with my stairs?  Do not leave any items on the stairs.  Make sure that there are handrails on both sides of the stairs and use them. Fix handrails that are broken or loose. Make sure that handrails are as long as the stairways.  Check any carpeting to make sure that it is firmly attached to the stairs. Fix any carpet that is loose or worn.  Avoid having throw rugs at the top or bottom of the stairs. If you do have throw rugs, attach them to the floor with carpet tape.  Make sure that you have a light switch at the top of the stairs and the bottom of the stairs. If you do not have them, ask someone to add them for you. What else can I do to help prevent falls?  Wear shoes  that:  Do not have high heels.  Have rubber bottoms.  Are comfortable and fit you well.  Are closed at the toe. Do not wear sandals.  If you use a stepladder:  Make sure that it is fully opened. Do not climb a closed stepladder.  Make sure that both sides of the stepladder are locked into place.  Ask someone to hold it for you, if possible.  Clearly mark and make sure that you can see:  Any grab bars or handrails.  First and last steps.  Where the edge of each step is.  Use tools that help you move around (mobility aids) if they are needed. These include:  Canes.  Walkers.  Scooters.  Crutches.  Turn on the lights when you go into a dark area. Replace any light bulbs as soon as they burn out.  Set up your furniture so you have a clear path. Avoid moving your furniture around.  If any of your floors are uneven, fix them.  If there are any pets around you, be aware of where they are.  Review your medicines with your doctor. Some medicines can make you feel dizzy. This can increase your chance of falling. Ask your doctor what other things that you can do to help prevent falls. This information is not intended to replace advice given to you by your health care provider. Make sure you discuss any questions you have with your health care provider. Document Released: 01/20/2009 Document Revised: 09/01/2015 Document Reviewed: 04/30/2014 Elsevier Interactive Patient Education  2017 [employer].

## 2020-04-29 ENCOUNTER — Other Ambulatory Visit: Payer: Self-pay

## 2020-04-29 ENCOUNTER — Encounter: Payer: Self-pay | Admitting: Family Medicine

## 2020-04-29 ENCOUNTER — Ambulatory Visit (INDEPENDENT_AMBULATORY_CARE_PROVIDER_SITE_OTHER): Payer: Medicare Other | Admitting: Family Medicine

## 2020-04-29 VITALS — BP 148/70 | HR 73 | Ht 66.0 in | Wt 164.6 lb

## 2020-04-29 DIAGNOSIS — G4762 Sleep related leg cramps: Secondary | ICD-10-CM | POA: Diagnosis not present

## 2020-04-29 DIAGNOSIS — G629 Polyneuropathy, unspecified: Secondary | ICD-10-CM

## 2020-04-29 DIAGNOSIS — G2581 Restless legs syndrome: Secondary | ICD-10-CM | POA: Diagnosis not present

## 2020-04-29 NOTE — Progress Notes (Signed)
° °  Subjective:    Patient ID: Allen Wheeler, male    DOB: [birth year], 87 y.o.   MRN: [id number]  HPI Here with his son for several issues. He has been having trouble sleeping at night, and this is due to several problems all at once. He has leg and foot cramps at night, and he has been using a topical magnesium oil that helps somewhat. Also his restless legs have been worse. He takes Pramipexole for this. Finally his neuropathy has been worse, causing numbness or tingling in the feet. He takes Gabapentin for this.    Review of Systems  Constitutional: Negative.   Respiratory: Negative.   Cardiovascular: Negative.   Neurological: Positive for numbness.  Psychiatric/Behavioral: Positive for sleep disturbance.       Objective:   Physical Exam Constitutional:      Appearance: Normal appearance.  Cardiovascular:     Rate and Rhythm: Normal rate and regular rhythm.     Pulses: Normal pulses.     Heart sounds: Normal heart sounds.  Pulmonary:     Effort: Pulmonary effort is normal.     Breath sounds: Normal breath sounds.  Neurological:     Mental Status: He is alert.           Assessment & Plan:  For the leg cramps. He will try magnesium orally, to take 400-800 mg at bedtime. For the restless legs, he will take 2 tabs (total of 0.25 mg) at bedtime. For the neuropathy, he will take 2 capsules (total of 200 mg) at bedtime. Report back in 2 weeks.  Alysia Penna, MD

## 2020-05-02 ENCOUNTER — Ambulatory Visit (INDEPENDENT_AMBULATORY_CARE_PROVIDER_SITE_OTHER): Payer: Medicare Other | Admitting: Family Medicine

## 2020-05-02 ENCOUNTER — Encounter: Payer: Self-pay | Admitting: Family Medicine

## 2020-05-02 ENCOUNTER — Other Ambulatory Visit: Payer: Self-pay

## 2020-05-02 VITALS — BP 125/82 | HR 71 | Temp 97.7°F | Resp 18 | Ht 66.0 in | Wt 164.4 lb

## 2020-05-02 DIAGNOSIS — G2581 Restless legs syndrome: Secondary | ICD-10-CM | POA: Diagnosis not present

## 2020-05-02 DIAGNOSIS — F411 Generalized anxiety disorder: Secondary | ICD-10-CM | POA: Diagnosis not present

## 2020-05-02 DIAGNOSIS — K219 Gastro-esophageal reflux disease without esophagitis: Secondary | ICD-10-CM

## 2020-05-02 DIAGNOSIS — G629 Polyneuropathy, unspecified: Secondary | ICD-10-CM

## 2020-05-02 DIAGNOSIS — G4762 Sleep related leg cramps: Secondary | ICD-10-CM | POA: Diagnosis not present

## 2020-05-02 DIAGNOSIS — Z9109 Other allergy status, other than to drugs and biological substances: Secondary | ICD-10-CM | POA: Diagnosis not present

## 2020-05-02 DIAGNOSIS — N138 Other obstructive and reflux uropathy: Secondary | ICD-10-CM

## 2020-05-02 DIAGNOSIS — R030 Elevated blood-pressure reading, without diagnosis of hypertension: Secondary | ICD-10-CM | POA: Diagnosis not present

## 2020-05-02 DIAGNOSIS — E039 Hypothyroidism, unspecified: Secondary | ICD-10-CM | POA: Diagnosis not present

## 2020-05-02 DIAGNOSIS — N401 Enlarged prostate with lower urinary tract symptoms: Secondary | ICD-10-CM | POA: Diagnosis not present

## 2020-05-02 LAB — LIPID PANEL
Cholesterol: 230 mg/dL — ABNORMAL HIGH (ref 0–200)
HDL: 53.8 mg/dL (ref >39.00)
NonHDL: 176.58
Total CHOL/HDL Ratio: 4
Triglycerides: 239 mg/dL — ABNORMAL HIGH (ref 0.0–149.0)
VLDL: 47.8 mg/dL — ABNORMAL HIGH (ref 0.0–40.0)

## 2020-05-02 LAB — BASIC METABOLIC PANEL
BUN: 23 mg/dL (ref 6–23)
CO2: 29 mEq/L (ref 19–32)
Calcium: 9.2 mg/dL (ref 8.4–10.5)
Chloride: 105 mEq/L (ref 96–112)
Creatinine, Ser: 1.2 mg/dL (ref 0.40–1.50)
GFR: 50.41 mL/min — ABNORMAL LOW (ref >60.00)
Glucose, Bld: 86 mg/dL (ref 70–99)
Potassium: 4.4 mEq/L (ref 3.5–5.1)
Sodium: 141 mEq/L (ref 135–145)

## 2020-05-02 LAB — CBC WITH DIFFERENTIAL/PLATELET
Basophils Absolute: 0 10*3/uL (ref 0.0–0.1)
Basophils Relative: 0.1 % (ref 0.0–3.0)
Eosinophils Absolute: 0.1 10*3/uL (ref 0.0–0.7)
Eosinophils Relative: 0.9 % (ref 0.0–5.0)
HCT: 42.5 % (ref 39.0–52.0)
Hemoglobin: 14.2 g/dL (ref 13.0–17.0)
Lymphocytes Relative: 15.8 % (ref 12.0–46.0)
Lymphs Abs: 1.1 10*3/uL (ref 0.7–4.0)
MCHC: 33.3 g/dL (ref 30.0–36.0)
MCV: 98.3 fl (ref 78.0–100.0)
Monocytes Absolute: 0.4 10*3/uL (ref 0.1–1.0)
Monocytes Relative: 6.2 % (ref 3.0–12.0)
Neutro Abs: 5.4 10*3/uL (ref 1.4–7.7)
Neutrophils Relative %: 77 % (ref 43.0–77.0)
Platelets: 163 10*3/uL (ref 150.0–400.0)
RBC: 4.33 Mil/uL (ref 4.22–5.81)
RDW: 14 % (ref 11.5–15.5)
WBC: 7.1 10*3/uL (ref 4.0–10.5)

## 2020-05-02 LAB — HEPATIC FUNCTION PANEL
ALT: 13 U/L (ref 0–53)
AST: 15 U/L (ref 0–37)
Albumin: 4 g/dL (ref 3.5–5.2)
Alkaline Phosphatase: 44 U/L (ref 39–117)
Bilirubin, Direct: 0.1 mg/dL (ref 0.0–0.3)
Total Bilirubin: 0.6 mg/dL (ref 0.2–1.2)
Total Protein: 6.9 g/dL (ref 6.0–8.3)

## 2020-05-02 LAB — T4, FREE: Free T4: 0.81 ng/dL (ref 0.60–1.60)

## 2020-05-02 LAB — TSH: TSH: 1.42 u[IU]/mL (ref 0.35–4.50)

## 2020-05-02 LAB — T3, FREE: T3, Free: 2.4 pg/mL (ref 2.3–4.2)

## 2020-05-02 LAB — LDL CHOLESTEROL, DIRECT: Direct LDL: 148 mg/dL

## 2020-05-02 MED ORDER — GABAPENTIN 100 MG PO CAPS: 200.0000 mg | ORAL_CAPSULE | Freq: Every day | ORAL | 3 refills | Status: DC

## 2020-05-02 MED ORDER — PRAMIPEXOLE DIHYDROCHLORIDE 0.125 MG PO TABS: 0.2500 mg | ORAL_TABLET | Freq: Every day | ORAL | 3 refills | Status: DC

## 2020-05-02 NOTE — Progress Notes (Signed)
Subjective:    Patient ID: Allen Wheeler, male    DOB: 1920-01-15, 87 y.o.   MRN: [id number]  HPI Here to follow up on issues. We saw him last week for trouble sleeping due to restless legs and leg cramps. He has doubled up on the dose of Gabapentin and on Pramimexole, and he has started taking magnesium at bedtime. He says this has helped quite a bit, and he is sleeping better. His anxiety is stable. His BP is well controlled.    Review of Systems  Constitutional: Negative.   HENT: Negative.   Eyes: Negative.   Respiratory: Negative.   Cardiovascular: Negative.   Gastrointestinal: Negative.   Genitourinary: Negative.   Musculoskeletal: Positive for arthralgias.  Skin: Negative.   Neurological: Positive for tremors.  Psychiatric/Behavioral: Negative.        Objective:   Physical Exam Constitutional:      General: He is not in acute distress.    Appearance: He is well-developed and well-nourished. He is not diaphoretic.  HENT:     Head: Normocephalic and atraumatic.     Right Ear: External ear normal.     Left Ear: External ear normal.     Nose: Nose normal.     Mouth/Throat:     Mouth: Oropharynx is clear and moist.     Pharynx: No oropharyngeal exudate.  Eyes:     General: No scleral icterus.       Right eye: No discharge.        Left eye: No discharge.     Extraocular Movements: EOM normal.     Conjunctiva/sclera: Conjunctivae normal.     Pupils: Pupils are equal, round, and reactive to light.  Neck:     Thyroid: No thyromegaly.     Vascular: No JVD.     Trachea: No tracheal deviation.  Cardiovascular:     Rate and Rhythm: Normal rate and regular rhythm.     Pulses: Intact distal pulses.     Heart sounds: Normal heart sounds. No murmur heard. No friction rub. No gallop.   Pulmonary:     Effort: Pulmonary effort is normal. No respiratory distress.     Breath sounds: Normal breath sounds. No wheezing or rales.  Chest:     Chest wall: No tenderness.   Abdominal:     General: Bowel sounds are normal. There is no distension.     Palpations: Abdomen is soft. There is no mass.     Tenderness: There is no abdominal tenderness. There is no guarding or rebound.  Genitourinary:    Penis: No tenderness.   Musculoskeletal:        General: No tenderness or edema. Normal range of motion.     Cervical back: Neck supple.  Lymphadenopathy:     Cervical: No cervical adenopathy.  Skin:    General: Skin is warm and dry.     Coloration: Skin is not pale.     Findings: No erythema or rash.  Neurological:     Mental Status: He is alert and oriented to person, place, and time.     Cranial Nerves: No cranial nerve deficit.     Motor: No abnormal muscle tone.     Coordination: Coordination normal.     Deep Tendon Reflexes: Reflexes are normal and symmetric. Reflexes normal.  Psychiatric:        Mood and Affect: Mood and affect normal.        Behavior: Behavior normal.  Thought Content: Thought content normal.        Judgment: Judgment normal.           Assessment & Plan:  His insomnia has improved since the muscle cramps and  restless legs have responded top the higher doses of medications. His anxiety is controlled. We will send him for labs to check a thyroid panel, etc.  Alysia Penna, MD

## 2020-05-05 ENCOUNTER — Telehealth: Payer: Self-pay

## 2020-05-05 NOTE — Telephone Encounter (Signed)
Left message informing patient of results and recommendations. 

## 2020-05-05 NOTE — Telephone Encounter (Signed)
-----   Message from [name], MD sent at 05/05/2020  7:51 AM EST ----- Normal except mildly elevated lipids. Watch the diet closely

## 2020-05-09 ENCOUNTER — Telehealth: Payer: Self-pay | Admitting: Family Medicine

## 2020-05-09 NOTE — Telephone Encounter (Signed)
Patient states he is returning a call. Please advise  

## 2020-05-09 NOTE — Telephone Encounter (Signed)
Spoke with patient, discuss lab results.  Requested copy to be mailed to home address

## 2020-05-25 ENCOUNTER — Other Ambulatory Visit: Payer: Self-pay | Admitting: Family Medicine

## 2020-06-20 ENCOUNTER — Telehealth: Payer: Self-pay | Admitting: Family Medicine

## 2020-06-24 MED ORDER — LORAZEPAM 1 MG PO TABS: 1.0000 mg | ORAL_TABLET | Freq: Every evening | ORAL | 1 refills | Status: DC | PRN

## 2020-06-24 NOTE — Telephone Encounter (Signed)
Patient is calling and requesting a refill for Lorazepam to be sent to [facility]   [place], [place] [address]  Phone:  [phone] Fax:  [phone]   Per patient some of his medication was stolen and was told by pharmacy it was too early, please advise. CB is [phone]

## 2020-06-24 NOTE — Addendum Note (Signed)
Addended by: Alysia Penna A on: 06/24/2020 04:08 PM   Modules accepted: Orders

## 2020-06-24 NOTE — Telephone Encounter (Signed)
I sent in a new rx

## 2020-06-24 NOTE — Telephone Encounter (Signed)
Called home number, no voicemail came on to leave message

## 2020-08-17 ENCOUNTER — Encounter: Payer: Self-pay | Admitting: Family Medicine

## 2020-08-17 ENCOUNTER — Other Ambulatory Visit: Payer: Self-pay

## 2020-08-17 ENCOUNTER — Ambulatory Visit (INDEPENDENT_AMBULATORY_CARE_PROVIDER_SITE_OTHER): Payer: Medicare Other

## 2020-08-17 ENCOUNTER — Ambulatory Visit (INDEPENDENT_AMBULATORY_CARE_PROVIDER_SITE_OTHER): Payer: Medicare Other | Admitting: Family Medicine

## 2020-08-17 VITALS — BP 120/80 | HR 63 | Temp 97.6°F | Wt 162.6 lb

## 2020-08-17 DIAGNOSIS — M5441 Lumbago with sciatica, right side: Secondary | ICD-10-CM

## 2020-08-17 DIAGNOSIS — G2581 Restless legs syndrome: Secondary | ICD-10-CM

## 2020-08-17 DIAGNOSIS — M545 Low back pain, unspecified: Secondary | ICD-10-CM | POA: Diagnosis not present

## 2020-08-17 DIAGNOSIS — M549 Dorsalgia, unspecified: Secondary | ICD-10-CM | POA: Diagnosis not present

## 2020-08-17 DIAGNOSIS — M1611 Unilateral primary osteoarthritis, right hip: Secondary | ICD-10-CM | POA: Diagnosis not present

## 2020-08-17 DIAGNOSIS — M25551 Pain in right hip: Secondary | ICD-10-CM | POA: Diagnosis not present

## 2020-08-17 MED ORDER — DICLOFENAC SODIUM 75 MG PO TBEC: 75.0000 mg | DELAYED_RELEASE_TABLET | Freq: Two times a day (BID) | ORAL | 2 refills | Status: DC

## 2020-08-17 NOTE — Progress Notes (Signed)
   Subjective:    Patient ID: Allen Wheeler, male    DOB: 02/21/1920, 87 y.o.   MRN: [id number]  HPI Here for several issues. First he suddenly developed sharp pain in the right lower back and right hip area yesterday morning. He had this when he got out of bed. No recent trauma, but he has been working out in his garden. He has taken a few Ibuprofen but these have not helped. No numbness or weakness in the legs. Also his restless legs have gotten worse, and he describes "twitching" in his legs and feet that keeps him awake at night. He is currently taking 2 tablets of Pramipexole 0.125 mg at bedtime.    Review of Systems  Constitutional: Negative.   Respiratory: Negative.   Cardiovascular: Negative.   Musculoskeletal: Positive for arthralgias, back pain and myalgias.       Objective:   Physical Exam Constitutional:      Appearance: Normal appearance.     Comments: He is in pain and limps slightly   Cardiovascular:     Rate and Rhythm: Normal rate and regular rhythm.     Pulses: Normal pulses.     Heart sounds: Normal heart sounds.  Pulmonary:     Effort: Pulmonary effort is normal.     Breath sounds: Normal breath sounds.  Musculoskeletal:     Comments: 1+ edema in both feet   Neurological:     Mental Status: He is alert.           Assessment & Plan:  He has low back pain with sciatica, and we will treat this with Diclofenac 75 mg BID as needed. He can apply moist heat. Get Xrays today of the lumbar spine and the right hip. For the restless legs, he will increase the Pramipexole to taking 3 tablets at bedtime. Recheck prn. Alysia Penna, MD

## 2020-08-22 ENCOUNTER — Telehealth: Payer: Self-pay | Admitting: Family Medicine

## 2020-08-22 NOTE — Telephone Encounter (Signed)
Patient called for his X-Ray results

## 2020-08-25 NOTE — Telephone Encounter (Signed)
Left  detailed message for pt to call the office for his X-ray results

## 2020-08-26 NOTE — Telephone Encounter (Signed)
Reviewed Imaging results with pt, nothing further needed

## 2020-08-27 ENCOUNTER — Other Ambulatory Visit: Payer: Self-pay | Admitting: Family Medicine

## 2020-08-31 NOTE — Addendum Note (Signed)
Addended by: Alysia Penna A on: 08/31/2020 07:59 AM   Modules accepted: Orders

## 2020-09-09 DIAGNOSIS — R2242 Localized swelling, mass and lump, left lower limb: Secondary | ICD-10-CM | POA: Diagnosis not present

## 2020-09-09 DIAGNOSIS — L97829 Non-pressure chronic ulcer of other part of left lower leg with unspecified severity: Secondary | ICD-10-CM | POA: Diagnosis not present

## 2020-09-09 DIAGNOSIS — L03116 Cellulitis of left lower limb: Secondary | ICD-10-CM | POA: Diagnosis not present

## 2020-09-09 DIAGNOSIS — G2581 Restless legs syndrome: Secondary | ICD-10-CM | POA: Diagnosis not present

## 2020-09-09 DIAGNOSIS — S81802A Unspecified open wound, left lower leg, initial encounter: Secondary | ICD-10-CM | POA: Diagnosis not present

## 2020-09-09 DIAGNOSIS — Z79890 Hormone replacement therapy (postmenopausal): Secondary | ICD-10-CM | POA: Diagnosis not present

## 2020-09-09 DIAGNOSIS — E079 Disorder of thyroid, unspecified: Secondary | ICD-10-CM | POA: Diagnosis not present

## 2020-09-09 DIAGNOSIS — N183 Chronic kidney disease, stage 3 unspecified: Secondary | ICD-10-CM | POA: Diagnosis not present

## 2020-09-09 DIAGNOSIS — H9190 Unspecified hearing loss, unspecified ear: Secondary | ICD-10-CM | POA: Diagnosis not present

## 2020-09-09 DIAGNOSIS — Z7982 Long term (current) use of aspirin: Secondary | ICD-10-CM | POA: Diagnosis not present

## 2020-09-09 DIAGNOSIS — R7989 Other specified abnormal findings of blood chemistry: Secondary | ICD-10-CM | POA: Diagnosis not present

## 2020-09-09 DIAGNOSIS — Z79899 Other long term (current) drug therapy: Secondary | ICD-10-CM | POA: Diagnosis not present

## 2020-09-12 ENCOUNTER — Encounter: Payer: Self-pay | Admitting: Family Medicine

## 2020-09-12 ENCOUNTER — Other Ambulatory Visit: Payer: Self-pay

## 2020-09-12 ENCOUNTER — Ambulatory Visit (INDEPENDENT_AMBULATORY_CARE_PROVIDER_SITE_OTHER): Payer: Medicare Other | Admitting: Family Medicine

## 2020-09-12 VITALS — BP 118/78 | HR 72 | Temp 97.7°F | Wt 158.2 lb

## 2020-09-12 DIAGNOSIS — L03116 Cellulitis of left lower limb: Secondary | ICD-10-CM

## 2020-09-12 DIAGNOSIS — S80812D Abrasion, left lower leg, subsequent encounter: Secondary | ICD-10-CM | POA: Diagnosis not present

## 2020-09-12 NOTE — Progress Notes (Signed)
   Subjective:    Patient ID: NASIF BOS, male    DOB: [birth year], 87 y.o.   MRN: [id number]  HPI Here to follow up an ED visit on 09-09-20 to [facility] for a wound on the left lower leg. He does not remember how he originally injured the leg, but it was getting progressively worse with redness and draining bloody fluid. No fever. At the ED he was started on daily dressings with Mupiricin and a 10 day course of Doxycycline. Since then he says the wound is looking much better and the drainage has stopped. A culture of the wound grew MRSA which is susceptible to Doxycycline.    Review of Systems  Constitutional: Negative.   Respiratory: Negative.   Cardiovascular: Negative.   Skin: Positive for wound.       Objective:   Physical Exam Constitutional:      Appearance: Normal appearance.  Cardiovascular:     Rate and Rhythm: Normal rate and regular rhythm.     Pulses: Normal pulses.     Heart sounds: Normal heart sounds.  Pulmonary:     Effort: Pulmonary effort is normal.     Breath sounds: Normal breath sounds.  Skin:    Comments: There is a superficial abrasion on the left anterior lower leg about 5 cm X 3 cm in size. There is a narrow rim of erythema around this. The wound is covered by eschar. No drainage   Neurological:     Mental Status: He is alert.           Assessment & Plan:  He has cellulitis at the site of an abrasion which is caused by MRSA. He is appropriately being treated with oral Doxycycline and topical Mupiricin. The wound was dressed today, and he wil continue to dress it daily at home. He will finish the Doxycycline. Recheck as needed. We spent 35 minutes together reviewing his records and discussing these issues.  Alysia Penna, MD

## 2020-09-13 ENCOUNTER — Ambulatory Visit: Payer: Medicare Other | Attending: Family Medicine

## 2020-09-13 DIAGNOSIS — R2689 Other abnormalities of gait and mobility: Secondary | ICD-10-CM | POA: Diagnosis not present

## 2020-09-13 DIAGNOSIS — M6281 Muscle weakness (generalized): Secondary | ICD-10-CM | POA: Insufficient documentation

## 2020-09-13 DIAGNOSIS — R252 Cramp and spasm: Secondary | ICD-10-CM

## 2020-09-13 DIAGNOSIS — M545 Low back pain, unspecified: Secondary | ICD-10-CM | POA: Diagnosis not present

## 2020-09-13 DIAGNOSIS — G8929 Other chronic pain: Secondary | ICD-10-CM | POA: Insufficient documentation

## 2020-09-13 DIAGNOSIS — M25552 Pain in left hip: Secondary | ICD-10-CM | POA: Diagnosis not present

## 2020-09-13 NOTE — Therapy (Signed)
[facility] [address], [place], [place], [address] Phone: [phone]   Fax:  [phone]  Physical Therapy Evaluation  Patient Details  Name: Allen Wheeler MRN: [id number] Date of Birth: [birth year] Referring Provider (PT): Alysia Penna, MD   Encounter Date: 85/10/2020   PT End of Session - 85/07/22 1329    Visit Number 1    Date for PT Re-Evaluation 85/02/22    Authorization Type Medicare- KX at 15    Progress Note Due on Visit 10    PT Start Time 1231    PT Stop Time 1312    PT Time Calculation (min) 41 min    Activity Tolerance Patient tolerated treatment well    Behavior During Therapy [facility] for tasks assessed/performed           Past Medical History:  Diagnosis Date  . Allergy   . Anxiety   . Chickenpox   . Depression   . Dizziness, nonspecific    loss of balance last few months  . GERD (gastroesophageal reflux disease)   . Hearing loss    wears hearing aids  . Nephrolithiasis   . Normal cardiac stress test 09-20-12  . Osteoarthritis     Past Surgical History:  Procedure Laterality Date  . CATARACT EXTRACTION  2017  . CHOLECYSTECTOMY N/A 02/18/2016   Procedure: LAPAROSCOPIC CHOLECYSTECTOMY WITH POSSIBLE INTRAOPERATIVE CHOLANGIOGRAM;  Surgeon: Mickeal Skinner, MD;  Location: [facility];  Service: General;  Laterality: N/A;  . ERCP N/A 02/16/2016   Procedure: ENDOSCOPIC RETROGRADE CHOLANGIOPANCREATOGRAPHY (ERCP);  Surgeon: Teena Irani, MD;  Location: [facility] ENDOSCOPY;  Service: Endoscopy;  Laterality: N/A;  . HERNIA REPAIR     wears truss  . INGUINAL HERNIA REPAIR Right 06/21/2016   Procedure: OPEN RIGHT INGUINAL HERNIA REPAIR;  Surgeon: [name] Kinsinger, MD;  Location: [facility];  Service: General;  Laterality: Right;  . INSERTION OF MESH Right 06/21/2016   Procedure: INSERTION OF MESH;  Surgeon: [name] Kinsinger, MD;  Location: [facility];  Service: General;  Laterality: Right;  . MASTOIDECTOMY       There were no vitals filed for this visit.    Subjective Assessment - 09/13/20 1236    Subjective Pt reports LBP and Rt LE pain that began 08/16/20.  Pt reports that this pain has resolved and he twisted a few days ago when putting on a bed spread and now feels pain in the Lt low back and gluteals/hamstrings. He is now having difficulty with bearing weight through the Lt LE.    Pertinent History hard of hearing, MRSA Lt LE,    Limitations Standing;Walking    How long can you stand comfortably? weight in Rt>Lt    How long can you walk comfortably? short distances due to shooting pain    Diagnostic tests x-rau: mildly progressive multilevel spondylosis, stable endplate compression fracture at L4    Patient Stated Goals walk normally, reduce LBP/Lt LE pain    Currently in Pain? Yes    Pain Score 5    up to 10/10 and Lt LE gives out when stepping through the Lt LE   Pain Location Back    Pain Orientation Left    Pain Descriptors / Indicators Shooting;Burning    Pain Type Acute pain    Pain Radiating Towards Lt LE and thigh    Pain Onset 1 to 4 weeks ago    Pain Frequency Intermittent    Aggravating Factors  putting weight through the  heel, standing and walking (makes me lose my balance)    Pain Relieving Factors sitting down              [facility] PT Assessment - 09/13/20 0001      Assessment   Medical Diagnosis acute Rt sided low back pain with Rt sided sciatica, hip pain    Referring Provider (PT) Alysia Penna, MD    Onset Date/Surgical Date 09/09/20   original pain 08/16/20   Next MD Visit none    Prior Therapy none      Precautions   Precautions Fall      Restrictions   Weight Bearing Restrictions No      Balance Screen   Has the patient fallen in the past 6 months No    Has the patient had a decrease in activity level because of a fear of falling?  No    Is the patient reluctant to leave their home because of a fear of falling?  No      Home Environment   Living  Environment Private residence    Living Arrangements Alone    Type of [place] to live on main level with bedroom/bathroom      Prior Function   Level of Independence Independent with basic ADLs    Vocation Retired    Leisure gardening,      Cognition   Overall Cognitive Status Within Functional Limits for tasks assessed      Observation/Other Assessments   Focus on Therapeutic Outcomes (FOTO)  44 (goal is 70)      Posture/Postural Control   Posture/Postural Control Postural limitations    Postural Limitations Forward head;Rounded Shoulders;Flexed trunk;Weight shift right      ROM / Strength   AROM / PROM / Strength AROM;PROM;Strength      AROM   Overall AROM  Within functional limits for tasks performed    Overall AROM Comments lumbar A/ROM is WFLs without pain      PROM   Overall PROM  Deficits    Overall PROM Comments hip flexibility limited by 25-50% without pain      Strength   Overall Strength Deficits    Overall Strength Comments 4/5 hip strength tested in sitting bilaterally, knees 4+/5      Palpation   Palpation comment mild tension in Lt gluteals and proximal hamstrings.  No pain reported.  No pain in lumbar spine with palpation      Ambulation/Gait   Ambulation/Gait Yes    Ambulation/Gait Assistance 5: Supervision    Gait Pattern Step-through pattern;Decreased stride length;Decreased stance time - left;Trendelenburg;Antalgic    Stairs Yes    Stairs Assistance 4: Min guard    Stair Management Technique Two rails;Step to pattern    Gait Comments instability of Lt LE with weighbbearing due to pain                      Objective measurements completed on examination: See above findings.               PT Education - 09/13/20 1305    Education Details Access Code: [email or web address]    Person(s) Educated Patient    Methods Explanation;Demonstration;Handout    Comprehension Tactile cues required;Need further  instruction;Verbalized understanding;Returned demonstration            PT Short Term Goals - 09/13/20 1243      PT SHORT TERM GOAL #1   Title  be independent in initial HEP    Time 4    Period Weeks    Status New    Target Date 10/11/20      PT SHORT TERM GOAL #2   Title demonstrate symmetry on level surfaces due to reduced Lt LE pain    Time 4    Period Weeks    Status New    Target Date 10/11/20      PT SHORT TERM GOAL #3   Title report 30% fewer episides of Lt LE pain during stance phase of gait    Time 4    Period Weeks    Status New    Target Date 10/11/20             PT Long Term Goals - 09/13/20 1311      PT LONG TERM GOAL #1   Title be indpendent in advanced HEP    Time 8    Period Weeks    Status New    Target Date 11/08/20      PT LONG TERM GOAL #2   Title improve FOTO to > or = to 70    Baseline 44    Time 8    Period Weeks    Status New    Target Date 11/08/20      PT LONG TERM GOAL #3   Title ascend steps with alternating pattern and 1 rail due to improved strength and reduced pain of Lt LE    Time 8    Period Weeks    Status New    Target Date 11/08/20      PT LONG TERM GOAL #4   Title report a 70% reduction in the frequency of Lt LE pain with walking    Time 8    Period Weeks    Status New    Target Date 11/08/20                  Plan - 09/13/20 1325    Clinical Impression Statement Pt is a 87 y.o. male who presents to PT with LBP and Rt LE radiculopathy that began 08/16/20.  Pt reports that this pain has now resolved and he now has Lt sided LBP and pain with walking that began a few days after twisting while making the bed.  Recent x-rays of the lumbar spine showed mildly progressive multilevel spondylosis and end complete compression fracture of L4.  Pt demonstrates antalgic gait pattern with pelvic and Lt LE instability during step through phase of gait due to up to 10/10 pain reported in the Lt hip and leg. Pt requires max  UE support with sit to stand and max UE support with step up on the Lt LE due to pain today.  Pt with lumbar A/ROM that is WFLs for a 87 y.o. man without pain and 25-50% limitation in hip flexibility without pain.  Pt with tension in Lt gluteals and proximal hamstring without significant palpable tenderness.  Pt will benefit from skilled PT to reduce pain, improve gait and safety at home and in the community.    Personal Factors and Comorbidities Age;Comorbidity 2    Comorbidities lumbar DDD, compression fracture, age    Examination-Activity Limitations Bed Mobility;Caring for Others;Dressing;Lift;Stand;Stairs;Squat;Locomotion Level;Transfers    Examination-Participation Restrictions Community Activity;Shop;Meal Prep    Stability/Clinical Decision Making Evolving/Moderate complexity    Clinical Decision Making Moderate    Rehab Potential Good    PT Frequency 2x / week    PT  Duration 8 weeks    PT Treatment/Interventions ADLs/Self Care Home Management;Cryotherapy;Electrical Stimulation;Moist Heat;Gait training;Stair training;Functional mobility training;Therapeutic activities;Therapeutic exercise;Balance training;Neuromuscular re-education;Manual techniques;Wheelchair mobility training;Patient/family education;Passive range of motion;Dry needling;Joint Manipulations;Taping    PT Next Visit Plan work on weight shifting to the Lt LE, flexibility of Lt LE and hip, functional LE strength    PT Home Exercise Plan Access Code: [email or web address]    Consulted and Agree with Plan of Care Patient           Patient will benefit from skilled therapeutic intervention in order to improve the following deficits and impairments:  Abnormal gait,Decreased activity tolerance,Decreased balance,Decreased strength,Postural dysfunction,Improper body mechanics,Impaired flexibility,Pain,Increased muscle spasms,Decreased endurance,Decreased safety awareness,Difficulty walking,Decreased range of motion  Visit Diagnosis: Chronic  bilateral low back pain without sciatica - Plan: PT plan of care cert/re-cert  Cramp and spasm - Plan: PT plan of care cert/re-cert  Other abnormalities of gait and mobility - Plan: PT plan of care cert/re-cert     Problem List Patient Active Problem List   Diagnosis Date Noted  . Neuropathy 04/29/2020  . Nocturnal leg cramps 04/29/2020  . IBS (irritable bowel syndrome) 01/06/2020  . 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  . Lumbar pain 01/06/2020  . Multiple environmental allergies 12/28/2016  . Restless legs syndrome 12/28/2016  . Inguinal hernia 06/21/2016  . Right inguinal hernia 04/19/2016  . Left hand weakness   . Sepsis ([place]) 02/15/2016  . Cholelithiasis with acute cholangitis with biliary obstruction 02/14/2016  . Hypothyroidism 12/13/2015  . Eczema 09/16/2014  . Abnormal ECG 09/03/2012  . SOMNOLENCE 10/31/2009  . BPH with urinary obstruction 02/16/2009  . Pruritic disorder 02/16/2009  . WEAKNESS 02/16/2009  . ELEVATED BLOOD PRESSURE 02/16/2009  . Anxiety 05/27/2008  . Depression 05/27/2008  . GERD 05/27/2008  . Osteoarthritis 05/27/2008  . NEPHROLITHIASIS, HX OF 05/27/2008    Sigurd Sos, PT 09/13/20 1:31 PM  [facility] [address] [place], [place], [address] Phone: [phone]   Fax:  [phone]  Name: TANDRE CONLY MRN: [id number] Date of Birth: [birth year]

## 2020-09-13 NOTE — Patient Instructions (Signed)
Access Code: [email or web address] URL: [email or web address] Date: 09/13/2020 Prepared by: Claiborne Billings  Exercises Seated Hamstring Stretch - 3 x daily - 7 x weekly - 3 sets - 3 reps - 20 hold Supine Piriformis Stretch with Leg Straight - 3 x daily - 7 x weekly - 1 sets - 3 reps - 20 hold Stride Stance Weight Shift - 2 x daily - 7 x weekly - 2 sets - 10 reps Side to Side Weight Shift with Unilateral Counter Support - 2 x daily - 7 x weekly - 2 sets - 10 reps

## 2020-09-14 ENCOUNTER — Other Ambulatory Visit: Payer: Self-pay

## 2020-09-14 ENCOUNTER — Ambulatory Visit: Payer: Medicare Other | Admitting: Physical Therapy

## 2020-09-14 ENCOUNTER — Ambulatory Visit: Payer: Medicare Other

## 2020-09-14 DIAGNOSIS — R2689 Other abnormalities of gait and mobility: Secondary | ICD-10-CM

## 2020-09-14 DIAGNOSIS — M545 Low back pain, unspecified: Secondary | ICD-10-CM | POA: Diagnosis not present

## 2020-09-14 DIAGNOSIS — R252 Cramp and spasm: Secondary | ICD-10-CM | POA: Diagnosis not present

## 2020-09-14 DIAGNOSIS — M6281 Muscle weakness (generalized): Secondary | ICD-10-CM | POA: Diagnosis not present

## 2020-09-14 DIAGNOSIS — M25552 Pain in left hip: Secondary | ICD-10-CM

## 2020-09-14 DIAGNOSIS — G8929 Other chronic pain: Secondary | ICD-10-CM | POA: Diagnosis not present

## 2020-09-14 NOTE — Therapy (Signed)
[facility] [address], [place], [place], [address] Phone: [phone]   Fax:  [phone]  Physical Therapy Treatment  Patient Details  Name: Allen Wheeler MRN: [id number] Date of Birth: Jan 24, 1920 Referring Provider (PT): Alysia Penna, MD   Encounter Date: 09/14/2020   PT End of Session - 09/14/20 1150    Visit Number 2    Date for PT Re-Evaluation 11/08/20    Authorization Type Medicare- KX at 15    Progress Note Due on Visit 10    PT Start Time 5329    PT Stop Time 1229    PT Time Calculation (min) 45 min    Activity Tolerance Patient tolerated treatment well    Behavior During Therapy [facility] for tasks assessed/performed           Past Medical History:  Diagnosis Date  . Allergy   . Anxiety   . Chickenpox   . Depression   . Dizziness, nonspecific    loss of balance last few months  . GERD (gastroesophageal reflux disease)   . Hearing loss    wears hearing aids  . Nephrolithiasis   . Normal cardiac stress test 09-20-12  . Osteoarthritis     Past Surgical History:  Procedure Laterality Date  . CATARACT EXTRACTION  2017  . CHOLECYSTECTOMY N/A 02/18/2016   Procedure: LAPAROSCOPIC CHOLECYSTECTOMY WITH POSSIBLE INTRAOPERATIVE CHOLANGIOGRAM;  Surgeon: Mickeal Skinner, MD;  Location: [place];  Service: General;  Laterality: N/A;  . ERCP N/A 02/16/2016   Procedure: ENDOSCOPIC RETROGRADE CHOLANGIOPANCREATOGRAPHY (ERCP);  Surgeon: Teena Irani, MD;  Location: [facility] END[facility];  Service: Endoscopy;  Laterality: N/A;  . HERNIA REPAIR     wears truss  . INGUINAL HERNIA REPAIR Right 06/21/2016   Procedure: OPEN RIGHT INGUINAL HERNIA REPAIR;  Surgeon: [name] Kinsinger, MD;  Location: [facility];  Service: General;  Laterality: Right;  . INSERTION OF MESH Right 06/21/2016   Procedure: INSERTION OF MESH;  Surgeon: [name] Kinsinger, MD;  Location: [facility];  Service: General;  Laterality: Right;  . MASTOIDECTOMY       There were no vitals filed for this visit.   Subjective Assessment - 09/14/20 1142    Subjective Pt reports Rt shoulder pain and Lt LE pain, reported he has difficulties with pain in Lt lower leg with "infection" and reports he has been trying to do all HEP but limited with reps 2/2 pain at infection site. Pt also reports he has been losing his balance intermittently at home.    Pertinent History hard of hearing, MRSA Lt LE,    Limitations Standing;Walking    How long can you stand comfortably? weight in Rt>Lt    How long can you walk comfortably? short distances due to shooting pain    Diagnostic tests x-rau: mildly progressive multilevel spondylosis, stable endplate compression fracture at L4    Patient Stated Goals walk normally, reduce LBP/Lt LE pain    Currently in Pain? Yes    Pain Score 4     Pain Location Leg    Pain Orientation Left    Pain Descriptors / Indicators Other (Comment)   stinging   Pain Type Acute pain    Pain Onset 1 to 4 weeks ago    Pain Frequency Intermittent                             [facility] Adult PT Treatment/Exercise - 09/14/20 0001  Ambulation/Gait   Ambulation/Gait Yes    Ambulation/Gait Assistance 5: Supervision    Ambulation/Gait Assistance Details noted Lt sided limp, no AD, inconsistent step length on Lt LE and trunk lateral swaying    Ambulation Distance (Feet) 100 Feet    Assistive device Straight cane   came into clinic without AD, improved with use of SPC   Gait Comments instability of Lt LE with weighbbearing due to pain      Balance   Balance Assessed Yes      High Level Balance   High Level Balance Activities Side stepping;Other (comment)    High Level Balance Comments 10 steps Rt and Lt no external support at [facility] with cues for increased step width and trunk extension.      Exercises   Exercises Knee/Hip      Knee/Hip Exercises: Stretches   Passive Hamstring Stretch Both;3 reps    Passive Hamstring Stretch  Limitations in sitting 3x45s each      Knee/Hip Exercises: Aerobic   Nustep 8 mins L2, cues for attention to task and active participation      Knee/Hip Exercises: Standing   Gait Training step throughout with one UE support, 2x10 each LE for improved gait mechanics    Other Standing Knee Exercises standing hip extension: unable to complete on Lt 2/2 pain; 2x8 on Rt on pain reported      Manual Therapy   Manual Therapy Soft tissue mobilization    Manual therapy comments theragun on Lt posterior hip and hamstring at point of tenderness                  PT Education - 09/14/20 1240    Education Details Access Code: [email or web address]; pt educated on proper gait mechanics, safety and use of SPC for gait for home    Person(s) Educated Patient    Methods Explanation;Demonstration;Verbal cues;Handout    Comprehension Verbalized understanding;Returned demonstration;Tactile cues required;Verbal cues required            PT Short Term Goals - 09/13/20 1243      PT SHORT TERM GOAL #1   Title be independent in initial HEP    Time 4    Period Weeks    Status New    Target Date 10/11/20      PT SHORT TERM GOAL #2   Title demonstrate symmetry on level surfaces due to reduced Lt LE pain    Time 4    Period Weeks    Status New    Target Date 10/11/20      PT SHORT TERM GOAL #3   Title report 30% fewer episides of Lt LE pain during stance phase of gait    Time 4    Period Weeks    Status New    Target Date 10/11/20             PT Long Term Goals - 09/13/20 1311      PT LONG TERM GOAL #1   Title be indpendent in advanced HEP    Time 8    Period Weeks    Status New    Target Date 11/08/20      PT LONG TERM GOAL #2   Title improve FOTO to > or = to 70    Baseline 44    Time 8    Period Weeks    Status New    Target Date 11/08/20      PT LONG TERM GOAL #3  Title ascend steps with alternating pattern and 1 rail due to improved strength and reduced pain of Lt LE    Time  8    Period Weeks    Status New    Target Date 11/08/20      PT LONG TERM GOAL #4   Title report a 70% reduction in the frequency of Lt LE pain with walking    Time 8    Period Weeks    Status New    Target Date 11/08/20                 Plan - 09/14/20 1150    Clinical Impression Statement Pt presents to clinic with noted limp at [place] and poor gait cadence, reported pain in Lt leg and Rt shoulder. Pt reports he feels that he has been losing his balance more frequently at home, has a cane but does not typically use it. Pt demonstrated improved gait mechanics, decreased compensatory strategies and decreased pain with weight bearing on Lt LE. Pt benefited from gait training and strengthening and stretching exercises to promote improved mechanics throughout session. Pt reported understanding HEP better post treatment. Pt also reported increased pain with hip extension on Lt hip with exercises, no pain with completing this exercise on Rt LE. Pt's overall gait mechanics improved post training with cane and standing balance activities for improved mechanics. Pt also reported improved pain post manual work at [employer] and hamstring. Pt would continue to benefit from skilled PT for further improvment in pain levels, balance, strengthening globally and increased tolerance to activity.    Personal Factors and Comorbidities Age;Comorbidity 2    Comorbidities lumbar DDD, compression fracture, age    Examination-Activity Limitations Bed Mobility;Caring for Others;Dressing;Lift;Stand;Stairs;Squat;Locomotion Level;Transfers    Examination-Participation Restrictions Community Activity;Shop;Meal Prep    Stability/Clinical Decision Making Evolving/Moderate complexity    Clinical Decision Making Moderate    Rehab Potential Good    PT Frequency 2x / week    PT Duration 8 weeks    PT Treatment/Interventions ADLs/Self Care Home Management;Cryotherapy;Electrical Stimulation;Moist Heat;Gait training;Stair  training;Functional mobility training;Therapeutic activities;Therapeutic exercise;Balance training;Neuromuscular re-education;Manual techniques;Wheelchair mobility training;Patient/family education;Passive range of motion;Dry needling;Joint Manipulations;Taping    PT Next Visit Plan work on weight shifting to the Lt LE, flexibility of Lt LE and hip, functional LE strength    PT Home Exercise Plan Access Code: [email or web address]    Consulted and Agree with Plan of Care Patient           Patient will benefit from skilled therapeutic intervention in order to improve the following deficits and impairments:  Abnormal gait,Decreased activity tolerance,Decreased balance,Decreased strength,Postural dysfunction,Improper body mechanics,Impaired flexibility,Pain,Increased muscle spasms,Decreased endurance,Decreased safety awareness,Difficulty walking,Decreased range of motion  Visit Diagnosis: Muscle weakness (generalized)  Other abnormalities of gait and mobility  Pain in left hip     Problem List Patient Active Problem List   Diagnosis Date Noted  . Neuropathy 04/29/2020  . Nocturnal leg cramps 04/29/2020  . IBS (irritable bowel syndrome) 01/06/2020  . 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  . Lumbar pain 01/06/2020  . Multiple environmental allergies 12/28/2016  . Restless legs syndrome 12/28/2016  . Inguinal hernia 06/21/2016  . Right inguinal hernia 04/19/2016  . Left hand weakness   . Sepsis ([place]) 02/15/2016  . Cholelithiasis with acute cholangitis with biliary obstruction 02/14/2016  . Hypothyroidism 12/13/2015  . Eczema 09/16/2014  . Abnormal ECG 09/03/2012  . SOMNOLENCE 10/31/2009  . BPH with urinary obstruction 02/16/2009  . Pruritic disorder 02/16/2009  .  WEAKNESS 02/16/2009  . ELEVATED BLOOD PRESSURE 02/16/2009  . Anxiety 05/27/2008  . Depression 05/27/2008  . GERD 05/27/2008  . Osteoarthritis 05/27/2008  . NEPHROLITHIASIS, HX OF 05/27/2008    Stacy Gardner, PT 09/15/2210:42  PM    [facility] [address], [place], [place], [address] Phone: [phone]   Fax:  [phone]  Name: AIKEN WITHEM MRN: [id number] Date of Birth: May 03, 1919

## 2020-09-15 ENCOUNTER — Other Ambulatory Visit: Payer: Self-pay | Admitting: Family Medicine

## 2020-09-26 ENCOUNTER — Ambulatory Visit: Payer: Medicare Other | Admitting: Physical Therapy

## 2020-09-26 ENCOUNTER — Telehealth: Payer: Self-pay | Admitting: Physical Therapy

## 2020-09-26 NOTE — Telephone Encounter (Signed)
PTA called pt for no show for todays appt. He did not see he had an appt today. He will be here on Wednesday.

## 2020-09-28 ENCOUNTER — Other Ambulatory Visit: Payer: Self-pay

## 2020-09-28 ENCOUNTER — Ambulatory Visit: Payer: Medicare Other | Admitting: Physical Therapy

## 2020-09-28 DIAGNOSIS — R252 Cramp and spasm: Secondary | ICD-10-CM

## 2020-09-28 DIAGNOSIS — G8929 Other chronic pain: Secondary | ICD-10-CM

## 2020-09-28 DIAGNOSIS — M6281 Muscle weakness (generalized): Secondary | ICD-10-CM | POA: Diagnosis not present

## 2020-09-28 DIAGNOSIS — R2689 Other abnormalities of gait and mobility: Secondary | ICD-10-CM

## 2020-09-28 DIAGNOSIS — M545 Low back pain, unspecified: Secondary | ICD-10-CM | POA: Diagnosis not present

## 2020-09-28 DIAGNOSIS — M25552 Pain in left hip: Secondary | ICD-10-CM

## 2020-09-28 NOTE — Patient Instructions (Signed)
Access Code: [email or web address] URL: [email or web address] Date: 09/28/2020 Prepared by: Everardo All  Exercises Seated Hamstring Stretch - 3 x daily - 7 x weekly - 3 sets - 3 reps - 20 hold Supine Piriformis Stretch with Leg Straight - 3 x daily - 7 x weekly - 1 sets - 3 reps - 20 hold Seated Hip Abduction with Resistance - 1 x daily - 7 x weekly - 3 sets - 10 reps Seated Hip Abduction with Resistance (Mirrored) - 1 x daily - 7 x weekly - 3 sets - 10 reps Seated March - 1 x daily - 7 x weekly - 3 sets - 10 reps Seated March (Mirrored) - 1 x daily - 7 x weekly - 3 sets - 10 reps

## 2020-09-28 NOTE — Therapy (Signed)
[facility] [address], [place], [place], [address] Phone: [phone]   Fax:  [phone]  Physical Therapy Treatment  Patient Details  Name: Allen Wheeler MRN: [id number] Date of Birth: [birth year] Referring Provider (PT): Alysia Penna, MD   Encounter Date: 09/28/2020   PT End of Session - 09/28/20 1145     Visit Number 3    Date for PT Re-Evaluation 11/08/20    Authorization Type Medicare- KX at 15    Progress Note Due on Visit 10    PT Start Time 1100    PT Stop Time 1132    PT Time Calculation (min) 32 min    Activity Tolerance Patient tolerated treatment well    Behavior During Therapy [facility] for tasks assessed/performed             Past Medical History:  Diagnosis Date   Allergy    Anxiety    Chickenpox    Depression    Dizziness, nonspecific    loss of balance last few months   GERD (gastroesophageal reflux disease)    Hearing loss    wears hearing aids   Nephrolithiasis    Normal cardiac stress test 09-20-12   Osteoarthritis     Past Surgical History:  Procedure Laterality Date   CATARACT EXTRACTION  2017   CHOLECYSTECTOMY N/A 02/18/2016   Procedure: LAPAROSCOPIC CHOLECYSTECTOMY WITH POSSIBLE INTRAOPERATIVE CHOLANGIOGRAM;  Surgeon: Mickeal Skinner, MD;  Location: [place];  Service: General;  Laterality: N/A;   ERCP N/A 02/16/2016   Procedure: ENDOSCOPIC RETROGRADE CHOLANGIOPANCREATOGRAPHY (ERCP);  Surgeon: Teena Irani, MD;  Location: [facility];  Service: Endoscopy;  Laterality: N/A;   HERNIA REPAIR     wears truss   INGUINAL HERNIA REPAIR Right 06/21/2016   Procedure: OPEN RIGHT INGUINAL HERNIA REPAIR;  Surgeon: [name] Kinsinger, MD;  Location: [facility];  Service: General;  Laterality: Right;   INSERTION OF MESH Right 06/21/2016   Procedure: INSERTION OF MESH;  Surgeon: [name] Kinsinger, MD;  Location: [facility];  Service: General;  Laterality: Right;   MASTOIDECTOMY      There  were no vitals filed for this visit.   Subjective Assessment - 09/28/20 1109     Subjective Patient reports 0/10 pain. States that he feels that issue is largely resolved and does not feel that he needs to continue physical therapy. Would like today to be the last day as he will be going on vacation.    Pertinent History hard of hearing, MRSA Lt LE,    Limitations Standing;Walking    How long can you stand comfortably? weight in Rt>Lt    How long can you walk comfortably? short distances due to shooting pain    Diagnostic tests x-rau: mildly progressive multilevel spondylosis, stable endplate compression fracture at L4    Patient Stated Goals walk normally, reduce LBP/Lt LE pain    Currently in Pain? No/denies                [facility] PT Assessment - 09/28/20 0001       Assessment   Medical Diagnosis acute Rt sided low back pain with Rt sided sciatica, hip pain    Referring Provider (PT) Alysia Penna, MD    Onset Date/Surgical Date 09/09/20    Next MD Visit none    Prior Therapy none      Precautions   Precautions Fall      Restrictions   Weight Bearing Restrictions No  Balance Screen   Has the patient fallen in the past 6 months No    Has the patient had a decrease in activity level because of a fear of falling?  No    Is the patient reluctant to leave their home because of a fear of falling?  No      Home Environment   Living Environment Private residence    Living Arrangements Alone    Type of [place] to live on main level with bedroom/bathroom      Prior Function   Level of Independence Independent with basic ADLs    Vocation Retired    Leisure gardening      Cognition   Overall Cognitive Status Within Functional Limits for tasks assessed      Observation/Other Assessments   Focus on Therapeutic Outcomes (FOTO)  72 (goal 70)      Posture/Postural Control   Posture/Postural Control Postural limitations    Postural Limitations  Forward head;Rounded Shoulders;Increased thoracic kyphosis      Ambulation/Gait   Ambulation/Gait Yes    Ambulation/Gait Assistance 7: Independent    Ambulation Distance (Feet) 50 Feet    Assistive device None    Gait Pattern Step-through pattern;Decreased arm swing - right;Decreased arm swing - left    Stairs Yes    Stairs Assistance 6: Modified independent (Device/Increase time)    Stair Management Technique One rail Left;Step to pattern    Gait Comments even weight distribution; no notied knee buckling; equal step length                           [facility] Adult PT Treatment/Exercise - 09/28/20 0001       Knee/Hip Exercises: Seated   Clamshell with TheraBand Red   x15 reps   Knee/Hip Flexion x10 reps Lt/Rt                    PT Education - 09/28/20 1138     Education Details seated hip abduction green tband, seated hip/knee flexion    Person(s) Educated Patient    Methods Explanation;Demonstration;Tactile cues;Verbal cues;Handout    Comprehension Verbalized understanding;Returned demonstration;Verbal cues required;Tactile cues required              PT Short Term Goals - 09/28/20 1144       PT SHORT TERM GOAL #1   Title be independent in initial HEP    Time 4    Period Weeks    Status Achieved   Patient reports independence   Target Date 10/11/20      PT SHORT TERM GOAL #2   Title demonstrate symmetry on level surfaces due to reduced Lt LE pain    Time 4    Period Weeks    Status Achieved    Target Date 10/11/20      PT SHORT TERM GOAL #3   Title report 30% fewer episides of Lt LE pain during stance phase of gait    Time 4    Period Weeks    Status Achieved    Target Date 10/11/20               PT Long Term Goals - 09/28/20 1144       PT LONG TERM GOAL #1   Title be indpendent in advanced HEP    Time 8    Period Weeks    Status Achieved  PT LONG TERM GOAL #2   Title improve FOTO to > or = to 70    Baseline 44     Time 8    Period Weeks    Status Achieved   72     PT LONG TERM GOAL #3   Title ascend steps with alternating pattern and 1 rail due to improved strength and reduced pain of Lt LE    Time 8    Period Weeks    Status Partially Met   step to pattern; handrail x1     PT LONG TERM GOAL #4   Title report a 70% reduction in the frequency of Lt LE pain with walking    Time 8    Period Weeks    Status Achieved   100% improvement                  Plan - 09/28/20 1139     Clinical Impression Statement Patient is a 87 y/o male referred due to Rt side low back pain with sciatica. Patient demos significantly improved gait pattern with equal weight distribution in stance phase and without use of AD. Functional mobility improved as patient able to ascend/descent stairs using handrail x1. FOTO score significantly improved indicating improved overall function. Some deficits continue to persist as patient unable to negotiate stairs using reciprocal pattern and he exhibits some knee instability during stance phase of stair negotiation. However, no assistance required from therapist and patient verbalizes sufficient knowledge for use of step-to pattern for increased stability. Though patient could benefit from further skilled intervention to continue to address remaining impairements he is safe to D/C to HEP as requested.    Personal Factors and Comorbidities Age;Comorbidity 2    Comorbidities lumbar DDD, compression fracture, age    Examination-Activity Limitations Bed Mobility;Caring for Others;Dressing;Lift;Stand;Stairs;Squat;Locomotion Level;Transfers    Examination-Participation Restrictions Community Activity;Shop;Meal Prep    Rehab Potential Good    PT Frequency 2x / week    PT Duration 8 weeks    PT Treatment/Interventions ADLs/Self Care Home Management;Cryotherapy;Electrical Stimulation;Moist Heat;Gait training;Stair training;Functional mobility training;Therapeutic  activities;Therapeutic exercise;Balance training;Neuromuscular re-education;Manual techniques;Wheelchair mobility training;Patient/family education;Passive range of motion;Dry needling;Joint Manipulations;Taping    PT Next Visit Plan D/C to HEP    PT Home Exercise Plan Access Code: [email or web address]    Consulted and Agree with Plan of Care Patient             Patient will benefit from skilled therapeutic intervention in order to improve the following deficits and impairments:  Abnormal gait, Decreased activity tolerance, Decreased balance, Decreased strength, Postural dysfunction, Improper body mechanics, Impaired flexibility, Pain, Increased muscle spasms, Decreased endurance, Decreased safety awareness, Difficulty walking, Decreased range of motion  Visit Diagnosis: Muscle weakness (generalized)  Other abnormalities of gait and mobility  Pain in left hip  Chronic bilateral low back pain without sciatica  Cramp and spasm     Problem List Patient Active Problem List   Diagnosis Date Noted   Neuropathy 04/29/2020   Nocturnal leg cramps 04/29/2020   IBS (irritable bowel syndrome) 01/06/2020   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   Lumbar pain 01/06/2020   Multiple environmental allergies 12/28/2016   Restless legs syndrome 12/28/2016   Inguinal hernia 06/21/2016   Right inguinal hernia 04/19/2016   Left hand weakness    Sepsis ([employer]) 02/15/2016   Cholelithiasis with acute cholangitis with biliary obstruction 02/14/2016   Hypothyroidism 12/13/2015   Eczema 09/16/2014   Abnormal ECG 09/03/2012   SOMNOLENCE 10/31/2009   BPH  with urinary obstruction 02/16/2009   Pruritic disorder 02/16/2009   WEAKNESS 02/16/2009   ELEVATED BLOOD PRESSURE 02/16/2009   Anxiety 05/27/2008   Depression 05/27/2008   GERD 05/27/2008   Osteoarthritis 05/27/2008   NEPHROLITHIASIS, HX OF 05/27/2008   PHYSICAL THERAPY DISCHARGE SUMMARY  Visits from Start of Care: 3  Current functional level related to goals /  functional outcomes: See above   Remaining deficits: See above   Education / Equipment: See above   Patient agrees to discharge. Patient goals were partially met. Patient is being discharged due to being pleased with the current functional level.  Everardo All PT, DPT  09/28/20 11:47 AM    [facility] [address] [place], [place], [address] Phone: [phone]   Fax:  [phone]  Name: Allen Wheeler MRN: [id number] Date of Birth: 1919-07-16

## 2020-10-03 ENCOUNTER — Encounter: Payer: Medicare Other | Admitting: Physical Therapy

## 2020-10-11 ENCOUNTER — Encounter: Payer: Medicare Other | Admitting: Physical Therapy

## 2020-10-24 ENCOUNTER — Encounter: Payer: Medicare Other | Admitting: Physical Therapy

## 2020-11-24 ENCOUNTER — Other Ambulatory Visit: Payer: Self-pay | Admitting: Family Medicine

## 2020-12-19 ENCOUNTER — Other Ambulatory Visit: Payer: Self-pay | Admitting: Family Medicine

## 2020-12-19 ENCOUNTER — Other Ambulatory Visit: Payer: Self-pay

## 2020-12-19 MED ORDER — CITALOPRAM HYDROBROMIDE 10 MG PO TABS: 10.0000 mg | ORAL_TABLET | Freq: Every day | ORAL | 0 refills | Status: DC

## 2020-12-19 NOTE — Telephone Encounter (Signed)
Last office visit- 09/12/20 No future office visit scheduled

## 2020-12-26 ENCOUNTER — Other Ambulatory Visit: Payer: Self-pay | Admitting: Family Medicine

## 2021-01-10 ENCOUNTER — Other Ambulatory Visit: Payer: Self-pay | Admitting: Family Medicine

## 2021-01-10 DIAGNOSIS — E039 Hypothyroidism, unspecified: Secondary | ICD-10-CM

## 2021-02-15 ENCOUNTER — Encounter: Payer: Self-pay | Admitting: Family Medicine

## 2021-02-15 ENCOUNTER — Ambulatory Visit (INDEPENDENT_AMBULATORY_CARE_PROVIDER_SITE_OTHER): Payer: Medicare Other | Admitting: Family Medicine

## 2021-02-15 ENCOUNTER — Other Ambulatory Visit: Payer: Self-pay

## 2021-02-15 VITALS — BP 118/76 | HR 54 | Temp 97.9°F | Wt 158.1 lb

## 2021-02-15 DIAGNOSIS — K580 Irritable bowel syndrome with diarrhea: Secondary | ICD-10-CM | POA: Diagnosis not present

## 2021-02-15 DIAGNOSIS — L309 Dermatitis, unspecified: Secondary | ICD-10-CM | POA: Diagnosis not present

## 2021-02-15 MED ORDER — HYDROXYZINE PAMOATE 25 MG PO CAPS: 25.0000 mg | ORAL_CAPSULE | Freq: Four times a day (QID) | ORAL | 5 refills | Status: DC | PRN

## 2021-02-15 MED ORDER — DIPHENOXYLATE-ATROPINE 2.5-0.025 MG PO TABS: ORAL_TABLET | ORAL | 5 refills | Status: DC

## 2021-02-15 NOTE — Progress Notes (Signed)
   Subjective:    Patient ID: Allen Wheeler, male    DOB: 1919-07-20, 87 y.o.   MRN: [id number]  HPI Here for several issues. First he has had IBS for years, but over the past year he has dealt with a very annoying problem of urgent BMs and diarrhea immediately after eating any type of food in the mornings. He often wears protective underpants. He has developed a habit of not eating any food at all until his supper in the evenings. The IBS does not bother him in the evenings. No pain or fever. He has tried to eliminate various foods but anything he puts in the stomach before 5 pm will cause the issue. Second he has had very dry skin and eczema for years, and uses Cerave lotion several times a day. However sometimes the itching is almost unbearable. He does take low dose Prednisone daily.    Review of Systems  Constitutional: Negative.   Respiratory: Negative.    Cardiovascular: Negative.   Gastrointestinal:  Positive for diarrhea. Negative for abdominal distention, abdominal pain, anal bleeding, blood in stool, constipation, nausea, rectal pain and vomiting.  Skin:  Positive for rash.      Objective:   Physical Exam Constitutional:      Appearance: Normal appearance. He is not ill-appearing.  Cardiovascular:     Rate and Rhythm: Normal rate and regular rhythm.     Pulses: Normal pulses.     Heart sounds: Normal heart sounds.  Pulmonary:     Effort: Pulmonary effort is normal.     Breath sounds: Normal breath sounds.  Abdominal:     General: Abdomen is flat. Bowel sounds are normal. There is no distension.     Palpations: Abdomen is soft. There is no mass.     Tenderness: There is no abdominal tenderness. There is no guarding or rebound.     Hernia: No hernia is present.  Skin:    Findings: No erythema or rash.  Neurological:     Mental Status: He is alert.          Assessment & Plan:  For the IBS, he will try taking 1 or 2 tablets of Lomotil every morning. For the  itching, he will try Hydroxyzine as needed. Continue with moisturizing lotions.  Alysia Penna, MD

## 2021-02-28 ENCOUNTER — Other Ambulatory Visit: Payer: Self-pay | Admitting: Family Medicine

## 2021-04-10 ENCOUNTER — Other Ambulatory Visit: Payer: Self-pay | Admitting: Family Medicine

## 2021-04-11 NOTE — Telephone Encounter (Signed)
LOV visit was 02/15/2021 Last refill done on 04/15/2020 Please advise

## 2021-04-28 ENCOUNTER — Ambulatory Visit: Payer: Medicare Other

## 2021-05-12 ENCOUNTER — Ambulatory Visit (INDEPENDENT_AMBULATORY_CARE_PROVIDER_SITE_OTHER): Payer: Medicare Other

## 2021-05-12 VITALS — BP 110/62 | Temp 97.5°F | Ht 66.0 in | Wt 159.0 lb

## 2021-05-12 DIAGNOSIS — Z0000 Encounter for general adult medical examination without abnormal findings: Secondary | ICD-10-CM

## 2021-05-12 NOTE — Progress Notes (Signed)
Subjective:   Allen Wheeler is a 88 y.o. male who presents for Medicare Annual/Subsequent preventive examination.  Review of Systems     Cardiac Risk Factors include: advanced age (>88men, >12 women);male gender     Objective:    Today's Vitals   05/12/21 1304  BP: 110/62  Temp: (!) 97.5 F (36.4 C)  TempSrc: Oral  SpO2: 95%  Weight: 159 lb (72.1 kg)  Height: 5\' 6"  (1.676 m)   Body mass index is 25.66 kg/m.  Advanced Directives 05/12/2021 09/13/2020 04/27/2020 07/16/2016 06/15/2016 02/14/2016 12/22/2013  Does Patient Have a Medical Advance Directive? Yes Yes Yes No Yes Yes No  Type of Paramedic of [place];Living will Healthcare Power of [place];Living will - [place];Living will Living will -  Does patient want to make changes to medical advance directive? No - Patient declined No - Patient declined No - Patient declined - No - Patient declined - -  Copy of [place] in Chart? No - copy requested - No - copy requested - - - -    Current Medications (verified) Outpatient Encounter Medications as of 05/12/2021  Medication Sig   albuterol (PROVENTIL HFA;VENTOLIN HFA) 108 (90 Base) MCG/ACT inhaler Inhale 2 puffs into the lungs every 4 (four) hours as needed for wheezing or shortness of breath.   citalopram (CELEXA) 10 MG tablet TAKE 1 TABLET ONCE DAILY.   dicyclomine (BENTYL) 20 MG tablet TAKE ONE TABLET BY MOUTH IN THE MORNING   diphenoxylate-atropine (LOMOTIL) 2.5-0.025 MG tablet Take 1 or 2 tablets every morning   gabapentin (NEURONTIN) 100 MG capsule Take 2 capsules (200 mg total) by mouth at bedtime.   hydrOXYzine (VISTARIL) 25 MG capsule Take 1 capsule (25 mg total) by mouth every 6 (six) hours as needed for itching.   levothyroxine (SYNTHROID) 50 MCG tablet TAKE 1 TABLET ONCE DAILY.   LORazepam (ATIVAN) 1 MG tablet TAKE ONE TABLET BY MOUTH AT BEDTIME AS NEEDED   Multiple Vitamin  (MULTIVITAMIN WITH MINERALS) TABS tablet Take 1 tablet by mouth daily. Centrum Silver   omeprazole (PRILOSEC) 40 MG capsule TAKE 1 CAPSULE DAILY.   pramipexole (MIRAPEX) 0.125 MG tablet TAKE 2 TABLETS AT BEDTIME.   predniSONE (DELTASONE) 10 MG tablet TAKE 1 TABLET ONCE DAILY.   tamsulosin (FLOMAX) 0.4 MG CAPS capsule TAKE TWO CAPSULES BY MOUTH DAILY   Vitamins/Minerals TABS Take by mouth.   No facility-administered encounter medications on file as of 05/12/2021.    Allergies (verified) Patient has no known allergies.   History: Past Medical History:  Diagnosis Date   Allergy    Anxiety    Chickenpox    Depression    Dizziness, nonspecific    loss of balance last few months   GERD (gastroesophageal reflux disease)    Hearing loss    wears hearing aids   Nephrolithiasis    Normal cardiac stress test 09-20-12   Osteoarthritis    Past Surgical History:  Procedure Laterality Date   CATARACT EXTRACTION  2017   CHOLECYSTECTOMY N/A 02/18/2016   Procedure: LAPAROSCOPIC CHOLECYSTECTOMY WITH POSSIBLE INTRAOPERATIVE CHOLANGIOGRAM;  Surgeon: Mickeal Skinner, MD;  Location: [facility];  Service: General;  Laterality: N/A;   ERCP N/A 02/16/2016   Procedure: ENDOSCOPIC RETROGRADE CHOLANGIOPANCREATOGRAPHY (ERCP);  Surgeon: Teena Irani, MD;  Location: [facility] ENDOSCOPY;  Service: Endoscopy;  Laterality: N/A;   HERNIA REPAIR     wears truss   INGUINAL HERNIA REPAIR Right 06/21/2016  Procedure: OPEN RIGHT INGUINAL HERNIA REPAIR;  Surgeon: [name] Kinsinger, MD;  Location: [facility];  Service: General;  Laterality: Right;   INSERTION OF MESH Right 06/21/2016   Procedure: INSERTION OF MESH;  Surgeon: [name] Kinsinger, MD;  Location: [facility];  Service: General;  Laterality: Right;   MASTOIDECTOMY     Family History  Problem Relation Age of Onset   Heart disease Mother        Pacemaker   Colon cancer Father    Social History   Socioeconomic History   Marital status: Widowed    Spouse name: Not  on file   Number of children: 1   Years of education: Not on file   Highest education level: Not on file  Occupational History   Occupation: Retired    Fish farm manager: [employer]  Tobacco Use   Smoking status: Former   Smokeless tobacco: Never  Substance and Sexual Activity   Alcohol use: Yes    Alcohol/week: 18.0 standard drinks    Types: 14 Shots of liquor, 4 Glasses of wine per week   Drug use: No   Sexual activity: Not on file  Other Topics Concern   Not on file  Social History Narrative   Lives at home alone, widowed   Right-handed   Caffeine: rare   Social Determinants of Health   Financial Resource Strain: Low Risk    Difficulty of Paying Living Expenses: Not hard at all  Food Insecurity: No Food Insecurity   Worried About Charity fundraiser in the Last Year: Never true   Arboriculturist in the Last Year: Never true  Transportation Needs: No Transportation Needs   Lack of Transportation (Medical): No   Lack of Transportation (Non-Medical): No  Physical Activity: Inactive   Days of Exercise per Week: 0 days   Minutes of Exercise per Session: 0 min  Stress: No Stress Concern Present   Feeling of Stress : Not at all  Social Connections: Moderately Integrated   Frequency of Communication with Friends and Family: More than three times a week   Frequency of Social Gatherings with Friends and Family: More than three times a week   Attends Religious Services: 1 to 4 times per year   Active Member of [name] or Organizations: Yes   Attends Archivist Meetings: 1 to 4 times per year   Marital Status: Widowed    Tobacco Counseling Counseling given: Not Answered   Clinical Intake:  Pre-visit preparation completed: Yes  Pain : No/denies pain     Nutritional Risks: None Diabetes: No  How often do you need to have someone help you when you read instructions, pamphlets, or other written materials from your doctor or pharmacy?: 1 - Never  Diabetic?  No  Interpreter Needed?: No  Information entered by :: Rolene Arbour LPN   Activities of Daily Living In your present state of health, do you have any difficulty performing the following activities: 05/12/2021  Hearing? N  Vision? N  Difficulty concentrating or making decisions? N  Walking or climbing stairs? N  Dressing or bathing? N  Doing errands, shopping? N  Preparing Food and eating ? N  Using the Toilet? N  In the past six months, have you accidently leaked urine? Y  Comment Wears breifs. Followed by PCP  Do you have problems with loss of bowel control? Y  Comment Followed by PCP  Managing your Medications? N  Managing your Finances? N  Housekeeping or  managing your Housekeeping? N  Some recent data might be hidden    Patient Care Team: [name], MD as PCP - General  Indicate any recent Medical Services you may have received from other than [facility] providers in the past year (date may be approximate).     Assessment:   This is a routine wellness examination for [place].  Hearing/Vision screen Hearing Screening - Comments:: Wears hearing aids. Followed by [facility] Vision Screening - Comments:: Wears reading glasses. Followed Dr Dietrich Pates  Dietary issues and exercise activities discussed: Current Exercise Habits: The patient does not participate in regular exercise at present, Exercise limited by: None identified   Goals Addressed             This Visit's Progress    Increase physical activity         Depression Screen PHQ 2/9 Scores 05/12/2021 04/27/2020 09/16/2014  PHQ - 2 Score 0 0 0  PHQ- 9 Score - 0 -    Fall Risk Fall Risk  05/12/2021 04/27/2020 03/04/2019 11/07/2018 12/09/2015  Falls in the past year? - 0 0 (No Data) No  Comment - - Emmi Telephone Survey: data to providers prior to load Emmi Telephone Survey: data to providers prior to load Emmi Telephone Survey: data to providers prior to load  Number falls in past yr: 0 0 - (No Data) -  Comment - -  - Emmi Telephone Survey Actual Response =  -  Injury with Fall? 0 0 - - -  Risk for fall due to : - Impaired balance/gait - - -  Follow up - Falls evaluation completed;Falls prevention discussed - - -    FALL RISK PREVENTION PERTAINING TO THE HOME:  Any stairs in or around the home? Yes  If so, are there any without handrails? No  Home free of loose throw rugs in walkways, pet beds, electrical cords, etc? Yes  Adequate lighting in your home to reduce risk of falls? Yes   ASSISTIVE DEVICES UTILIZED TO PREVENT FALLS:  Life alert? No  Use of a cane, walker or w/c? No  Grab bars in the bathroom? Yes  Shower chair or bench in shower? Yes  Elevated toilet seat or a handicapped toilet? Yes   TIMED UP AND GO:  Was the test performed? Yes .  Length of time to ambulate 10 feet: 5 sec.   Gait slow and steady without use of assistive device  Cognitive Function:     6CIT Screen 05/12/2021  What Year? 0 points  What month? 0 points  What time? 0 points  Count back from 20 0 points  Months in reverse 0 points  Repeat phrase 0 points  Total Score 0    Immunizations Immunization History  Administered Date(s) Administered   Fluad Quad(high Dose 65+) 12/16/2018   Influenza Split 02/22/2011, 12/25/2012   Influenza Whole 01/05/2009   Influenza, High Dose Seasonal PF 12/28/2014, 12/13/2015, 01/08/2017   Influenza,inj,Quad PF,6+ Mos 03/10/2014   Influenza-Unspecified 02/08/2021   Moderna Covid-19 Vaccine Bivalent Booster 20yrs & up 02/08/2021   Moderna Sars-Covid-2 Vaccination 05/01/2019, 05/29/2019, 01/01/2020   Pneumococcal Conjugate-13 05/29/2016   Pneumococcal Polysaccharide-23 12/16/2018   Zoster, Live 06/01/2008    TDAP status: Due, Education has been provided regarding the importance of this vaccine. Advised may receive this vaccine at local pharmacy or Health Dept. Aware to provide a copy of the vaccination record if obtained from local pharmacy or Health Dept. Verbalized  acceptance and understanding.  Flu Vaccine status:  Up to date  Pneumococcal vaccine status: Up to date  Covid-19 vaccine status: Completed vaccines  Qualifies for Shingles Vaccine? Yes   Zostavax completed No   Shingrix Completed?: No.    Education has been provided regarding the importance of this vaccine. Patient has been advised to call insurance company to determine out of pocket expense if they have not yet received this vaccine. Advised may also receive vaccine at local pharmacy or Health Dept. Verbalized acceptance and understanding.  Screening Tests Health Maintenance  Topic Date Due   Zoster Vaccines- Shingrix (1 of 2) 08/09/2021 (Originally 01/23/1970)   TETANUS/TDAP  05/12/2022 (Originally 11/16/2020)   Pneumonia Vaccine 52+ Years old  Completed   INFLUENZA VACCINE  Completed   COVID-19 Vaccine  Completed   HPV VACCINES  Aged Out    Health Maintenance  There are no preventive care reminders to display for this patient.    Additional Screening:   Vision Screening: Recommended annual ophthalmology exams for early detection of glaucoma and other disorders of the eye. Is the patient up to date with their annual eye exam?  Yes  Who is the provider or what is the name of the office in which the patient attends annual eye exams? Dr Warrick Parisian If pt is not established with a provider, would they like to be referred to a provider to establish care? No .   Dental Screening: Recommended annual dental exams for proper oral hygiene  Community Resource Referral / Chronic Care Management: CRR required this visit?  No   CCM required this visit?  No      Plan:     I have personally reviewed and noted the following in the patients chart:   Medical and social history Use of alcohol, tobacco or illicit drugs  Current medications and supplements including opioid prescriptions. Patient is not currently taking opioid prescriptions. Functional ability and status Nutritional  status Physical activity Advanced directives List of other physicians Hospitalizations, surgeries, and ER visits in previous 12 months Vitals Screenings to include cognitive, depression, and falls Referrals and appointments  In addition, I have reviewed and discussed with patient certain preventive protocols, quality metrics, and best practice recommendations. A written personalized care plan for preventive services as well as general preventive health recommendations were provided to patient.     Criselda Peaches, LPN   08/10/6501   Nurse Notes: None

## 2021-05-12 NOTE — Patient Instructions (Addendum)
Allen Wheeler , Thank you for taking time to come for your Medicare Wellness Visit. I appreciate your ongoing commitment to your health goals. Please review the following plan we discussed and let me know if I can assist you in the future.   These are the goals we discussed:  Goals      Exercise 3x per week (30 min per time)     Increase physical activity        This is a list of the screening recommended for you and due dates:  Health Maintenance  Topic Date Due   Zoster (Shingles) Vaccine (1 of 2) 08/09/2021*   Tetanus Vaccine  05/12/2022*   Pneumonia Vaccine  Completed   Flu Shot  Completed   COVID-19 Vaccine  Completed   HPV Vaccine  Aged Out  *Topic was postponed. The date shown is not the original due date.   Advanced directives: Yes  Conditions/risks identified: None  Next appointment: Follow up in one year for your annual wellness visit.   Preventive Care 61 Years and Older, Male Preventive care refers to lifestyle choices and visits with your health care provider that can promote health and wellness. What does preventive care include? A yearly physical exam. This is also called an annual well check. Dental exams once or twice a year. Routine eye exams. Ask your health care provider how often you should have your eyes checked. Personal lifestyle choices, including: Daily care of your teeth and gums. Regular physical activity. Eating a healthy diet. Avoiding tobacco and drug use. Limiting alcohol use. Practicing safe sex. Taking low doses of aspirin every day. Taking vitamin and mineral supplements as recommended by your health care provider. What happens during an annual well check? The services and screenings done by your health care provider during your annual well check will depend on your age, overall health, lifestyle risk factors, and family history of disease. Counseling  Your health care provider may ask you questions about your: Alcohol use. Tobacco  use. Drug use. Emotional well-being. Home and relationship well-being. Sexual activity. Eating habits. History of falls. Memory and ability to understand (cognition). Work and work Statistician. Screening  You may have the following tests or measurements: Height, weight, and BMI. Blood pressure. Lipid and cholesterol levels. These may be checked every 5 years, or more frequently if you are over 84 years old. Skin check. Lung cancer screening. You may have this screening every year starting at age 86 if you have a 30-pack-year history of smoking and currently smoke or have quit within the past 15 years. Fecal occult blood test (FOBT) of the stool. You may have this test every year starting at age 86. Flexible sigmoidoscopy or colonoscopy. You may have a sigmoidoscopy every 5 years or a colonoscopy every 10 years starting at age 86. Prostate cancer screening. Recommendations will vary depending on your family history and other risks. Hepatitis C blood test. Hepatitis B blood test. Sexually transmitted disease (STD) testing. Diabetes screening. This is done by checking your blood sugar (glucose) after you have not eaten for a while (fasting). You may have this done every 1-3 years. Abdominal aortic aneurysm (AAA) screening. You may need this if you are a current or former smoker. Osteoporosis. You may be screened starting at age 86 if you are at high risk. Talk with your health care provider about your test results, treatment options, and if necessary, the need for more tests. Vaccines  Your health care provider may recommend certain vaccines,  such as: Influenza vaccine. This is recommended every year. Tetanus, diphtheria, and acellular pertussis (Tdap, Td) vaccine. You may need a Td booster every 10 years. Zoster vaccine. You may need this after age 86. Pneumococcal 13-valent conjugate (PCV13) vaccine. One dose is recommended after age 86. Pneumococcal polysaccharide (PPSV23) vaccine.  One dose is recommended after age 86. Talk to your health care provider about which screenings and vaccines you need and how often you need them. This information is not intended to replace advice given to you by your health care provider. Make sure you discuss any questions you have with your health care provider. Document Released: 04/22/2015 Document Revised: 12/14/2015 Document Reviewed: 01/25/2015 Elsevier Interactive Patient Education  2017 [place] Prevention in the Home Falls can cause injuries. They can happen to people of all ages. There are many things you can do to make your home safe and to help prevent falls. What can I do on the outside of my home? Regularly fix the edges of walkways and driveways and fix any cracks. Remove anything that might make you trip as you walk through a door, such as a raised step or threshold. Trim any bushes or trees on the path to your home. Use bright outdoor lighting. Clear any walking paths of anything that might make someone trip, such as rocks or tools. Regularly check to see if handrails are loose or broken. Make sure that both sides of any steps have handrails. Any raised decks and porches should have guardrails on the edges. Have any leaves, snow, or ice cleared regularly. Use sand or salt on walking paths during winter. Clean up any spills in your garage right away. This includes oil or grease spills. What can I do in the bathroom? Use night lights. Install grab bars by the toilet and in the tub and shower. Do not use towel bars as grab bars. Use non-skid mats or decals in the tub or shower. If you need to sit down in the shower, use a plastic, non-slip stool. Keep the floor dry. Clean up any water that spills on the floor as soon as it happens. Remove soap buildup in the tub or shower regularly. Attach bath mats securely with double-sided non-slip rug tape. Do not have throw rugs and other things on the floor that can make  you trip. What can I do in the bedroom? Use night lights. Make sure that you have a light by your bed that is easy to reach. Do not use any sheets or blankets that are too big for your bed. They should not hang down onto the floor. Have a firm chair that has side arms. You can use this for support while you get dressed. Do not have throw rugs and other things on the floor that can make you trip. What can I do in the kitchen? Clean up any spills right away. Avoid walking on wet floors. Keep items that you use a lot in easy-to-reach places. If you need to reach something above you, use a strong step stool that has a grab bar. Keep electrical cords out of the way. Do not use floor polish or wax that makes floors slippery. If you must use wax, use non-skid floor wax. Do not have throw rugs and other things on the floor that can make you trip. What can I do with my stairs? Do not leave any items on the stairs. Make sure that there are handrails on both sides of the stairs and  use them. Fix handrails that are broken or loose. Make sure that handrails are as long as the stairways. Check any carpeting to make sure that it is firmly attached to the stairs. Fix any carpet that is loose or worn. Avoid having throw rugs at the top or bottom of the stairs. If you do have throw rugs, attach them to the floor with carpet tape. Make sure that you have a light switch at the top of the stairs and the bottom of the stairs. If you do not have them, ask someone to add them for you. What else can I do to help prevent falls? Wear shoes that: Do not have high heels. Have rubber bottoms. Are comfortable and fit you well. Are closed at the toe. Do not wear sandals. If you use a stepladder: Make sure that it is fully opened. Do not climb a closed stepladder. Make sure that both sides of the stepladder are locked into place. Ask someone to hold it for you, if possible. Clearly mark and make sure that you can  see: Any grab bars or handrails. First and last steps. Where the edge of each step is. Use tools that help you move around (mobility aids) if they are needed. These include: Canes. Walkers. Scooters. Crutches. Turn on the lights when you go into a dark area. Replace any light bulbs as soon as they burn out. Set up your furniture so you have a clear path. Avoid moving your furniture around. If any of your floors are uneven, fix them. If there are any pets around you, be aware of where they are. Review your medicines with your doctor. Some medicines can make you feel dizzy. This can increase your chance of falling. Ask your doctor what other things that you can do to help prevent falls. This information is not intended to replace advice given to you by your health care provider. Make sure you discuss any questions you have with your health care provider. Document Released: 01/20/2009 Document Revised: 09/01/2015 Document Reviewed: 04/30/2014 Elsevier Interactive Patient Education  2017 [employer].

## 2021-05-15 ENCOUNTER — Encounter: Payer: Self-pay | Admitting: Family Medicine

## 2021-05-15 ENCOUNTER — Ambulatory Visit (INDEPENDENT_AMBULATORY_CARE_PROVIDER_SITE_OTHER): Payer: Medicare Other | Admitting: Family Medicine

## 2021-05-15 VITALS — BP 118/60 | HR 71 | Wt 155.2 lb

## 2021-05-15 DIAGNOSIS — K580 Irritable bowel syndrome with diarrhea: Secondary | ICD-10-CM | POA: Diagnosis not present

## 2021-05-15 NOTE — Progress Notes (Signed)
° °  Subjective:    Patient ID: Allen Wheeler, male    DOB: Dec 02, 1919, 88 y.o.   MRN: [id number]  HPI Here to follow up on IBS which causes him to have loose and urgent stools. This is always worse during the daytime than at night. When we spoke last November I let him try Lomotil, intending for him to take this in the mornings. However he has been taking this only at night, therefore the diarrhea has continued. Still ho pain or nausea.    Review of Systems  Constitutional: Negative.   Respiratory: Negative.    Cardiovascular: Negative.   Gastrointestinal:  Positive for diarrhea. Negative for abdominal distention, abdominal pain, blood in stool, constipation, nausea and vomiting.      Objective:   Physical Exam Constitutional:      Appearance: Normal appearance.  Cardiovascular:     Rate and Rhythm: Normal rate and regular rhythm.     Pulses: Normal pulses.     Heart sounds: Normal heart sounds.  Pulmonary:     Effort: Pulmonary effort is normal.     Breath sounds: Normal breath sounds.  Abdominal:     General: Abdomen is flat. Bowel sounds are normal. There is no distension.     Palpations: Abdomen is soft. There is no mass.     Tenderness: There is no abdominal tenderness. There is no guarding or rebound.     Hernia: No hernia is present.  Neurological:     Mental Status: He is alert.          Assessment & Plan:  IBS. This time I advised him to take 2 tablets of Lomotil at a time TWICE a day. He will report back in 2 weeks. Alysia Penna, MD

## 2021-05-20 ENCOUNTER — Other Ambulatory Visit: Payer: Self-pay | Admitting: Family Medicine

## 2021-06-30 ENCOUNTER — Telehealth: Payer: Self-pay | Admitting: Family Medicine

## 2021-06-30 MED ORDER — LORAZEPAM 1 MG PO TABS
1.0000 mg | ORAL_TABLET | Freq: Every evening | ORAL | 0 refills | Status: DC | PRN
Start: 2021-06-30 — End: 2021-09-13

## 2021-06-30 NOTE — Telephone Encounter (Signed)
Called son [name] to make aware. ?

## 2021-06-30 NOTE — Telephone Encounter (Signed)
Done

## 2021-06-30 NOTE — Telephone Encounter (Signed)
Pt LOV was on 05/15/2021 ?Last refill was done on 12/20/2020 ?Please advise ?

## 2021-06-30 NOTE — Telephone Encounter (Signed)
[name] [name] is calling in a request for LORazepam (ATIVAN) 1 MG tablet [[id number]]  to be sent to a new pharmacy since he's staying with him right now. ? ?The pharmacy is the [facility] on [address], [place], [place] [address]. ? ?Please advise. ?

## 2021-08-22 ENCOUNTER — Telehealth: Payer: Self-pay | Admitting: Family Medicine

## 2021-08-22 DIAGNOSIS — G4762 Sleep related leg cramps: Secondary | ICD-10-CM

## 2021-08-22 MED ORDER — GABAPENTIN 100 MG PO CAPS: 200.0000 mg | ORAL_CAPSULE | Freq: Every day | ORAL | 1 refills | Status: DC

## 2021-08-22 NOTE — Telephone Encounter (Signed)
Called pt son [name] aware refill for Gabapentin 100 mg sent to [facility] in [place].   ?

## 2021-08-22 NOTE — Telephone Encounter (Signed)
Requesting a refill of gabapentin (NEURONTIN) 100 MG capsule be sent to  ?[facility] [id number] [place], [place] Phone:  [phone]  ?Fax:  [phone]  ?  ? ?

## 2021-09-13 ENCOUNTER — Other Ambulatory Visit: Payer: Self-pay | Admitting: Family Medicine

## 2021-09-13 NOTE — Telephone Encounter (Signed)
Last OV- 05/15/2021 Next OV not scheduled.   Can this patient receive a refill?

## 2021-09-21 ENCOUNTER — Other Ambulatory Visit: Payer: Self-pay | Admitting: Family Medicine

## 2021-09-22 ENCOUNTER — Other Ambulatory Visit: Payer: Self-pay | Admitting: Family Medicine

## 2021-09-22 ENCOUNTER — Other Ambulatory Visit: Payer: Self-pay

## 2021-09-23 ENCOUNTER — Other Ambulatory Visit: Payer: Self-pay | Admitting: Family Medicine

## 2021-09-25 ENCOUNTER — Other Ambulatory Visit: Payer: Self-pay | Admitting: Family Medicine

## 2021-09-25 NOTE — Telephone Encounter (Signed)
Pt needs Rx refill LORazepam (ATIVAN) 1 MG tablet to be resent to    [place], [place] Phone:  [phone]  Fax:  [phone]     Instead of the [facility] in [place] because he is back here in [place]. He is asking if you can fill the order as soon as you can because he can't sleep at night. If he can just have 1 or 2 pills while the order is being placed that would be great.   Please advise.

## 2021-09-25 NOTE — Telephone Encounter (Signed)
Contacted the pharmacy([facility]) to deactivated the rx sent on 6/7. Pharmacy switched to [facility].   Please advise

## 2021-09-26 MED ORDER — LORAZEPAM 1 MG PO TABS: 1.0000 mg | ORAL_TABLET | Freq: Every evening | ORAL | 1 refills | Status: DC | PRN

## 2021-09-27 ENCOUNTER — Other Ambulatory Visit: Payer: Self-pay | Admitting: Family Medicine

## 2021-10-19 ENCOUNTER — Other Ambulatory Visit: Payer: Self-pay | Admitting: Family Medicine

## 2021-10-22 ENCOUNTER — Other Ambulatory Visit: Payer: Self-pay | Admitting: Family Medicine

## 2021-10-24 ENCOUNTER — Other Ambulatory Visit: Payer: Self-pay | Admitting: Family Medicine

## 2021-10-26 ENCOUNTER — Telehealth: Payer: Self-pay

## 2021-10-26 NOTE — Telephone Encounter (Signed)
Message sent to pt PCP for advise

## 2021-10-27 MED ORDER — DIPHENOXYLATE-ATROPINE 2.5-0.025 MG PO TABS: ORAL_TABLET | ORAL | 5 refills | Status: AC

## 2021-10-27 NOTE — Addendum Note (Signed)
Addended by: Alysia Penna A on: 10/27/2021 07:42 AM   Modules accepted: Orders

## 2021-10-27 NOTE — Telephone Encounter (Signed)
Done

## 2022-01-18 ENCOUNTER — Other Ambulatory Visit: Payer: Self-pay | Admitting: Family Medicine

## 2022-01-24 ENCOUNTER — Encounter: Payer: Self-pay | Admitting: Family Medicine

## 2022-01-24 ENCOUNTER — Telehealth (INDEPENDENT_AMBULATORY_CARE_PROVIDER_SITE_OTHER): Payer: Medicare Other | Admitting: Family Medicine

## 2022-01-24 DIAGNOSIS — U071 COVID-19: Secondary | ICD-10-CM

## 2022-01-24 LAB — POC COVID19 BINAXNOW: SARS Coronavirus 2 Ag: POSITIVE — AB

## 2022-01-24 LAB — POCT INFLUENZA A/B
Influenza A, POC: NEGATIVE
Influenza B, POC: NEGATIVE

## 2022-01-24 LAB — POCT RAPID STREP A (OFFICE): Rapid Strep A Screen: NEGATIVE

## 2022-01-24 MED ORDER — MOLNUPIRAVIR EUA 200MG CAPSULE: 4.0000 | ORAL_CAPSULE | Freq: Two times a day (BID) | ORAL | 0 refills | Status: DC

## 2022-01-24 NOTE — Addendum Note (Signed)
Addended by: Wyvonne Lenz on: 01/24/2022 11:55 AM   Modules accepted: Orders

## 2022-01-24 NOTE — Progress Notes (Signed)
Subjective:    Patient ID: Allen Wheeler, male    DOB: 08/22/1919, 88 y.o.   MRN: [id number]  HPI Virtual Visit via Video Note  I connected with the patient on 01/24/22 at 10:30 AM EDT by a video enabled telemedicine application and verified that I am speaking with the correct person using two identifiers.  Location patient: home Location provider:work or home office Persons participating in the virtual visit: patient, provider  I discussed the limitations of evaluation and management by telemedicine and the availability of in person appointments. The patient expressed understanding and agreed to proceed.   HPI: Here with his son for a Covid-19 infection. He spent last week in [place] visiting family, and he flew back home yesterday. During the day yesterday he developed a fever to 101 degrees, a ST, and a dry cough. No body aches or SOB. No NVD. He is drinking fluids, and taking Ibuprofen and Robitussin. This morning he tested positive for the Covid virus.    ROS: See pertinent positives and negatives per HPI.  Past Medical History:  Diagnosis Date   Allergy    Anxiety    Chickenpox    Depression    Dizziness, nonspecific    loss of balance last few months   GERD (gastroesophageal reflux disease)    Hearing loss    wears hearing aids   Nephrolithiasis    Normal cardiac stress test 09-20-12   Osteoarthritis     Past Surgical History:  Procedure Laterality Date   CATARACT EXTRACTION  2017   CHOLECYSTECTOMY N/A 02/18/2016   Procedure: LAPAROSCOPIC CHOLECYSTECTOMY WITH POSSIBLE INTRAOPERATIVE CHOLANGIOGRAM;  Surgeon: Mickeal Skinner, MD;  Location: [place];  Service: General;  Laterality: N/A;   ERCP N/A 02/16/2016   Procedure: ENDOSCOPIC RETROGRADE CHOLANGIOPANCREATOGRAPHY (ERCP);  Surgeon: Teena Irani, MD;  Location: [facility] ENDOSCOPY;  Service: Endoscopy;  Laterality: N/A;   HERNIA REPAIR     wears truss   INGUINAL HERNIA REPAIR Right 06/21/2016   Procedure: OPEN  RIGHT INGUINAL HERNIA REPAIR;  Surgeon: [name] Kinsinger, MD;  Location: [facility];  Service: General;  Laterality: Right;   INSERTION OF MESH Right 06/21/2016   Procedure: INSERTION OF MESH;  Surgeon: [name] Kinsinger, MD;  Location: [facility];  Service: General;  Laterality: Right;   MASTOIDECTOMY      Family History  Problem Relation Age of Onset   Heart disease Mother        Pacemaker   Colon cancer Father      Current Outpatient Medications:    albuterol (PROVENTIL HFA;VENTOLIN HFA) 108 (90 Base) MCG/ACT inhaler, Inhale 2 puffs into the lungs every 4 (four) hours as needed for wheezing or shortness of breath., Disp: 1 Inhaler, Rfl: 2   citalopram (CELEXA) 10 MG tablet, TAKE ONE TABLET ONCE DAILY, Disp: 90 tablet, Rfl: 1   dicyclomine (BENTYL) 20 MG tablet, TAKE ONE TABLET BY MOUTH IN THE MORNING, Disp: 30 tablet, Rfl: 5   diphenoxylate-atropine (LOMOTIL) 2.5-0.025 MG tablet, TAKE ONE OR TWO TABLETS BY MOUTH EVERY MORNING, Disp: 60 tablet, Rfl: 5   gabapentin (NEURONTIN) 100 MG capsule, Take 2 capsules (200 mg total) by mouth at bedtime., Disp: 180 capsule, Rfl: 1   hydrOXYzine (VISTARIL) 25 MG capsule, TAKE ONE CAPSULE BY MOUTH EVERY 6 HOURS AS NEEDED FOR ITCHING, Disp: 120 capsule, Rfl: 3   levothyroxine (SYNTHROID) 50 MCG tablet, TAKE 1 TABLET ONCE DAILY., Disp: 90 tablet, Rfl: 3   LORazepam (ATIVAN) 1 MG tablet, Take  1 tablet (1 mg total) by mouth at bedtime as needed., Disp: 90 tablet, Rfl: 1   molnupiravir EUA (LAGEVRIO) 200 mg CAPS capsule, Take 4 capsules (800 mg total) by mouth 2 (two) times daily for 5 days., Disp: 40 capsule, Rfl: 0   Multiple Vitamin (MULTIVITAMIN WITH MINERALS) TABS tablet, Take 1 tablet by mouth daily. Centrum Silver, Disp: , Rfl:    omeprazole (PRILOSEC) 40 MG capsule, TAKE 1 CAPSULE DAILY., Disp: 90 capsule, Rfl: 3   pramipexole (MIRAPEX) 0.125 MG tablet, TAKE TWO TABLETS BY MOUTH AT BEDTIME, Disp: 180 tablet, Rfl: 1   predniSONE (DELTASONE) 10 MG  tablet, TAKE 1 TABLET ONCE DAILY., Disp: 90 tablet, Rfl: 3   tamsulosin (FLOMAX) 0.4 MG CAPS capsule, TAKE TWO CAPSULES BY MOUTH DAILY, Disp: 180 capsule, Rfl: 3   Vitamins/Minerals TABS, Take by mouth., Disp: , Rfl:   EXAM:  VITALS per patient if applicable:  GENERAL: alert, oriented, appears well and in no acute distress  HEENT: atraumatic, conjunttiva clear, no obvious abnormalities on inspection of external nose and ears  NECK: normal movements of the head and neck  LUNGS: on inspection no signs of respiratory distress, breathing rate appears normal, no obvious gross SOB, gasping or wheezing  CV: no obvious cyanosis  MS: moves all visible extremities without noticeable abnormality  PSYCH/NEURO: pleasant and cooperative, no obvious depression or anxiety, speech and thought processing grossly intact  ASSESSMENT AND PLAN: Covid infection, treat with 5 days of Molnupiravir. Recheck as needed.  Alysia Penna, MD  Discussed the following assessment and plan:  No diagnosis found.     I discussed the assessment and treatment plan with the patient. The patient was provided an opportunity to ask questions and all were answered. The patient agreed with the plan and demonstrated an understanding of the instructions.   The patient was advised to call back or seek an in-person evaluation if the symptoms worsen or if the condition fails to improve as anticipated.      Review of Systems     Objective:   Physical Exam        Assessment & Plan:

## 2022-01-28 ENCOUNTER — Inpatient Hospital Stay (HOSPITAL_COMMUNITY)
Admission: EM | Admit: 2022-01-28 | Discharge: 2022-01-30 | DRG: 177 | Disposition: A | Discharge status: Home Health | Payer: Medicare Other | Attending: Family Medicine | Admitting: Family Medicine

## 2022-01-28 ENCOUNTER — Emergency Department (HOSPITAL_COMMUNITY): Payer: Medicare Other

## 2022-01-28 ENCOUNTER — Encounter (HOSPITAL_COMMUNITY): Payer: Self-pay

## 2022-01-28 DIAGNOSIS — M1990 Unspecified osteoarthritis, unspecified site: Secondary | ICD-10-CM | POA: Diagnosis present

## 2022-01-28 DIAGNOSIS — I447 Left bundle-branch block, unspecified: Secondary | ICD-10-CM | POA: Diagnosis present

## 2022-01-28 DIAGNOSIS — J9601 Acute respiratory failure with hypoxia: Secondary | ICD-10-CM | POA: Diagnosis present

## 2022-01-28 DIAGNOSIS — F419 Anxiety disorder, unspecified: Secondary | ICD-10-CM | POA: Diagnosis present

## 2022-01-28 DIAGNOSIS — Z79890 Hormone replacement therapy (postmenopausal): Secondary | ICD-10-CM

## 2022-01-28 DIAGNOSIS — J1282 Pneumonia due to coronavirus disease 2019: Secondary | ICD-10-CM | POA: Diagnosis present

## 2022-01-28 DIAGNOSIS — I441 Atrioventricular block, second degree: Secondary | ICD-10-CM | POA: Diagnosis not present

## 2022-01-28 DIAGNOSIS — Z8249 Family history of ischemic heart disease and other diseases of the circulatory system: Secondary | ICD-10-CM | POA: Diagnosis not present

## 2022-01-28 DIAGNOSIS — N138 Other obstructive and reflux uropathy: Secondary | ICD-10-CM | POA: Diagnosis present

## 2022-01-28 DIAGNOSIS — U071 COVID-19: Secondary | ICD-10-CM | POA: Diagnosis present

## 2022-01-28 DIAGNOSIS — B3749 Other urogenital candidiasis: Secondary | ICD-10-CM | POA: Diagnosis present

## 2022-01-28 DIAGNOSIS — E860 Dehydration: Secondary | ICD-10-CM | POA: Diagnosis present

## 2022-01-28 DIAGNOSIS — B372 Candidiasis of skin and nail: Secondary | ICD-10-CM | POA: Diagnosis not present

## 2022-01-28 DIAGNOSIS — F39 Unspecified mood [affective] disorder: Secondary | ICD-10-CM | POA: Diagnosis not present

## 2022-01-28 DIAGNOSIS — K219 Gastro-esophageal reflux disease without esophagitis: Secondary | ICD-10-CM | POA: Diagnosis present

## 2022-01-28 DIAGNOSIS — N1832 Chronic kidney disease, stage 3b: Secondary | ICD-10-CM | POA: Diagnosis present

## 2022-01-28 DIAGNOSIS — Z79899 Other long term (current) drug therapy: Secondary | ICD-10-CM

## 2022-01-28 DIAGNOSIS — H9190 Unspecified hearing loss, unspecified ear: Secondary | ICD-10-CM | POA: Diagnosis present

## 2022-01-28 DIAGNOSIS — Z7952 Long term (current) use of systemic steroids: Secondary | ICD-10-CM

## 2022-01-28 DIAGNOSIS — R001 Bradycardia, unspecified: Secondary | ICD-10-CM | POA: Diagnosis present

## 2022-01-28 DIAGNOSIS — E039 Hypothyroidism, unspecified: Secondary | ICD-10-CM | POA: Diagnosis present

## 2022-01-28 DIAGNOSIS — Z87891 Personal history of nicotine dependence: Secondary | ICD-10-CM

## 2022-01-28 DIAGNOSIS — N179 Acute kidney failure, unspecified: Secondary | ICD-10-CM | POA: Diagnosis not present

## 2022-01-28 DIAGNOSIS — F32A Depression, unspecified: Secondary | ICD-10-CM | POA: Diagnosis present

## 2022-01-28 DIAGNOSIS — Z800 Family history of malignant neoplasm of digestive organs: Secondary | ICD-10-CM | POA: Diagnosis not present

## 2022-01-28 DIAGNOSIS — N401 Enlarged prostate with lower urinary tract symptoms: Secondary | ICD-10-CM | POA: Diagnosis present

## 2022-01-28 DIAGNOSIS — N289 Disorder of kidney and ureter, unspecified: Secondary | ICD-10-CM

## 2022-01-28 LAB — SARS CORONAVIRUS 2 BY RT PCR: SARS Coronavirus 2 by RT PCR: POSITIVE — AB

## 2022-01-28 LAB — TROPONIN I (HIGH SENSITIVITY): Troponin I (High Sensitivity): 33 ng/L — ABNORMAL HIGH (ref <18)

## 2022-01-28 LAB — CBC
HCT: 31.2 % — ABNORMAL LOW (ref 39.0–52.0)
Hemoglobin: 10.8 g/dL — ABNORMAL LOW (ref 13.0–17.0)
MCH: 31.9 pg (ref 26.0–34.0)
MCHC: 34.6 g/dL (ref 30.0–36.0)
MCV: 92 fL (ref 80.0–100.0)
Platelets: 158 10*3/uL (ref 150–400)
RBC: 3.39 MIL/uL — ABNORMAL LOW (ref 4.22–5.81)
RDW: 14.1 % (ref 11.5–15.5)
WBC: 6.1 10*3/uL (ref 4.0–10.5)
nRBC: 0 % (ref 0.0–0.2)

## 2022-01-28 LAB — BRAIN NATRIURETIC PEPTIDE: B Natriuretic Peptide: 304.3 pg/mL — ABNORMAL HIGH (ref 0.0–100.0)

## 2022-01-28 LAB — I-STAT CHEM 8, ED
BUN: 41 mg/dL — ABNORMAL HIGH (ref 8–23)
Calcium, Ion: 1.07 mmol/L — ABNORMAL LOW (ref 1.15–1.40)
Chloride: 106 mmol/L (ref 98–111)
Creatinine, Ser: 1.8 mg/dL — ABNORMAL HIGH (ref 0.61–1.24)
Glucose, Bld: 99 mg/dL (ref 70–99)
HCT: 31 % — ABNORMAL LOW (ref 39.0–52.0)
Hemoglobin: 10.5 g/dL — ABNORMAL LOW (ref 13.0–17.0)
Potassium: 3.8 mmol/L (ref 3.5–5.1)
Sodium: 135 mmol/L (ref 135–145)
TCO2: 19 mmol/L — ABNORMAL LOW (ref 22–32)

## 2022-01-28 LAB — I-STAT ARTERIAL BLOOD GAS, ED
Acid-base deficit: 8 mmol/L — ABNORMAL HIGH (ref 0.0–2.0)
Bicarbonate: 15 mmol/L — ABNORMAL LOW (ref 20.0–28.0)
Calcium, Ion: 1.17 mmol/L (ref 1.15–1.40)
HCT: 40 % (ref 39.0–52.0)
Hemoglobin: 13.6 g/dL (ref 13.0–17.0)
O2 Saturation: 98 %
Patient temperature: 98.3
Potassium: 3.5 mmol/L (ref 3.5–5.1)
Sodium: 135 mmol/L (ref 135–145)
TCO2: 16 mmol/L — ABNORMAL LOW (ref 22–32)
pCO2 arterial: 24.3 mmHg — ABNORMAL LOW (ref 32–48)
pH, Arterial: 7.399 (ref 7.35–7.45)
pO2, Arterial: 100 mmHg (ref 83–108)

## 2022-01-28 LAB — LACTIC ACID, PLASMA: Lactic Acid, Venous: 1.2 mmol/L (ref 0.5–1.9)

## 2022-01-28 LAB — TSH: TSH: 1.265 u[IU]/mL (ref 0.350–4.500)

## 2022-01-28 MED ORDER — ALBUTEROL SULFATE HFA 108 (90 BASE) MCG/ACT IN AERS: 2.0000 | INHALATION_SPRAY | RESPIRATORY_TRACT | Status: DC | PRN

## 2022-01-28 MED ORDER — GABAPENTIN 100 MG PO CAPS
200.0000 mg | ORAL_CAPSULE | Freq: Every day | ORAL | Status: DC
  Administered 2022-01-28 – 2022-01-29 (×2): 200 mg via ORAL
  Filled 2022-01-28 (×2): qty 2

## 2022-01-28 MED ORDER — BISACODYL 5 MG PO TBEC: 5.0000 mg | DELAYED_RELEASE_TABLET | Freq: Every day | ORAL | Status: DC | PRN

## 2022-01-28 MED ORDER — FLUCONAZOLE 100MG IVPB
100.0000 mg | Freq: Once | INTRAVENOUS | Status: AC
  Administered 2022-01-28: 100 mg via INTRAVENOUS
  Filled 2022-01-28: qty 50

## 2022-01-28 MED ORDER — LACTATED RINGERS IV SOLN: INTRAVENOUS | Status: AC

## 2022-01-28 MED ORDER — SODIUM CHLORIDE 0.9% FLUSH
3.0000 mL | Freq: Two times a day (BID) | INTRAVENOUS | Status: DC
  Administered 2022-01-28 – 2022-01-29 (×3): 3 mL via INTRAVENOUS

## 2022-01-28 MED ORDER — PANTOPRAZOLE SODIUM 40 MG PO TBEC
40.0000 mg | DELAYED_RELEASE_TABLET | Freq: Every day | ORAL | Status: DC
  Administered 2022-01-29 – 2022-01-30 (×2): 40 mg via ORAL
  Filled 2022-01-28 (×2): qty 1

## 2022-01-28 MED ORDER — OXYCODONE HCL 5 MG PO TABS: 5.0000 mg | ORAL_TABLET | ORAL | Status: DC | PRN

## 2022-01-28 MED ORDER — IPRATROPIUM-ALBUTEROL 0.5-2.5 (3) MG/3ML IN SOLN
3.0000 mL | Freq: Once | RESPIRATORY_TRACT | Status: AC
  Administered 2022-01-28: 3 mL via RESPIRATORY_TRACT
  Filled 2022-01-28: qty 3

## 2022-01-28 MED ORDER — LEVOTHYROXINE SODIUM 50 MCG PO TABS
50.0000 ug | ORAL_TABLET | Freq: Every day | ORAL | Status: DC
  Administered 2022-01-29 – 2022-01-30 (×2): 50 ug via ORAL
  Filled 2022-01-28 (×2): qty 1

## 2022-01-28 MED ORDER — POLYETHYLENE GLYCOL 3350 17 G PO PACK: 17.0000 g | PACK | Freq: Every day | ORAL | Status: DC | PRN

## 2022-01-28 MED ORDER — ZINC SULFATE 220 (50 ZN) MG PO CAPS
220.0000 mg | ORAL_CAPSULE | Freq: Every day | ORAL | Status: DC
  Administered 2022-01-28 – 2022-01-30 (×3): 220 mg via ORAL
  Filled 2022-01-28 (×3): qty 1

## 2022-01-28 MED ORDER — MOLNUPIRAVIR EUA 200MG CAPSULE
4.0000 | ORAL_CAPSULE | Freq: Two times a day (BID) | ORAL | Status: DC
  Administered 2022-01-28 – 2022-01-30 (×4): 800 mg via ORAL
  Filled 2022-01-28: qty 4

## 2022-01-28 MED ORDER — FLUCONAZOLE 100 MG PO TABS
100.0000 mg | ORAL_TABLET | Freq: Every day | ORAL | Status: DC
  Administered 2022-01-29 – 2022-01-30 (×2): 100 mg via ORAL
  Filled 2022-01-28 (×2): qty 1

## 2022-01-28 MED ORDER — ACETAMINOPHEN 325 MG PO TABS: 650.0000 mg | ORAL_TABLET | Freq: Four times a day (QID) | ORAL | Status: DC | PRN

## 2022-01-28 MED ORDER — GUAIFENESIN-DM 100-10 MG/5ML PO SYRP: 10.0000 mL | ORAL_SOLUTION | ORAL | Status: DC | PRN

## 2022-01-28 MED ORDER — NYSTATIN 100000 UNIT/GM EX POWD
Freq: Three times a day (TID) | CUTANEOUS | Status: DC
  Filled 2022-01-28: qty 15

## 2022-01-28 MED ORDER — ONDANSETRON HCL 4 MG PO TABS: 4.0000 mg | ORAL_TABLET | Freq: Four times a day (QID) | ORAL | Status: DC | PRN

## 2022-01-28 MED ORDER — CITALOPRAM HYDROBROMIDE 20 MG PO TABS
10.0000 mg | ORAL_TABLET | Freq: Every morning | ORAL | Status: DC
  Administered 2022-01-29 – 2022-01-30 (×2): 10 mg via ORAL
  Filled 2022-01-28 (×2): qty 1

## 2022-01-28 MED ORDER — TAMSULOSIN HCL 0.4 MG PO CAPS
0.8000 mg | ORAL_CAPSULE | Freq: Every day | ORAL | Status: DC
  Administered 2022-01-28 – 2022-01-29 (×2): 0.8 mg via ORAL
  Filled 2022-01-28 (×2): qty 2

## 2022-01-28 MED ORDER — LORAZEPAM 1 MG PO TABS: 1.0000 mg | ORAL_TABLET | Freq: Every evening | ORAL | Status: DC | PRN

## 2022-01-28 MED ORDER — SODIUM CHLORIDE 0.9% FLUSH
3.0000 mL | Freq: Two times a day (BID) | INTRAVENOUS | Status: DC
  Administered 2022-01-29: 3 mL via INTRAVENOUS

## 2022-01-28 MED ORDER — PREDNISONE 50 MG PO TABS: 50.0000 mg | ORAL_TABLET | Freq: Every day | ORAL | Status: DC

## 2022-01-28 MED ORDER — SODIUM CHLORIDE 0.9 % IV SOLN: INTRAVENOUS | Status: DC | PRN

## 2022-01-28 MED ORDER — VITAMIN C 500 MG PO TABS
500.0000 mg | ORAL_TABLET | Freq: Every day | ORAL | Status: DC
  Administered 2022-01-28 – 2022-01-30 (×3): 500 mg via ORAL
  Filled 2022-01-28 (×3): qty 1

## 2022-01-28 MED ORDER — DOCUSATE SODIUM 100 MG PO CAPS
100.0000 mg | ORAL_CAPSULE | Freq: Two times a day (BID) | ORAL | Status: DC
  Administered 2022-01-28 (×2): 100 mg via ORAL
  Filled 2022-01-28 (×4): qty 1

## 2022-01-28 MED ORDER — ONDANSETRON HCL 4 MG/2ML IJ SOLN: 4.0000 mg | Freq: Four times a day (QID) | INTRAMUSCULAR | Status: DC | PRN

## 2022-01-28 MED ORDER — POTASSIUM CHLORIDE 10 MEQ/100ML IV SOLN
10.0000 meq | INTRAVENOUS | Status: AC
  Administered 2022-01-28 (×2): 10 meq via INTRAVENOUS
  Filled 2022-01-28 (×2): qty 100

## 2022-01-28 MED ORDER — METHYLPREDNISOLONE SODIUM SUCC 40 MG IJ SOLR
35.0000 mg | Freq: Two times a day (BID) | INTRAMUSCULAR | Status: DC
  Administered 2022-01-28 – 2022-01-30 (×4): 35 mg via INTRAVENOUS
  Filled 2022-01-28 (×3): qty 1

## 2022-01-28 MED ORDER — FLEET ENEMA 7-19 GM/118ML RE ENEM: 1.0000 | ENEMA | Freq: Once | RECTAL | Status: DC | PRN

## 2022-01-28 MED ORDER — SODIUM CHLORIDE 0.9% FLUSH
3.0000 mL | INTRAVENOUS | Status: DC | PRN
  Administered 2022-01-29: 3 mL via INTRAVENOUS

## 2022-01-28 MED ORDER — POTASSIUM CHLORIDE CRYS ER 20 MEQ PO TBCR
40.0000 meq | EXTENDED_RELEASE_TABLET | Freq: Once | ORAL | Status: AC
  Administered 2022-01-28: 40 meq via ORAL
  Filled 2022-01-28: qty 2

## 2022-01-28 MED ORDER — LACTATED RINGERS IV BOLUS
1000.0000 mL | Freq: Once | INTRAVENOUS | Status: AC
  Administered 2022-01-28: 1000 mL via INTRAVENOUS

## 2022-01-28 MED ORDER — HYDROCOD POLI-CHLORPHE POLI ER 10-8 MG/5ML PO SUER: 5.0000 mL | Freq: Two times a day (BID) | ORAL | Status: DC | PRN

## 2022-01-28 MED ORDER — ENOXAPARIN SODIUM 30 MG/0.3ML IJ SOSY
30.0000 mg | PREFILLED_SYRINGE | INTRAMUSCULAR | Status: DC
  Administered 2022-01-28 – 2022-01-29 (×2): 30 mg via SUBCUTANEOUS
  Filled 2022-01-28 (×2): qty 0.3

## 2022-01-28 NOTE — ED Triage Notes (Signed)
Pt BIB by [facility] drom home Pt lives at home with son. Family reports pt tested positive on Wed and they have been managing symptoms at home. Yesterday he appeared to be feeling better however this morning when pt woke up family reported that he was lethargic and slow to respond. Pt is hard of hearing at baseline. Pt received 1000 of LR PTA.  BP 156/96 HR 38-70 RR 26 CBG 123

## 2022-01-28 NOTE — H&P (Signed)
History and Physical    Patient: Allen Wheeler BJY:[id number] DOB: May 06, 1919 DOA: 01/28/2022 DOS: the patient was seen and examined on 01/28/2022 PCP: [name], MD  Patient coming from: Home - lives alone; NOK: Landry Mellow [place], [phone]   Chief Complaint: Worsening COVID infection  HPI: Allen Wheeler is a 88 y.o. male without significant medical history presenting with COVID infection.  His son and daughter-in-law are present with him and provide the history because the patient can't hear anything despite hearing aids.  His son reports that they went to [facility] last week to celebrate his 89th birthday; his son showed me a video of the patient surrounded by folks singing Happy Birthday on 10/16.  He returned home and developed cough, congestion, fatigue starting 10/18.  He had a COVID test that was positive.  He has mostly been weak, fatigued, with poor PO intake.  His son noticed some SOB today but mostly he was just too weak to move around at home.  He has been taking molnupiravir, although his son inadvertently gave 1/2 dose for the first 2 days.  His biggest frustration is scrotal erythema and pain.    ER Course:  COVID with complications.  Went to [place] for his bday and back with cough, SOB.  Tmax 101.  Not eating/drinking.  Lethargic, cough.  Fluctuating HR, in 30s.  Has AKI.  90% with EMS, on 4L -> 2L.  Treated with molnupiravir by PCP.       Review of Systems: As mentioned in the history of present illness. All other systems reviewed and are negative. Past Medical History:  Diagnosis Date   Allergy    Anxiety    Chickenpox    Depression    Dizziness, nonspecific    loss of balance last few months   GERD (gastroesophageal reflux disease)    Hearing loss    wears hearing aids   Nephrolithiasis    Normal cardiac stress test 09-20-12   Osteoarthritis    Past Surgical History:  Procedure Laterality Date   CATARACT EXTRACTION  2017   CHOLECYSTECTOMY N/A  02/18/2016   Procedure: LAPAROSCOPIC CHOLECYSTECTOMY WITH POSSIBLE INTRAOPERATIVE CHOLANGIOGRAM;  Surgeon: Mickeal Skinner, MD;  Location: [place] [facility];  Service: General;  Laterality: N/A;   ERCP N/A 02/16/2016   Procedure: ENDOSCOPIC RETROGRADE CHOLANGIOPANCREATOGRAPHY (ERCP);  Surgeon: Teena Irani, MD;  Location: [facility];  Service: Endoscopy;  Laterality: N/A;   HERNIA REPAIR     wears truss   INGUINAL HERNIA REPAIR Right 06/21/2016   Procedure: OPEN RIGHT INGUINAL HERNIA REPAIR;  Surgeon: [name] Kinsinger, MD;  Location: [facility];  Service: General;  Laterality: Right;   INSERTION OF MESH Right 06/21/2016   Procedure: INSERTION OF MESH;  Surgeon: [name] Kinsinger, MD;  Location: [facility];  Service: General;  Laterality: Right;   MASTOIDECTOMY     Social History:  reports that he has quit smoking. He has never used smokeless tobacco. He reports current alcohol use of about 18.0 standard drinks of alcohol per week. He reports that he does not use drugs.  No Known Allergies  Family History  Problem Relation Age of Onset   Heart disease Mother        Pacemaker   Colon cancer Father     Prior to Admission medications   Medication Sig Start Date End Date Taking? Authorizing Provider  albuterol (PROVENTIL HFA;VENTOLIN HFA) 108 (90 Base) MCG/ACT inhaler Inhale 2 puffs into the lungs every 4 (four) hours as  needed for wheezing or shortness of breath. 04/29/18   [name], MD  citalopram (CELEXA) 10 MG tablet TAKE ONE TABLET ONCE DAILY 09/22/21   [name], MD  dicyclomine (BENTYL) 20 MG tablet TAKE ONE TABLET BY MOUTH IN THE MORNING 12/26/20   [name], MD  diphenoxylate-atropine (LOMOTIL) 2.5-0.025 MG tablet TAKE ONE OR TWO TABLETS BY MOUTH EVERY MORNING 10/27/21   [name], MD  gabapentin (NEURONTIN) 100 MG capsule Take 2 capsules (200 mg total) by mouth at bedtime. 08/22/21   [name], MD  hydrOXYzine (VISTARIL) 25 MG capsule TAKE ONE CAPSULE BY MOUTH EVERY 6  HOURS AS NEEDED FOR ITCHING 09/27/21   [name], MD  levothyroxine (SYNTHROID) 50 MCG tablet TAKE 1 TABLET ONCE DAILY. 01/10/21   [name], MD  LORazepam (ATIVAN) 1 MG tablet Take 1 tablet (1 mg total) by mouth at bedtime as needed. 09/26/21   [name], MD  molnupiravir EUA (LAGEVRIO) 200 mg CAPS capsule Take 4 capsules (800 mg total) by mouth 2 (two) times daily for 5 days. 01/24/22 01/29/22  [name], MD  Multiple Vitamin (MULTIVITAMIN WITH MINERALS) TABS tablet Take 1 tablet by mouth daily. Centrum Silver    [provider]  omeprazole (PRILOSEC) 40 MG capsule TAKE 1 CAPSULE DAILY. 01/10/21   [name], MD  pramipexole (MIRAPEX) 0.125 MG tablet TAKE TWO TABLETS BY MOUTH AT BEDTIME 01/19/22   [name], MD  predniSONE (DELTASONE) 10 MG tablet TAKE 1 TABLET ONCE DAILY. 04/12/21   [name], MD  tamsulosin (FLOMAX) 0.4 MG CAPS capsule TAKE TWO CAPSULES BY MOUTH DAILY 03/07/21   [name], MD  Vitamins/Minerals TABS Take by mouth.    [provider]    Physical Exam: Vitals:   01/28/22 1130 01/28/22 1143 01/28/22 1530 01/28/22 1615  BP: 99/64     Pulse: 72  65 64  Resp: 17  (!) 23 (!) 21  Temp:  98.1 F (36.7 C)    TempSrc:  Oral    SpO2: 98%  96% 96%   General:  Appears calm and comfortable and is in NAD, wearing [place] O2 but it was turned to 0 at the time of my evaluation with preservation of O2 sats Eyes:  PERRL, EOMI, normal lids, iris ENT:   very hard of hearing despite hearing aids, grossly normal lips & tongue, mmm Neck:  no LAD, masses or thyromegaly Cardiovascular:  RRR, no m/r/g. No LE edema.  Respiratory:   CTA bilaterally with no wheezes/rales/rhonchi.  Normal respiratory effort. Abdomen:  soft, NT, ND GU: marked scrotal and penile erythema with thin superficial coating Skin:  no rash or induration seen on limited exam other than penile/scrotal rash as above Musculoskeletal:  grossly normal tone BUE/BLE, good ROM, no bony  abnormality Psychiatric:  blunted mood and affect, speech fluent and appropriate, AOx3 Neurologic:  CN 2-12 grossly intact, moves all extremities in coordinated fashion   Radiological Exams on Admission: Independently reviewed - see discussion in A/P where applicable  DG Chest Portable 1 View  Result Date: 01/28/2022 CLINICAL DATA:  One 88 year old male with history of shortness of breath. EXAM: PORTABLE CHEST 1 VIEW COMPARISON:  Chest x-ray 04/29/2018. FINDINGS: Lung volumes are low. Coarse interstitial markings are again noted throughout the mid to lower lungs bilaterally, suggesting a background of interstitial lung disease. No consolidative airspace disease. No pleural effusions. No pneumothorax. No evidence of pulmonary edema.  Heart size is upper limits of normal. Upper mediastinal contours are within normal limits allowing for patient positioning. Atherosclerotic calcifications are noted in the thoracic aorta. IMPRESSION: 1. Low lung volumes with an appearance suggestive of a background of interstitial lung disease. This could be further evaluated with follow-up nonemergent high-resolution chest CT if clinically appropriate. No definite acute findings are noted on today's examination. 2. Aortic atherosclerosis. Electronically Signed   By: Vinnie Langton M.D.   On: 01/28/2022 08:46    EKG: Independently reviewed.  Sinus bradycardia with rate 42; LBBB with no evidence of acute ischemia   Labs on Admission: I have personally reviewed the available labs and imaging studies at the time of the admission.  Pertinent labs:    ABG: 7.399/24.3/100/15 BUN 41/Creatinine 1.8; 57/1.79/34 on 09/09/20 BNP 304.3 HS troponin 33 Lactate 1.2 WBC 6.1 Hgb 10.8 COVID POSITIVE today and 10/18 Blood cultures pending   Assessment and Plan: Principal Problem:   COVID-19 virus infection Active Problems:   BPH with urinary obstruction   Hypothyroidism   Renal impairment   Genitourinary infection,  candidal    COVID-19 Infection -Patient with presenting with SOB, hypoxia to 90% with EMS and 92% here, generalized weakness/fatigue, and cough -He was diagnosed with COVID on 10/18, still positive -Mild anorexia noted without the presence of other GI symptoms -The patient has comorbidities which may increase the risk for ARDS/MODS including: age -Pertinent labs concerning for COVID include normal CBC; increased BUN/Creatinine; other COVID labs are pending -CXR with multifocal opacities which may be c/w COVID -Will not treat with broad-spectrum antibiotics if procalcitonin <0.5 -Will admit for further evaluation, close monitoring, and treatment -Monitor on telemetry x at least 24 hours -At this time, will attempt to avoid use of aerosolized medications and use HFAs instead -Will order steroids and complete molnupiravir; he appears to be on long-term steroids for uncertain etiology -Will attempt to maintain euvolemia to a net negative fluid status -PT/OT consults -Encourage mobilization/ambulation as much as possible -Patient was seen wearing full PPE including: gown, gloves, N95  Renal impairment -Patient with current creatinine 1.8 -Prior creatinine 18 months ago was more mild but it was similar to today in 09/2020 -Likely CKD although there may be an acute component -Will gently hydrate but given importance of euvolemia in the setting of COVID - will limit  Candidal infection -Severe groin candidiasis -Will give IV fluconazole x 1 and then PO fluconazole starting tomorrow -Will also add nystatin powder  BPH -Continue tamsulosin  Hypothyroidism -Continue Synthroid  Mood d/o -Continue citalopram, Ativan qhs  GOC -Son reports that patient will absolutely not agree to intubation but would be ok with CPR otherwise -It is reasonable to continue to address this, as CPR in a 87 year old patient is highly unlikely to have a favorable outcome     Advance Care Planning:   Code  Status: Partial Code   Consults: PT/OT  DVT Prophylaxis: Lovenox  Family Communication: Son and daughter-in-law were present throughout evaluation  Severity of Illness: The appropriate patient status for this patient is INPATIENT. Inpatient status is judged to be reasonable and necessary in order to provide the required intensity of service to ensure the patient's safety. The patient's presenting symptoms, physical exam findings, and initial radiographic and laboratory data in the context of their chronic comorbidities is felt to place them at high risk for further clinical deterioration. Furthermore, it is not anticipated that the patient will be medically stable for discharge from the hospital within 2 midnights  of admission.   * I certify that at the point of admission it is my clinical judgment that the patient will require inpatient hospital care spanning beyond 2 midnights from the point of admission due to high intensity of service, high risk for further deterioration and high frequency of surveillance required.*  Author: Karmen Bongo, MD 01/28/2022 4:33 PM  For on call review [email or web address].

## 2022-01-28 NOTE — ED Notes (Signed)
Patient heartrate in the 30s. MD made aware. No further interventions at this time, will continue to monitor.

## 2022-01-28 NOTE — Progress Notes (Addendum)
Overnight progress note  Notified by RN that patient has been bradycardic with heart rate as low as 30s, nonsustained.  Telemetry had called RN to notify her that it was second-degree AV block Mobitz type II.  Per RN, patient is currently in sinus rhythm with heart rate in the 50s to 60s.  Patient has remained asymptomatic.  Blood pressure 135/84.  Patient is not on any AV nodal blocking agents.  EKG showing sinus bradycardia, left bundle branch block is not new and seen on prior EKG from 2017 as well.  I have reviewed the EKG with cardiologist Dr. Kalman Shan, he is planning on reviewing the patient's telemetry strips.  Not recommending checking troponin as patient is not having chest pain.  TSH ordered. Will continue to monitor very closely.  Addendum/update 01/28/2022 at 10:16 PM: Dr. Kalman Shan feels that the patient's telemetry strips are consistent with second-degree AV block Morbiz type I (Wenckebach).  Since patient is asymptomatic, no acute intervention recommended.

## 2022-01-28 NOTE — Plan of Care (Signed)
  Problem: Education: Goal: Knowledge of risk factors and measures for prevention of condition will improve Outcome: Progressing   

## 2022-01-28 NOTE — ED Provider Notes (Signed)
[facility]ERGENCY DEPARTMENT Provider Note   CSN: [id number] Arrival date & time: 01/28/22  0744     History  Chief Complaint  Patient presents with   Weakness    Allen Wheeler is a 88 y.o. male with past medical history significant for anxiety, depression, acid reflux, arthritis, advanced age, hearing loss, without documented history of hypertension or other cardiac problems who presents with concern for drowsiness, lethargy, slow to respond this morning.  He tested positive for COVID on Wednesday, family has been managing his symptoms at home.  He reportedly was feeling somewhat better yesterday, but was lethargic and slow to respond this morning.  No previous history of COPD, asthma, tobacco abuse.   Weakness      Home Medications Prior to Admission medications   Medication Sig Start Date End Date Taking? Authorizing Provider  citalopram (CELEXA) 10 MG tablet TAKE ONE TABLET ONCE DAILY Patient taking differently: Take 10 mg by mouth in the morning. 09/22/21  Yes [name], MD  diphenoxylate-atropine (LOMOTIL) 2.5-0.025 MG tablet TAKE ONE OR TWO TABLETS BY MOUTH EVERY MORNING Patient taking differently: Take 1-2 tablets by mouth daily as needed for diarrhea or loose stools. 10/27/21  Yes [name], MD  gabapentin (NEURONTIN) 100 MG capsule Take 2 capsules (200 mg total) by mouth at bedtime. 08/22/21  Yes [name], MD  hydrOXYzine (VISTARIL) 25 MG capsule TAKE ONE CAPSULE BY MOUTH EVERY 6 HOURS AS NEEDED FOR ITCHING Patient taking differently: Take 25 mg by mouth every 6 (six) hours as needed for itching. 09/27/21  Yes [name], MD  levothyroxine (SYNTHROID) 50 MCG tablet TAKE 1 TABLET ONCE DAILY. Patient taking differently: Take 50 mcg by mouth daily before breakfast. 01/10/21  Yes [name], MD  LORazepam (ATIVAN) 1 MG tablet Take 1 tablet (1 mg total) by mouth at bedtime as needed. Patient taking differently: Take 1 mg by mouth  at bedtime as needed for anxiety or sleep. 09/26/21  Yes [name], MD  molnupiravir EUA (LAGEVRIO) 200 mg CAPS capsule Take 4 capsules (800 mg total) by mouth 2 (two) times daily for 5 days. 01/24/22 01/29/22 Yes [name], MD  Multiple Vitamins-Minerals (CENTRUM SILVER ADULT 50+) TABS Take 1 tablet by mouth in the morning.   Yes [provider]  omeprazole (PRILOSEC) 40 MG capsule TAKE 1 CAPSULE DAILY. Patient taking differently: Take 40 mg by mouth in the morning. 01/10/21  Yes [name], MD  predniSONE (DELTASONE) 10 MG tablet TAKE 1 TABLET ONCE DAILY. Patient taking differently: Take 10 mg by mouth in the morning. 04/12/21  Yes [name], MD  tamsulosin (FLOMAX) 0.4 MG CAPS capsule TAKE TWO CAPSULES BY MOUTH DAILY Patient taking differently: Take 0.8 mg by mouth at bedtime. 03/07/21  Yes [name], MD  pramipexole (MIRAPEX) 0.125 MG tablet TAKE TWO TABLETS BY MOUTH AT BEDTIME Patient not taking: Reported on 01/28/2022 01/19/22   [name], MD      Allergies    Patient has no known allergies.    Review of Systems   Review of Systems  Neurological:  Positive for weakness.  All other systems reviewed and are negative.   Physical Exam Updated Vital Signs BP 99/64   Pulse 72   Temp 98.1 F (36.7 C) (Oral)   Resp 17   SpO2 98%  Physical Exam Vitals and nursing note reviewed.  Constitutional:      General: He  is not in acute distress.    Appearance: Normal appearance. He is ill-appearing.  HENT:     Head: Normocephalic and atraumatic.  Eyes:     General:        Right eye: No discharge.        Left eye: No discharge.  Cardiovascular:     Rate and Rhythm: Normal rate. Rhythm irregular.     Heart sounds: No murmur heard.    No friction rub. No gallop.  Pulmonary:     Comments: Patient with some wheezing, rhonchi, but moving air through both lung fields Abdominal:     General: Bowel sounds are normal.     Palpations: Abdomen is soft.   Skin:    General: Skin is warm and dry.     Capillary Refill: Capillary refill takes less than 2 seconds.  Neurological:     Mental Status: He is alert and oriented to person, place, and time.  Psychiatric:        Mood and Affect: Mood normal.        Behavior: Behavior normal.     ED Results / Procedures / Treatments   Labs (all labs ordered are listed, but only abnormal results are displayed) Labs Reviewed  SARS CORONAVIRUS 2 BY RT PCR - Abnormal; Notable for the following components:      Result Value   SARS Coronavirus 2 by RT PCR POSITIVE (*)    All other components within normal limits  CBC - Abnormal; Notable for the following components:   RBC 3.39 (*)    Hemoglobin 10.8 (*)    HCT 31.2 (*)    All other components within normal limits  BRAIN NATRIURETIC PEPTIDE - Abnormal; Notable for the following components:   B Natriuretic Peptide 304.3 (*)    All other components within normal limits  I-STAT CHEM 8, ED - Abnormal; Notable for the following components:   BUN 41 (*)    Creatinine, Ser 1.80 (*)    Calcium, Ion 1.07 (*)    TCO2 19 (*)    Hemoglobin 10.5 (*)    HCT 31.0 (*)    All other components within normal limits  I-STAT ARTERIAL BLOOD GAS, ED - Abnormal; Notable for the following components:   pCO2 arterial 24.3 (*)    Bicarbonate 15.0 (*)    TCO2 16 (*)    Acid-base deficit 8.0 (*)    All other components within normal limits  TROPONIN I (HIGH SENSITIVITY) - Abnormal; Notable for the following components:   Troponin I (High Sensitivity) 33 (*)    All other components within normal limits  CULTURE, BLOOD (ROUTINE X 2)  CULTURE, BLOOD (ROUTINE X 2)  LACTIC ACID, PLASMA  C-REACTIVE PROTEIN  COMPREHENSIVE METABOLIC PANEL  D-DIMER, QUANTITATIVE  FERRITIN  FIBRINOGEN  LACTATE DEHYDROGENASE  PROCALCITONIN  TROPONIN I (HIGH SENSITIVITY)    EKG EKG Interpretation  Date/Time:  Sunday January 28 2022 07:56:01 EDT Ventricular Rate:  42 PR  Interval:  167 QRS Duration: 140 QT Interval:  430 QTC Calculation: 360 R Axis:   4 Text Interpretation: Sinus rhythm Supraventricular bigeminy Left bundle branch block similar to prevous except rate slower Confirmed by Charlesetta Shanks [phone]) on 01/28/2022 9:43:10 AM  Radiology DG Chest Portable 1 View  Result Date: 01/28/2022 CLINICAL DATA:  One 88 year old male with history of shortness of breath. EXAM: PORTABLE CHEST 1 VIEW COMPARISON:  Chest x-ray 04/29/2018. FINDINGS: Lung volumes are low. Coarse interstitial markings are again noted throughout  the mid to lower lungs bilaterally, suggesting a background of interstitial lung disease. No consolidative airspace disease. No pleural effusions. No pneumothorax. No evidence of pulmonary edema. Heart size is upper limits of normal. Upper mediastinal contours are within normal limits allowing for patient positioning. Atherosclerotic calcifications are noted in the thoracic aorta. IMPRESSION: 1. Low lung volumes with an appearance suggestive of a background of interstitial lung disease. This could be further evaluated with follow-up nonemergent high-resolution chest CT if clinically appropriate. No definite acute findings are noted on today's examination. 2. Aortic atherosclerosis. Electronically Signed   By: Vinnie Langton M.D.   On: 01/28/2022 08:46    Procedures .Critical Care  Performed by: Anselmo Pickler, PA-C Authorized by: Anselmo Pickler, PA-C   Critical care provider statement:    Critical care time (minutes):  32   Critical care was necessary to treat or prevent imminent or life-threatening deterioration of the following conditions:  Respiratory failure (Acute hypoxic respiratory failure requiring oxygen)   Critical care was time spent personally by me on the following activities:  Development of treatment plan with patient or surrogate, discussions with consultants, evaluation of patient's response to treatment,  examination of patient, ordering and review of laboratory studies, ordering and review of radiographic studies, ordering and performing treatments and interventions, pulse oximetry, re-evaluation of patient's condition and review of old charts   Care discussed with: admitting provider       Medications Ordered in ED Medications  molnupiravir EUA (LAGEVRIO) capsule 800 mg (has no administration in time range)  sodium chloride flush (NS) 0.9 % injection 3 mL (3 mLs Intravenous Not Given 01/28/22 1333)  sodium chloride flush (NS) 0.9 % injection 3 mL (3 mLs Intravenous Not Given 01/28/22 1333)  sodium chloride flush (NS) 0.9 % injection 3 mL (has no administration in time range)  0.9 %  sodium chloride infusion (has no administration in time range)  albuterol (VENTOLIN HFA) 108 (90 Base) MCG/ACT inhaler 2 puff (has no administration in time range)  methylPREDNISolone sodium succinate (SOLU-MEDROL) 40 mg/mL injection 35 mg (has no administration in time range)    Followed by  predniSONE (DELTASONE) tablet 50 mg (has no administration in time range)  guaiFENesin-dextromethorphan (ROBITUSSIN DM) 100-10 MG/5ML syrup 10 mL (has no administration in time range)  chlorpheniramine-HYDROcodone (TUSSIONEX) 10-8 MG/5ML suspension 5 mL (has no administration in time range)  ascorbic acid (VITAMIN C) tablet 500 mg (has no administration in time range)  zinc sulfate capsule 220 mg (has no administration in time range)  acetaminophen (TYLENOL) tablet 650 mg (has no administration in time range)  oxyCODONE (Oxy IR/ROXICODONE) immediate release tablet 5 mg (has no administration in time range)  docusate sodium (COLACE) capsule 100 mg (has no administration in time range)  polyethylene glycol (MIRALAX / GLYCOLAX) packet 17 g (has no administration in time range)  bisacodyl (DULCOLAX) EC tablet 5 mg (has no administration in time range)  sodium phosphate (FLEET) 7-19 GM/118ML enema 1 enema (has no administration  in time range)  ondansetron (ZOFRAN) tablet 4 mg (has no administration in time range)    Or  ondansetron (ZOFRAN) injection 4 mg (has no administration in time range)  enoxaparin (LOVENOX) injection 30 mg (has no administration in time range)  lactated ringers infusion (has no administration in time range)  fluconazole (DIFLUCAN) IVPB 100 mg (has no administration in time range)  fluconazole (DIFLUCAN) tablet 100 mg (has no administration in time range)  nystatin (MYCOSTATIN/NYSTOP) topical powder (has no administration  in time range)  lactated ringers bolus 1,000 mL (0 mLs Intravenous Stopped 01/28/22 1233)  ipratropium-albuterol (DUONEB) 0.5-2.5 (3) MG/3ML nebulizer solution 3 mL (3 mLs Nebulization Given 01/28/22 0843)  potassium chloride SA (KLOR-CON M) CR tablet 40 mEq (40 mEq Oral Given 01/28/22 1141)  potassium chloride 10 mEq in 100 mL IVPB (0 mEq Intravenous Stopped 01/28/22 1306)    ED Course/ Medical Decision Making/ A&P Clinical Course as of 01/28/22 1519  Sun Jan 28, 2022  0904 Per son -- 101 temp around Tuesday/Wednesday. Today with worse cough, weakness, questionable delirium, wanted to get him checked out [CP]    Clinical Course User Index [CP] Anselmo Pickler, PA-C                           Medical Decision Making Amount and/or Complexity of Data Reviewed Labs: ordered. Radiology: ordered.  Risk Prescription drug management. Decision regarding hospitalization.  This patient is a 88 y.o. male who presents to the ED for concern of hypoxia, cough, decreased energy, he has a recent known covid diagnosis this involves an extensive number of treatment options, and is a complaint that carries with it a high risk of complications and morbidity. The emergent differential diagnosis prior to evaluation includes, but is not limited to, secondary to COVID-pneumonia, COVID complications, other hypoxia ACS, heart failure exacerbation, versus other.   This is not an  exhaustive differential.   Past Medical History / Co-morbidities / Social History: anxiety, depression, acid reflux, arthritis, advanced age, hearing loss,  Additional history: Chart reviewed. Pertinent results include: Reviewed recent video visit with his PCP for COVID, he is currently taking molnupiravir  Physical Exam: Physical exam performed. The pertinent findings include: Patient arrives on 4 L nasal cannula with normal oxygen saturation on nasal cannula.  His blood pressures are for the most part normotensive although he does have mild diastolic hypotension diastolic of 54 on arrival, he is afebrile at time of arrival, with normal respiratory rate.  He does have some significant cardiac dysfunction with no left bundle branch but significantly altering rate with low rate around 30 to 40s, heart rate around 70s.  He is requiring 4 L nasal cannula on arrival but is able to titrate down to 2L without desaturation.  Lab Tests: I ordered, and personally interpreted labs.  The pertinent results include:     Imaging Studies: I ordered imaging studies including plain film chest x ray. I independently visualized and interpreted imaging which showed some questionable developing interstitial lung disease, may need ct in future to further evaluate. No evidence of acute focal consolidation / pneumonia. I agree with the radiologist interpretation.   Cardiac Monitoring:  The patient was maintained on a cardiac monitor.  My attending physician Dr. Johnney Killian viewed and interpreted the cardiac monitored which showed an underlying rhythm of: LBBB, supraventricular bigeminy, slow rate. I agree with this interpretation.   Medications: I ordered medication including potassium chloride, fluid bolus, DuoNeb for wheezing/rhonchi, AKI, low normal potassium with goal for high normal for cardiac dysrhythmia,. Reevaluation of the patient after these medicines showed that the patient improved. I have reviewed the  patients home medicines and have made adjustments as needed.  Consultations Obtained: I requested consultation with the hospitalist, spoke with Dr. Lorin Mercy,  and discussed lab and imaging findings as well as pertinent plan - they recommend: Admission at this time for acute hypoxic respiratory failure in setting of COVID-19, with mild AKI  Disposition: After consideration of the diagnostic results and the patients response to treatment, I feel that patient would benefit from admission as discussed above.   I discussed this case with my attending physician Dr. Johnney Killian who cosigned this note including patient's presenting symptoms, physical exam, and planned diagnostics and interventions. Attending physician stated agreement with plan or made changes to plan which were implemented.    Final Clinical Impression(s) / ED Diagnoses Final diagnoses:  None    Rx / DC Orders ED Discharge Orders     None         Dorien Chihuahua 01/28/22 1519    Charlesetta Shanks, MD 01/30/22 1536

## 2022-01-29 DIAGNOSIS — U071 COVID-19: Secondary | ICD-10-CM | POA: Diagnosis not present

## 2022-01-29 LAB — CBC WITH DIFFERENTIAL/PLATELET
Abs Immature Granulocytes: 0.03 10*3/uL (ref 0.00–0.07)
Basophils Absolute: 0 10*3/uL (ref 0.0–0.1)
Basophils Relative: 0 %
Eosinophils Absolute: 0 10*3/uL (ref 0.0–0.5)
Eosinophils Relative: 0 %
HCT: 34.2 % — ABNORMAL LOW (ref 39.0–52.0)
Hemoglobin: 11.4 g/dL — ABNORMAL LOW (ref 13.0–17.0)
Immature Granulocytes: 1 %
Lymphocytes Relative: 11 %
Lymphs Abs: 0.6 10*3/uL — ABNORMAL LOW (ref 0.7–4.0)
MCH: 30.5 pg (ref 26.0–34.0)
MCHC: 33.3 g/dL (ref 30.0–36.0)
MCV: 91.4 fL (ref 80.0–100.0)
Monocytes Absolute: 0.2 10*3/uL (ref 0.1–1.0)
Monocytes Relative: 3 %
Neutro Abs: 4.3 10*3/uL (ref 1.7–7.7)
Neutrophils Relative %: 85 %
Platelets: 199 10*3/uL (ref 150–400)
RBC: 3.74 MIL/uL — ABNORMAL LOW (ref 4.22–5.81)
RDW: 14.1 % (ref 11.5–15.5)
WBC: 5.1 10*3/uL (ref 4.0–10.5)
nRBC: 0 % (ref 0.0–0.2)

## 2022-01-29 LAB — COMPREHENSIVE METABOLIC PANEL
ALT: 18 U/L (ref 0–44)
AST: 22 U/L (ref 15–41)
Albumin: 2.5 g/dL — ABNORMAL LOW (ref 3.5–5.0)
Alkaline Phosphatase: 54 U/L (ref 38–126)
Anion gap: 8 (ref 5–15)
BUN: 27 mg/dL — ABNORMAL HIGH (ref 8–23)
CO2: 19 mmol/L — ABNORMAL LOW (ref 22–32)
Calcium: 8.3 mg/dL — ABNORMAL LOW (ref 8.9–10.3)
Chloride: 108 mmol/L (ref 98–111)
Creatinine, Ser: 1.31 mg/dL — ABNORMAL HIGH (ref 0.61–1.24)
GFR, Estimated: 49 mL/min — ABNORMAL LOW (ref >60)
Glucose, Bld: 141 mg/dL — ABNORMAL HIGH (ref 70–99)
Potassium: 4.9 mmol/L (ref 3.5–5.1)
Sodium: 135 mmol/L (ref 135–145)
Total Bilirubin: 1.1 mg/dL (ref 0.3–1.2)
Total Protein: 6.5 g/dL (ref 6.5–8.1)

## 2022-01-29 LAB — PROCALCITONIN: Procalcitonin: 0.1 ng/mL

## 2022-01-29 NOTE — Evaluation (Signed)
Physical Therapy Evaluation Patient Details Name: Allen Wheeler MRN: [id number] DOB: 06-08-1919 Today's Date: 01/29/2022  History of Present Illness  88 y.o. male admitted10/22 with Acute hypoxic respiratory failure:  COVID 19 Infection: heart block, acute kidney injury.  Clinical Impression  Pt admitted with above diagnosis. Eager to work with therapy, very pleasant, family in room and very supportive. Patient able to ambulate multiple laps in room on room air, maintaining 94% SpO2 with mild DOE. One episode of LOB while turning required up to min assist to correct balance. Agreeable to HHPT. Mobility team consulted for frequent OOB mobility during admission. Family brought hearing-aids and pt able to hear much better when in place. Pt currently with functional limitations due to the deficits listed below (see PT Problem List). Pt will benefit from skilled PT to increase their independence and safety with mobility to allow discharge to the venue listed below.          Recommendations for follow up therapy are one component of a multi-disciplinary discharge planning process, led by the attending physician.  Recommendations may be updated based on patient status, additional functional criteria and insurance authorization.  Follow Up Recommendations Home health PT      Assistance Recommended at Discharge Intermittent Supervision/Assistance  Patient can return home with the following  A little help with walking and/or transfers;A little help with bathing/dressing/bathroom;Assistance with cooking/housework;Help with stairs or ramp for entrance;Assist for transportation    Equipment Recommendations None recommended by PT  Recommendations for Other Services       Functional Status Assessment Patient has had a recent decline in their functional status and demonstrates the ability to make significant improvements in function in a reasonable and predictable amount of time.     Precautions /  Restrictions Precautions Precautions: Fall Precaution Comments: Monitor O2 Restrictions Weight Bearing Restrictions: No      Mobility  Bed Mobility Overal bed mobility: Needs Assistance Bed Mobility: Supine to Sit, Sit to Supine     Supine to sit: Supervision Sit to supine: Supervision   General bed mobility comments: Supervision for safety only, somewhat effortful but mostly just uncomfortable for pt due to scrotal candidiasis.    Transfers Overall transfer level: Needs assistance Equipment used: Rolling walker (2 wheels), None Transfers: Sit to/from Stand Sit to Stand: Min guard           General transfer comment: Min guard for safety, progressed to supervision with several trials during session today. Practiced with and without AD. Pt slow to rise but stable upon standing.    Ambulation/Gait Ambulation/Gait assistance: Min assist Gait Distance (Feet): 50 Feet (x2) Assistive device: Rolling walker (2 wheels), None Gait Pattern/deviations: Step-through pattern, Decreased stride length, Scissoring, Staggering right Gait velocity: decreased Gait velocity interpretation: <1.8 ft/sec, indicate of risk for recurrent falls   General Gait Details: Majority of gait training performed without AD, demonstrated one episode of LOB while turning towards Rt hand side requiring min assist to correct LOB. Educated on safe AD use with RW, cues for sequencing and to keep RW on floor while turning. SpO2 maintained 94% on room air, with mild DOE. HR fluctuated mid 30s to low 60s during session.  Stairs            Wheelchair Mobility    Modified Rankin (Stroke Patients Only)       Balance Overall balance assessment: Needs assistance Sitting-balance support: Feet supported, No upper extremity supported Sitting balance-Leahy Scale: Good     Standing balance  support: No upper extremity supported, During functional activity Standing balance-Leahy Scale: Fair                                Pertinent Vitals/Pain Pain Assessment Pain Assessment: 0-10 Pain Score: 4  Pain Location: scrotum Pain Descriptors / Indicators: Burning Pain Intervention(s): Monitored during session, Repositioned    Home Living Family/patient expects to be discharged to:: Private residence Living Arrangements: Alone Available Help at Discharge: Family;Available PRN/intermittently (24hr supervision available initially) Type of Home: House Home Access: Stairs to enter Entrance Stairs-Rails: None Entrance Stairs-Number of Steps: 1   Home Layout: One level Home Equipment: Conservation officer, nature (2 wheels) (Family states RW available)      Prior Function Prior Level of Function : Independent/Modified Independent             Mobility Comments: Denies falls, but states he has been off balance       Hand Dominance        Extremity/Trunk Assessment   Upper Extremity Assessment Upper Extremity Assessment: Defer to OT evaluation    Lower Extremity Assessment Lower Extremity Assessment: Generalized weakness       Communication   Communication: HOH (Needs his hearing aids in.)  Cognition Arousal/Alertness: Awake/alert Behavior During Therapy: WFL for tasks assessed/performed Overall Cognitive Status: Within Functional Limits for tasks assessed                                          General Comments General comments (skin integrity, edema, etc.): Family present, supportive during session.    Exercises     Assessment/Plan    PT Assessment Patient needs continued PT services  PT Problem List Decreased strength;Decreased activity tolerance;Decreased balance;Decreased mobility;Decreased coordination;Decreased knowledge of use of DME;Cardiopulmonary status limiting activity       PT Treatment Interventions DME instruction;Gait training;Stair training;Functional mobility training;Therapeutic activities;Therapeutic exercise;Balance  training;Neuromuscular re-education;Patient/family education    PT Goals (Current goals can be found in the Care Plan section)  Acute Rehab PT Goals Patient Stated Goal: Get well, go home, have more PT PT Goal Formulation: With patient/family Time For Goal Achievement: 02/13/22 Potential to Achieve Goals: Good    Frequency Min 3X/week     Co-evaluation               AM-PAC PT "6 Clicks" Mobility  Outcome Measure Help needed turning from your back to your side while in a flat bed without using bedrails?: None Help needed moving from lying on your back to sitting on the side of a flat bed without using bedrails?: None Help needed moving to and from a bed to a chair (including a wheelchair)?: A Little Help needed standing up from a chair using your arms (e.g., wheelchair or bedside chair)?: A Little Help needed to walk in hospital room?: A Little Help needed climbing 3-5 steps with a railing? : A Lot 6 Click Score: 19    End of Session Equipment Utilized During Treatment: Gait belt;Oxygen (intermittent O2 beginning/end of session) Activity Tolerance: Patient tolerated treatment well Patient left: in bed;with call bell/phone within reach;with bed alarm set;with family/visitor present Nurse Communication: Mobility status PT Visit Diagnosis: Unsteadiness on feet (R26.81);Other abnormalities of gait and mobility (R26.89);Muscle weakness (generalized) (M62.81);Difficulty in walking, not elsewhere classified (R26.2)    Time: 6389-3734 PT Time Calculation (min) (ACUTE ONLY):  42 min   Charges:   PT Evaluation $PT Eval Low Complexity: 1 Low PT Treatments $Gait Training: 8-22 mins $Therapeutic Activity: 8-22 mins        Candie Mile, PT, DPT Physical Therapist Acute Rehabilitation Services [place] 01/29/2022, 12:34 PM

## 2022-01-29 NOTE — Progress Notes (Signed)
PROGRESS NOTE    Allen Wheeler  ZOX:[id number] DOB: Nov 22, 1919 DOA: 01/28/2022 PCP: [name], MD   Brief Narrative:  This 88 years old male without significant medical history presented in the ED with COVID infection.  Patient is very hard of hearing despite hearing aids.  Family reports that they went to [facility] last week to celebrate his 89th birthday.  After he returned he has developed cough, congestion and fatigue started 10/18.  He has COVID test that was positive.  Patient reports weakness,  fatigue and has decreased p.o. intake.  Patient has been taking molnupiravir, He is also found to have fluctuating heart rate, AKI, requiring 2 L of supplemental oxygen to maintain saturation above 94%.   Assessment & Plan:   Principal Problem:   COVID-19 virus infection Active Problems:   BPH with urinary obstruction   Hypothyroidism   Renal impairment   Genitourinary infection, candidal  Acute hypoxic respiratory failure: COVID 19 Infection: Patient presented with shortness of breath, hypoxia SPO2 90% on room air,  generalized weakness,  fatigue and cough.  COVID test positive. Chest x-ray shows multifocal opacities which may be consistent with COVID Continue airborne isolation.  Hold on antibiotics if procalcitonin <0.5 Continue IV Solu-Medrol, albuterol inhaler, molnupiravir Daily weight, intake output charting. PT and OT consult.  Encourage mobilization/ambulation as much as possible Continue supplemental oxygen wean as tolerated  Acute kidney injury on CKD stage IIIb: Baseline serum creatinine around 1.2-1.3, creatinine on arrival 1.8 Likely prerenal due to dehydration.  Continue IV hydration. Avoid nephrotoxic medications, serum creatinine improving 1.3  Bradycardia: Patient heart rate drops as low as 30 nonsustained. Patient remains asymptomatic. Patient is not on any AV nodal blocking agents. EKG shows left bundle branch block which is not new and seen on prior  EKGs. Case was discussed with Dr. Kalman Shan, feels patient has second-degree AV block Mobitz type I No intervention recommended.  Candidal infection: Severe groin candidiasis Patient was given IV fluconazole x 1.   Continue fluconazole  BPH: Continue Flomax 0.4 mg daily.  Hypothyroidism: Continue levothyroxine  Mood disorder: Continue citalopram,Ativan prn  Goals of care discussion: Patient is partial code.  He does not want intubation but would be okay with CPR. Needs more clear discussion.  DVT prophylaxis: Lovenox Code Status: Partial code Family Communication: No family at bedside. Disposition Plan:   Status is: Inpatient Remains inpatient appropriate because:   Admitted for COVID infection, acute hypoxic respiratory failure AKI.  Patient is not medically clear,     Consultants:  None  Procedures: None  Antimicrobials: None  Subjective: Patient was seen and examined at bedside.  Overnight events noted.   Patient reports feeling much improved,  he remains on 2 L/min. Patient was having breakfast,  patient is very hard of hearing,  communicates by writing on the board.  Objective: Vitals:   01/28/22 1645 01/28/22 2001 01/29/22 0600 01/29/22 1030  BP: (!) 193/93 135/84 (!) 159/79 (!) 148/63  Pulse: 66 66 65 (!) 40  Resp: (!) '21 19 19 19  '$ Temp:  (!) 97.5 F (36.4 C) 98.5 F (36.9 C) 98.3 F (36.8 C)  TempSrc:  Oral    SpO2: 100% 98% 95% 100%    Intake/Output Summary (Last 24 hours) at 01/29/2022 1148 Last data filed at 01/29/2022 0800 Gross per 24 hour  Intake 240 ml  Output 1350 ml  Net -1110 ml   There were no vitals filed for this visit.  Examination:  General exam: Appears comfortable,  not in any acute distress, very deconditioned. Respiratory system: Decreased breath sounds bilaterally, respiratory effort normal, no accessory muscle use Cardiovascular system: S1 & S2 heard, regular rate and rhythm, no murmur. Gastrointestinal system: Abdomen  is soft, distended, non tender, BS+ Central nervous system: Alert and oriented X 3. No focal neurological deficits. Extremities: No edema, no cyanosis, no clubbing. Skin: No rashes, lesions or ulcers Psychiatry: Judgement and insight appear normal. Mood & affect appropriate.   Data Reviewed: I have personally reviewed following labs and imaging studies  CBC: Recent Labs  Lab 01/28/22 0804 01/28/22 0848 01/28/22 0908 01/29/22 0322  WBC 6.1  --   --  5.1  NEUTROABS  --   --   --  4.3  HGB 10.8* 10.5* 13.6 11.4*  HCT 31.2* 31.0* 40.0 34.2*  MCV 92.0  --   --  91.4  PLT 158  --   --  546   Basic Metabolic Panel: Recent Labs  Lab 01/28/22 0848 01/28/22 0908 01/29/22 0322  NA 135 135 135  K 3.8 3.5 4.9  CL 106  --  108  CO2  --   --  19*  GLUCOSE 99  --  141*  BUN 41*  --  27*  CREATININE 1.80*  --  1.31*  CALCIUM  --   --  8.3*   GFR: CrCl cannot be calculated (Unknown ideal weight.). Liver Function Tests: Recent Labs  Lab 01/29/22 0322  AST 22  ALT 18  ALKPHOS 54  BILITOT 1.1  PROT 6.5  ALBUMIN 2.5*   No results for input(s): "LIPASE", "AMYLASE" in the last 168 hours. No results for input(s): "AMMONIA" in the last 168 hours. Coagulation Profile: No results for input(s): "INR", "PROTIME" in the last 168 hours. Cardiac Enzymes: No results for input(s): "CKTOTAL", "CKMB", "CKMBINDEX", "TROPONINI" in the last 168 hours. BNP (last 3 results) No results for input(s): "PROBNP" in the last 8760 hours. HbA1C: No results for input(s): "HGBA1C" in the last 72 hours. CBG: No results for input(s): "GLUCAP" in the last 168 hours. Lipid Profile: No results for input(s): "CHOL", "HDL", "LDLCALC", "TRIG", "CHOLHDL", "LDLDIRECT" in the last 72 hours. Thyroid Function Tests: Recent Labs    01/28/22 2235  TSH 1.265   Anemia Panel: No results for input(s): "VITAMINB12", "FOLATE", "FERRITIN", "TIBC", "IRON", "RETICCTPCT" in the last 72 hours. Sepsis Labs: Recent Labs   Lab 01/28/22 0804  LATICACIDVEN 1.2    Recent Results (from the past 240 hour(s))  SARS Coronavirus 2 by RT PCR (hospital order, performed in [facility] hospital lab) *cepheid single result test*     Status: Abnormal   Collection Time: 01/28/22  8:03 AM   Specimen: Nasal Swab  Result Value Ref Range Status   SARS Coronavirus 2 by RT PCR POSITIVE (A) NEGATIVE Final    Comment: (NOTE) SARS-CoV-2 target nucleic acids are DETECTED  SARS-CoV-2 RNA is generally detectable in upper respiratory specimens  during the acute phase of infection.  Positive results are indicative  of the presence of the identified virus, but do not rule out bacterial infection or co-infection with other pathogens not detected by the test.  Clinical correlation with patient history and  other diagnostic information is necessary to determine patient infection status.  The expected result is negative.  Fact Sheet for Patients:   https://www.patel.info/   Fact Sheet for Healthcare Providers:   https://hall.com/    This test is not yet approved or cleared by the [place] FDA and  has been  authorized for detection and/or diagnosis of SARS-CoV-2 by FDA under an Emergency Use Authorization (EUA).  This EUA will remain in effect (meaning this test can be used) for the duration of  the COVID-19 declaration under Section 564(b)(1)  of the Act, 21 U.S.C. section 360-bbb-3(b)(1), unless the authorization is terminated or revoked sooner.   Performed at [facility], [place] [address], [place], [place] [address]   Blood culture (routine x 2)     Status: None (Preliminary result)   Collection Time: 01/28/22  8:04 AM   Specimen: BLOOD  Result Value Ref Range Status   Specimen Description BLOOD LEFT ANTECUBITAL  Final   Special Requests   Final    BOTTLES DRAWN AEROBIC AND ANAEROBIC Blood Culture adequate volume   Culture   Final    NO GROWTH < 24 HOURS Performed  at [facility], [place] [address] [place], [place] [address]    Report Status PENDING  Incomplete  Blood culture (routine x 2)     Status: None (Preliminary result)   Collection Time: 01/28/22  9:06 AM   Specimen: BLOOD RIGHT HAND  Result Value Ref Range Status   Specimen Description BLOOD RIGHT HAND  Final   Special Requests   Final    BOTTLES DRAWN AEROBIC AND ANAEROBIC Blood Culture results may not be optimal due to an inadequate volume of blood received in culture bottles   Culture   Final    NO GROWTH < 24 HOURS Performed at [facility], [place] [address] [place], [place] [address]    Report Status PENDING  Incomplete         Radiology Studies: DG Chest Portable 1 View  Result Date: 01/28/2022 CLINICAL DATA:  One 88 year old male with history of shortness of breath. EXAM: PORTABLE CHEST 1 VIEW COMPARISON:  Chest x-ray 04/29/2018. FINDINGS: Lung volumes are low. Coarse interstitial markings are again noted throughout the mid to lower lungs bilaterally, suggesting a background of interstitial lung disease. No consolidative airspace disease. No pleural effusions. No pneumothorax. No evidence of pulmonary edema. Heart size is upper limits of normal. Upper mediastinal contours are within normal limits allowing for patient positioning. Atherosclerotic calcifications are noted in the thoracic aorta. IMPRESSION: 1. Low lung volumes with an appearance suggestive of a background of interstitial lung disease. This could be further evaluated with follow-up nonemergent high-resolution chest CT if clinically appropriate. No definite acute findings are noted on today's examination. 2. Aortic atherosclerosis. Electronically Signed   By: Vinnie Langton M.D.   On: 01/28/2022 08:46    Scheduled Meds:  vitamin C  500 mg Oral Daily   citalopram  10 mg Oral q AM   docusate sodium  100 mg Oral BID   enoxaparin (LOVENOX) injection  30 mg Subcutaneous Q24H   fluconazole  100 mg Oral  Daily   gabapentin  200 mg Oral QHS   levothyroxine  50 mcg Oral Q0600   methylPREDNISolone (SOLU-MEDROL) injection  35 mg Intravenous Q12H   Followed by   Derrill Memo ON 01/31/2022] predniSONE  50 mg Oral Daily   molnupiravir EUA  4 capsule Oral BID   nystatin   Topical TID   pantoprazole  40 mg Oral Daily   sodium chloride flush  3 mL Intravenous Q12H   sodium chloride flush  3 mL Intravenous Q12H   tamsulosin  0.8 mg Oral QHS   zinc sulfate  220 mg Oral Daily   Continuous Infusions:  sodium  chloride       LOS: 1 day    Time spent: 50 mins  Shawna Clamp, MD [facility]   If 7PM-7AM, please contact night-coverage

## 2022-01-29 NOTE — TOC Initial Note (Signed)
Transition of Care [facility]) - Initial/Assessment Note    Patient Details  Name: Allen Wheeler MRN: [id number] Date of Birth: 1919/06/06  [facility] CM/SW Contact:    Tom-Johnson, Renea Ee, RN Phone Number: 01/29/2022, 1:13 PM  Clinical Narrative:                  CM spoke with patient's son, Allen Wheeler in visitor's room as patient is on Covid precautions. Admitted for Acute Respiratory with Hypoxia and found to have Covid infection and AKI. Went into Bradycardia last night with HR in the 30's, EKG showed AV Block. Currently on 2L O2 acute, does not se home O2.  From home alone, recently celebrated his 89th birthday. Allen Wheeler states he lives in [place] but visits patient frequently. Patient was independent with care and drove self prior to admission. Has a cane, walker, shower seat at home. Has a Chartered certified accountant that helps with house cleaning. Patient does his own meal prep prior to admission.  PCP is [name], MD and uses [facility].  Home health PT recommended. Allen Wheeler states they have no preference. CM called in referral to [place] with acceptance voiced. Info on AVS.  Resources for Meals on Wheels and [employer] given to [place]. CM will continue to follow as patient progresses towards discharge.   Expected Discharge Plan: [place] Barriers to Discharge: Continued Medical Work up   Patient Goals and CMS Choice Patient states their goals for this hospitalization and ongoing recovery are:: To return home CMS Medicare.gov Compare Post Acute Care list provided to:: Patient Choice offered to / list presented to : Patient, Adult Children  Expected Discharge Plan and Services Expected Discharge Plan: [place]   Discharge Planning Services: CM Consult Post Acute Care Choice: [place] arrangements for the past 2 months: Single Family Home                 DME Arranged: N/A DME Agency: NA       HH Arranged:  PT, OT, RN, Disease Management [place] Agency: [place] Date [place]: 01/29/22 Time HH Agency Contacted: 43 Representative spoke with at [place]: Tommi Rumps  Prior Living Arrangements/Services Living arrangements for the past 2 months: [place] Lives with:: Self Patient language and need for interpreter reviewed:: Yes Do you feel safe going back to the place where you live?: Yes      Need for Family Participation in Patient Care: Yes (Comment) Care giver support system in place?: Yes (comment) Current home services: Home PT (Cane, walker, shower seat) Criminal Activity/Legal Involvement Pertinent to Current Situation/Hospitalization: No - Comment as needed  Activities of Daily Living Home Assistive Devices/Equipment: Eyeglasses, Hearing aid ADL Screening (condition at time of admission) Patient's cognitive ability adequate to safely complete daily activities?: Yes Is the patient deaf or have difficulty hearing?: Yes Does the patient have difficulty seeing, even when wearing glasses/contacts?: No Does the patient have difficulty concentrating, remembering, or making decisions?: No Patient able to express need for assistance with ADLs?: No Does the patient have difficulty dressing or bathing?: No Independently performs ADLs?: Yes (appropriate for developmental age) Does the patient have difficulty walking or climbing stairs?: No Weakness of Legs: Both Weakness of Arms/Hands: None  Permission Sought/Granted Permission sought to share information with : Case Freight forwarder, International aid/development worker  Assessment Appearance:: Appears stated age Attitude/Demeanor/Rapport: Engaged, Gracious Affect (typically observed): Accepting, Appropriate, Calm, Hopeful, Pleasant Orientation: : Oriented to Self, Oriented to Place, Oriented to  Time, Oriented to Situation Alcohol / Substance Use: Not Applicable Psych Involvement: No  (comment)  Admission diagnosis:  COVID-19 virus infection [U07.1] Patient Active Problem List   Diagnosis Date Noted   COVID-19 virus infection 01/28/2022   Renal impairment 01/28/2022   Genitourinary infection, candidal 01/28/2022   Neuropathy 04/29/2020   Nocturnal leg cramps 04/29/2020   IBS (irritable bowel syndrome) 01/06/2020   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   Lumbar pain 01/06/2020   Multiple environmental allergies 12/28/2016   Restless legs syndrome 12/28/2016   Inguinal hernia 06/21/2016   Right inguinal hernia 04/19/2016   Left hand weakness    Sepsis ([place]) 02/15/2016   Cholelithiasis with acute cholangitis with biliary obstruction 02/14/2016   Hypothyroidism 12/13/2015   Eczema 09/16/2014   Abnormal ECG 09/03/2012   SOMNOLENCE 10/31/2009   BPH with urinary obstruction 02/16/2009   Pruritic disorder 02/16/2009   WEAKNESS 02/16/2009   ELEVATED BLOOD PRESSURE 02/16/2009   Anxiety 05/27/2008   Depression 05/27/2008   GERD 05/27/2008   Osteoarthritis 05/27/2008   NEPHROLITHIASIS, HX OF 05/27/2008   PCP:  [name], MD Pharmacy:   [facility] [id number] - [address] [place] [address] Phone: [phone] Fax: [phone] PHARMACY [id number] - [address], [place] - [place] [address] Phone: [phone] Fax: [phone]  [place], [place] [address] Phone: [phone] Fax: [phone]     Social Determinants of Health (SDOH) Interventions    Readmission Risk Interventions     No data to display

## 2022-01-30 DIAGNOSIS — U071 COVID-19: Secondary | ICD-10-CM | POA: Diagnosis not present

## 2022-01-30 LAB — CBC WITH DIFFERENTIAL/PLATELET
Abs Immature Granulocytes: 0.04 10*3/uL (ref 0.00–0.07)
Basophils Absolute: 0 10*3/uL (ref 0.0–0.1)
Basophils Relative: 0 %
Eosinophils Absolute: 0 10*3/uL (ref 0.0–0.5)
Eosinophils Relative: 0 %
HCT: 33.4 % — ABNORMAL LOW (ref 39.0–52.0)
Hemoglobin: 11.5 g/dL — ABNORMAL LOW (ref 13.0–17.0)
Immature Granulocytes: 1 %
Lymphocytes Relative: 12 %
Lymphs Abs: 0.8 10*3/uL (ref 0.7–4.0)
MCH: 31 pg (ref 26.0–34.0)
MCHC: 34.4 g/dL (ref 30.0–36.0)
MCV: 90 fL (ref 80.0–100.0)
Monocytes Absolute: 0.6 10*3/uL (ref 0.1–1.0)
Monocytes Relative: 9 %
Neutro Abs: 5.2 10*3/uL (ref 1.7–7.7)
Neutrophils Relative %: 78 %
Platelets: 231 10*3/uL (ref 150–400)
RBC: 3.71 MIL/uL — ABNORMAL LOW (ref 4.22–5.81)
RDW: 14.1 % (ref 11.5–15.5)
WBC: 6.6 10*3/uL (ref 4.0–10.5)
nRBC: 0 % (ref 0.0–0.2)

## 2022-01-30 LAB — COMPREHENSIVE METABOLIC PANEL
ALT: 18 U/L (ref 0–44)
AST: 24 U/L (ref 15–41)
Albumin: 2.6 g/dL — ABNORMAL LOW (ref 3.5–5.0)
Alkaline Phosphatase: 51 U/L (ref 38–126)
Anion gap: 9 (ref 5–15)
BUN: 22 mg/dL (ref 8–23)
CO2: 20 mmol/L — ABNORMAL LOW (ref 22–32)
Calcium: 8.7 mg/dL — ABNORMAL LOW (ref 8.9–10.3)
Chloride: 108 mmol/L (ref 98–111)
Creatinine, Ser: 1.24 mg/dL (ref 0.61–1.24)
GFR, Estimated: 52 mL/min — ABNORMAL LOW (ref >60)
Glucose, Bld: 136 mg/dL — ABNORMAL HIGH (ref 70–99)
Potassium: 4.3 mmol/L (ref 3.5–5.1)
Sodium: 137 mmol/L (ref 135–145)
Total Bilirubin: 0.9 mg/dL (ref 0.3–1.2)
Total Protein: 6.4 g/dL — ABNORMAL LOW (ref 6.5–8.1)

## 2022-01-30 MED ORDER — NYSTATIN 100000 UNIT/GM EX POWD: Freq: Three times a day (TID) | CUTANEOUS | 0 refills | Status: AC

## 2022-01-30 MED ORDER — MOLNUPIRAVIR EUA 200MG CAPSULE: 4.0000 | ORAL_CAPSULE | Freq: Two times a day (BID) | ORAL | 0 refills | Status: DC

## 2022-01-30 NOTE — Progress Notes (Signed)
Assisted patient to call son from room phone, no answer.

## 2022-01-30 NOTE — Progress Notes (Signed)
PIVs removed. AVS reviewed with patient's son, [name].

## 2022-01-30 NOTE — Discharge Summary (Signed)
Physician Discharge Summary  Allen Wheeler ULA:[id number] DOB: 1920-01-13 DOA: 01/28/2022  PCP: [name], MD  Admit date: 01/28/2022  Discharge date: 01/30/2022  Admitted From: Home. Disposition: Home Health Services.  Recommendations for Outpatient Follow-up:  Follow up with PCP in 1-2 weeks. Please obtain BMP/CBC in one week, Advised to continue molnupiravir 800 mg twice daily for 3 more days for COVID+. Advised primary care physician to refer patient to pulmonologist. Advised to use nystatin for groin rash.  Home Health: Home PT/ OT Equipment/Devices: None  Discharge Condition: Stable CODE STATUS: Limited code Diet recommendation: Heart Healthy  Brief [facility] Course: This 88 years old male without significant medical history presented in the ED with COVID infection.  Patient is very hard of hearing despite hearing aids.  Family reports that they went to [facility] last week to celebrate his 89th birthday.  After he returned he has developed dry cough, congestion and fatigue that started on 10/18/ 23.  He has COVID test that was positive. Patient reports generalized weakness, fatigue and has decreased p.o. intake.  Patient has been taking molnupiravir, He is also found to have fluctuating heart rate, AKI, requiring 2 L of supplemental oxygen to maintain saturation above 94%.  Patient was continued on supportive care, gentle IV hydration.  Renal functions improved and back to normal.  He is weaned down to room air.  Heart rate dropped to 30, patient remained asymptomatic.  EKG shows second-degree AV block type I, patient not on any AV nodal blocking medications.  PT and OT recommended home health services.  Patient feels better and patient is being discharged home advised to continue molnupiravir 800 mg for 3 more days.  Patient is being discharged home.  Discharge Diagnoses:  Principal Problem:   COVID-19 virus infection Active Problems:   BPH with urinary  obstruction   Hypothyroidism   Renal impairment   Genitourinary infection, candidal   Acute hypoxic respiratory failure: COVID 19 Infection: Patient presented with shortness of breath, hypoxia SPO2 90% on room air,  generalized weakness,  fatigue and cough.  COVID test positive. Chest x-ray shows multifocal opacities which may be consistent with COVID Continue airborne isolation.  Hold on antibiotics if procalcitonin <0.5 Continue IV Solu-Medrol, albuterol inhaler, molnupiravir Daily weight, intake output charting. PT and OT consult.  Encourage mobilization/ambulation as much as possible Continue supplemental oxygen wean as tolerated. He has been down to room air.   Acute kidney injury on CKD stage IIIb: Baseline serum creatinine around 1.2-1.3, creatinine on arrival 1.8 Likely prerenal due to dehydration.  Continue IV hydration. Avoid nephrotoxic medications, serum creatinine improved to 1.3   Bradycardia: Patient heart rate drops as low as 30 nonsustained. Patient remains asymptomatic. Patient is not on any AV nodal blocking agents. EKG shows left bundle branch block which is not new and seen on prior EKGs. Case was discussed with Dr. Kalman Shan, feels patient has second-degree AV block Mobitz type I No intervention recommended.   Candidal infection: Severe groin candidiasis Patient was given IV fluconazole x 1.   Continue nystatin.   BPH: Continue Flomax 0.4 mg daily.   Hypothyroidism: Continue levothyroxine   Mood disorder: Continue citalopram,Ativan prn   Goals of care discussion: Patient is partial code.  He does not want intubation but would be okay with CPR. Needs more clear discussion.    Discharge Instructions  Discharge Instructions     Call MD for:  difficulty breathing, headache or visual disturbances   Complete by: As directed  Call MD for:  persistant dizziness or light-headedness   Complete by: As directed    Call MD for:  persistant nausea and  vomiting   Complete by: As directed    Diet - low sodium heart healthy   Complete by: As directed    Diet Carb Modified   Complete by: As directed    Discharge instructions   Complete by: As directed    Advised to follow-up with primary care physician in 1 week. Advised to continue molnupiravir 800 mg twice daily for 3 more days for COVID+. Advised primary care physician to refer patient to pulmonologist.   Increase activity slowly   Complete by: As directed       Allergies as of 01/30/2022   No Known Allergies      Medication List     TAKE these medications    Centrum Silver Adult 50+ Tabs Take 1 tablet by mouth in the morning.   citalopram 10 MG tablet Commonly known as: CELEXA TAKE ONE TABLET ONCE DAILY What changed: when to take this   diphenoxylate-atropine 2.5-0.025 MG tablet Commonly known as: LOMOTIL TAKE ONE OR TWO TABLETS BY MOUTH EVERY MORNING What changed:  how much to take how to take this when to take this reasons to take this additional instructions   gabapentin 100 MG capsule Commonly known as: NEURONTIN Take 2 capsules (200 mg total) by mouth at bedtime.   hydrOXYzine 25 MG capsule Commonly known as: VISTARIL TAKE ONE CAPSULE BY MOUTH EVERY 6 HOURS AS NEEDED FOR ITCHING What changed: See the new instructions.   levothyroxine 50 MCG tablet Commonly known as: SYNTHROID TAKE 1 TABLET ONCE DAILY. What changed: when to take this   LORazepam 1 MG tablet Commonly known as: ATIVAN Take 1 tablet (1 mg total) by mouth at bedtime as needed. What changed: reasons to take this   molnupiravir EUA 200 mg Caps capsule Commonly known as: LAGEVRIO Take 4 capsules (800 mg total) by mouth 2 (two) times daily for 3 days.   nystatin powder Commonly known as: MYCOSTATIN/NYSTOP Apply topically 3 (three) times daily.   omeprazole 40 MG capsule Commonly known as: PRILOSEC TAKE 1 CAPSULE DAILY. What changed: when to take this   predniSONE 10 MG  tablet Commonly known as: DELTASONE TAKE 1 TABLET ONCE DAILY. What changed: when to take this   tamsulosin 0.4 MG Caps capsule Commonly known as: FLOMAX TAKE TWO CAPSULES BY MOUTH DAILY What changed: when to take this        Follow-up Information     [facility] Follow up.   Specialty: Home Health Services Why: Someone will call you to schedule first home visit. Contact information: [address] [place] [address] [phone]         [name], MD Follow up in 1 week(s).   Specialty: Family Medicine Contact information: [place] [id number] [phone]                No Known Allergies  Consultations: None   Procedures/Studies: DG Chest Portable 1 View  Result Date: 01/28/2022 CLINICAL DATA:  One 88 year old male with history of shortness of breath. EXAM: PORTABLE CHEST 1 VIEW COMPARISON:  Chest x-ray 04/29/2018. FINDINGS: Lung volumes are low. Coarse interstitial markings are again noted throughout the mid to lower lungs bilaterally, suggesting a background of interstitial lung disease. No consolidative airspace disease. No pleural effusions. No pneumothorax. No evidence of pulmonary edema.  Heart size is upper limits of normal. Upper mediastinal contours are within normal limits allowing for patient positioning. Atherosclerotic calcifications are noted in the thoracic aorta. IMPRESSION: 1. Low lung volumes with an appearance suggestive of a background of interstitial lung disease. This could be further evaluated with follow-up nonemergent high-resolution chest CT if clinically appropriate. No definite acute findings are noted on today's examination. 2. Aortic atherosclerosis. Electronically Signed   By: Vinnie Langton M.D.   On: 01/28/2022 08:46      Subjective: Patient was seen and examined at bedside.  Overnight events noted.   Patient reports feeling much improved and wants to be  discharged.   Patient is being discharged home,  he is weaned down to room air.  Discharge Exam: Vitals:   01/30/22 0524 01/30/22 0848  BP: 124/69 126/67  Pulse: (!) 58 (!) 50  Resp: 18 17  Temp: 97.8 F (36.6 C) 97.7 F (36.5 C)  SpO2: 95% 95%   Vitals:   01/29/22 1733 01/29/22 2131 01/30/22 0524 01/30/22 0848  BP: (!) 183/96 (!) 156/96 124/69 126/67  Pulse: 83 65 (!) 58 (!) 50  Resp: '17 18 18 17  '$ Temp: 98.1 F (36.7 C) 98.4 F (36.9 C) 97.8 F (36.6 C) 97.7 F (36.5 C)  TempSrc: Oral  Oral Oral  SpO2: 95% 97% 95% 95%    General: Pt is alert, awake, not in acute distress Cardiovascular: RRR, S1/S2 +, no rubs, no gallops Respiratory: CTA bilaterally, no wheezing, no rhonchi Abdominal: Soft, NT, ND, bowel sounds + Extremities: no edema, no cyanosis    The results of significant diagnostics from this hospitalization (including imaging, microbiology, ancillary and laboratory) are listed below for reference.     Microbiology: Recent Results (from the past 240 hour(s))  SARS Coronavirus 2 by RT PCR (hospital order, performed in [facility] hospital lab) *cepheid single result test*     Status: Abnormal   Collection Time: 01/28/22  8:03 AM   Specimen: Nasal Swab  Result Value Ref Range Status   SARS Coronavirus 2 by RT PCR POSITIVE (A) NEGATIVE Final    Comment: (NOTE) SARS-CoV-2 target nucleic acids are DETECTED  SARS-CoV-2 RNA is generally detectable in upper respiratory specimens  during the acute phase of infection.  Positive results are indicative  of the presence of the identified virus, but do not rule out bacterial infection or co-infection with other pathogens not detected by the test.  Clinical correlation with patient history and  other diagnostic information is necessary to determine patient infection status.  The expected result is negative.  Fact Sheet for Patients:   https://www.patel.info/   Fact Sheet for Healthcare Providers:    https://hall.com/    This test is not yet approved or cleared by the [place] FDA and  has been authorized for detection and/or diagnosis of SARS-CoV-2 by FDA under an Emergency Use Authorization (EUA).  This EUA will remain in effect (meaning this test can be used) for the duration of  the COVID-19 declaration under Section 564(b)(1)  of the Act, 21 U.S.C. section 360-bbb-3(b)(1), unless the authorization is terminated or revoked sooner.   Performed at [facility], [place] [address] [place], [place] [address]   Blood culture (routine x 2)     Status: None (Preliminary result)   Collection Time: 01/28/22  8:04 AM   Specimen: BLOOD  Result Value Ref Range Status   Specimen Description BLOOD LEFT ANTECUBITAL  Final   Special Requests   Final  BOTTLES DRAWN AEROBIC AND ANAEROBIC Blood Culture adequate volume   Culture   Final    NO GROWTH 2 DAYS Performed at [facility], [place] [address] [place], [place] [address]    Report Status PENDING  Incomplete  Blood culture (routine x 2)     Status: None (Preliminary result)   Collection Time: 01/28/22  9:06 AM   Specimen: BLOOD RIGHT HAND  Result Value Ref Range Status   Specimen Description BLOOD RIGHT HAND  Final   Special Requests   Final    BOTTLES DRAWN AEROBIC AND ANAEROBIC Blood Culture results may not be optimal due to an inadequate volume of blood received in culture bottles   Culture   Final    NO GROWTH 2 DAYS Performed at [facility], [place] [address] [place], [place] [address]    Report Status PENDING  Incomplete     Labs: BNP (last 3 results) Recent Labs    01/28/22 0804  BNP 458.0*   Basic Metabolic Panel: Recent Labs  Lab 01/28/22 0848 01/28/22 0908 01/29/22 0322 01/30/22 0444  NA 135 135 135 137  K 3.8 3.5 4.9 4.3  CL 106  --  108 108  CO2  --   --  19* 20*  GLUCOSE 99  --  141* 136*  BUN 41*  --  27* 22  CREATININE 1.80*  --  1.31* 1.24  CALCIUM  --    --  8.3* 8.7*   Liver Function Tests: Recent Labs  Lab 01/29/22 0322 01/30/22 0444  AST 22 24  ALT 18 18  ALKPHOS 54 51  BILITOT 1.1 0.9  PROT 6.5 6.4*  ALBUMIN 2.5* 2.6*   No results for input(s): "LIPASE", "AMYLASE" in the last 168 hours. No results for input(s): "AMMONIA" in the last 168 hours. CBC: Recent Labs  Lab 01/28/22 0804 01/28/22 0848 01/28/22 0908 01/29/22 0322 01/30/22 0444  WBC 6.1  --   --  5.1 6.6  NEUTROABS  --   --   --  4.3 5.2  HGB 10.8* 10.5* 13.6 11.4* 11.5*  HCT 31.2* 31.0* 40.0 34.2* 33.4*  MCV 92.0  --   --  91.4 90.0  PLT 158  --   --  199 231   Cardiac Enzymes: No results for input(s): "CKTOTAL", "CKMB", "CKMBINDEX", "TROPONINI" in the last 168 hours. BNP: Invalid input(s): "POCBNP" CBG: No results for input(s): "GLUCAP" in the last 168 hours. D-Dimer No results for input(s): "DDIMER" in the last 72 hours. Hgb A1c No results for input(s): "HGBA1C" in the last 72 hours. Lipid Profile No results for input(s): "CHOL", "HDL", "LDLCALC", "TRIG", "CHOLHDL", "LDLDIRECT" in the last 72 hours. Thyroid function studies Recent Labs    01/28/22 2235  TSH 1.265   Anemia work up No results for input(s): "VITAMINB12", "FOLATE", "FERRITIN", "TIBC", "IRON", "RETICCTPCT" in the last 72 hours. Urinalysis    Component Value Date/Time   COLORURINE YELLOW 07/16/2016 [place] 07/16/2016 0747   LABSPEC 1.027 07/16/2016 0747   PHURINE 5.0 07/16/2016 0747   GLUCOSEU NEGATIVE 07/16/2016 0747   HGBUR NEGATIVE 07/16/2016 [place] 07/16/2016 0747   BILIRUBINUR n 07/25/2015 1236   KETONESUR NEGATIVE 07/16/2016 0747   PROTEINUR 30 (A) 07/16/2016 0747   UROBILINOGEN 0.2 07/25/2015 1236   UROBILINOGEN 1.0 12/22/2013 0341   NITRITE NEGATIVE 07/16/2016 [place] 07/16/2016 0747   Sepsis Labs Recent Labs  Lab 01/28/22 0804 01/29/22 0322 01/30/22 0444  WBC 6.1 5.1 6.6   Microbiology Recent  Results (from the past 240 hour(s))  SARS Coronavirus 2 by RT PCR (hospital order, performed in [facility] hospital lab) *cepheid single result test*     Status: Abnormal   Collection Time: 01/28/22  8:03 AM   Specimen: Nasal Swab  Result Value Ref Range Status   SARS Coronavirus 2 by RT PCR POSITIVE (A) NEGATIVE Final    Comment: (NOTE) SARS-CoV-2 target nucleic acids are DETECTED  SARS-CoV-2 RNA is generally detectable in upper respiratory specimens  during the acute phase of infection.  Positive results are indicative  of the presence of the identified virus, but do not rule out bacterial infection or co-infection with other pathogens not detected by the test.  Clinical correlation with patient history and  other diagnostic information is necessary to determine patient infection status.  The expected result is negative.  Fact Sheet for Patients:   https://www.patel.info/   Fact Sheet for Healthcare Providers:   https://hall.com/    This test is not yet approved or cleared by the [place] FDA and  has been authorized for detection and/or diagnosis of SARS-CoV-2 by FDA under an Emergency Use Authorization (EUA).  This EUA will remain in effect (meaning this test can be used) for the duration of  the COVID-19 declaration under Section 564(b)(1)  of the Act, 21 U.S.C. section 360-bbb-3(b)(1), unless the authorization is terminated or revoked sooner.   Performed at [facility], [place] [address], [place], [place] [address]   Blood culture (routine x 2)     Status: None (Preliminary result)   Collection Time: 01/28/22  8:04 AM   Specimen: BLOOD  Result Value Ref Range Status   Specimen Description BLOOD LEFT ANTECUBITAL  Final   Special Requests   Final    BOTTLES DRAWN AEROBIC AND ANAEROBIC Blood Culture adequate volume   Culture   Final    NO GROWTH 2 DAYS Performed at [facility], [place] [address] [place],  [place] [address]    Report Status PENDING  Incomplete  Blood culture (routine x 2)     Status: None (Preliminary result)   Collection Time: 01/28/22  9:06 AM   Specimen: BLOOD RIGHT HAND  Result Value Ref Range Status   Specimen Description BLOOD RIGHT HAND  Final   Special Requests   Final    BOTTLES DRAWN AEROBIC AND ANAEROBIC Blood Culture results may not be optimal due to an inadequate volume of blood received in culture bottles   Culture   Final    NO GROWTH 2 DAYS Performed at [facility], [place] [address], [place], [place] [address]    Report Status PENDING  Incomplete     Time coordinating discharge: Over 30 minutes  SIGNED:   Shawna Clamp, MD  [facility] 01/30/2022, 10:54 AM Pager   If 7PM-7AM, please contact night-coverage

## 2022-01-30 NOTE — Progress Notes (Signed)
VM left for son, [name], requesting call back to review discharge and arrange transportation.

## 2022-01-30 NOTE — TOC Transition Note (Addendum)
Transition of Care [facility]) - CM/SW Discharge Note   Patient Details  Name: Allen Wheeler MRN: [id number] Date of Birth: December 27, 1919  [facility] CM/SW Contact:  Tom-Johnson, Renea Ee, RN Phone Number: 01/30/2022, 12:35 PM   Clinical Narrative:     Patient is scheduled for discharge today. Home health info on AVS. Denies any other needs. Family to transport at discharge. No further TOC needs noted.     Final next level of care: [place] Barriers to Discharge: Barriers Resolved   Patient Goals and CMS Choice Patient states their goals for this hospitalization and ongoing recovery are:: To return home CMS Medicare.gov Compare Post Acute Care list provided to:: Patient Choice offered to / list presented to : Patient, Adult Children  Discharge Placement                Patient to be transferred to facility by: Family      Discharge Plan and Services   Discharge Planning Services: CM Consult Post Acute Care Choice: Home Health          DME Arranged: N/A DME Agency: NA       HH Arranged: PT, OT, RN, Disease Management [place] Agency: [place] Date [place]: 01/29/22 Time [place]: 3545 Representative spoke with at [place]: Tommi Rumps  Social Determinants of Health ([place]) Interventions     Readmission Risk Interventions     No data to display

## 2022-01-30 NOTE — Evaluation (Signed)
Occupational Therapy Evaluation Patient Details Name: Allen Wheeler MRN: [id number] DOB: [birth year] Today's Date: 01/30/2022   History of Present Illness 88 y.o. male admitted10/22 with Acute hypoxic respiratory failure:  COVID 19 Infection: heart block, acute kidney injury.   Clinical Impression   Allen Wheeler was evaluated s/p the above admission list, he is indep at baseline and lives at home alone. Upon evaluation pt had functional limitations due to decreased activity tolerance and insight to safety and deficits. Overall he was min G - superivsion for all mobility and ADL tasks. Pt declined use of RW this date, no overt LOB however noted to be unsteady. Recommend RW use in the home environment to reduce fall risk. OT to continue to follow acutely. Recommend d/c to home with [facility].      Recommendations for follow up therapy are one component of a multi-disciplinary discharge planning process, led by the attending physician.  Recommendations may be updated based on patient status, additional functional criteria and insurance authorization.   Follow Up Recommendations  Home health OT    Assistance Recommended at Discharge PRN  Patient can return home with the following A little help with walking and/or transfers;A little help with bathing/dressing/bathroom;Assistance with cooking/housework;Assist for transportation;Help with stairs or ramp for entrance    Functional Status Assessment  Patient has had a recent decline in their functional status and demonstrates the ability to make significant improvements in function in a reasonable and predictable amount of time.  Equipment Recommendations  None recommended by OT    Recommendations for Other Services       Precautions / Restrictions Precautions Precautions: Fall Precaution Comments: Monitor O2, covid+ Restrictions Weight Bearing Restrictions: No      Mobility Bed Mobility Overal bed mobility: Needs Assistance Bed Mobility:  Supine to Sit     Supine to sit: Supervision          Transfers Overall transfer level: Needs assistance Equipment used: None Transfers: Sit to/from Stand Sit to Stand: Min guard           General transfer comment: recommended RW use to increase safety and balance      Balance Overall balance assessment: Needs assistance Sitting-balance support: Feet supported Sitting balance-Leahy Scale: Good     Standing balance support: No upper extremity supported, During functional activity Standing balance-Leahy Scale: Fair                             ADL either performed or assessed with clinical judgement   ADL Overall ADL's : Needs assistance/impaired Eating/Feeding: Independent;Sitting   Grooming: Min guard;Standing   Upper Body Bathing: Set up;Sitting   Lower Body Bathing: Min guard;Sit to/from stand   Upper Body Dressing : Set up;Sitting   Lower Body Dressing: Min guard;Sit to/from stand Lower Body Dressing Details (indicate cue type and reason): able to don socks while sitting EOB Toilet Transfer: Min guard;Ambulation   Toileting- Clothing Manipulation and Hygiene: Supervision/safety;Sitting/lateral lean       Functional mobility during ADLs: Min guard General ADL Comments: Generalized weakness, unsteady but no overt LOB     Vision Baseline Vision/History: 1 Wears glasses Vision Assessment?: Vision impaired- to be further tested in functional context            Pertinent Vitals/Pain Pain Assessment Pain Assessment: Faces Faces Pain Scale: Hurts a little bit Pain Location: scrotum Pain Descriptors / Indicators: Burning Pain Intervention(s): Monitored during session     Hand  Dominance Right   Extremity/Trunk Assessment Upper Extremity Assessment Upper Extremity Assessment: Generalized weakness   Lower Extremity Assessment Lower Extremity Assessment: Defer to PT evaluation   Cervical / Trunk Assessment Cervical / Trunk Assessment:  Kyphotic   Communication Communication Communication: HOH   Cognition Arousal/Alertness: Awake/alert Behavior During Therapy: WFL for tasks assessed/performed Overall Cognitive Status: Within Functional Limits for tasks assessed                 General Comments  VSS on RA. Pt frustrated that he has been discharged but unable to leave            Home Living Family/patient expects to be discharged to:: Private residence Living Arrangements: Alone Available Help at Discharge: Family;Available PRN/intermittently Type of Home: House Home Access: Stairs to enter [employer] of Steps: 1 Entrance Stairs-Rails: None Home Layout: One level     Bathroom Shower/Tub: Occupational psychologist: Standard     Home Equipment: Conservation officer, nature (2 wheels)          Prior Functioning/Environment Prior Level of Function : Independent/Modified Independent             Mobility Comments: Denies falls, but states he has been off balance ADLs Comments: indep        OT Problem List: Decreased range of motion;Decreased activity tolerance;Impaired balance (sitting and/or standing);Cardiopulmonary status limiting activity      OT Treatment/Interventions: Self-care/ADL training;Therapeutic exercise;DME and/or AE instruction;Therapeutic activities;Patient/family education;Balance training    OT Goals(Current goals can be found in the care plan section) Acute Rehab OT Goals Patient Stated Goal: home asap OT Goal Formulation: With patient Time For Goal Achievement: 02/13/22 Potential to Achieve Goals: Good ADL Goals Pt Will Perform Grooming: with modified independence;standing Pt Will Perform Upper Body Dressing: with modified independence Pt Will Perform Lower Body Dressing: with modified independence;sit to/from stand Pt Will Transfer to Toilet: with modified independence;ambulating  OT Frequency: Min 2X/week    Co-evaluation              AM-PAC OT "6  Clicks" Daily Activity     Outcome Measure Help from another person eating meals?: None Help from another person taking care of personal grooming?: A Little Help from another person toileting, which includes using toliet, bedpan, or urinal?: A Little Help from another person bathing (including washing, rinsing, drying)?: A Little Help from another person to put on and taking off regular upper body clothing?: None Help from another person to put on and taking off regular lower body clothing?: A Little 6 Click Score: 20   End of Session Equipment Utilized During Treatment: Gait belt Nurse Communication: Mobility status  Activity Tolerance: Patient tolerated treatment well Patient left: in chair;with call bell/phone within reach  OT Visit Diagnosis: Unsteadiness on feet (R26.81);Other abnormalities of gait and mobility (R26.89);Muscle weakness (generalized) (M62.81)                Time: 1350-1404 OT Time Calculation (min): 14 min Charges:  OT General Charges $OT Visit: 1 Visit OT Evaluation $OT Eval Moderate Complexity: 1 Mod  Rosario Duey D Causey 01/30/2022, 2:15 PM

## 2022-01-31 ENCOUNTER — Telehealth: Payer: Self-pay

## 2022-01-31 ENCOUNTER — Other Ambulatory Visit: Payer: Self-pay

## 2022-01-31 ENCOUNTER — Emergency Department (HOSPITAL_COMMUNITY): Payer: Medicare Other

## 2022-01-31 ENCOUNTER — Inpatient Hospital Stay (HOSPITAL_COMMUNITY)
Admission: EM | Admit: 2022-01-31 | Discharge: 2022-02-16 | DRG: 871 | Disposition: A | Discharge status: Hospice | Payer: Medicare Other | Attending: Internal Medicine | Admitting: Internal Medicine

## 2022-01-31 DIAGNOSIS — I4721 Torsades de pointes: Secondary | ICD-10-CM | POA: Diagnosis present

## 2022-01-31 DIAGNOSIS — R310 Gross hematuria: Secondary | ICD-10-CM | POA: Diagnosis not present

## 2022-01-31 DIAGNOSIS — I330 Acute and subacute infective endocarditis: Secondary | ICD-10-CM | POA: Diagnosis present

## 2022-01-31 DIAGNOSIS — F05 Delirium due to known physiological condition: Secondary | ICD-10-CM | POA: Diagnosis not present

## 2022-01-31 DIAGNOSIS — N179 Acute kidney failure, unspecified: Secondary | ICD-10-CM | POA: Diagnosis present

## 2022-01-31 DIAGNOSIS — I5022 Chronic systolic (congestive) heart failure: Secondary | ICD-10-CM | POA: Diagnosis present

## 2022-01-31 DIAGNOSIS — Z7952 Long term (current) use of systemic steroids: Secondary | ICD-10-CM

## 2022-01-31 DIAGNOSIS — L89311 Pressure ulcer of right buttock, stage 1: Secondary | ICD-10-CM | POA: Diagnosis present

## 2022-01-31 DIAGNOSIS — G9340 Encephalopathy, unspecified: Secondary | ICD-10-CM

## 2022-01-31 DIAGNOSIS — B9562 Methicillin resistant Staphylococcus aureus infection as the cause of diseases classified elsewhere: Secondary | ICD-10-CM | POA: Diagnosis not present

## 2022-01-31 DIAGNOSIS — J9601 Acute respiratory failure with hypoxia: Secondary | ICD-10-CM | POA: Diagnosis present

## 2022-01-31 DIAGNOSIS — F419 Anxiety disorder, unspecified: Secondary | ICD-10-CM | POA: Diagnosis present

## 2022-01-31 DIAGNOSIS — F32A Depression, unspecified: Secondary | ICD-10-CM | POA: Diagnosis present

## 2022-01-31 DIAGNOSIS — Z66 Do not resuscitate: Secondary | ICD-10-CM | POA: Diagnosis present

## 2022-01-31 DIAGNOSIS — E039 Hypothyroidism, unspecified: Secondary | ICD-10-CM | POA: Diagnosis present

## 2022-01-31 DIAGNOSIS — I441 Atrioventricular block, second degree: Secondary | ICD-10-CM | POA: Diagnosis present

## 2022-01-31 DIAGNOSIS — A4102 Sepsis due to Methicillin resistant Staphylococcus aureus: Secondary | ICD-10-CM | POA: Diagnosis present

## 2022-01-31 DIAGNOSIS — Z87891 Personal history of nicotine dependence: Secondary | ICD-10-CM

## 2022-01-31 DIAGNOSIS — M4646 Discitis, unspecified, lumbar region: Secondary | ICD-10-CM | POA: Diagnosis present

## 2022-01-31 DIAGNOSIS — L89321 Pressure ulcer of left buttock, stage 1: Secondary | ICD-10-CM | POA: Diagnosis present

## 2022-01-31 DIAGNOSIS — J1282 Pneumonia due to coronavirus disease 2019: Secondary | ICD-10-CM | POA: Diagnosis present

## 2022-01-31 DIAGNOSIS — N368 Other specified disorders of urethra: Secondary | ICD-10-CM | POA: Diagnosis not present

## 2022-01-31 DIAGNOSIS — L8990 Pressure ulcer of unspecified site, unspecified stage: Secondary | ICD-10-CM | POA: Insufficient documentation

## 2022-01-31 DIAGNOSIS — B372 Candidiasis of skin and nail: Secondary | ICD-10-CM | POA: Diagnosis present

## 2022-01-31 DIAGNOSIS — U071 COVID-19: Secondary | ICD-10-CM | POA: Diagnosis present

## 2022-01-31 DIAGNOSIS — R6520 Severe sepsis without septic shock: Secondary | ICD-10-CM | POA: Diagnosis present

## 2022-01-31 DIAGNOSIS — E874 Mixed disorder of acid-base balance: Secondary | ICD-10-CM | POA: Diagnosis present

## 2022-01-31 DIAGNOSIS — E871 Hypo-osmolality and hyponatremia: Secondary | ICD-10-CM | POA: Diagnosis present

## 2022-01-31 DIAGNOSIS — G9341 Metabolic encephalopathy: Secondary | ICD-10-CM | POA: Diagnosis present

## 2022-01-31 DIAGNOSIS — R7881 Bacteremia: Secondary | ICD-10-CM | POA: Diagnosis not present

## 2022-01-31 DIAGNOSIS — J690 Pneumonitis due to inhalation of food and vomit: Secondary | ICD-10-CM | POA: Diagnosis present

## 2022-01-31 DIAGNOSIS — R4182 Altered mental status, unspecified: Principal | ICD-10-CM

## 2022-01-31 DIAGNOSIS — N1832 Chronic kidney disease, stage 3b: Secondary | ICD-10-CM | POA: Diagnosis present

## 2022-01-31 DIAGNOSIS — R001 Bradycardia, unspecified: Secondary | ICD-10-CM | POA: Diagnosis present

## 2022-01-31 DIAGNOSIS — I447 Left bundle-branch block, unspecified: Secondary | ICD-10-CM | POA: Diagnosis present

## 2022-01-31 DIAGNOSIS — Z22322 Carrier or suspected carrier of Methicillin resistant Staphylococcus aureus: Secondary | ICD-10-CM

## 2022-01-31 DIAGNOSIS — M545 Low back pain, unspecified: Secondary | ICD-10-CM | POA: Diagnosis present

## 2022-01-31 DIAGNOSIS — Z7189 Other specified counseling: Secondary | ICD-10-CM | POA: Diagnosis not present

## 2022-01-31 DIAGNOSIS — Z79899 Other long term (current) drug therapy: Secondary | ICD-10-CM

## 2022-01-31 DIAGNOSIS — J9690 Respiratory failure, unspecified, unspecified whether with hypoxia or hypercapnia: Secondary | ICD-10-CM | POA: Diagnosis present

## 2022-01-31 DIAGNOSIS — I130 Hypertensive heart and chronic kidney disease with heart failure and stage 1 through stage 4 chronic kidney disease, or unspecified chronic kidney disease: Secondary | ICD-10-CM | POA: Diagnosis present

## 2022-01-31 DIAGNOSIS — K219 Gastro-esophageal reflux disease without esophagitis: Secondary | ICD-10-CM | POA: Diagnosis present

## 2022-01-31 DIAGNOSIS — R0902 Hypoxemia: Secondary | ICD-10-CM

## 2022-01-31 DIAGNOSIS — M1990 Unspecified osteoarthritis, unspecified site: Secondary | ICD-10-CM | POA: Diagnosis present

## 2022-01-31 DIAGNOSIS — N400 Enlarged prostate without lower urinary tract symptoms: Secondary | ICD-10-CM | POA: Diagnosis present

## 2022-01-31 DIAGNOSIS — Z9049 Acquired absence of other specified parts of digestive tract: Secondary | ICD-10-CM

## 2022-01-31 DIAGNOSIS — H9190 Unspecified hearing loss, unspecified ear: Secondary | ICD-10-CM | POA: Diagnosis present

## 2022-01-31 DIAGNOSIS — Z515 Encounter for palliative care: Secondary | ICD-10-CM | POA: Diagnosis not present

## 2022-01-31 DIAGNOSIS — Z793 Long term (current) use of hormonal contraceptives: Secondary | ICD-10-CM

## 2022-01-31 LAB — COMPREHENSIVE METABOLIC PANEL
ALT: 29 U/L (ref 0–44)
AST: 54 U/L — ABNORMAL HIGH (ref 15–41)
Albumin: 2.7 g/dL — ABNORMAL LOW (ref 3.5–5.0)
Alkaline Phosphatase: 73 U/L (ref 38–126)
Anion gap: 13 (ref 5–15)
BUN: 25 mg/dL — ABNORMAL HIGH (ref 8–23)
CO2: 21 mmol/L — ABNORMAL LOW (ref 22–32)
Calcium: 8.7 mg/dL — ABNORMAL LOW (ref 8.9–10.3)
Chloride: 103 mmol/L (ref 98–111)
Creatinine, Ser: 1.88 mg/dL — ABNORMAL HIGH (ref 0.61–1.24)
GFR, Estimated: 31 mL/min — ABNORMAL LOW (ref >60)
Glucose, Bld: 144 mg/dL — ABNORMAL HIGH (ref 70–99)
Potassium: 4.3 mmol/L (ref 3.5–5.1)
Sodium: 137 mmol/L (ref 135–145)
Total Bilirubin: 1.5 mg/dL — ABNORMAL HIGH (ref 0.3–1.2)
Total Protein: 6.7 g/dL (ref 6.5–8.1)

## 2022-01-31 LAB — URINALYSIS, ROUTINE W REFLEX MICROSCOPIC
Bacteria, UA: NONE SEEN
Bilirubin Urine: NEGATIVE
Glucose, UA: NEGATIVE mg/dL
Ketones, ur: NEGATIVE mg/dL
Leukocytes,Ua: NEGATIVE
Nitrite: NEGATIVE
Protein, ur: 100 mg/dL — AB
Specific Gravity, Urine: 1.014 (ref 1.005–1.030)
pH: 5 (ref 5.0–8.0)

## 2022-01-31 LAB — I-STAT ARTERIAL BLOOD GAS, ED
Acid-base deficit: 6 mmol/L — ABNORMAL HIGH (ref 0.0–2.0)
Bicarbonate: 15 mmol/L — ABNORMAL LOW (ref 20.0–28.0)
Calcium, Ion: 1.13 mmol/L — ABNORMAL LOW (ref 1.15–1.40)
HCT: 35 % — ABNORMAL LOW (ref 39.0–52.0)
Hemoglobin: 11.9 g/dL — ABNORMAL LOW (ref 13.0–17.0)
O2 Saturation: 94 %
Patient temperature: 102
Potassium: 4 mmol/L (ref 3.5–5.1)
Sodium: 135 mmol/L (ref 135–145)
TCO2: 16 mmol/L — ABNORMAL LOW (ref 22–32)
pCO2 arterial: 21.8 mmHg — ABNORMAL LOW (ref 32–48)
pH, Arterial: 7.454 — ABNORMAL HIGH (ref 7.35–7.45)
pO2, Arterial: 71 mmHg — ABNORMAL LOW (ref 83–108)

## 2022-01-31 LAB — CBC WITH DIFFERENTIAL/PLATELET
Abs Immature Granulocytes: 0 10*3/uL (ref 0.00–0.07)
Band Neutrophils: 2 %
Basophils Absolute: 0 10*3/uL (ref 0.0–0.1)
Basophils Relative: 0 %
Eosinophils Absolute: 0 10*3/uL (ref 0.0–0.5)
Eosinophils Relative: 0 %
HCT: 37.4 % — ABNORMAL LOW (ref 39.0–52.0)
Hemoglobin: 12.5 g/dL — ABNORMAL LOW (ref 13.0–17.0)
Lymphocytes Relative: 21 %
Lymphs Abs: 2.1 10*3/uL (ref 0.7–4.0)
MCH: 31.6 pg (ref 26.0–34.0)
MCHC: 33.4 g/dL (ref 30.0–36.0)
MCV: 94.7 fL (ref 80.0–100.0)
Monocytes Absolute: 0.2 10*3/uL (ref 0.1–1.0)
Monocytes Relative: 2 %
Neutro Abs: 7.5 10*3/uL (ref 1.7–7.7)
Neutrophils Relative %: 75 %
Platelets: 194 10*3/uL (ref 150–400)
RBC: 3.95 MIL/uL — ABNORMAL LOW (ref 4.22–5.81)
RDW: 14.3 % (ref 11.5–15.5)
WBC: 9.8 10*3/uL (ref 4.0–10.5)
nRBC: 0.2 % (ref 0.0–0.2)
nRBC: 1 /100 WBC — ABNORMAL HIGH

## 2022-01-31 LAB — C-REACTIVE PROTEIN: CRP: 17.4 mg/dL — ABNORMAL HIGH (ref <1.0)

## 2022-01-31 LAB — APTT: aPTT: 33 seconds (ref 24–36)

## 2022-01-31 LAB — HEMOGLOBIN A1C
Hgb A1c MFr Bld: 5.9 % — ABNORMAL HIGH (ref 4.8–5.6)
Mean Plasma Glucose: 122.63 mg/dL

## 2022-01-31 LAB — PROTIME-INR
INR: 1.3 — ABNORMAL HIGH (ref 0.8–1.2)
Prothrombin Time: 16.2 seconds — ABNORMAL HIGH (ref 11.4–15.2)

## 2022-01-31 LAB — RESP PANEL BY RT-PCR (FLU A&B, COVID) ARPGX2
Influenza A by PCR: NEGATIVE
Influenza B by PCR: NEGATIVE
SARS Coronavirus 2 by RT PCR: POSITIVE — AB

## 2022-01-31 LAB — PROCALCITONIN: Procalcitonin: 2.24 ng/mL

## 2022-01-31 LAB — LACTIC ACID, PLASMA
Lactic Acid, Venous: 4.4 mmol/L (ref 0.5–1.9)
Lactic Acid, Venous: 3.7 mmol/L (ref 0.5–1.9)

## 2022-01-31 LAB — MAGNESIUM: Magnesium: 2 mg/dL (ref 1.7–2.4)

## 2022-01-31 LAB — D-DIMER, QUANTITATIVE: D-Dimer, Quant: 16.33 ug/mL-FEU — ABNORMAL HIGH (ref 0.00–0.50)

## 2022-01-31 MED ORDER — MAGNESIUM SULFATE 50 % IJ SOLN
2.0000 g | Freq: Once | INTRAMUSCULAR | Status: AC
  Administered 2022-01-31: 2 g via INTRAVENOUS
  Filled 2022-01-31: qty 4

## 2022-01-31 MED ORDER — SODIUM CHLORIDE 0.9 % IV SOLN: 2.0000 g | INTRAVENOUS | Status: DC

## 2022-01-31 MED ORDER — CHLORHEXIDINE GLUCONATE CLOTH 2 % EX PADS
6.0000 | MEDICATED_PAD | Freq: Every day | CUTANEOUS | Status: DC
  Administered 2022-02-01 – 2022-02-16 (×14): 6 via TOPICAL

## 2022-01-31 MED ORDER — ACETAMINOPHEN 650 MG RE SUPP
650.0000 mg | Freq: Once | RECTAL | Status: AC
  Administered 2022-01-31: 650 mg via RECTAL
  Filled 2022-01-31: qty 1

## 2022-01-31 MED ORDER — SODIUM CHLORIDE 0.9 % IV SOLN
2.0000 g | Freq: Once | INTRAVENOUS | Status: AC
  Administered 2022-01-31: 2 g via INTRAVENOUS
  Filled 2022-01-31: qty 12.5

## 2022-01-31 MED ORDER — MAGNESIUM SULFATE 2 GM/50ML IV SOLN
2.0000 g | Freq: Once | INTRAVENOUS | Status: AC
  Administered 2022-01-31: 2 g via INTRAVENOUS

## 2022-01-31 MED ORDER — POLYETHYLENE GLYCOL 3350 17 G PO PACK: 17.0000 g | PACK | Freq: Every day | ORAL | Status: DC | PRN

## 2022-01-31 MED ORDER — INSULIN ASPART 100 UNIT/ML IJ SOLN
0.0000 [IU] | INTRAMUSCULAR | Status: DC
  Administered 2022-01-31: 2 [IU] via SUBCUTANEOUS
  Administered 2022-02-01 – 2022-02-02 (×6): 1 [IU] via SUBCUTANEOUS

## 2022-01-31 MED ORDER — DEXAMETHASONE SODIUM PHOSPHATE 10 MG/ML IJ SOLN
6.0000 mg | INTRAMUSCULAR | Status: DC
  Administered 2022-02-01 – 2022-02-07 (×8): 6 mg via INTRAVENOUS
  Filled 2022-01-31: qty 0.6
  Filled 2022-01-31: qty 1
  Filled 2022-01-31: qty 0.6
  Filled 2022-01-31: qty 1
  Filled 2022-01-31: qty 0.6
  Filled 2022-01-31: qty 1
  Filled 2022-01-31: qty 0.6
  Filled 2022-01-31 (×2): qty 1

## 2022-01-31 MED ORDER — DOCUSATE SODIUM 100 MG PO CAPS: 100.0000 mg | ORAL_CAPSULE | Freq: Two times a day (BID) | ORAL | Status: DC | PRN

## 2022-01-31 MED ORDER — SODIUM CHLORIDE 0.9 % IV SOLN: 100.0000 mg | Freq: Every day | INTRAVENOUS | Status: DC

## 2022-01-31 MED ORDER — VANCOMYCIN HCL IN DEXTROSE 1-5 GM/200ML-% IV SOLN
1000.0000 mg | Freq: Once | INTRAVENOUS | Status: DC
  Administered 2022-01-31: 1000 mg via INTRAVENOUS
  Filled 2022-01-31: qty 200

## 2022-01-31 MED ORDER — AMIODARONE HCL IN DEXTROSE 360-4.14 MG/200ML-% IV SOLN
60.0000 mg/h | INTRAVENOUS | Status: AC
  Administered 2022-01-31: 60 mg/h via INTRAVENOUS

## 2022-01-31 MED ORDER — LACTATED RINGERS IV SOLN: INTRAVENOUS | Status: AC

## 2022-01-31 MED ORDER — HEPARIN SODIUM (PORCINE) 5000 UNIT/ML IJ SOLN
5000.0000 [IU] | Freq: Three times a day (TID) | INTRAMUSCULAR | Status: DC
  Administered 2022-01-31 – 2022-02-16 (×47): 5000 [IU] via SUBCUTANEOUS
  Filled 2022-01-31 (×47): qty 1

## 2022-01-31 MED ORDER — VANCOMYCIN HCL 750 MG/150ML IV SOLN: 750.0000 mg | INTRAVENOUS | Status: DC

## 2022-01-31 MED ORDER — LACTATED RINGERS IV BOLUS (SEPSIS)
1000.0000 mL | Freq: Once | INTRAVENOUS | Status: AC
  Administered 2022-01-31: 1000 mL via INTRAVENOUS

## 2022-01-31 MED ORDER — VANCOMYCIN HCL 500 MG/100ML IV SOLN
500.0000 mg | Freq: Once | INTRAVENOUS | Status: AC
  Administered 2022-01-31: 500 mg via INTRAVENOUS
  Filled 2022-01-31: qty 100

## 2022-01-31 MED ORDER — METHYLPREDNISOLONE SODIUM SUCC 125 MG IJ SOLR
125.0000 mg | Freq: Once | INTRAMUSCULAR | Status: AC
  Administered 2022-01-31: 125 mg via INTRAVENOUS
  Filled 2022-01-31: qty 2

## 2022-01-31 MED ORDER — LEVOTHYROXINE SODIUM 50 MCG PO TABS
50.0000 ug | ORAL_TABLET | Freq: Every day | ORAL | Status: DC
  Administered 2022-02-02 – 2022-02-16 (×15): 50 ug via ORAL
  Filled 2022-01-31: qty 2
  Filled 2022-01-31 (×9): qty 1
  Filled 2022-01-31: qty 2
  Filled 2022-01-31 (×4): qty 1

## 2022-01-31 MED ORDER — VANCOMYCIN HCL 1500 MG/300ML IV SOLN
1500.0000 mg | Freq: Once | INTRAVENOUS | Status: DC
  Filled 2022-01-31: qty 300

## 2022-01-31 MED ORDER — AMIODARONE HCL IN DEXTROSE 360-4.14 MG/200ML-% IV SOLN
30.0000 mg/h | INTRAVENOUS | Status: DC
  Administered 2022-02-01: 30 mg/h via INTRAVENOUS
  Filled 2022-01-31: qty 200

## 2022-01-31 NOTE — H&P (Signed)
NAME:  Allen Wheeler, MRN:  [id number], DOB:  1919-12-04, LOS: 0 ADMISSION DATE:  01/31/2022, CONSULTATION DATE:  01/31/22 REFERRING MD:  EDP, CHIEF COMPLAINT:   AMS   History of Present Illness:  Allen Wheeler is a 88 y.o. M with PMH of GERD, depression/anxiety and discharged home yesterday after being hospitalized with Covid PNA.   He recently visited [place] to celebrate his 89th birthday and became ill after traveling home.  He was admitted 10/22-10/23 and treated with Molnupavir.  After discharge home, family states he initially seemed to be doing well, however slept most of the day today and was increasingly lethargic, so they returned to the ED.   On arrival he had runs of junctional rhythm and one of Vtach, no hypotension, he was minimally responsive and started on HFNC.  Labs significant for Lactic acid 4.4, procalcitonin 2.24, respiratory alkalosis, creatinine 1.88 and CXR with increasing RLL infiltrate.  Febrile up to 104F even after Tylenol.  He was given 1L LR, Vancomycin and Cefepime and started on Amiodarone gtt.    Code status discussed with patient's son, he states pt is still living independently and has been doing well up until contracting Covid and has lived a very full life.   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  However, given this is a potentially reversible infection, family would want intubation and then transition to comfort care if unable to extubate in a few days.  They would not want chest compressions or defibrillation.   Pertinent  Medical History   has a past medical history of Allergy, Anxiety, Chickenpox, Depression, Dizziness, nonspecific, GERD (gastroesophageal reflux disease), Hearing loss, Nephrolithiasis, Normal cardiac stress test (09-20-12), and Osteoarthritis.   Significant Hospital Events: Including procedures, antibiotic start and stop dates in addition  to other pertinent events   10/25 presented with sepsis and AMS, HFNC admit to PCCM  Interim History / Subjective:  Pt on 25L 100% with sats 90-92, remains altered   Objective   Blood pressure (!) 131/57, pulse (!) 44, temperature (!) 103.6 F (39.8 C), resp. rate 18, height '5\' 6"'$  (1.676 m), weight 72 kg, SpO2 93 %.    FiO2 (%):  [100 %] 100 %  No intake or output data in the 24 hours ending 01/31/22 2255 Filed Weights   01/31/22 2200  Weight: 72 kg    General:  well-nourished elderly M, no distress HEENT: MM pink/moist, sclera anicteric Neuro: opens eyes slightly to voice, otherwise not responding CV: s1s2 tachycardic, no m/r/g PULM:  scattered rhonchi, tachypneic, mild distress on HFNC GI: soft, non-distended Extremities: warm/dry, no edema  Skin: no rashes or lesions   Resolved Hospital Problem list     Assessment & Plan:    Acute Hypoxic Respiratory Failure Severe Sepsis with Acute Metabolic Encephalopathy Secondary to post-viral Pneumonia Lactic Acidosis 4.4->3.7 -continue HFNC, admit to ICU -continue cefepime and vancomycin -blood and respiratory cultures -start Dexamethasone '6mg'$  qd for covid and severe PNA - continue IVF received 30cc kg -closely monitor respiratory and mental status, may require intubation overnight     Acute Kidney Injury on CKD IIIb creatinine 1.88, up from yesterday at discharge -monitor renal indices and UOP after IVF, avoid nephrotoxins and ensure adequate renal perfusion    Vtach Junctional bradycardia Intermittent without hypotension LBBB not new -continue amiodarone -telemetry and echo -monitor electrolytes   Hypothyroidism -continue  synthroid      Best Practice (right click and "Reselect all SmartList Selections" daily)   Diet/type: NPO DVT prophylaxis: prophylactic heparin  GI prophylaxis: N/A Lines: N/A Foley:  Yes, and it is still needed Code Status:  limited, intubation only Last date of  multidisciplinary goals of care discussion [10/25 see IPAL note]  Labs   CBC: Recent Labs  Lab 01/28/22 0804 01/28/22 0848 01/28/22 0908 01/29/22 0322 01/30/22 0444 01/31/22 1822 01/31/22 1857  WBC 6.1  --   --  5.1 6.6 9.8  --   NEUTROABS  --   --   --  4.3 5.2 7.5  --   HGB 10.8*   < > 13.6 11.4* 11.5* 12.5* 11.9*  HCT 31.2*   < > 40.0 34.2* 33.4* 37.4* 35.0*  MCV 92.0  --   --  91.4 90.0 94.7  --   PLT 158  --   --  199 231 194  --    < > = values in this interval not displayed.     Basic Metabolic Panel: Recent Labs  Lab 01/28/22 0848 01/28/22 0908 01/29/22 0322 01/30/22 0444 01/31/22 1822 01/31/22 1857 01/31/22 2013  NA 135 135 135 137 137 135  --   K 3.8 3.5 4.9 4.3 4.3 4.0  --   CL 106  --  108 108 103  --   --   CO2  --   --  19* 20* 21*  --   --   GLUCOSE 99  --  141* 136* 144*  --   --   BUN 41*  --  27* 22 25*  --   --   CREATININE 1.80*  --  1.31* 1.24 1.88*  --   --   CALCIUM  --   --  8.3* 8.7* 8.7*  --   --   MG  --   --   --   --   --   --  2.0    GFR: Estimated Creatinine Clearance: 18.9 mL/min (A) (by C-G formula based on SCr of 1.88 mg/dL (H)). Recent Labs  Lab 01/28/22 0804 01/29/22 0322 01/29/22 1210 01/30/22 0444 01/31/22 1822 01/31/22 2013 01/31/22 2015  PROCALCITON  --   --  0.10  --   --  2.24  --   WBC 6.1 5.1  --  6.6 9.8  --   --   LATICACIDVEN 1.2  --   --   --  4.4*  --  3.7*     Liver Function Tests: Recent Labs  Lab 01/29/22 0322 01/30/22 0444 01/31/22 1822  AST 22 24 54*  ALT '18 18 29  '$ ALKPHOS 54 51 73  BILITOT 1.1 0.9 1.5*  PROT 6.5 6.4* 6.7  ALBUMIN 2.5* 2.6* 2.7*    No results for input(s): "LIPASE", "AMYLASE" in the last 168 hours. No results for input(s): "AMMONIA" in the last 168 hours.  ABG    Component Value Date/Time   PHART 7.454 (H) 01/31/2022 1857   PCO2ART 21.8 (L) 01/31/2022 1857   PO2ART 71 (L) 01/31/2022 1857   HCO3 15.0 (L) 01/31/2022 1857   TCO2 16 (L) 01/31/2022 1857   ACIDBASEDEF  6.0 (H) 01/31/2022 1857   O2SAT 94 01/31/2022 1857     Coagulation Profile: Recent Labs  Lab 01/31/22 1822  INR 1.3*     Cardiac Enzymes: No results for input(s): "CKTOTAL", "CKMB", "CKMBINDEX", "TROPONINI" in the last 168 hours.  HbA1C: No results found for: "HGBA1C"  CBG: No  results for input(s): "GLUCAP" in the last 168 hours.  Review of Systems:   Unable to obtain  Past Medical History:  He,  has a past medical history of Allergy, Anxiety, Chickenpox, Depression, Dizziness, nonspecific, GERD (gastroesophageal reflux disease), Hearing loss, Nephrolithiasis, Normal cardiac stress test (09-20-12), and Osteoarthritis.   Surgical History:   Past Surgical History:  Procedure Laterality Date   CATARACT EXTRACTION  2017   CHOLECYSTECTOMY N/A 02/18/2016   Procedure: LAPAROSCOPIC CHOLECYSTECTOMY WITH POSSIBLE INTRAOPERATIVE CHOLANGIOGRAM;  Surgeon: Mickeal Skinner, MD;  Location: [facility];  Service: General;  Laterality: N/A;   ERCP N/A 02/16/2016   Procedure: ENDOSCOPIC RETROGRADE CHOLANGIOPANCREATOGRAPHY (ERCP);  Surgeon: Teena Irani, MD;  Location: [facility];  Service: Endoscopy;  Laterality: N/A;   HERNIA REPAIR     wears truss   INGUINAL HERNIA REPAIR Right 06/21/2016   Procedure: OPEN RIGHT INGUINAL HERNIA REPAIR;  Surgeon: [name] Kinsinger, MD;  Location: [facility];  Service: General;  Laterality: Right;   INSERTION OF MESH Right 06/21/2016   Procedure: INSERTION OF MESH;  Surgeon: [name] Kinsinger, MD;  Location: [facility];  Service: General;  Laterality: Right;   MASTOIDECTOMY       Social History:   reports that he has quit smoking. He has never used smokeless tobacco. He reports current alcohol use of about 18.0 standard drinks of alcohol per week. He reports that he does not use drugs.   Family History:  His family history includes Colon cancer in his father; Heart disease in his mother.   Allergies No Known Allergies   Home Medications  Prior to  Admission medications   Medication Sig Start Date End Date Taking? Authorizing Provider  citalopram (CELEXA) 10 MG tablet TAKE ONE TABLET ONCE DAILY Patient taking differently: Take 10 mg by mouth in the morning. 09/22/21   [name], MD  diphenoxylate-atropine (LOMOTIL) 2.5-0.025 MG tablet TAKE ONE OR TWO TABLETS BY MOUTH EVERY MORNING Patient taking differently: Take 1-2 tablets by mouth daily as needed for diarrhea or loose stools. 10/27/21   [name], MD  gabapentin (NEURONTIN) 100 MG capsule Take 2 capsules (200 mg total) by mouth at bedtime. 08/22/21   [name], MD  hydrOXYzine (VISTARIL) 25 MG capsule TAKE ONE CAPSULE BY MOUTH EVERY 6 HOURS AS NEEDED FOR ITCHING Patient taking differently: Take 25 mg by mouth every 6 (six) hours as needed for itching. 09/27/21   [name], MD  levothyroxine (SYNTHROID) 50 MCG tablet TAKE 1 TABLET ONCE DAILY. Patient taking differently: Take 50 mcg by mouth daily before breakfast. 01/10/21   [name], MD  LORazepam (ATIVAN) 1 MG tablet Take 1 tablet (1 mg total) by mouth at bedtime as needed. Patient taking differently: Take 1 mg by mouth at bedtime as needed for anxiety or sleep. 09/26/21   [name], MD  molnupiravir EUA (LAGEVRIO) 200 mg CAPS capsule Take 4 capsules (800 mg total) by mouth 2 (two) times daily for 3 days. 01/30/22 02/02/22  Shawna Clamp, MD  Multiple Vitamins-Minerals (CENTRUM SILVER ADULT 50+) TABS Take 1 tablet by mouth in the morning.    [provider]  nystatin (MYCOSTATIN/NYSTOP) powder Apply topically 3 (three) times daily. 01/30/22   Shawna Clamp, MD  omeprazole (PRILOSEC) 40 MG capsule TAKE 1 CAPSULE DAILY. Patient taking differently: Take 40 mg by mouth in the morning. 01/10/21   [name], MD  predniSONE (DELTASONE) 10 MG tablet TAKE 1 TABLET ONCE DAILY. Patient taking differently:  Take 10 mg by mouth in the morning. 04/12/21   [name], MD  tamsulosin (FLOMAX) 0.4 MG CAPS capsule  TAKE TWO CAPSULES BY MOUTH DAILY Patient taking differently: Take 0.8 mg by mouth at bedtime. 03/07/21   [name], MD     Critical care time: 45 minutes     CRITICAL CARE Performed by: Otilio Carpen Aily Tzeng   Total critical care time: 45 minutes  Critical care time was exclusive of separately billable procedures and treating other patients.  Critical care was necessary to treat or prevent imminent or life-threatening deterioration.  Critical care was time spent personally by me on the following activities: development of treatment plan with patient and/or surrogate as well as nursing, discussions with consultants, evaluation of patient's response to treatment, examination of patient, obtaining history from patient or surrogate, ordering and performing treatments and interventions, ordering and review of laboratory studies, ordering and review of radiographic studies, pulse oximetry and re-evaluation of patient's condition.  Otilio Carpen Aarianna Hoadley, PA-C [facility] See Amion for pager If no response to pager , please call [phone] until 7pm After 7:00 pm call Elink  [phone]

## 2022-01-31 NOTE — Progress Notes (Signed)
RT called to room due to patient having desaturation on [place] and NRB.  RT applied HHFNC at 25L with 100% FiO2.  Patient O2 saturation was 88%;RT added the NRB in conjunction with the [place].  Patient O2 sats 91%.  RN aware.

## 2022-01-31 NOTE — Telephone Encounter (Signed)
Transition Care Management Unsuccessful Follow-up Telephone Call  Date of discharge and from where:  [facility] 01-30-22 Dx: [id number]  Attempts:  1st Attempt  Reason for unsuccessful TCM follow-up call:  Unable to leave message   Juanda Crumble LPN [facility] Direct Dial [phone]   Transition Care Management Unsuccessful Follow-up Telephone Call  Date of discharge and from where:  [facility] 01-30-22 Dx: COVID-19  Attempts:  2nd Attempt  Reason for unsuccessful TCM follow-up call:  Unable to leave message  Per Son pt is at [facility] in ICU- son will call if and when pt is discharged and make fu appt- cancelled fu appt on 02-06-22

## 2022-01-31 NOTE — ED Triage Notes (Signed)
Pt BIB EMS from home. Pt was unresponsive GCS of 9. Pt warm to the touch in a junctional rhythm of 47-55 then had a 15 second run of Vtach. Pt returned to junctional rhythm. Pt's GCS improved to 12 en route. Pt given Mag and 500cc of mag.   152/75 28 RR  Mid 80's RA

## 2022-01-31 NOTE — ED Notes (Signed)
Pt enter recurring episodes of Vtach and Torsades. EDP notified new medication ordered. EKG obtained

## 2022-01-31 NOTE — Sepsis Progress Note (Signed)
eLink monitoring code sepsis.  

## 2022-01-31 NOTE — ED Notes (Signed)
Critical Care MD at bedside speaking with the pt's family

## 2022-01-31 NOTE — Plan of Care (Signed)
  Interdisciplinary Goals of Care Family Meeting   Date carried out:: 01/31/2022  Location of the meeting: Bedside  Member's involved: Family Member or next of kin and Other: Chartered loss adjuster or acting medical decision maker: [name]  Allen Wheeler  Discussion: We discussed goals of care for [name] .   Code status discussed with patient's son, he states pt is still living independently and has been doing well up until contracting Covid.  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  However, given this is a potentially reversible infection, family would want intubation and then transition to comfort care if unable to extubate in a few days.  They would not want chest compressions or defibrillation.   Code status: Limited Code or DNR with short term  Disposition: Continue current acute care   Time spent for the meeting: 15 minutes  Allen Wheeler [name] 01/31/2022, 10:56 PM

## 2022-01-31 NOTE — Sepsis Progress Note (Signed)
Notified provider of need to order fluid bolus.  Also messaged bedside RN requesting weight be placed into Epic.

## 2022-01-31 NOTE — H&P (Signed)
NAME:  Allen Wheeler, MRN:  [id number], DOB:  04/23/1919, LOS: 0 ADMISSION DATE:  01/31/2022, CONSULTATION DATE:  01/31/22 REFERRING MD:  EDP, CHIEF COMPLAINT:   AMS   History of Present Illness:  Allen Wheeler is a 88 y.o. M with PMH of GERD, depression/anxiety and discharged home yesterday after being hospitalized with Covid PNA.   He recently visited [place] to celebrate his 89th birthday and became ill after traveling home.  He was admitted 10/22-10/23 and treated with Molnupavir.  After discharge home, family states he initially seemed to be doing well, however slept most of the day today and was increasingly lethargic, so they returned to the ED.   On arrival he had runs of junctional rhythm and one of Vtach, no hypotension, he was minimally responsive and started on HFNC.  Labs significant for Lactic acid 4.4, procalcitonin 2.24, respiratory alkalosis, creatinine 1.88 and CXR with increasing RLL infiltrate.  Febrile up to 104F even after Tylenol.  He was given 1L LR, Vancomycin and Cefepime and started on Amiodarone gtt.    Code status discussed with patient's son, he states pt is still living independently and has been doing well up until contracting Covid and has lived a very full life.   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  However, given this is a potentially reversible infection, family would want intubation and then transition to comfort care if unable to extubate in a few days.  They would not want chest compressions or defibrillation.   Pertinent  Medical History   has a past medical history of Allergy, Anxiety, Chickenpox, Depression, Dizziness, nonspecific, GERD (gastroesophageal reflux disease), Hearing loss, Nephrolithiasis, Normal cardiac stress test (09-20-12), and Osteoarthritis.   Significant Hospital Events: Including procedures, antibiotic start and stop dates in addition  to other pertinent events   10/25 presented with sepsis and AMS, HFNC admit to PCCM  Interim History / Subjective:  Pt on 25L 100% with sats 90-92, remains altered   Objective   Blood pressure (!) 168/58, pulse (!) 56, temperature (!) 102.9 F (39.4 C), temperature source Rectal, resp. rate (!) 26, SpO2 91 %.    FiO2 (%):  [100 %] 100 %  No intake or output data in the 24 hours ending 01/31/22 2033 There were no vitals filed for this visit.  General:  well-nourished elderly M, no distress HEENT: MM pink/moist, sclera anicteric Neuro: opens eyes slightly to voice, otherwise not responding CV: s1s2 tachycardic, no m/r/g PULM:  scattered rhonchi, tachypneic, mild distress on HFNC GI: soft, non-distended Extremities: warm/dry, no edema  Skin: no rashes or lesions   Resolved Hospital Problem list     Assessment & Plan:    Acute Hypoxic Respiratory Failure Severe Sepsis with Acute Encephalopathy Secondary to post-viral Pneumonia Lactic Acidosis 4.4->3.7 -continue HFNC, admit to ICU -continue cefepime and vancomycin -blood and respiratory cultures -start Dexamethasone '6mg'$  qd for covid and severe PNA - continue IVF received 30cc kg    Acute Kidney Injury on CKD IIIb creatinine 1.88, up from yesterday at discharge -monitor renal indices and UOP after IVF, avoid nephrotoxins and ensure adequate renal perfusion    Vtach Junctional bradycardia Intermittent without hypotension LBBB not new -continue amiodarone -telemetry and echo   Hypothyroidism -continue synthroid      Best Practice (right click and "Reselect all SmartList Selections" daily)   Diet/type: NPO DVT prophylaxis:  prophylactic heparin  GI prophylaxis: N/A Lines: N/A Foley:  Yes, and it is still needed Code Status:  limited, intubation only Last date of multidisciplinary goals of care discussion [10/25 see IPAL note]  Labs   CBC: Recent Labs  Lab 01/28/22 0804 01/28/22 0848 01/28/22 0908  01/29/22 0322 01/30/22 0444 01/31/22 1822 01/31/22 1857  WBC 6.1  --   --  5.1 6.6 9.8  --   NEUTROABS  --   --   --  4.3 5.2 7.5  --   HGB 10.8*   < > 13.6 11.4* 11.5* 12.5* 11.9*  HCT 31.2*   < > 40.0 34.2* 33.4* 37.4* 35.0*  MCV 92.0  --   --  91.4 90.0 94.7  --   PLT 158  --   --  199 231 194  --    < > = values in this interval not displayed.    Basic Metabolic Panel: Recent Labs  Lab 01/28/22 0848 01/28/22 0908 01/29/22 0322 01/30/22 0444 01/31/22 1822 01/31/22 1857  NA 135 135 135 137 137 135  K 3.8 3.5 4.9 4.3 4.3 4.0  CL 106  --  108 108 103  --   CO2  --   --  19* 20* 21*  --   GLUCOSE 99  --  141* 136* 144*  --   BUN 41*  --  27* 22 25*  --   CREATININE 1.80*  --  1.31* 1.24 1.88*  --   CALCIUM  --   --  8.3* 8.7* 8.7*  --    GFR: CrCl cannot be calculated (Unknown ideal weight.). Recent Labs  Lab 01/28/22 0804 01/29/22 0322 01/29/22 1210 01/30/22 0444 01/31/22 1822  PROCALCITON  --   --  0.10  --   --   WBC 6.1 5.1  --  6.6 9.8  LATICACIDVEN 1.2  --   --   --  4.4*    Liver Function Tests: Recent Labs  Lab 01/29/22 0322 01/30/22 0444 01/31/22 1822  AST 22 24 54*  ALT '18 18 29  '$ ALKPHOS 54 51 73  BILITOT 1.1 0.9 1.5*  PROT 6.5 6.4* 6.7  ALBUMIN 2.5* 2.6* 2.7*   No results for input(s): "LIPASE", "AMYLASE" in the last 168 hours. No results for input(s): "AMMONIA" in the last 168 hours.  ABG    Component Value Date/Time   PHART 7.454 (H) 01/31/2022 1857   PCO2ART 21.8 (L) 01/31/2022 1857   PO2ART 71 (L) 01/31/2022 1857   HCO3 15.0 (L) 01/31/2022 1857   TCO2 16 (L) 01/31/2022 1857   ACIDBASEDEF 6.0 (H) 01/31/2022 1857   O2SAT 94 01/31/2022 1857     Coagulation Profile: Recent Labs  Lab 01/31/22 1822  INR 1.3*    Cardiac Enzymes: No results for input(s): "CKTOTAL", "CKMB", "CKMBINDEX", "TROPONINI" in the last 168 hours.  HbA1C: No results found for: "HGBA1C"  CBG: No results for input(s): "GLUCAP" in the last 168  hours.  Review of Systems:   Unable to obtain  Past Medical History:  He,  has a past medical history of Allergy, Anxiety, Chickenpox, Depression, Dizziness, nonspecific, GERD (gastroesophageal reflux disease), Hearing loss, Nephrolithiasis, Normal cardiac stress test (09-20-12), and Osteoarthritis.   Surgical History:   Past Surgical History:  Procedure Laterality Date   CATARACT EXTRACTION  2017   CHOLECYSTECTOMY N/A 02/18/2016   Procedure: LAPAROSCOPIC CHOLECYSTECTOMY WITH POSSIBLE INTRAOPERATIVE CHOLANGIOGRAM;  Surgeon: Mickeal Skinner, MD;  Location: [facility];  Service: General;  Laterality: N/A;  ERCP N/A 02/16/2016   Procedure: ENDOSCOPIC RETROGRADE CHOLANGIOPANCREATOGRAPHY (ERCP);  Surgeon: Teena Irani, MD;  Location: [facility];  Service: Endoscopy;  Laterality: N/A;   HERNIA REPAIR     wears truss   INGUINAL HERNIA REPAIR Right 06/21/2016   Procedure: OPEN RIGHT INGUINAL HERNIA REPAIR;  Surgeon: [name] Kinsinger, MD;  Location: [facility];  Service: General;  Laterality: Right;   INSERTION OF MESH Right 06/21/2016   Procedure: INSERTION OF MESH;  Surgeon: [name] Kinsinger, MD;  Location: [facility];  Service: General;  Laterality: Right;   MASTOIDECTOMY       Social History:   reports that he has quit smoking. He has never used smokeless tobacco. He reports current alcohol use of about 18.0 standard drinks of alcohol per week. He reports that he does not use drugs.   Family History:  His family history includes Colon cancer in his father; Heart disease in his mother.   Allergies No Known Allergies   Home Medications  Prior to Admission medications   Medication Sig Start Date End Date Taking? Authorizing Provider  citalopram (CELEXA) 10 MG tablet TAKE ONE TABLET ONCE DAILY Patient taking differently: Take 10 mg by mouth in the morning. 09/22/21   [name], MD  diphenoxylate-atropine (LOMOTIL) 2.5-0.025 MG tablet TAKE ONE OR TWO TABLETS BY MOUTH EVERY  MORNING Patient taking differently: Take 1-2 tablets by mouth daily as needed for diarrhea or loose stools. 10/27/21   [name], MD  gabapentin (NEURONTIN) 100 MG capsule Take 2 capsules (200 mg total) by mouth at bedtime. 08/22/21   [name], MD  hydrOXYzine (VISTARIL) 25 MG capsule TAKE ONE CAPSULE BY MOUTH EVERY 6 HOURS AS NEEDED FOR ITCHING Patient taking differently: Take 25 mg by mouth every 6 (six) hours as needed for itching. 09/27/21   [name], MD  levothyroxine (SYNTHROID) 50 MCG tablet TAKE 1 TABLET ONCE DAILY. Patient taking differently: Take 50 mcg by mouth daily before breakfast. 01/10/21   [name], MD  LORazepam (ATIVAN) 1 MG tablet Take 1 tablet (1 mg total) by mouth at bedtime as needed. Patient taking differently: Take 1 mg by mouth at bedtime as needed for anxiety or sleep. 09/26/21   [name], MD  molnupiravir EUA (LAGEVRIO) 200 mg CAPS capsule Take 4 capsules (800 mg total) by mouth 2 (two) times daily for 3 days. 01/30/22 02/02/22  Shawna Clamp, MD  Multiple Vitamins-Minerals (CENTRUM SILVER ADULT 50+) TABS Take 1 tablet by mouth in the morning.    [provider]  nystatin (MYCOSTATIN/NYSTOP) powder Apply topically 3 (three) times daily. 01/30/22   Shawna Clamp, MD  omeprazole (PRILOSEC) 40 MG capsule TAKE 1 CAPSULE DAILY. Patient taking differently: Take 40 mg by mouth in the morning. 01/10/21   [name], MD  predniSONE (DELTASONE) 10 MG tablet TAKE 1 TABLET ONCE DAILY. Patient taking differently: Take 10 mg by mouth in the morning. 04/12/21   [name], MD  tamsulosin (FLOMAX) 0.4 MG CAPS capsule TAKE TWO CAPSULES BY MOUTH DAILY Patient taking differently: Take 0.8 mg by mouth at bedtime. 03/07/21   [name], MD     Critical care time: 45 minutes     CRITICAL CARE Performed by: Otilio Carpen Jeri Rawlins   Total critical care time: 45 minutes  Critical care time was exclusive of separately billable procedures and  treating other patients.  Critical care was necessary to treat or prevent imminent or life-threatening deterioration.  Critical care was time spent personally by me on the following activities: development of treatment plan with patient and/or surrogate as well as nursing, discussions with consultants, evaluation of patient's response to treatment, examination of patient, obtaining history from patient or surrogate, ordering and performing treatments and interventions, ordering and review of laboratory studies, ordering and review of radiographic studies, pulse oximetry and re-evaluation of patient's condition.  Otilio Carpen Jakeya Gherardi, PA-C [facility] See Amion for pager If no response to pager , please call [phone] until 7pm After 7:00 pm call Elink  [phone]?[place]

## 2022-01-31 NOTE — Progress Notes (Addendum)
Pharmacy Antibiotic Note  Allen Wheeler is a 88 y.o. male admitted on 01/31/2022 with sepsis. Pharmacy has been consulted for vancomycin and cefepime dosing.  Plan: Vancomycin '1500mg'$  IV once then '750mg'$  Q36h (eAUC 481.1, Scr 1.88) Cefepime 2g Q24h Height: '5\' 6"'$  (167.6 cm) Weight: 72 kg (158 lb 11.7 oz) IBW/kg (Calculated) : 63.8  Temp (24hrs), Avg:103.4 F (39.7 C), Min:102.4 F (39.1 C), Max:104.7 F (40.4 C)  Recent Labs  Lab 01/28/22 0804 01/28/22 0848 01/29/22 0322 01/30/22 0444 01/31/22 1822 01/31/22 2015  WBC 6.1  --  5.1 6.6 9.8  --   CREATININE  --  1.80* 1.31* 1.24 1.88*  --   LATICACIDVEN 1.2  --   --   --  4.4* 3.7*    Estimated Creatinine Clearance: 18.9 mL/min (A) (by C-G formula based on SCr of 1.88 mg/dL (H)).    No Known Allergies  Antimicrobials this admission: Vanc 10/25 >>  Cefepime 10/25 >>   Dose adjustments this admission: N/a  Microbiology results: 10/25 BCx: Collected 10/25 Resp panel: Collected   Thank you for allowing pharmacy to be a part of this patient's care.  Titus Dubin, PharmD PGY1 Pharmacy Resident 01/31/2022 10:48 PM

## 2022-01-31 NOTE — ED Provider Notes (Signed)
[facility] DEPART[facility] Provider Note   CSN: [id number] Arrival date & time: 01/31/22  1820     History  Chief Complaint  Patient presents with   Code Sepsis   HPI Allen Wheeler is a 88 y.o. male with GERD and osteoarthritis and recent diagnosis of COVID with associated pneumonia last week presenting for unresponsiveness at home.  Patient was unresponsive at home.  EMS was called on arrival GCS was 9.  Initially in a junctional rhythm with a rate of 47-55 and later developed what appeared to be ventricular tachycardia.  Per EMS, sustained runs of 15-30 beats.  Patient spontaneously returned to junctional rhythm.  In route patient improved GCS of 12.  Per ED attending instructions, was given IV magnesium bolus and amnio bolus.  Patient was also hypoxic and placed on nonrebreather.  Per his son, patient was discharged from the hospital with COVID and viral pneumonia.  Per son, patient is normally very healthy for his age, talkative and able to ambulate on his own around the house.  In the last 24 hours, the son noted that he was generally unwell.  He slept through the night and and did not wake up till 4 PM the next day.  Son stated this is very unusual for him.  When he awoke he was not acting her normal self and his mental state declined quickly.  This prompted call to EMS.      Home Medications Prior to Admission medications   Medication Sig Start Date End Date Taking? Authorizing Provider  citalopram (CELEXA) 10 MG tablet TAKE ONE TABLET ONCE DAILY Patient taking differently: Take 10 mg by mouth in the morning. 09/22/21   [name], MD  diphenoxylate-atropine (LOMOTIL) 2.5-0.025 MG tablet TAKE ONE OR TWO TABLETS BY MOUTH EVERY MORNING Patient taking differently: Take 1-2 tablets by mouth daily as needed for diarrhea or loose stools. 10/27/21   [name], MD  gabapentin (NEURONTIN) 100 MG capsule Take 2 capsules (200 mg total) by mouth at bedtime.  08/22/21   [name], MD  hydrOXYzine (VISTARIL) 25 MG capsule TAKE ONE CAPSULE BY MOUTH EVERY 6 HOURS AS NEEDED FOR ITCHING Patient taking differently: Take 25 mg by mouth every 6 (six) hours as needed for itching. 09/27/21   [name], MD  levothyroxine (SYNTHROID) 50 MCG tablet TAKE 1 TABLET ONCE DAILY. Patient taking differently: Take 50 mcg by mouth daily before breakfast. 01/10/21   [name], MD  LORazepam (ATIVAN) 1 MG tablet Take 1 tablet (1 mg total) by mouth at bedtime as needed. Patient taking differently: Take 1 mg by mouth at bedtime as needed for anxiety or sleep. 09/26/21   [name], MD  molnupiravir EUA (LAGEVRIO) 200 mg CAPS capsule Take 4 capsules (800 mg total) by mouth 2 (two) times daily for 3 days. 01/30/22 02/02/22  Shawna Clamp, MD  Multiple Vitamins-Minerals (CENTRUM SILVER ADULT 50+) TABS Take 1 tablet by mouth in the morning.    [provider]  nystatin (MYCOSTATIN/NYSTOP) powder Apply topically 3 (three) times daily. 01/30/22   Shawna Clamp, MD  omeprazole (PRILOSEC) 40 MG capsule TAKE 1 CAPSULE DAILY. Patient taking differently: Take 40 mg by mouth in the morning. 01/10/21   [name], MD  predniSONE (DELTASONE) 10 MG tablet TAKE 1 TABLET ONCE DAILY. Patient taking differently: Take 10 mg by mouth in the morning. 04/12/21   [name], MD  tamsulosin (FLOMAX) 0.4 MG CAPS  capsule TAKE TWO CAPSULES BY MOUTH DAILY Patient taking differently: Take 0.8 mg by mouth at bedtime. 03/07/21   [name], MD      Allergies    Patient has no known allergies.    Review of Systems   Review of Systems  Constitutional:        Altered mental status    Physical Exam Updated Vital Signs BP (!) 155/64 (BP Location: Right Arm)   Pulse (!) 43   Temp (!) 103.6 F (39.8 C) (Bladder)   Resp (!) 27   Ht '5\' 6"'$  (1.676 m)   Wt 65.6 kg   SpO2 93%   BMI 23.34 kg/m  Physical Exam Constitutional:      Appearance: He is ill-appearing and  toxic-appearing. He is not diaphoretic.  HENT:     Head: Normocephalic and atraumatic.     Nose: Nose normal.     Mouth/Throat:     Mouth: Mucous membranes are dry.     Pharynx: No oropharyngeal exudate or posterior oropharyngeal erythema.  Cardiovascular:     Rate and Rhythm: Bradycardia present. Rhythm irregular.     Pulses:          Radial pulses are 2+ on the right side and 2+ on the left side.  Pulmonary:     Effort: Pulmonary effort is normal.     Breath sounds: Decreased breath sounds present. No wheezing, rhonchi or rales.     Comments: Sats consistently in the low 90s on nonrebreather Abdominal:     General: Abdomen is protuberant.     Palpations: Abdomen is soft.  Skin:    Capillary Refill: Capillary refill takes 2 to 3 seconds.     Coloration: Skin is pale.  Neurological:     Mental Status: He is easily aroused. He is lethargic.     ED Results / Procedures / Treatments   Labs (all labs ordered are listed, but only abnormal results are displayed) Labs Reviewed  RESP PANEL BY RT-PCR (FLU A&B, COVID) ARPGX2 - Abnormal; Notable for the following components:      Result Value   SARS Coronavirus 2 by RT PCR POSITIVE (*)    All other components within normal limits  LACTIC ACID, PLASMA - Abnormal; Notable for the following components:   Lactic Acid, Venous 4.4 (*)    All other components within normal limits  LACTIC ACID, PLASMA - Abnormal; Notable for the following components:   Lactic Acid, Venous 3.7 (*)    All other components within normal limits  COMPREHENSIVE METABOLIC PANEL - Abnormal; Notable for the following components:   CO2 21 (*)    Glucose, Bld 144 (*)    BUN 25 (*)    Creatinine, Ser 1.88 (*)    Calcium 8.7 (*)    Albumin 2.7 (*)    AST 54 (*)    Total Bilirubin 1.5 (*)    GFR, Estimated 31 (*)    All other components within normal limits  CBC WITH DIFFERENTIAL/PLATELET - Abnormal; Notable for the following components:   RBC 3.95 (*)     Hemoglobin 12.5 (*)    HCT 37.4 (*)    nRBC 1 (*)    All other components within normal limits  PROTIME-INR - Abnormal; Notable for the following components:   Prothrombin Time 16.2 (*)    INR 1.3 (*)    All other components within normal limits  URINALYSIS, ROUTINE W REFLEX MICROSCOPIC - Abnormal; Notable for the following components:  Hgb urine dipstick SMALL (*)    Protein, ur 100 (*)    All other components within normal limits  C-REACTIVE PROTEIN - Abnormal; Notable for the following components:   CRP 17.4 (*)    All other components within normal limits  D-DIMER, QUANTITATIVE - Abnormal; Notable for the following components:   D-Dimer, Quant 16.33 (*)    All other components within normal limits  HEMOGLOBIN A1C - Abnormal; Notable for the following components:   Hgb A1c MFr Bld 5.9 (*)    All other components within normal limits  I-STAT ARTERIAL BLOOD GAS, ED - Abnormal; Notable for the following components:   pH, Arterial 7.454 (*)    pCO2 arterial 21.8 (*)    pO2, Arterial 71 (*)    Bicarbonate 15.0 (*)    TCO2 16 (*)    Acid-base deficit 6.0 (*)    Calcium, Ion 1.13 (*)    HCT 35.0 (*)    Hemoglobin 11.9 (*)    All other components within normal limits  CULTURE, BLOOD (ROUTINE X 2)  CULTURE, BLOOD (ROUTINE X 2)  URINE CULTURE  MRSA NEXT GEN BY PCR, NASAL  APTT  MAGNESIUM  PROCALCITONIN    EKG None  Radiology DG Chest Port 1 View  Result Date: 01/31/2022 CLINICAL DATA:  Questionable sepsis. Evaluate for abnormality. Patient was unresponsive. EXAM: PORTABLE CHEST 1 VIEW COMPARISON:  01/28/2022 FINDINGS: Shallow inspiration. Mild cardiac enlargement. Increasing basilar infiltrates in the lungs, more on the right, since prior study. Changes suggest developing pneumonia. Aspiration could also have this appearance. No pleural effusions. No pneumothorax. Mediastinal contours appear intact. Degenerative changes in the spine and shoulders. IMPRESSION: Cardiac  enlargement. Increasing infiltrates in the lung bases, particularly on the right. Electronically Signed   By: Lucienne Capers M.D.   On: 01/31/2022 19:03    Procedures Procedures    Medications Ordered in ED Medications  lactated ringers infusion ( Intravenous Rate/Dose Change 01/31/22 2310)  amiodarone (NEXTERONE PREMIX) 360-4.14 MG/200ML-% (1.8 mg/mL) IV infusion (30.06 mg/hr Intravenous Infusion Verify 01/31/22 2300)  amiodarone (NEXTERONE PREMIX) 360-4.14 MG/200ML-% (1.8 mg/mL) IV infusion (has no administration in time range)  levothyroxine (SYNTHROID) tablet 50 mcg (has no administration in time range)  docusate sodium (COLACE) capsule 100 mg (has no administration in time range)  polyethylene glycol (MIRALAX / GLYCOLAX) packet 17 g (has no administration in time range)  heparin injection 5,000 Units (5,000 Units Subcutaneous Given 01/31/22 2319)  dexamethasone (DECADRON) injection 6 mg (has no administration in time range)  insulin aspart (novoLOG) injection 0-9 Units (2 Units Subcutaneous Given 01/31/22 2319)  ceFEPIme (MAXIPIME) 2 g in sodium chloride 0.9 % 100 mL IVPB (has no administration in time range)  vancomycin (VANCOREADY) IVPB 750 mg/150 mL (has no administration in time range)  Chlorhexidine Gluconate Cloth 2 % PADS 6 each (has no administration in time range)  ceFEPIme (MAXIPIME) 2 g in sodium chloride 0.9 % 100 mL IVPB (0 g Intravenous Stopped 01/31/22 1915)  lactated ringers bolus 1,000 mL (0 mLs Intravenous Stopped 01/31/22 2003)  acetaminophen (TYLENOL) suppository 650 mg (650 mg Rectal Given 01/31/22 1852)  vancomycin (VANCOREADY) IVPB 500 mg/100 mL (0 mg Intravenous Stopped 01/31/22 2107)  magnesium sulfate IVPB 2 g 50 mL (0 g Intravenous Stopped 01/31/22 2144)  methylPREDNISolone sodium succinate (SOLU-MEDROL) 125 mg/2 mL injection 125 mg (125 mg Intravenous Given 01/31/22 2101)  magnesium sulfate (IV Push/IM) injection 2 g (2 g Intravenous Given 01/31/22 2104)     ED Course/ Medical  Decision Making/ A&P                           Medical Decision Making Amount and/or Complexity of Data Reviewed Labs: ordered. Radiology: ordered. ECG/medicine tests: ordered.  Risk OTC drugs. Prescription drug management.  This patient presents to the ED for concern of altered mental status, this involves a number of treatment options, and is a complaint that carries with it a high risk of complications and morbidity.  The differential diagnosis includes sepsis, pneumonia, electrolyte derangement, and ACS.   Co morbidities: Discussed in HPI   EMR reviewed including pt PMHx, past surgical history and past visits to ER. Per chart review, patient was discharged from the hospital and was diagnosed  See HPI for more details   Lab Tests:   I ordered and independently interpreted labs. Labs notable for lactic acidosis, elevated D-dimer, alkalemia, and COVID-positive   Imaging Studies:  Abnormal findings. I personally reviewed all imaging studies. Imaging notable for cardiac enlargement and pulmonary infiltrates.    Cardiac Monitoring:  The patient was maintained on a cardiac monitor.  I personally viewed and interpreted the cardiac monitored which showed an underlying rhythm of: Junctional bradycardia  EKG  revealed concern for torsades de pointes   Medicines ordered:  I ordered medication including magnesium for torsades, vancomycin and cefepime for sepsis prophylaxis, IV Solu-Medrol and remdesivir for COVID infection, LR bolus for volume resuscitation Reevaluation of the patient after these medicines showed that the patient stayed the same I have reviewed the patients home medicines and have made adjustments as needed   Critical Interventions:   management for sepsis and respiratory failure   Consults/Attending Physician   I requested consultation with ICU,  and discussed lab and imaging findings as well as pertinent plan - they  recommend: Recommend admission for respiratory failure and sepsis   Reevaluation:  After the interventions noted above I re-evaluated patient and found that they have :stayed the same    Problem List / ED Course: Presented for altered mental status and unresponsiveness.  Per chart review, patient was discharged yesterday was treated for pneumonia and COVID.  On arrival, patient was on nonrebreather satting in the low 90s. EMS reported that he did have multiple runs of what appeared to be ventricular tachycardia.  10-30 beats per run per EMS.  On arrival patient did have a fever and given his known history of recent pneumonia related to his COVID along with significant altered mental status in comparison to his baseline prompted activation of code sepsis.  Patient was initially stable on nonrebreather.  O2 sats began to decline into the low 80s, added nasal cannula.  Consulted ICU who recommended high flow nasal cannula.  Sats improved to the mid 90s.  Patient also had intermittent runs of ventricular tachycardia.  Started amiodarone and repleted magnesium.  Treated active Covid infection with IV Solu-Medrol and remdesivir.  Treated fever with Tylenol. Treated for empiric infection with vancomycin and cefepime. Volume resuscitated with 1 L bolus of LR followed by maintenance LR given that blood pressure was elevated.  Discussed with son goals of care. Son stated that he did not want to resuscitate or intubate if it were clinically indicated given his age and the severity of his illness. ICU accepted him for admission.   Dispostion:  After consideration of the diagnostic results and the patients response to treatment, I feel that the patient would benefit from admission to the ICU  for ongoing management for sepsis and respiratory failure.          Final Clinical Impression(s) / ED Diagnoses Final diagnoses:  Altered mental status, unspecified altered mental status type  Hypoxia    Rx /  DC Orders ED Discharge Orders     None         Harriet Pho, PA-C 01/31/22 2346    Leanord Asal K, DO 01/31/22 2358

## 2022-01-31 NOTE — ED Notes (Signed)
EDP at bedside speaking with family about code status, Pt's oxygen drops below 88. [place] and Non-rebreather applied

## 2022-02-01 ENCOUNTER — Inpatient Hospital Stay (HOSPITAL_COMMUNITY): Payer: Medicare Other

## 2022-02-01 DIAGNOSIS — B9562 Methicillin resistant Staphylococcus aureus infection as the cause of diseases classified elsewhere: Secondary | ICD-10-CM | POA: Diagnosis not present

## 2022-02-01 DIAGNOSIS — R7881 Bacteremia: Secondary | ICD-10-CM | POA: Diagnosis not present

## 2022-02-01 DIAGNOSIS — L8990 Pressure ulcer of unspecified site, unspecified stage: Secondary | ICD-10-CM | POA: Insufficient documentation

## 2022-02-01 DIAGNOSIS — N179 Acute kidney failure, unspecified: Secondary | ICD-10-CM | POA: Diagnosis not present

## 2022-02-01 DIAGNOSIS — G9340 Encephalopathy, unspecified: Secondary | ICD-10-CM | POA: Diagnosis not present

## 2022-02-01 LAB — BLOOD CULTURE ID PANEL (REFLEXED) - BCID2

## 2022-02-01 LAB — BASIC METABOLIC PANEL
Anion gap: 12 (ref 5–15)
BUN: 23 mg/dL (ref 8–23)
CO2: 19 mmol/L — ABNORMAL LOW (ref 22–32)
Calcium: 8 mg/dL — ABNORMAL LOW (ref 8.9–10.3)
Chloride: 103 mmol/L (ref 98–111)
Creatinine, Ser: 1.5 mg/dL — ABNORMAL HIGH (ref 0.61–1.24)
GFR, Estimated: 41 mL/min — ABNORMAL LOW (ref >60)
Glucose, Bld: 152 mg/dL — ABNORMAL HIGH (ref 70–99)
Potassium: 4 mmol/L (ref 3.5–5.1)
Sodium: 134 mmol/L — ABNORMAL LOW (ref 135–145)

## 2022-02-01 LAB — MRSA NEXT GEN BY PCR, NASAL: MRSA by PCR Next Gen: DETECTED — AB

## 2022-02-01 LAB — CBC
HCT: 35.3 % — ABNORMAL LOW (ref 39.0–52.0)
Hemoglobin: 12 g/dL — ABNORMAL LOW (ref 13.0–17.0)
MCH: 31.2 pg (ref 26.0–34.0)
MCHC: 34 g/dL (ref 30.0–36.0)
MCV: 91.7 fL (ref 80.0–100.0)
Platelets: 208 10*3/uL (ref 150–400)
RBC: 3.85 MIL/uL — ABNORMAL LOW (ref 4.22–5.81)
RDW: 14.3 % (ref 11.5–15.5)
WBC: 12.6 10*3/uL — ABNORMAL HIGH (ref 4.0–10.5)
nRBC: 0 % (ref 0.0–0.2)

## 2022-02-01 LAB — ECHOCARDIOGRAM COMPLETE
AR max vel: 1.8 cm2
AV Area VTI: 2 cm2
AV Area mean vel: 1.87 cm2
AV Mean grad: 8 mmHg
AV Peak grad: 15.4 mmHg
Ao pk vel: 1.96 m/s
Area-P 1/2: 2.69 cm2
Calc EF: 39.9 %
Height: 66 in
S' Lateral: 2.8 cm
Single Plane A2C EF: 42.7 %
Single Plane A4C EF: 40.8 %
Weight: 2313.95 oz

## 2022-02-01 LAB — URINE CULTURE: Culture: NO GROWTH

## 2022-02-01 LAB — GLUCOSE, CAPILLARY
Glucose-Capillary: 155 mg/dL — ABNORMAL HIGH (ref 70–99)
Glucose-Capillary: 150 mg/dL — ABNORMAL HIGH (ref 70–99)
Glucose-Capillary: 132 mg/dL — ABNORMAL HIGH (ref 70–99)
Glucose-Capillary: 121 mg/dL — ABNORMAL HIGH (ref 70–99)
Glucose-Capillary: 128 mg/dL — ABNORMAL HIGH (ref 70–99)
Glucose-Capillary: 144 mg/dL — ABNORMAL HIGH (ref 70–99)
Glucose-Capillary: 106 mg/dL — ABNORMAL HIGH (ref 70–99)

## 2022-02-01 LAB — PHOSPHORUS: Phosphorus: 3.5 mg/dL (ref 2.5–4.6)

## 2022-02-01 LAB — LACTIC ACID, PLASMA: Lactic Acid, Venous: 2.3 mmol/L (ref 0.5–1.9)

## 2022-02-01 LAB — MAGNESIUM: Magnesium: 2.7 mg/dL — ABNORMAL HIGH (ref 1.7–2.4)

## 2022-02-01 MED ORDER — NYSTATIN 100000 UNIT/GM EX POWD
Freq: Three times a day (TID) | CUTANEOUS | Status: DC
  Filled 2022-02-01 (×2): qty 15

## 2022-02-01 MED ORDER — VANCOMYCIN HCL 500 MG/100ML IV SOLN
500.0000 mg | INTRAVENOUS | Status: DC
  Administered 2022-02-01 – 2022-02-03 (×3): 500 mg via INTRAVENOUS
  Filled 2022-02-01 (×3): qty 100

## 2022-02-01 MED ORDER — IPRATROPIUM-ALBUTEROL 0.5-2.5 (3) MG/3ML IN SOLN: 3.0000 mL | Freq: Four times a day (QID) | RESPIRATORY_TRACT | Status: DC | PRN

## 2022-02-01 MED ORDER — ACETAMINOPHEN 325 MG PO TABS
650.0000 mg | ORAL_TABLET | Freq: Four times a day (QID) | ORAL | Status: DC | PRN
  Administered 2022-02-01 – 2022-02-06 (×3): 650 mg via ORAL
  Filled 2022-02-01 (×3): qty 2

## 2022-02-01 MED ORDER — VANCOMYCIN HCL 500 MG/100ML IV SOLN: 500.0000 mg | INTRAVENOUS | Status: DC

## 2022-02-01 MED ORDER — MUPIROCIN 2 % EX OINT
1.0000 | TOPICAL_OINTMENT | Freq: Two times a day (BID) | CUTANEOUS | Status: AC
  Administered 2022-02-01 – 2022-02-05 (×10): 1 via NASAL
  Filled 2022-02-01 (×7): qty 22

## 2022-02-01 MED ORDER — SODIUM CHLORIDE 0.9 % IV SOLN
2.0000 g | INTRAVENOUS | Status: DC
  Administered 2022-02-01: 2 g via INTRAVENOUS
  Filled 2022-02-01: qty 12.5

## 2022-02-01 MED ORDER — CHLORHEXIDINE GLUCONATE CLOTH 2 % EX PADS
6.0000 | MEDICATED_PAD | Freq: Every day | CUTANEOUS | Status: DC
  Administered 2022-02-01: 6 via TOPICAL

## 2022-02-01 MED ORDER — GUAIFENESIN 100 MG/5ML PO LIQD
5.0000 mL | ORAL | Status: DC | PRN
  Administered 2022-02-01 – 2022-02-05 (×3): 5 mL via ORAL
  Filled 2022-02-01 (×4): qty 5

## 2022-02-01 NOTE — Progress Notes (Signed)
Took pt off NRB and left on 15L/40% HHFNC at this time. Pt in no distress, no increased WOB, VS WNL. RT will continue to monitor for NRB needs again.

## 2022-02-01 NOTE — Progress Notes (Signed)
eLink Physician-Brief Progress Note Patient Name: Allen Wheeler DOB: 1919-10-10 MRN: [id number]   Date of Service  02/01/2022  HPI/Events of Note  Fever to 103.5 - Nursing request for Tylenol. AST elevated at 54. Will avoid Tylenol.   eICU Interventions  Plan: Ice packs or cooling blanket PRN.      Intervention Category Major Interventions: Other:  Anely Spiewak Cornelia Copa 02/01/2022, 3:04 AM

## 2022-02-01 NOTE — Progress Notes (Signed)
NAME:  Allen Wheeler, MRN:  [id number], DOB:  09/06/1919, LOS: 1 ADMISSION DATE:  01/31/2022, CONSULTATION DATE:  10/25 REFERRING MD:  EDP, CHIEF COMPLAINT:  AMS   History of Present Illness:  Allen Wheeler is a 88 y.o. M with PMH of GERD, depression/anxiety and discharged home yesterday after being hospitalized with Covid PNA.   He recently visited [place] to celebrate his 89th birthday and became ill after traveling home.  He was admitted 10/22-10/23 and treated with Molnupavir.  After discharge home, family states he initially seemed to be doing well, however slept most of the day today and was increasingly lethargic, so they returned to the ED.    On arrival he had runs of junctional rhythm and one of Vtach, no hypotension, he was minimally responsive and started on HFNC.  Labs significant for Lactic acid 4.4, procalcitonin 2.24, respiratory alkalosis, creatinine 1.88 and CXR with increasing RLL infiltrate.  Febrile up to 104F even after Tylenol.  He was given 1L LR, Vancomycin and Cefepime and started on Amiodarone gtt.      Code status discussed with patient's son, he states pt is still living independently and has been doing well up until contracting Covid and has lived a very full life.   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  However, given this is a potentially reversible infection, family would want intubation and then transition to comfort care if unable to extubate in a few days.  They would not want chest compressions or defibrillation.   Pertinent  Medical History   has a past medical history of Allergy, Anxiety, Chickenpox, Depression, Dizziness, nonspecific, GERD (gastroesophageal reflux disease), Hearing loss, Nephrolithiasis, Normal cardiac stress test (09-20-12), and Osteoarthritis.  Significant Hospital Events: Including procedures, antibiotic start and stop dates in addition to  other pertinent events   10/25 presented with sepsis and AMS, HFNC admit to PCCM  Interim History / Subjective:  Patient spontaneously opens eyes, awake and alert Following commands. Reports some mild headache but denies any shortness of breath, chest pain or palpitations.  Objective   Blood pressure (!) 137/93, pulse 68, temperature (!) 101.5 F (38.6 C), resp. rate (!) 23, height '5\' 6"'$  (1.676 m), weight 65.6 kg, SpO2 97 %.    FiO2 (%):  [100 %] 100 %   Intake/Output Summary (Last 24 hours) at 02/01/2022 0700 Last data filed at 02/01/2022 0600 Gross per 24 hour  Intake 2767.34 ml  Output 500 ml  Net 2267.34 ml   Filed Weights   01/31/22 2200 01/31/22 2240 02/01/22 0435  Weight: 72 kg 65.6 kg 65.6 kg   Examination: General: Ill-appearing elderly man laying in bed.  NAD. HENT: [place]/AT.  Dry mucous membrane Lungs: On HFNC.  Anterior lung sounds clear to auscultation bilaterally. Cardiovascular: Tacky brady. Irregular rhythm. Abdomen: Soft. NT/ND.  Normal Bowel sounds Extremities: Warm and dry Neuro: Alert and awake.  Follows commands.  Moves all extremities. GU: Foley in place  Creatinine 1.50, K+ 4.0, mag 2.7, Phos 3.5 Lactic acid 2.3, WBCs 12.6, Hgb 12.0  Resolved Hospital Problem list    Assessment & Plan:  Severe sepsis MRSA bacteremia Postviral pneumonia Acute metabolic encephalopathy Presented altered mental status concerning for possible viral pneumonia.  Blood cultures now positive for MRSA.  Mental status improving and oxygen needs also improving.  Endocarditis possible based on [facility]'s criteria.  We will follow-up  TTE. -Continue Vanco and cefepime -Continue IV fluids, trend lactate -Continue dexamethasone -TTE  Acute hypoxic respiratory failure O2 requirement decreasing.  Remains on heated high flow but down FiO2 100, 25 L [place] to FiO2 40% and 15 L [place]. -Continue heated high flow -Wean as able to maintain O2 sat > 92%  AKI on CKD IIIb Likely prerenal in the  setting of sepsis. Creatinine down to 1.5 from 1.88 -Continue IV fluid -Trend BMP/UOP -Nephrotoxic agents   Vtach Junctional bradycardia Episodes of V. tach on admission with rates in the 140s.  Placed on amiodarone overnight.  Heart rate variable from the 40s to the 70s and occasionally in the 100s.  No electrolyte abnormalities. -Discontinue amiodarone -Trend and replete electrolytes -Telemetry -Follow-up TTE   Hypothyroidism TSH 1.26 on 10/22 -Continue Synthroid 50 mcg daily  Best Practice (right click and "Reselect all SmartList Selections" daily)   Diet/type: NPO DVT prophylaxis: prophylactic heparin  GI prophylaxis: N/A Lines: N/A Foley:  Yes, and it is still needed Code Status:  limited, only intubation or BIPAP Last date of multidisciplinary goals of care discussion [updated family at bedside]  Labs   CBC: Recent Labs  Lab 01/28/22 0804 01/28/22 0848 01/29/22 0322 01/30/22 0444 01/31/22 1822 01/31/22 1857 02/01/22 0525  WBC 6.1  --  5.1 6.6 9.8  --  12.6*  NEUTROABS  --   --  4.3 5.2 7.5  --   --   HGB 10.8*   < > 11.4* 11.5* 12.5* 11.9* 12.0*  HCT 31.2*   < > 34.2* 33.4* 37.4* 35.0* 35.3*  MCV 92.0  --  91.4 90.0 94.7  --  91.7  PLT 158  --  199 231 194  --  208   < > = values in this interval not displayed.    Basic Metabolic Panel: Recent Labs  Lab 01/28/22 0848 01/28/22 0908 01/29/22 0322 01/30/22 0444 01/31/22 1822 01/31/22 1857 01/31/22 2013 02/01/22 0525  NA 135   < > 135 137 137 135  --  134*  K 3.8   < > 4.9 4.3 4.3 4.0  --  4.0  CL 106  --  108 108 103  --   --  103  CO2  --   --  19* 20* 21*  --   --  19*  GLUCOSE 99  --  141* 136* 144*  --   --  152*  BUN 41*  --  27* 22 25*  --   --  23  CREATININE 1.80*  --  1.31* 1.24 1.88*  --   --  1.50*  CALCIUM  --   --  8.3* 8.7* 8.7*  --   --  8.0*  MG  --   --   --   --   --   --  2.0 2.7*  PHOS  --   --   --   --   --   --   --  3.5   < > = values in this interval not displayed.    GFR: Estimated Creatinine Clearance: 23.6 mL/min (A) (by C-G formula based on SCr of 1.5 mg/dL (H)). Recent Labs  Lab 01/28/22 0804 01/29/22 0322 01/29/22 1210 01/30/22 0444 01/31/22 1822 01/31/22 2013 01/31/22 2015 02/01/22 0525  PROCALCITON  --   --  0.10  --   --  2.24  --   --   WBC 6.1 5.1  --  6.6 9.8  --   --  12.6*  LATICACIDVEN 1.2  --   --   --  4.4*  --  3.7*  --     Liver Function Tests: Recent Labs  Lab 01/29/22 0322 01/30/22 0444 01/31/22 1822  AST 22 24 54*  ALT '18 18 29  '$ ALKPHOS 54 51 73  BILITOT 1.1 0.9 1.5*  PROT 6.5 6.4* 6.7  ALBUMIN 2.5* 2.6* 2.7*   No results for input(s): "LIPASE", "AMYLASE" in the last 168 hours. No results for input(s): "AMMONIA" in the last 168 hours.  ABG    Component Value Date/Time   PHART 7.454 (H) 01/31/2022 1857   PCO2ART 21.8 (L) 01/31/2022 1857   PO2ART 71 (L) 01/31/2022 1857   HCO3 15.0 (L) 01/31/2022 1857   TCO2 16 (L) 01/31/2022 1857   ACIDBASEDEF 6.0 (H) 01/31/2022 1857   O2SAT 94 01/31/2022 1857     Coagulation Profile: Recent Labs  Lab 01/31/22 1822  INR 1.3*    Cardiac Enzymes: No results for input(s): "CKTOTAL", "CKMB", "CKMBINDEX", "TROPONINI" in the last 168 hours.  HbA1C: Hgb A1c MFr Bld  Date/Time Value Ref Range Status  01/31/2022 06:22 PM 5.9 (H) 4.8 - 5.6 % Final    Comment:    (NOTE) Pre diabetes:          5.7%-6.4%  Diabetes:              >6.4%  Glycemic control for   <7.0% adults with diabetes     CBG: Recent Labs  Lab 02/01/22 0401  GLUCAP 150*    Review of Systems:   As above  Past Medical History:  He,  has a past medical history of Allergy, Anxiety, Chickenpox, Depression, Dizziness, nonspecific, GERD (gastroesophageal reflux disease), Hearing loss, Nephrolithiasis, Normal cardiac stress test (09-20-12), and Osteoarthritis.   Surgical History:   Past Surgical History:  Procedure Laterality Date   CATARACT EXTRACTION  2017   CHOLECYSTECTOMY N/A 02/18/2016    Procedure: LAPAROSCOPIC CHOLECYSTECTOMY WITH POSSIBLE INTRAOPERATIVE CHOLANGIOGRAM;  Surgeon: Mickeal Skinner, MD;  Location: [place];  Service: General;  Laterality: N/A;   ERCP N/A 02/16/2016   Procedure: ENDOSCOPIC RETROGRADE CHOLANGIOPANCREATOGRAPHY (ERCP);  Surgeon: Teena Irani, MD;  Location: [facility];  Service: Endoscopy;  Laterality: N/A;   HERNIA REPAIR     wears truss   INGUINAL HERNIA REPAIR Right 06/21/2016   Procedure: OPEN RIGHT INGUINAL HERNIA REPAIR;  Surgeon: [name] Kinsinger, MD;  Location: [facility];  Service: General;  Laterality: Right;   INSERTION OF MESH Right 06/21/2016   Procedure: INSERTION OF MESH;  Surgeon: [name] Kinsinger, MD;  Location: [facility];  Service: General;  Laterality: Right;   MASTOIDECTOMY       Social History:   reports that he has quit smoking. He has never used smokeless tobacco. He reports current alcohol use of about 18.0 standard drinks of alcohol per week. He reports that he does not use drugs.   Family History:  His family history includes Colon cancer in his father; Heart disease in his mother.   Allergies No Known Allergies   Home Medications  Prior to Admission medications   Medication Sig Start Date End Date Taking? Authorizing Provider  citalopram (CELEXA) 10 MG tablet TAKE ONE TABLET ONCE DAILY Patient taking differently: Take 10 mg by mouth in the morning. 09/22/21   [name], MD  diphenoxylate-atropine (LOMOTIL) 2.5-0.025 MG tablet TAKE ONE OR TWO TABLETS BY MOUTH EVERY MORNING Patient taking differently: Take 1-2 tablets by mouth daily as needed for diarrhea or  loose stools. 10/27/21   [name], MD  gabapentin (NEURONTIN) 100 MG capsule Take 2 capsules (200 mg total) by mouth at bedtime. 08/22/21   [name], MD  hydrOXYzine (VISTARIL) 25 MG capsule TAKE ONE CAPSULE BY MOUTH EVERY 6 HOURS AS NEEDED FOR ITCHING Patient taking differently: Take 25 mg by mouth every 6 (six) hours as needed for itching.  09/27/21   [name], MD  levothyroxine (SYNTHROID) 50 MCG tablet TAKE 1 TABLET ONCE DAILY. Patient taking differently: Take 50 mcg by mouth daily before breakfast. 01/10/21   [name], MD  LORazepam (ATIVAN) 1 MG tablet Take 1 tablet (1 mg total) by mouth at bedtime as needed. Patient taking differently: Take 1 mg by mouth at bedtime as needed for anxiety or sleep. 09/26/21   [name], MD  molnupiravir EUA (LAGEVRIO) 200 mg CAPS capsule Take 4 capsules (800 mg total) by mouth 2 (two) times daily for 3 days. 01/30/22 02/02/22  Shawna Clamp, MD  Multiple Vitamins-Minerals (CENTRUM SILVER ADULT 50+) TABS Take 1 tablet by mouth in the morning.    [provider]  nystatin (MYCOSTATIN/NYSTOP) powder Apply topically 3 (three) times daily. 01/30/22   Shawna Clamp, MD  omeprazole (PRILOSEC) 40 MG capsule TAKE 1 CAPSULE DAILY. Patient taking differently: Take 40 mg by mouth in the morning. 01/10/21   [name], MD  predniSONE (DELTASONE) 10 MG tablet TAKE 1 TABLET ONCE DAILY. Patient taking differently: Take 10 mg by mouth in the morning. 04/12/21   [name], MD  tamsulosin (FLOMAX) 0.4 MG CAPS capsule TAKE TWO CAPSULES BY MOUTH DAILY Patient taking differently: Take 0.8 mg by mouth at bedtime. 03/07/21   [name], MD     Critical care time: 46

## 2022-02-01 NOTE — Progress Notes (Signed)
PHARMACY NOTE:  ANTIMICROBIAL RENAL DOSAGE ADJUSTMENT  Current antimicrobial regimen includes a mismatch between antimicrobial dosage and estimated renal function.  As per policy approved by the Pharmacy & Therapeutics and Medical Executive Committees, the antimicrobial dosage will be adjusted accordingly.  Current antimicrobial dosage:  Cefepime and Vancomycin  Indication: sepsis  Renal Function:  Estimated Creatinine Clearance: 23.6 mL/min (A) (by C-G formula based on SCr of 1.5 mg/dL (H)). '[]'$      On intermittent HD, scheduled: '[]'$      On CRRT    Antimicrobial dosage has been changed to:   Vancomycin '500mg'$  q 24 (eAUC 440), IBW, Vd 0.72kg, based on change in renal function (Scr 1.88-->1.5)  Additional comments:   Thank you for allowing pharmacy to be a part of this patient's care.  Sandford Craze, PharmD. [facility] Acute Care PGY-1  02/01/2022 8:30 AM

## 2022-02-01 NOTE — Consult Note (Signed)
[facility]    Date of Admission:  01/31/2022     Total days of antibiotics 2               Reason for Consult: MRSA Bacteremia  Referring Provider: Tivis Ringer / [employer] Primary Care Provider: [name], MD   ASSESSMENT:  Allen Wheeler is a 88 y/o male with recently diagnosed Covid pneumonia treated with Molnupiravir that is now admitted with lethargy and altered mental status and found to have MRSA bacteremia and suspected post-viral pneumonia. Previous nasal swabs positive for MRSA. Source of infection likely pneumonia although there was a small area on his left forearm where a previous IV may have been that cannot be ruled out as there is some induration but no tenderness or redness. Will continue with vancomycin and cefepime for now. For scrotal erythema continue with nystatin powder and keep area dry. Would not treat beyond 5 days with Cefepime. Therapeutic drug monitoring of renal function and vancomycin levels. Repeat blood cultures. TTE ordered and pending. Remaining medical and supportive care per primary team.   PLAN:  Continue vancomycin and Cefepime.  Repeat blood cultures. TTE pending.  Therapeutic drug monitoring of renal function and vancomycin levels. Continue nystatin powder for suspected groin candidiasis  Remaining medical and supportive care per primary team.    Principal Problem:   Respiratory failure (HCC) Active Problems:   MRSA bacteremia    Chlorhexidine Gluconate Cloth  6 each Topical Daily   Chlorhexidine Gluconate Cloth  6 each Topical Q0600   dexamethasone (DECADRON) injection  6 mg Intravenous Q24H   heparin  5,000 Units Subcutaneous Q8H   insulin aspart  0-9 Units Subcutaneous Q4H   levothyroxine  50 mcg Oral QAC breakfast   mupirocin ointment  1 Application Nasal BID     HPI: Allen Wheeler is a 88 y.o. male with previous medical history of GERD, anxiety and depression and recent hospitalization with  Covid pneumonia presenting with increasing lethargy.  Allen Wheeler was recently admitted from 10/22-10/23 with Covid pneumonia after travel to [place] to celebrate his 89th birthdayand was treated with Molnupiravir.  Also noted to have severe groin candidiasis that was treated with fluconazole and Nystatin powder. Discharged home on 01/30/22 to complete Molnupiravir.   On 10/25 had increased lethargy and responsiveness at home and brought to the ED. Febrile on arrival with Code Sepsis activated. Was started on broad spectrum antibiotics with vancomycin and cefepime. Lactic acid was 4.4. Chest x-ray with increasing infiltrates in the lung bases. Max temperature of 104 F.  Blood cultures obtained were positive for MRSA bacteremia.   Allen Wheeler is feeling better a little better today. No new areas of pain, recent procedures or trauma/injury.    Review of Systems: Review of Systems  Constitutional:  Positive for chills and fever. Negative for weight loss.  Respiratory:  Negative for cough, shortness of breath and wheezing.   Cardiovascular:  Negative for chest pain and leg swelling.  Gastrointestinal:  Negative for abdominal pain, constipation, diarrhea, nausea and vomiting.  Skin:  Negative for rash.     Past Medical History:  Diagnosis Date   Allergy    Anxiety    Chickenpox    Depression    Dizziness, nonspecific    loss of balance last few months   GERD (gastroesophageal reflux disease)    Hearing loss    wears hearing aids   Nephrolithiasis    Normal cardiac stress test  09-20-12   Osteoarthritis     Social History   Tobacco Use   Smoking status: Former   Smokeless tobacco: Never  Substance Use Topics   Alcohol use: Yes    Alcohol/week: 18.0 standard drinks of alcohol    Types: 14 Shots of liquor, 4 Glasses of wine per week   Drug use: No    Family History  Problem Relation Age of Onset   Heart disease Mother        Pacemaker   Colon cancer Father     No Known  Allergies  OBJECTIVE: Blood pressure (!) 147/85, pulse (!) 45, temperature (!) 100.9 F (38.3 C), resp. rate 15, height '5\' 6"'$  (1.676 m), weight 65.6 kg, SpO2 92 %.  Physical Exam Constitutional:      General: He is not in acute distress.    Appearance: He is well-developed. He is ill-appearing.     Interventions: Nasal cannula in place.  Cardiovascular:     Rate and Rhythm: Normal rate and regular rhythm.     Heart sounds: Normal heart sounds.  Pulmonary:     Effort: Pulmonary effort is normal.     Breath sounds: Normal breath sounds.  Skin:    General: Skin is warm and dry.  Neurological:     Mental Status: He is alert and oriented to person, place, and time.  Psychiatric:        Behavior: Behavior normal. Behavior is cooperative.        Thought Content: Thought content normal.        Judgment: Judgment normal.     Lab Results Lab Results  Component Value Date   WBC 12.6 (H) 02/01/2022   HGB 12.0 (L) 02/01/2022   HCT 35.3 (L) 02/01/2022   MCV 91.7 02/01/2022   PLT 208 02/01/2022    Lab Results  Component Value Date   CREATININE 1.50 (H) 02/01/2022   BUN 23 02/01/2022   NA 134 (L) 02/01/2022   K 4.0 02/01/2022   CL 103 02/01/2022   CO2 19 (L) 02/01/2022    Lab Results  Component Value Date   ALT 29 01/31/2022   AST 54 (H) 01/31/2022   ALKPHOS 73 01/31/2022   BILITOT 1.5 (H) 01/31/2022     Microbiology: Recent Results (from the past 240 hour(s))  SARS Coronavirus 2 by RT PCR (hospital order, performed in [place] hospital lab) *cepheid single result test*     Status: Abnormal   Collection Time: 01/28/22  8:03 AM   Specimen: Nasal Swab  Result Value Ref Range Status   SARS Coronavirus 2 by RT PCR POSITIVE (A) NEGATIVE Final    Comment: (NOTE) SARS-CoV-2 target nucleic acids are DETECTED  SARS-CoV-2 RNA is generally detectable in upper respiratory specimens  during the acute phase of infection.  Positive results are indicative  of the presence of the  identified virus, but do not rule out bacterial infection or co-infection with other pathogens not detected by the test.  Clinical correlation with patient history and  other diagnostic information is necessary to determine patient infection status.  The expected result is negative.  Fact Sheet for Patients:   https://www.patel.info/   Fact Sheet for Healthcare Providers:   https://hall.com/    This test is not yet approved or cleared by the [place] FDA and  has been authorized for detection and/or diagnosis of SARS-CoV-2 by FDA under an Emergency Use Authorization (EUA).  This EUA will remain in effect (meaning this test can be  used) for the duration of  the COVID-19 declaration under Section 564(b)(1)  of the Act, 21 U.S.C. section 360-bbb-3(b)(1), unless the authorization is terminated or revoked sooner.   Performed at [facility], [place] [address] [place], [place] [address]   Blood culture (routine x 2)     Status: None (Preliminary result)   Collection Time: 01/28/22  8:04 AM   Specimen: BLOOD  Result Value Ref Range Status   Specimen Description BLOOD LEFT ANTECUBITAL  Final   Special Requests   Final    BOTTLES DRAWN AEROBIC AND ANAEROBIC Blood Culture adequate volume   Culture   Final    NO GROWTH 4 DAYS Performed at [facility], [place] [address], [place], [place] [address]    Report Status PENDING  Incomplete  Blood culture (routine x 2)     Status: None (Preliminary result)   Collection Time: 01/28/22  9:06 AM   Specimen: BLOOD RIGHT HAND  Result Value Ref Range Status   Specimen Description BLOOD RIGHT HAND  Final   Special Requests   Final    BOTTLES DRAWN AEROBIC AND ANAEROBIC Blood Culture results may not be optimal due to an inadequate volume of blood received in culture bottles   Culture   Final    NO GROWTH 4 DAYS Performed at [facility], [place] [address], [place], [place] [address]     Report Status PENDING  Incomplete  Blood Culture (routine x 2)     Status: None (Preliminary result)   Collection Time: 01/31/22  6:20 PM   Specimen: BLOOD  Result Value Ref Range Status   Specimen Description BLOOD SITE NOT SPECIFIED  Final   Special Requests   Final    BOTTLES DRAWN AEROBIC AND ANAEROBIC Blood Culture adequate volume   Culture   Final    NO GROWTH < 24 HOURS Performed at [facility], [place] [address], [place], [place] [address]    Report Status PENDING  Incomplete  Blood Culture (routine x 2)     Status: None (Preliminary result)   Collection Time: 01/31/22  6:22 PM   Specimen: BLOOD  Result Value Ref Range Status   Specimen Description BLOOD SITE NOT SPECIFIED  Final   Special Requests   Final    BOTTLES DRAWN AEROBIC AND ANAEROBIC Blood Culture adequate volume   Culture  Setup Time   Final    GRAM POSITIVE COCCI IN CLUSTERS IN BOTH AEROBIC AND ANAEROBIC BOTTLES Organism ID to follow CRITICAL RESULT CALLED TO, READ BACK BY AND VERIFIED WITH: Katina Dung [id number] AT 0842 BY EC Performed at [facility], [place] [address] [place],  [address]    Culture GRAM POSITIVE COCCI  Final   Report Status PENDING  Incomplete  Blood Culture ID Panel (Reflexed)     Status: Abnormal   Collection Time: 01/31/22  6:22 PM  Result Value Ref Range Status   Enterococcus faecalis NOT DETECTED NOT DETECTED Final   Enterococcus Faecium NOT DETECTED NOT DETECTED Final   Listeria monocytogenes NOT DETECTED NOT DETECTED Final   Staphylococcus species DETECTED (A) NOT DETECTED Final    Comment: CRITICAL RESULT CALLED TO, READ BACK BY AND VERIFIED WITH: PHARMD EMILY STEINBOCK [id number] AT 0842 BY EC    Staphylococcus aureus (BCID) DETECTED (A) NOT DETECTED Final    Comment: Methicillin (oxacillin)-resistant Staphylococcus aureus (MRSA). MRSA is predictably resistant to beta-lactam antibiotics (except ceftaroline). Preferred therapy is vancomycin unless  clinically  contraindicated. Patient requires contact precautions if  hospitalized. CRITICAL RESULT CALLED TO, READ BACK BY AND VERIFIED WITH: PHARMD EMILY STEINBOCK [id number] AT 0842 BY EC    Staphylococcus epidermidis NOT DETECTED NOT DETECTED Final   Staphylococcus lugdunensis NOT DETECTED NOT DETECTED Final   Streptococcus species NOT DETECTED NOT DETECTED Final   Streptococcus agalactiae NOT DETECTED NOT DETECTED Final   Streptococcus pneumoniae NOT DETECTED NOT DETECTED Final   Streptococcus pyogenes NOT DETECTED NOT DETECTED Final   A.calcoaceticus-baumannii NOT DETECTED NOT DETECTED Final   Bacteroides fragilis NOT DETECTED NOT DETECTED Final   Enterobacterales NOT DETECTED NOT DETECTED Final   Enterobacter cloacae complex NOT DETECTED NOT DETECTED Final   Escherichia coli NOT DETECTED NOT DETECTED Final   Klebsiella aerogenes NOT DETECTED NOT DETECTED Final   Klebsiella oxytoca NOT DETECTED NOT DETECTED Final   Klebsiella pneumoniae NOT DETECTED NOT DETECTED Final   Proteus species NOT DETECTED NOT DETECTED Final   Salmonella species NOT DETECTED NOT DETECTED Final   Serratia marcescens NOT DETECTED NOT DETECTED Final   Haemophilus influenzae NOT DETECTED NOT DETECTED Final   Neisseria meningitidis NOT DETECTED NOT DETECTED Final   Pseudomonas aeruginosa NOT DETECTED NOT DETECTED Final   Stenotrophomonas maltophilia NOT DETECTED NOT DETECTED Final   Candida albicans NOT DETECTED NOT DETECTED Final   Candida auris NOT DETECTED NOT DETECTED Final   Candida glabrata NOT DETECTED NOT DETECTED Final   Candida krusei NOT DETECTED NOT DETECTED Final   Candida parapsilosis NOT DETECTED NOT DETECTED Final   Candida tropicalis NOT DETECTED NOT DETECTED Final   Cryptococcus neoformans/gattii NOT DETECTED NOT DETECTED Final   Meth resistant mecA/C and MREJ DETECTED (A) NOT DETECTED Final    Comment: CRITICAL RESULT CALLED TO, READ BACK BY AND VERIFIED WITH: Louanne Belton [id number] AT  0842 BY EC Performed at [facility], [address], [place], [place] [address]   Resp Panel by RT-PCR (Flu A&B, Covid) Anterior Nasal Swab     Status: Abnormal   Collection Time: 01/31/22  6:32 PM   Specimen: Anterior Nasal Swab  Result Value Ref Range Status   SARS Coronavirus 2 by RT PCR POSITIVE (A) NEGATIVE Final    Comment: (NOTE) SARS-CoV-2 target nucleic acids are DETECTED.  The SARS-CoV-2 RNA is generally detectable in upper respiratory specimens during the acute phase of infection. Positive results are indicative of the presence of the identified virus, but do not rule out bacterial infection or co-infection with other pathogens not detected by the test. Clinical correlation with patient history and other diagnostic information is necessary to determine patient infection status. The expected result is Negative.  Fact Sheet for Patients: EntrepreneurPulse.com.au  Fact Sheet for Healthcare Providers: IncredibleEmployment.be  This test is not yet approved or cleared by the [place] FDA and  has been authorized for detection and/or diagnosis of SARS-CoV-2 by FDA under an Emergency Use Authorization (EUA).  This EUA will remain in effect (meaning this test can be used) for the duration of  the COVID-19 declaration under Section 564(b)(1) of the A ct, 21 U.S.C. section 360bbb-3(b)(1), unless the authorization is terminated or revoked sooner.     Influenza A by PCR NEGATIVE NEGATIVE Final   Influenza B by PCR NEGATIVE NEGATIVE Final    Comment: (NOTE) The Xpert Xpress SARS-CoV-2/FLU/RSV plus assay is intended as an aid in the diagnosis of influenza from Nasopharyngeal swab specimens and should not be used as a sole basis for treatment. Nasal washings and aspirates are unacceptable  for Xpert Xpress SARS-CoV-2/FLU/RSV testing.  Fact Sheet for Patients: EntrepreneurPulse.com.au  Fact Sheet for Healthcare  Providers: IncredibleEmployment.be  This test is not yet approved or cleared by the [place] FDA and has been authorized for detection and/or diagnosis of SARS-CoV-2 by FDA under an Emergency Use Authorization (EUA). This EUA will remain in effect (meaning this test can be used) for the duration of the COVID-19 declaration under Section 564(b)(1) of the Act, 21 U.S.C. section 360bbb-3(b)(1), unless the authorization is terminated or revoked.  Performed at [facility], [place] [address] [place], [place] [address]   MRSA Next Gen by PCR, Nasal     Status: Abnormal   Collection Time: 01/31/22 11:16 PM   Specimen: Nasal Mucosa; Nasal Swab  Result Value Ref Range Status   MRSA by PCR Next Gen DETECTED (A) NOT DETECTED Final    Comment: RESULTS CALLED TO, READ BACK BY AND VERIFIED WITH RN T.JOHNSON '@0045'$  ON 02/01/2022 BY NM (NOTE) The GeneXpert MRSA Assay (FDA approved for NASAL specimens only), is one component of a comprehensive MRSA colonization surveillance program. It is not intended to diagnose MRSA infection nor to guide or monitor treatment for MRSA infections. Test performance is not FDA approved in patients less than 50 years old. Performed at [facility], [place] [address], [place], [place] [address]      Terri Piedra, [facility]  02/01/2022  12:34 PM

## 2022-02-01 NOTE — Progress Notes (Signed)
PHARMACY - PHYSICIAN COMMUNICATION CRITICAL VALUE ALERT - BLOOD CULTURE IDENTIFICATION (BCID)  Allen Wheeler is an 88 y.o. male who presented to [facility] on 01/31/2022 with a chief complaint of PNA  Assessment:  89 YOM with recent admission for COVID 10/22-10/24 readmitted 10/25 with continued lethargy/hypoxia and now found to have MRSA in 2 of 4 blood cultures - presumed pulmonary source. Alternative could be from skin breakdown or old PIV site.  Name of physician (or Provider) Contacted: Juleen China (automatic ID consult), CCM team also aware  Current antibiotics: Vancomycin + Cefepime  Changes to prescribed antibiotics recommended:  Continue Vancomycin + Cefepime initially for now with concerns for superimposed per discussion with CCM team. Low threshold to d/c Cefepime soon.  Results for orders placed or performed during the hospital encounter of 01/31/22  Blood Culture ID Panel (Reflexed) (Collected: 01/31/2022  6:22 PM)  Result Value Ref Range   Enterococcus faecalis NOT DETECTED NOT DETECTED   Enterococcus Faecium NOT DETECTED NOT DETECTED   Listeria monocytogenes NOT DETECTED NOT DETECTED   Staphylococcus species DETECTED (A) NOT DETECTED   Staphylococcus aureus (BCID) DETECTED (A) NOT DETECTED   Staphylococcus epidermidis NOT DETECTED NOT DETECTED   Staphylococcus lugdunensis NOT DETECTED NOT DETECTED   Streptococcus species NOT DETECTED NOT DETECTED   Streptococcus agalactiae NOT DETECTED NOT DETECTED   Streptococcus pneumoniae NOT DETECTED NOT DETECTED   Streptococcus pyogenes NOT DETECTED NOT DETECTED   A.calcoaceticus-baumannii NOT DETECTED NOT DETECTED   Bacteroides fragilis NOT DETECTED NOT DETECTED   Enterobacterales NOT DETECTED NOT DETECTED   Enterobacter cloacae complex NOT DETECTED NOT DETECTED   Escherichia coli NOT DETECTED NOT DETECTED   Klebsiella aerogenes NOT DETECTED NOT DETECTED   Klebsiella oxytoca NOT DETECTED NOT DETECTED   Klebsiella pneumoniae  NOT DETECTED NOT DETECTED   Proteus species NOT DETECTED NOT DETECTED   Salmonella species NOT DETECTED NOT DETECTED   Serratia marcescens NOT DETECTED NOT DETECTED   Haemophilus influenzae NOT DETECTED NOT DETECTED   Neisseria meningitidis NOT DETECTED NOT DETECTED   Pseudomonas aeruginosa NOT DETECTED NOT DETECTED   Stenotrophomonas maltophilia NOT DETECTED NOT DETECTED   Candida albicans NOT DETECTED NOT DETECTED   Candida auris NOT DETECTED NOT DETECTED   Candida glabrata NOT DETECTED NOT DETECTED   Candida krusei NOT DETECTED NOT DETECTED   Candida parapsilosis NOT DETECTED NOT DETECTED   Candida tropicalis NOT DETECTED NOT DETECTED   Cryptococcus neoformans/gattii NOT DETECTED NOT DETECTED   Meth resistant mecA/C and MREJ DETECTED (A) NOT DETECTED    Thank you for allowing pharmacy to be a part of this patient's care.  Alycia Rossetti, PharmD, BCPS Infectious Diseases Clinical Pharmacist 02/01/2022 2:23 PM   **Pharmacist phone directory can now be found on [email or web address] (PW [email or web address]).  Listed under [place].

## 2022-02-02 DIAGNOSIS — J9601 Acute respiratory failure with hypoxia: Secondary | ICD-10-CM

## 2022-02-02 DIAGNOSIS — R0902 Hypoxemia: Secondary | ICD-10-CM | POA: Diagnosis not present

## 2022-02-02 DIAGNOSIS — R7881 Bacteremia: Secondary | ICD-10-CM | POA: Diagnosis not present

## 2022-02-02 DIAGNOSIS — J1282 Pneumonia due to coronavirus disease 2019: Secondary | ICD-10-CM

## 2022-02-02 DIAGNOSIS — U071 COVID-19: Secondary | ICD-10-CM | POA: Diagnosis not present

## 2022-02-02 DIAGNOSIS — B9562 Methicillin resistant Staphylococcus aureus infection as the cause of diseases classified elsewhere: Secondary | ICD-10-CM | POA: Diagnosis not present

## 2022-02-02 LAB — CULTURE, BLOOD (ROUTINE X 2)
Culture: NO GROWTH
Culture: NO GROWTH
Special Requests: ADEQUATE

## 2022-02-02 LAB — CBC
HCT: 34.1 % — ABNORMAL LOW (ref 39.0–52.0)
Hemoglobin: 11.6 g/dL — ABNORMAL LOW (ref 13.0–17.0)
MCH: 31.3 pg (ref 26.0–34.0)
MCHC: 34 g/dL (ref 30.0–36.0)
MCV: 91.9 fL (ref 80.0–100.0)
Platelets: 192 10*3/uL (ref 150–400)
RBC: 3.71 MIL/uL — ABNORMAL LOW (ref 4.22–5.81)
RDW: 14.1 % (ref 11.5–15.5)
WBC: 17 10*3/uL — ABNORMAL HIGH (ref 4.0–10.5)
nRBC: 0 % (ref 0.0–0.2)

## 2022-02-02 LAB — BASIC METABOLIC PANEL
Anion gap: 8 (ref 5–15)
BUN: 28 mg/dL — ABNORMAL HIGH (ref 8–23)
CO2: 21 mmol/L — ABNORMAL LOW (ref 22–32)
Calcium: 7.9 mg/dL — ABNORMAL LOW (ref 8.9–10.3)
Chloride: 105 mmol/L (ref 98–111)
Creatinine, Ser: 1.48 mg/dL — ABNORMAL HIGH (ref 0.61–1.24)
GFR, Estimated: 42 mL/min — ABNORMAL LOW (ref >60)
Glucose, Bld: 125 mg/dL — ABNORMAL HIGH (ref 70–99)
Potassium: 3.8 mmol/L (ref 3.5–5.1)
Sodium: 134 mmol/L — ABNORMAL LOW (ref 135–145)

## 2022-02-02 LAB — GLUCOSE, CAPILLARY
Glucose-Capillary: 121 mg/dL — ABNORMAL HIGH (ref 70–99)
Glucose-Capillary: 101 mg/dL — ABNORMAL HIGH (ref 70–99)
Glucose-Capillary: 98 mg/dL (ref 70–99)
Glucose-Capillary: 148 mg/dL — ABNORMAL HIGH (ref 70–99)
Glucose-Capillary: 95 mg/dL (ref 70–99)

## 2022-02-02 LAB — MAGNESIUM: Magnesium: 2.4 mg/dL (ref 1.7–2.4)

## 2022-02-02 LAB — LACTIC ACID, PLASMA: Lactic Acid, Venous: 1.8 mmol/L (ref 0.5–1.9)

## 2022-02-02 MED ORDER — TAMSULOSIN HCL 0.4 MG PO CAPS
0.8000 mg | ORAL_CAPSULE | Freq: Every day | ORAL | Status: DC
  Administered 2022-02-02 – 2022-02-15 (×14): 0.8 mg via ORAL
  Filled 2022-02-02 (×14): qty 2

## 2022-02-02 MED ORDER — CITALOPRAM HYDROBROMIDE 20 MG PO TABS
10.0000 mg | ORAL_TABLET | Freq: Every morning | ORAL | Status: DC
  Administered 2022-02-02 – 2022-02-16 (×15): 10 mg via ORAL
  Filled 2022-02-02 (×15): qty 1

## 2022-02-02 MED ORDER — LORAZEPAM 1 MG PO TABS
1.0000 mg | ORAL_TABLET | Freq: Every evening | ORAL | Status: DC | PRN
  Administered 2022-02-02: 1 mg via ORAL
  Filled 2022-02-02: qty 1

## 2022-02-02 MED ORDER — PANTOPRAZOLE SODIUM 40 MG PO TBEC
40.0000 mg | DELAYED_RELEASE_TABLET | Freq: Every day | ORAL | Status: DC
  Administered 2022-02-02 – 2022-02-16 (×15): 40 mg via ORAL
  Filled 2022-02-02 (×15): qty 1

## 2022-02-02 NOTE — Progress Notes (Signed)
[facility] for Infectious Disease  Date of Admission:  01/31/2022     Total days of antibiotics 6         ASSESSMENT:  Allen Wheeler fever curve is down trending.  Blood cultures drawn today and pending. Slight increase in oxygen need but overall stable. TTE without evidence of vegetation. Renal function appears stable with current dose of vancomycin. Continue current dose of vancomycin and monitor cultures for clearance of bacteremia. Cefepime stopped. Respiratory and remaining medical care per Critical Care.   PLAN:  Continue vancomycin.  Continue therapeutic monitoring of renal function and vancomycin levels.  Monitor cultures for clearance of bacteremia.  Respiratory and remaining medical care per Critical Care.   Dr. Linus Salmons will be available over the weekend for ID related questions/concerns. Dr. Juleen China to follow up on Monday.   Principal Problem:   Respiratory failure (HCC) Active Problems:   MRSA bacteremia   Pressure injury of skin    Chlorhexidine Gluconate Cloth  6 each Topical Daily   Chlorhexidine Gluconate Cloth  6 each Topical Q0600   dexamethasone (DECADRON) injection  6 mg Intravenous Q24H   heparin  5,000 Units Subcutaneous Q8H   insulin aspart  0-9 Units Subcutaneous Q4H   levothyroxine  50 mcg Oral QAC breakfast   mupirocin ointment  1 Application Nasal BID   nystatin   Topical TID    SUBJECTIVE:  Fever curve down trending with increased leukocytosis. No acute events overnight. Felling no worse today.   No Known Allergies   Review of Systems: Review of Systems  Constitutional:  Negative for chills, fever and weight loss.  Respiratory:  Negative for cough, shortness of breath and wheezing.   Cardiovascular:  Negative for chest pain and leg swelling.  Gastrointestinal:  Negative for abdominal pain, constipation, diarrhea, nausea and vomiting.  Skin:  Negative for rash.      OBJECTIVE: Vitals:   02/02/22 0900 02/02/22 1000 02/02/22  1100 02/02/22 1200  BP: (!) 146/89 (!) 54/32  111/73  Pulse: (!) 55 62 82 62  Resp: 18 (!) 22 18 (!) 21  Temp: 99.1 F (37.3 C) 99.3 F (37.4 C) 99.5 F (37.5 C) 98.8 F (37.1 C)  TempSrc:      SpO2: 93%  92% 90%  Weight:      Height:       Body mass index is 23.34 kg/m.  Physical Exam Constitutional:      General: He is not in acute distress.    Appearance: He is well-developed.     Comments: Lying in bed with head of bed elevated; pleasant; hard of hearing.   Cardiovascular:     Rate and Rhythm: Normal rate and regular rhythm.     Heart sounds: Normal heart sounds.  Pulmonary:     Effort: Pulmonary effort is normal.     Breath sounds: Normal breath sounds.  Skin:    General: Skin is warm and dry.  Neurological:     Mental Status: He is alert and oriented to person, place, and time.  Psychiatric:        Mood and Affect: Mood normal.     Lab Results Lab Results  Component Value Date   WBC 17.0 (H) 02/02/2022   HGB 11.6 (L) 02/02/2022   HCT 34.1 (L) 02/02/2022   MCV 91.9 02/02/2022   PLT 192 02/02/2022    Lab Results  Component Value Date   CREATININE 1.48 (H) 02/02/2022   BUN 28 (H) 02/02/2022  NA 134 (L) 02/02/2022   K 3.8 02/02/2022   CL 105 02/02/2022   CO2 21 (L) 02/02/2022    Lab Results  Component Value Date   ALT 29 01/31/2022   AST 54 (H) 01/31/2022   ALKPHOS 73 01/31/2022   BILITOT 1.5 (H) 01/31/2022     Microbiology: Recent Results (from the past 240 hour(s))  SARS Coronavirus 2 by RT PCR (hospital order, performed in [facility] hospital lab) *cepheid single result test*     Status: Abnormal   Collection Time: 01/28/22  8:03 AM   Specimen: Nasal Swab  Result Value Ref Range Status   SARS Coronavirus 2 by RT PCR POSITIVE (A) NEGATIVE Final    Comment: (NOTE) SARS-CoV-2 target nucleic acids are DETECTED  SARS-CoV-2 RNA is generally detectable in upper respiratory specimens  during the acute phase of infection.  Positive results are  indicative  of the presence of the identified virus, but do not rule out bacterial infection or co-infection with other pathogens not detected by the test.  Clinical correlation with patient history and  other diagnostic information is necessary to determine patient infection status.  The expected result is negative.  Fact Sheet for Patients:   https://www.patel.info/   Fact Sheet for Healthcare Providers:   https://hall.com/    This test is not yet approved or cleared by the [place] FDA and  has been authorized for detection and/or diagnosis of SARS-CoV-2 by FDA under an Emergency Use Authorization (EUA).  This EUA will remain in effect (meaning this test can be used) for the duration of  the COVID-19 declaration under Section 564(b)(1)  of the Act, 21 U.S.C. section 360-bbb-3(b)(1), unless the authorization is terminated or revoked sooner.   Performed at [facility], [place] [address] [place], [place] [address]   Blood culture (routine x 2)     Status: None   Collection Time: 01/28/22  8:04 AM   Specimen: BLOOD  Result Value Ref Range Status   Specimen Description BLOOD LEFT ANTECUBITAL  Final   Special Requests   Final    BOTTLES DRAWN AEROBIC AND ANAEROBIC Blood Culture adequate volume   Culture   Final    NO GROWTH 5 DAYS Performed at [facility], [place] [address], [place], [place] [address]    Report Status 02/02/2022 FINAL  Final  Blood culture (routine x 2)     Status: None   Collection Time: 01/28/22  9:06 AM   Specimen: BLOOD RIGHT HAND  Result Value Ref Range Status   Specimen Description BLOOD RIGHT HAND  Final   Special Requests   Final    BOTTLES DRAWN AEROBIC AND ANAEROBIC Blood Culture results may not be optimal due to an inadequate volume of blood received in culture bottles   Culture   Final    NO GROWTH 5 DAYS Performed at [facility], [place] [address] [place], [place] [address]     Report Status 02/02/2022 FINAL  Final  Blood Culture (routine x 2)     Status: Abnormal (Preliminary result)   Collection Time: 01/31/22  6:20 PM   Specimen: BLOOD  Result Value Ref Range Status   Specimen Description BLOOD SITE NOT SPECIFIED  Final   Special Requests   Final    BOTTLES DRAWN AEROBIC AND ANAEROBIC Blood Culture adequate volume Performed at [facility], [place] [address], [place], [place] [address]    Culture STAPHYLOCOCCUS AUREUS (A)  Final   Report  Status PENDING  Incomplete  Blood Culture (routine x 2)     Status: Abnormal (Preliminary result)   Collection Time: 01/31/22  6:22 PM   Specimen: BLOOD  Result Value Ref Range Status   Specimen Description BLOOD SITE NOT SPECIFIED  Final   Special Requests   Final    BOTTLES DRAWN AEROBIC AND ANAEROBIC Blood Culture adequate volume   Culture  Setup Time   Final    GRAM POSITIVE COCCI IN CLUSTERS IN BOTH AEROBIC AND ANAEROBIC BOTTLES Organism ID to follow CRITICAL RESULT CALLED TO, READ BACK BY AND VERIFIED WITH: PHARMD EMILY Leslie Dales [id number] AT 0842 BY EC    Culture (A)  Final    STAPHYLOCOCCUS AUREUS SUSCEPTIBILITIES TO FOLLOW Performed at [facility], [place] [address], [place], [place] [address]    Report Status PENDING  Incomplete  Urine Culture     Status: None   Collection Time: 01/31/22  6:22 PM   Specimen: In/Out Cath Urine  Result Value Ref Range Status   Specimen Description IN/OUT CATH URINE  Final   Special Requests NONE  Final   Culture   Final    NO GROWTH Performed at [facility], [place] [address] [place], [place] [address]    Report Status 02/01/2022 FINAL  Final  Blood Culture ID Panel (Reflexed)     Status: Abnormal   Collection Time: 01/31/22  6:22 PM  Result Value Ref Range Status   Enterococcus faecalis NOT DETECTED NOT DETECTED Final   Enterococcus Faecium NOT DETECTED NOT DETECTED Final   Listeria monocytogenes NOT DETECTED NOT DETECTED Final   Staphylococcus species  DETECTED (A) NOT DETECTED Final    Comment: CRITICAL RESULT CALLED TO, READ BACK BY AND VERIFIED WITH: PHARMD EMILY STEINBOCK [id number] AT 0842 BY EC    Staphylococcus aureus (BCID) DETECTED (A) NOT DETECTED Final    Comment: Methicillin (oxacillin)-resistant Staphylococcus aureus (MRSA). MRSA is predictably resistant to beta-lactam antibiotics (except ceftaroline). Preferred therapy is vancomycin unless clinically contraindicated. Patient requires contact precautions if  hospitalized. CRITICAL RESULT CALLED TO, READ BACK BY AND VERIFIED WITH: PHARMD EMILY Leslie Dales [phone] AT 0842 BY EC    Staphylococcus epidermidis NOT DETECTED NOT DETECTED Final   Staphylococcus lugdunensis NOT DETECTED NOT DETECTED Final   Streptococcus species NOT DETECTED NOT DETECTED Final   Streptococcus agalactiae NOT DETECTED NOT DETECTED Final   Streptococcus pneumoniae NOT DETECTED NOT DETECTED Final   Streptococcus pyogenes NOT DETECTED NOT DETECTED Final   A.calcoaceticus-baumannii NOT DETECTED NOT DETECTED Final   Bacteroides fragilis NOT DETECTED NOT DETECTED Final   Enterobacterales NOT DETECTED NOT DETECTED Final   Enterobacter cloacae complex NOT DETECTED NOT DETECTED Final   Escherichia coli NOT DETECTED NOT DETECTED Final   Klebsiella aerogenes NOT DETECTED NOT DETECTED Final   Klebsiella oxytoca NOT DETECTED NOT DETECTED Final   Klebsiella pneumoniae NOT DETECTED NOT DETECTED Final   Proteus species NOT DETECTED NOT DETECTED Final   Salmonella species NOT DETECTED NOT DETECTED Final   Serratia marcescens NOT DETECTED NOT DETECTED Final   Haemophilus influenzae NOT DETECTED NOT DETECTED Final   Neisseria meningitidis NOT DETECTED NOT DETECTED Final   Pseudomonas aeruginosa NOT DETECTED NOT DETECTED Final   Stenotrophomonas maltophilia NOT DETECTED NOT DETECTED Final   Candida albicans NOT DETECTED NOT DETECTED Final   Candida auris NOT DETECTED NOT DETECTED Final   Candida glabrata NOT DETECTED  NOT DETECTED Final   Candida krusei NOT DETECTED NOT DETECTED Final   Candida parapsilosis  NOT DETECTED NOT DETECTED Final   Candida tropicalis NOT DETECTED NOT DETECTED Final   Cryptococcus neoformans/gattii NOT DETECTED NOT DETECTED Final   Meth resistant mecA/C and MREJ DETECTED (A) NOT DETECTED Final    Comment: CRITICAL RESULT CALLED TO, READ BACK BY AND VERIFIED WITH: Louanne Belton [phone] AT 0842 BY EC Performed at [facility], [place] [address], [place], [place] [address]   Resp Panel by RT-PCR (Flu A&B, Covid) Anterior Nasal Swab     Status: Abnormal   Collection Time: 01/31/22  6:32 PM   Specimen: Anterior Nasal Swab  Result Value Ref Range Status   SARS Coronavirus 2 by RT PCR POSITIVE (A) NEGATIVE Final    Comment: (NOTE) SARS-CoV-2 target nucleic acids are DETECTED.  The SARS-CoV-2 RNA is generally detectable in upper respiratory specimens during the acute phase of infection. Positive results are indicative of the presence of the identified virus, but do not rule out bacterial infection or co-infection with other pathogens not detected by the test. Clinical correlation with patient history and other diagnostic information is necessary to determine patient infection status. The expected result is Negative.  Fact Sheet for Patients: EntrepreneurPulse.com.au  Fact Sheet for Healthcare Providers: IncredibleEmployment.be  This test is not yet approved or cleared by the [place] FDA and  has been authorized for detection and/or diagnosis of SARS-CoV-2 by FDA under an Emergency Use Authorization (EUA).  This EUA will remain in effect (meaning this test can be used) for the duration of  the COVID-19 declaration under Section 564(b)(1) of the A ct, 21 U.S.C. section 360bbb-3(b)(1), unless the authorization is terminated or revoked sooner.     Influenza A by PCR NEGATIVE NEGATIVE Final   Influenza B by PCR NEGATIVE NEGATIVE  Final    Comment: (NOTE) The Xpert Xpress SARS-CoV-2/FLU/RSV plus assay is intended as an aid in the diagnosis of influenza from Nasopharyngeal swab specimens and should not be used as a sole basis for treatment. Nasal washings and aspirates are unacceptable for Xpert Xpress SARS-CoV-2/FLU/RSV testing.  Fact Sheet for Patients: EntrepreneurPulse.com.au  Fact Sheet for Healthcare Providers: IncredibleEmployment.be  This test is not yet approved or cleared by the [place] FDA and has been authorized for detection and/or diagnosis of SARS-CoV-2 by FDA under an Emergency Use Authorization (EUA). This EUA will remain in effect (meaning this test can be used) for the duration of the COVID-19 declaration under Section 564(b)(1) of the Act, 21 U.S.C. section 360bbb-3(b)(1), unless the authorization is terminated or revoked.  Performed at [facility], [place] [address], [place], [place] [address]   MRSA Next Gen by PCR, Nasal     Status: Abnormal   Collection Time: 01/31/22 11:16 PM   Specimen: Nasal Mucosa; Nasal Swab  Result Value Ref Range Status   MRSA by PCR Next Gen DETECTED (A) NOT DETECTED Final    Comment: RESULTS CALLED TO, READ BACK BY AND VERIFIED WITH RN T.JOHNSON '@0045'$  ON 02/01/2022 BY NM (NOTE) The GeneXpert MRSA Assay (FDA approved for NASAL specimens only), is one component of a comprehensive MRSA colonization surveillance program. It is not intended to diagnose MRSA infection nor to guide or monitor treatment for MRSA infections. Test performance is not FDA approved in patients less than 96 years old. Performed at [facility], [place] [address], [place], [place] [address]      Terri Piedra, [facility]  02/02/2022  1:27 PM

## 2022-02-02 NOTE — Evaluation (Signed)
Occupational Therapy Evaluation Patient Details Name: Allen Wheeler MRN: [id number] DOB: Mar 25, 1920 Today's Date: 02/02/2022   History of Present Illness Patient is 88 year old male with a recent diagnosis of COVID-pneumonia, treated, discharged home then reutrned the following day due to increased lethargy. Pt in junctional bradycardia, with intermittent runs of VT and  was hypoxic and placed on nonrebreather; Pt with acute hypoxic rspoiratory failure, sever sepsis wiht acute metabolic encephalopahthy due to PNA.   Clinical Impression   Pt recently discharged from the hospital with recommendations for [place]. Prior to his hospitalization, Allen Wheeler lived alone independently.  Pt seen on 8L HFNC and was able to sustain SpO2 above 88 on 8L with increased WOB, however able to hold a conversation throughout session. Currently min A for mobility and Mod A for ADL tasks. Hopeful that Allen Wheeler will progress to DC home with [facility] however will need 24/7 S after DC. Acute Ot to follow to facilitate safe DC home.     Recommendations for follow up therapy are one component of a multi-disciplinary discharge planning process, led by the attending physician.  Recommendations may be updated based on patient status, additional functional criteria and insurance authorization.   Follow Up Recommendations  Home health OT    Assistance Recommended at Discharge Frequent or constant Supervision/Assistance  Patient can return home with the following A little help with walking and/or transfers;A little help with bathing/dressing/bathroom;Assistance with cooking/housework;Direct supervision/assist for medications management;Direct supervision/assist for financial management;Assist for transportation;Help with stairs or ramp for entrance    Functional Status Assessment  Patient has had a recent decline in their functional status and demonstrates the ability to make significant improvements in function in a reasonable and  predictable amount of time.  Equipment Recommendations  Tub/shower seat    Recommendations for Other Services       Precautions / Restrictions Precautions Precautions: Fall Precaution Comments: Monitor O2, covid+      Mobility Bed Mobility Overal bed mobility: Needs Assistance Bed Mobility: Supine to Sit     Supine to sit: Min assist          Transfers Overall transfer level: Needs assistance Equipment used: 2 person hand held assist Transfers: Sit to/from Stand, Bed to chair/wheelchair/BSC Sit to Stand: Min assist     Step pivot transfers: Min assist            Balance Overall balance assessment: Needs assistance Sitting-balance support: Feet supported Sitting balance-Leahy Scale: Good       Standing balance-Leahy Scale: Normal                             ADL either performed or assessed with clinical judgement   ADL   Eating/Feeding: Set up       Upper Body Bathing: Set up;Supervision/ safety       Upper Body Dressing : Minimal assistance   Lower Body Dressing: Moderate assistance   Toilet Transfer: Minimal assistance;+2 for safety/equipment   Toileting- Clothing Manipulation and Hygiene: Total assistance (foley; unaware of having a BM)       Functional mobility during ADLs: Minimal assistance;+2 for safety/equipment       Vision Baseline Vision/History: 1 Wears glasses (reading) Vision Assessment?: Vision impaired- to be further tested in functional context     Perception     Praxis      Pertinent Vitals/Pain Pain Assessment Pain Assessment: Faces Faces Pain Scale: Hurts a little bit Pain Location: scrotum Pain Descriptors /  Indicators: Discomfort Pain Intervention(s): Limited activity within patient's tolerance, Other (comment) (applied nystatin powder)     Hand Dominance (P) Right   Extremity/Trunk Assessment Upper Extremity Assessment Upper Extremity Assessment: Generalized weakness (R apparent RTC  insufficiency, however able to use functionally)   Lower Extremity Assessment Lower Extremity Assessment: Defer to PT evaluation   Cervical / Trunk Assessment Cervical / Trunk Assessment: Kyphotic   Communication Communication Communication: HOH (wears hearing aids)   Cognition Arousal/Alertness: Awake/alert Behavior During Therapy: WFL for tasks assessed/performed Overall Cognitive Status: Difficult to assess                                 General Comments: appropriate at times, however tangential; most likley not at baseline; will continue to assess     General Comments       Exercises Exercises: Other exercises Other Exercises Other Exercises: incentive spirometer x 5 - moderate cues on how to use correctly - able to pull 750 ml   Shoulder Instructions      Home Living Family/patient expects to be discharged to:: Private residence Living Arrangements: Alone Available Help at Discharge: Family;Available PRN/intermittently (son lives in [place] but visits every other week) Type of Home: House Home Access: Stairs to enter [employer] of Steps: 1 Entrance Stairs-Rails: None (plans to build rail) Home Layout: One level     Bathroom Shower/Tub: Occupational psychologist: Standard     Home Equipment: (P) Conservation officer, nature (2 wheels);Cane - single point;Shower seat - built in   Additional Comments: pt is a retired Chiropodist Prior Level of Function : Independent/Modified Independent;Driving             Mobility Comments: Denies falls ADLs Comments: indep        OT Problem List: Decreased strength;Decreased activity tolerance;Impaired balance (sitting and/or standing);Decreased knowledge of use of DME or AE;Cardiopulmonary status limiting activity      OT Treatment/Interventions: Self-care/ADL training;Therapeutic exercise;Energy conservation;DME and/or AE instruction;Therapeutic  activities;Cognitive remediation/compensation;Patient/family education;Balance training    OT Goals(Current goals can be found in the care plan section) Acute Rehab OT Goals Patient Stated Goal: to get moving OT Goal Formulation: With patient Time For Goal Achievement: 02/16/22 Potential to Achieve Goals: Good  OT Frequency: Min 2X/week    Co-evaluation PT/OT/SLP Co-Evaluation/Treatment: Yes Reason for Co-Treatment: For patient/therapist safety;To address functional/ADL transfers   OT goals addressed during session: ADL's and self-care      AM-PAC OT "6 Clicks" Daily Activity     Outcome Measure Help from another person eating meals?: A Little Help from another person taking care of personal grooming?: A Little Help from another person toileting, which includes using toliet, bedpan, or urinal?: Total Help from another person bathing (including washing, rinsing, drying)?: A Lot Help from another person to put on and taking off regular upper body clothing?: A Little Help from another person to put on and taking off regular lower body clothing?: A Lot 6 Click Score: 14   End of Session Equipment Utilized During Treatment: Oxygen (8L) Nurse Communication: Mobility status  Activity Tolerance: Patient tolerated treatment well Patient left: in chair;with call bell/phone within reach;with chair alarm set  OT Visit Diagnosis: Unsteadiness on feet (R26.81);Muscle weakness (generalized) (M62.81);Other symptoms and signs involving cognitive function                Time: 8938-1017 OT Time Calculation (min): 37  min Charges:  OT General Charges $OT Visit: 1 Visit OT Evaluation $OT Eval Moderate Complexity: [place], OT/L   Acute OT Clinical Specialist Acute Rehabilitation Services Pager [phone] Office [phone]   [facility] 02/02/2022, 12:04 PM

## 2022-02-02 NOTE — Progress Notes (Signed)
eLink Physician-Brief Progress Note Patient Name: Allen Wheeler DOB: 07-Feb-1920 MRN: [id number]   Date of Service  02/02/2022  HPI/Events of Note  Patient on diet No hx DM  eICU Interventions  DC CBG q4h     Intervention Category Minor Interventions: Other:  Allen Wheeler 02/02/2022, 10:14 PM

## 2022-02-02 NOTE — Progress Notes (Signed)
NAME:  Allen Wheeler, MRN:  [id number], DOB:  Aug 04, 1919, LOS: 2 ADMISSION DATE:  01/31/2022, CONSULTATION DATE:  10/25 REFERRING MD:  EDP, CHIEF COMPLAINT:  AMS   History of Present Illness:  Allen Wheeler is a 88 y.o. M with PMH of GERD, depression/anxiety and discharged home yesterday after being hospitalized with Covid PNA.   He recently visited [place] to celebrate his 89th birthday and became ill after traveling home.  He was admitted 10/22-10/23 and treated with Molnupavir.  After discharge home, family states he initially seemed to be doing well, however slept most of the day today and was increasingly lethargic, so they returned to the ED.    On arrival he had runs of junctional rhythm and one of Vtach, no hypotension, he was minimally responsive and started on HFNC.  Labs significant for Lactic acid 4.4, procalcitonin 2.24, respiratory alkalosis, creatinine 1.88 and CXR with increasing RLL infiltrate.  Febrile up to 104F even after Tylenol.  He was given 1L LR, Vancomycin and Cefepime and started on Amiodarone gtt.      Code status discussed with patient's son, he states pt is still living independently and has been doing well up until contracting Covid and has lived a very full life.   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  However, given this is a potentially reversible infection, family would want intubation and then transition to comfort care if unable to extubate in a few days.  They would not want chest compressions or defibrillation.   Pertinent  Medical History   has a past medical history of Allergy, Anxiety, Chickenpox, Depression, Dizziness, nonspecific, GERD (gastroesophageal reflux disease), Hearing loss, Nephrolithiasis, Normal cardiac stress test (09-20-12), and Osteoarthritis.  Significant Hospital Events: Including procedures, antibiotic start and stop dates in addition to  other pertinent events   10/25 presented with sepsis and AMS, HFNC admit to PCCM  Interim History / Subjective:  No acute events overnight Alert and awake this morning States he did not get much sleep but has no other concerns  Objective   Blood pressure 136/83, pulse (!) 50, temperature 99 F (37.2 C), resp. rate 16, height '5\' 6"'$  (1.676 m), weight 65.6 kg, SpO2 94 %.    FiO2 (%):  [40 %] 40 %   Intake/Output Summary (Last 24 hours) at 02/02/2022 1112 Last data filed at 02/02/2022 1000 Gross per 24 hour  Intake 1020 ml  Output 1525 ml  Net -505 ml   Filed Weights   01/31/22 2200 01/31/22 2240 02/01/22 0435  Weight: 72 kg 65.6 kg 65.6 kg   Examination: General: Ill-appearing elderly man laying in bed.  NAD. HENT: [place]/AT.  Dry mucous membrane Lungs: Remains on HFNC.  Lung CTAB.  No wheezing or rales. Cardiovascular: Bradycardia. Regular rhythm. No m/r/g.  Abdomen: Soft. NT/ND.  Normal Bowel sounds Extremities: Well perfused.  Warm and dry Neuro: Alert and oriented x3.  No focal neurological deficits. GU: Foley in place  Creatinine 1.48, K+ 3.8, bicarb 21, mag 2.4 WBC 17, Hgb 11.6, platelet 192 Repeat blood cultures pending  Resolved Hospital Problem list   Acute metabolic encephalopathy  Assessment & Plan:  Severe sepsis MRSA bacteremia Postviral pneumonia Acute metabolic encephalopathy, resolved And back to baseline mental status.  Continues to have low-grade fevers with a bump in white count.  Pending repeat blood cultures.  Blood pressure has been  stable.  -ID following, appreciate recs -Continue vancomycin, discontinue cefepime -Off IV fluids, repeat lactate -Encourage p.o. intake -Continue dexamethasone -Trend fever curve, WBC -Follow-up repeat blood cultures  Acute hypoxic respiratory failure Respiratory status has been improving. Congested lung sounds on auscultation.  Down to 9 L HFNC with FiO2 down to 30%. Continue to wean down supplemental  oxygen. -Continue supplemental oxygen, wean for O2 sat goal >92% -As needed guaifenesin -Continue DuoNebs  AKI on CKD IIIb Prerenal in the setting of sepsis.  Kidney function slowly improving. -Encourage p.o. intake -Trend BMP/UOP -Nephrotoxic agents  New HFrEF TTE on 10/26 showed EF 40-45%, moderate LVH, G1DD but no evidence of valvular vegetation. No evidence of heart failure exacerbation.  -Continue telemetry, trend electrolytes -Cardiology referral at discharge   Vtach Junctional bradycardia Episodes of V. tach on admission with rates in the 140s.  Off amiodarone since yesterday.  Had episode of A-fib overnight and remains in sinus bradycardia with HR in the 40s to 60s. -Trend and replete electrolytes -Telemetry   Hypothyroidism TSH 1.26 on 10/22 -Continue Synthroid 50 mcg daily  Best Practice (right click and "Reselect all SmartList Selections" daily)   Diet/type: NPO DVT prophylaxis: prophylactic heparin  GI prophylaxis: N/A Lines: N/A Foley:  Yes, and it is still needed Code Status:  limited, only intubation or BIPAP Last date of multidisciplinary goals of care discussion [updated family at bedside]  Labs   CBC: Recent Labs  Lab 01/29/22 0322 01/30/22 0444 01/31/22 1822 01/31/22 1857 02/01/22 0525 02/02/22 0946  WBC 5.1 6.6 9.8  --  12.6* 17.0*  NEUTROABS 4.3 5.2 7.5  --   --   --   HGB 11.4* 11.5* 12.5* 11.9* 12.0* 11.6*  HCT 34.2* 33.4* 37.4* 35.0* 35.3* 34.1*  MCV 91.4 90.0 94.7  --  91.7 91.9  PLT 199 231 194  --  208 782    Basic Metabolic Panel: Recent Labs  Lab 01/29/22 0322 01/30/22 0444 01/31/22 1822 01/31/22 1857 01/31/22 2013 02/01/22 0525 02/02/22 0946  NA 135 137 137 135  --  134* 134*  K 4.9 4.3 4.3 4.0  --  4.0 3.8  CL 108 108 103  --   --  103 105  CO2 19* 20* 21*  --   --  19* 21*  GLUCOSE 141* 136* 144*  --   --  152* 125*  BUN 27* 22 25*  --   --  23 28*  CREATININE 1.31* 1.24 1.88*  --   --  1.50* 1.48*  CALCIUM 8.3*  8.7* 8.7*  --   --  8.0* 7.9*  MG  --   --   --   --  2.0 2.7* 2.4  PHOS  --   --   --   --   --  3.5  --    GFR: Estimated Creatinine Clearance: 23.9 mL/min (A) (by C-G formula based on SCr of 1.48 mg/dL (H)). Recent Labs  Lab 01/28/22 0804 01/29/22 0322 01/29/22 1210 01/30/22 0444 01/31/22 1822 01/31/22 2013 01/31/22 2015 02/01/22 0525 02/01/22 0728 02/02/22 0946  PROCALCITON  --   --  0.10  --   --  2.24  --   --   --   --   WBC 6.1   < >  --  6.6 9.8  --   --  12.6*  --  17.0*  LATICACIDVEN 1.2  --   --   --  4.4*  --  3.7*  --  2.3*  --    < > =  values in this interval not displayed.    Liver Function Tests: Recent Labs  Lab 01/29/22 0322 01/30/22 0444 01/31/22 1822  AST 22 24 54*  ALT '18 18 29  '$ ALKPHOS 54 51 73  BILITOT 1.1 0.9 1.5*  PROT 6.5 6.4* 6.7  ALBUMIN 2.5* 2.6* 2.7*   No results for input(s): "LIPASE", "AMYLASE" in the last 168 hours. No results for input(s): "AMMONIA" in the last 168 hours.  ABG    Component Value Date/Time   PHART 7.454 (H) 01/31/2022 1857   PCO2ART 21.8 (L) 01/31/2022 1857   PO2ART 71 (L) 01/31/2022 1857   HCO3 15.0 (L) 01/31/2022 1857   TCO2 16 (L) 01/31/2022 1857   ACIDBASEDEF 6.0 (H) 01/31/2022 1857   O2SAT 94 01/31/2022 1857     Coagulation Profile: Recent Labs  Lab 01/31/22 1822  INR 1.3*    Cardiac Enzymes: No results for input(s): "CKTOTAL", "CKMB", "CKMBINDEX", "TROPONINI" in the last 168 hours.  HbA1C: Hgb A1c MFr Bld  Date/Time Value Ref Range Status  01/31/2022 06:22 PM 5.9 (H) 4.8 - 5.6 % Final    Comment:    (NOTE) Pre diabetes:          5.7%-6.4%  Diabetes:              >6.4%  Glycemic control for   <7.0% adults with diabetes     CBG: Recent Labs  Lab 02/01/22 1638 02/01/22 2112 02/01/22 2252 02/02/22 0413 02/02/22 0740  GLUCAP 128* 144* 106* 121* 101*    Review of Systems:   As above  Past Medical History:  He,  has a past medical history of Allergy, Anxiety, Chickenpox,  Depression, Dizziness, nonspecific, GERD (gastroesophageal reflux disease), Hearing loss, Nephrolithiasis, Normal cardiac stress test (09-20-12), and Osteoarthritis.   Surgical History:   Past Surgical History:  Procedure Laterality Date   CATARACT EXTRACTION  2017   CHOLECYSTECTOMY N/A 02/18/2016   Procedure: LAPAROSCOPIC CHOLECYSTECTOMY WITH POSSIBLE INTRAOPERATIVE CHOLANGIOGRAM;  Surgeon: Mickeal Skinner, MD;  Location: [facility];  Service: General;  Laterality: N/A;   ERCP N/A 02/16/2016   Procedure: ENDOSCOPIC RETROGRADE CHOLANGIOPANCREATOGRAPHY (ERCP);  Surgeon: Teena Irani, MD;  Location[facility] END[facility]COPY;  Service: Endoscopy;  Laterality: N/A;   HERNIA REPAIR     wears truss   INGUINAL HERNIA REPAIR Right 06/21/2016   Procedure: OPEN RIGHT INGUINAL HERNIA REPAIR;  Surgeon: [name] Kinsinger, MD;  Location: [facility];  Service: General;  Laterality: Right;   INSERTION OF MESH Right 06/21/2016   Procedure: INSERTION OF MESH;  Surgeon: [name] Kinsinger, MD;  Location: [facility];  Service: General;  Laterality: Right;   MASTOIDECTOMY       Social History:   reports that he has quit smoking. He has never used smokeless tobacco. He reports current alcohol use of about 18.0 standard drinks of alcohol per week. He reports that he does not use drugs.   Family History:  His family history includes Colon cancer in his father; Heart disease in his mother.   Allergies No Known Allergies   Home Medications  Prior to Admission medications   Medication Sig Start Date End Date Taking? Authorizing Provider  citalopram (CELEXA) 10 MG tablet TAKE ONE TABLET ONCE DAILY Patient taking differently: Take 10 mg by mouth in the morning. 09/22/21   [name], MD  diphenoxylate-atropine (LOMOTIL) 2.5-0.025 MG tablet TAKE ONE OR TWO TABLETS BY MOUTH EVERY MORNING Patient taking differently: Take 1-2 tablets by mouth daily as needed for diarrhea or loose  stools. 10/27/21   [name], MD  gabapentin  (NEURONTIN) 100 MG capsule Take 2 capsules (200 mg total) by mouth at bedtime. 08/22/21   [name], MD  hydrOXYzine (VISTARIL) 25 MG capsule TAKE ONE CAPSULE BY MOUTH EVERY 6 HOURS AS NEEDED FOR ITCHING Patient taking differently: Take 25 mg by mouth every 6 (six) hours as needed for itching. 09/27/21   [name], MD  levothyroxine (SYNTHROID) 50 MCG tablet TAKE 1 TABLET ONCE DAILY. Patient taking differently: Take 50 mcg by mouth daily before breakfast. 01/10/21   [name], MD  LORazepam (ATIVAN) 1 MG tablet Take 1 tablet (1 mg total) by mouth at bedtime as needed. Patient taking differently: Take 1 mg by mouth at bedtime as needed for anxiety or sleep. 09/26/21   [name], MD  molnupiravir EUA (LAGEVRIO) 200 mg CAPS capsule Take 4 capsules (800 mg total) by mouth 2 (two) times daily for 3 days. 01/30/22 02/02/22  Shawna Clamp, MD  Multiple Vitamins-Minerals (CENTRUM SILVER ADULT 50+) TABS Take 1 tablet by mouth in the morning.    [provider]  nystatin (MYCOSTATIN/NYSTOP) powder Apply topically 3 (three) times daily. 01/30/22   Shawna Clamp, MD  omeprazole (PRILOSEC) 40 MG capsule TAKE 1 CAPSULE DAILY. Patient taking differently: Take 40 mg by mouth in the morning. 01/10/21   [name], MD  predniSONE (DELTASONE) 10 MG tablet TAKE 1 TABLET ONCE DAILY. Patient taking differently: Take 10 mg by mouth in the morning. 04/12/21   [name], MD  tamsulosin (FLOMAX) 0.4 MG CAPS capsule TAKE TWO CAPSULES BY MOUTH DAILY Patient taking differently: Take 0.8 mg by mouth at bedtime. 03/07/21   [name], MD     Critical care time: 34

## 2022-02-02 NOTE — Evaluation (Signed)
Physical Therapy Evaluation Patient Details Name: Allen Wheeler MRN: [id number] DOB: December 28, 1919 Today's Date: 02/02/2022  History of Present Illness  Patient is 88 year old male with a recent diagnosis of COVID-pneumonia, treated, discharged home then reutrned the following day due to increased lethargy. Pt in junctional bradycardia, with intermittent runs of VT and  was hypoxic and placed on nonrebreather; Pt with acute hypoxic rspoiratory failure, sever sepsis wiht acute metabolic encephalopahthy due to PNA.  Clinical Impression  Pt admitted with above diagnosis. Pt was able to get OOB to chair with min assist and cues with slight desaturation to high 80's with DOE 3/4 and recovered to >90% quickly on 8LHFNC.   Pt fatigues quickly and needs rest breaks due to WOB. Will continue to follow pt and progress pt as able. Pt currently with functional limitations due to the deficits listed below (see PT Problem List). Pt will benefit from skilled PT to increase their independence and safety with mobility to allow discharge to the venue listed below.          Recommendations for follow up therapy are one component of a multi-disciplinary discharge planning process, led by the attending physician.  Recommendations may be updated based on patient status, additional functional criteria and insurance authorization.  Follow Up Recommendations Home health PT      Assistance Recommended at Discharge Intermittent Supervision/Assistance  Patient can return home with the following  A little help with walking and/or transfers;A little help with bathing/dressing/bathroom;Assistance with cooking/housework;Help with stairs or ramp for entrance;Assist for transportation    Equipment Recommendations None recommended by PT  Recommendations for Other Services       Functional Status Assessment Patient has had a recent decline in their functional status and demonstrates the ability to make significant  improvements in function in a reasonable and predictable amount of time.     Precautions / Restrictions Precautions Precautions: Fall Precaution Comments: Monitor O2, covid+ Restrictions Weight Bearing Restrictions: No      Mobility  Bed Mobility Overal bed mobility: Needs Assistance Bed Mobility: Supine to Sit     Supine to sit: Min assist          Transfers Overall transfer level: Needs assistance Equipment used: 2 person hand held assist Transfers: Sit to/from Stand, Bed to chair/wheelchair/BSC Sit to Stand: Min assist   Step pivot transfers: Min assist, +2 safety/equipment       General transfer comment: Needed steadying assist of 2 to pivot to the chair with incr WOB with movement.  Pt was able to take steps but maintained slightly flexed trunk, hips and knees.  DOE 3/4 with need for rest break to catch his breath.  Sats on 8L HFNC as low as 88% with activity and >90% at rest.    Ambulation/Gait                  Stairs            Wheelchair Mobility    Modified Rankin (Stroke Patients Only)       Balance Overall balance assessment: Needs assistance Sitting-balance support: Feet supported Sitting balance-Leahy Scale: Good     Standing balance support: No upper extremity supported, During functional activity Standing balance-Leahy Scale: Fair                               Pertinent Vitals/Pain Pain Assessment Pain Assessment: Faces Faces Pain Scale: Hurts a little bit Pain Location:  scrotum Pain Descriptors / Indicators: Discomfort Pain Intervention(s): Limited activity within patient's tolerance, Monitored during session, Repositioned    Home Living Family/patient expects to be discharged to:: Private residence Living Arrangements: Alone Available Help at Discharge: Family;Available PRN/intermittently (son lives in [place] but visits every other week) Type of Home: House Home Access: Stairs to enter Entrance  Stairs-Rails: None (plans to build rail) Technical brewer of Steps: 1   Home Layout: One level Home Equipment: Conservation officer, nature (2 wheels) Additional Comments: pt is a retired Tax adviser Prior Level of Function : Independent/Modified Independent;Driving             Mobility Comments: Denies falls ADLs Comments: indep     Hand Dominance   Dominant Hand: Right    Extremity/Trunk Assessment   Upper Extremity Assessment Upper Extremity Assessment: Defer to OT evaluation    Lower Extremity Assessment Lower Extremity Assessment: Overall WFL for tasks assessed    Cervical / Trunk Assessment Cervical / Trunk Assessment: Kyphotic  Communication   Communication: HOH (wears hearing aids)  Cognition Arousal/Alertness: Awake/alert Behavior During Therapy: WFL for tasks assessed/performed Overall Cognitive Status: Difficult to assess                                 General Comments: appropriate at times, however tangential; most likley not at baseline; will continue to assess        General Comments      Exercises     Assessment/Plan    PT Assessment Patient needs continued PT services  PT Problem List Decreased strength;Decreased activity tolerance;Decreased balance;Decreased mobility;Decreased coordination;Decreased knowledge of use of DME;Cardiopulmonary status limiting activity       PT Treatment Interventions DME instruction;Gait training;Stair training;Functional mobility training;Therapeutic activities;Therapeutic exercise;Balance training;Neuromuscular re-education;Patient/family education    PT Goals (Current goals can be found in the Care Plan section)  Acute Rehab PT Goals Patient Stated Goal: Get well, go home, have more PT PT Goal Formulation: With patient Time For Goal Achievement: 02/16/22 Potential to Achieve Goals: Good    Frequency Min 3X/week     Co-evaluation PT/OT/SLP Co-Evaluation/Treatment:  Yes Reason for Co-Treatment: Complexity of the patient's impairments (multi-system involvement);For patient/therapist safety PT goals addressed during session: Mobility/safety with mobility         AM-PAC PT "6 Clicks" Mobility  Outcome Measure Help needed turning from your back to your side while in a flat bed without using bedrails?: None Help needed moving from lying on your back to sitting on the side of a flat bed without using bedrails?: None Help needed moving to and from a bed to a chair (including a wheelchair)?: A Lot Help needed standing up from a chair using your arms (e.g., wheelchair or bedside chair)?: A Little Help needed to walk in hospital room?: Total Help needed climbing 3-5 steps with a railing? : A Lot 6 Click Score: 16    End of Session Equipment Utilized During Treatment: Gait belt;Oxygen Activity Tolerance: Patient limited by fatigue Patient left: with call bell/phone within reach;in chair;with chair alarm set Nurse Communication: Mobility status PT Visit Diagnosis: Unsteadiness on feet (R26.81);Other abnormalities of gait and mobility (R26.89);Muscle weakness (generalized) (M62.81);Difficulty in walking, not elsewhere classified (R26.2)    Time: 0932-3557 PT Time Calculation (min) (ACUTE ONLY): 23 min   Charges:   PT Evaluation $PT Eval Moderate Complexity: 1 Mod          Burnette Valenti M,PT  Acute Rehab Services [phone]   Alvira Philips 02/02/2022, 3:09 PM

## 2022-02-03 DIAGNOSIS — R7881 Bacteremia: Secondary | ICD-10-CM | POA: Diagnosis not present

## 2022-02-03 DIAGNOSIS — U071 COVID-19: Secondary | ICD-10-CM | POA: Diagnosis not present

## 2022-02-03 DIAGNOSIS — J9601 Acute respiratory failure with hypoxia: Secondary | ICD-10-CM | POA: Diagnosis not present

## 2022-02-03 DIAGNOSIS — B9562 Methicillin resistant Staphylococcus aureus infection as the cause of diseases classified elsewhere: Secondary | ICD-10-CM | POA: Diagnosis not present

## 2022-02-03 LAB — BASIC METABOLIC PANEL
Anion gap: 12 (ref 5–15)
BUN: 27 mg/dL — ABNORMAL HIGH (ref 8–23)
CO2: 19 mmol/L — ABNORMAL LOW (ref 22–32)
Calcium: 7.9 mg/dL — ABNORMAL LOW (ref 8.9–10.3)
Chloride: 103 mmol/L (ref 98–111)
Creatinine, Ser: 1.35 mg/dL — ABNORMAL HIGH (ref 0.61–1.24)
GFR, Estimated: 47 mL/min — ABNORMAL LOW (ref >60)
Glucose, Bld: 128 mg/dL — ABNORMAL HIGH (ref 70–99)
Potassium: 3.8 mmol/L (ref 3.5–5.1)
Sodium: 134 mmol/L — ABNORMAL LOW (ref 135–145)

## 2022-02-03 LAB — CULTURE, BLOOD (ROUTINE X 2)
Special Requests: ADEQUATE
Special Requests: ADEQUATE

## 2022-02-03 LAB — CBC
HCT: 34.1 % — ABNORMAL LOW (ref 39.0–52.0)
Hemoglobin: 11.7 g/dL — ABNORMAL LOW (ref 13.0–17.0)
MCH: 31.1 pg (ref 26.0–34.0)
MCHC: 34.3 g/dL (ref 30.0–36.0)
MCV: 90.7 fL (ref 80.0–100.0)
Platelets: 192 10*3/uL (ref 150–400)
RBC: 3.76 MIL/uL — ABNORMAL LOW (ref 4.22–5.81)
RDW: 14.2 % (ref 11.5–15.5)
WBC: 15.7 10*3/uL — ABNORMAL HIGH (ref 4.0–10.5)
nRBC: 0.1 % (ref 0.0–0.2)

## 2022-02-03 LAB — MAGNESIUM: Magnesium: 2 mg/dL (ref 1.7–2.4)

## 2022-02-03 LAB — PROCALCITONIN: Procalcitonin: 1.28 ng/mL

## 2022-02-03 LAB — VANCOMYCIN, PEAK: Vancomycin Pk: 17 ug/mL — ABNORMAL LOW (ref 30–40)

## 2022-02-03 LAB — GLUCOSE, CAPILLARY: Glucose-Capillary: 124 mg/dL — ABNORMAL HIGH (ref 70–99)

## 2022-02-03 LAB — C-REACTIVE PROTEIN: CRP: 30.4 mg/dL — ABNORMAL HIGH (ref <1.0)

## 2022-02-03 NOTE — Progress Notes (Signed)
Patient awoke this am confused, disoriented. Pulling off all equipment , leads, etc. Found standing OOB. Unsteady gait.  Was able to answer orientation questions correctly shortly thereafter , only to once again find pt pulling off leads, pulse ox. Patient in bed. Bed alarm on. Resting quietly at this time. Will continue to monitor.

## 2022-02-03 NOTE — Progress Notes (Addendum)
NAME:  Allen Wheeler, MRN:  [id number], DOB:  01-03-1920, LOS: 3 ADMISSION DATE:  01/31/2022, CONSULTATION DATE:  10/25 REFERRING MD:  EDP, CHIEF COMPLAINT:  AMS   History of Present Illness:  Allen Wheeler is a 88 y.o. M with PMH of GERD, depression/anxiety and discharged home yesterday after being hospitalized with Covid PNA.   He recently visited [place] to celebrate his 89th birthday and became ill after traveling home.  He was admitted 10/22-10/23 and treated with Molnupavir.  After discharge home, family states he initially seemed to be doing well, however slept most of the day today and was increasingly lethargic, so they returned to the ED.    On arrival he had runs of junctional rhythm and one of Vtach, no hypotension, he was minimally responsive and started on HFNC.  Labs significant for Lactic acid 4.4, procalcitonin 2.24, respiratory alkalosis, creatinine 1.88 and CXR with increasing RLL infiltrate.  Febrile up to 104F even after Tylenol.  He was given 1L LR, Vancomycin and Cefepime and started on Amiodarone gtt.    Code status discussed with patient's son, he states pt is still living independently and has been doing well up until contracting Covid and has lived a very full life.   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  However, given this is a potentially reversible infection, family would want intubation and then transition to comfort care if unable to extubate in a few days.  They would not want chest compressions or defibrillation.   Pertinent  Medical History   has a past medical history of Allergy, Anxiety, Chickenpox, Depression, Dizziness, nonspecific, GERD (gastroesophageal reflux disease), Hearing loss, Nephrolithiasis, Normal cardiac stress test (09-20-12), and Osteoarthritis.  Significant Hospital Events: Including procedures, antibiotic start and stop dates in addition to  other pertinent events   10/25 presented with sepsis and AMS, HFNC admit to [facility] 10/25 blood culture >> MRSA 10/27 Repeat blood culture >> NGTD  Interim History / Subjective:  Patient confused and disoriented earlier this morning and pulling on equipment.  He was easily reoriented.  Patient back to baseline mental status this morning, remembers being confused O2 needs have been going down.  Objective   Blood pressure 124/60, pulse (!) 41, temperature 99.1 F (37.3 C), resp. rate (!) 22, height '5\' 6"'$  (1.676 m), weight 65.6 kg, SpO2 97 %.    FiO2 (%):  [40 %] 40 %   Intake/Output Summary (Last 24 hours) at 02/03/2022 0732 Last data filed at 02/03/2022 0500 Gross per 24 hour  Intake 299.93 ml  Output 1250 ml  Net -950.07 ml   Filed Weights   01/31/22 2200 01/31/22 2240 02/01/22 0435  Weight: 72 kg 65.6 kg 65.6 kg   Examination: General: Well-appearing elderly male laying in bed. NAD. HENT: [place]/AT. Dry mucous membrane Lungs: On 4 L [place].  Lungs CTAB.  No wheezing or rales. Cardiovascular: Bradycardia. Regular rhythm. No m/r/g.  Abdomen: Soft. NT/ND.  Normal Bowel sounds Extremities: Well perfused. Warm and dry Neuro: Alert and oriented x3.  No focal neurological deficits. GU: Blood around Foley, testicles and penile meatus   Creatinine 1.35, K+ 3.8, bicarb 19, mag 2.0 White count trended down to 15.7, Hgb 11.7, platelet 192 Repeat cultures no growth after 24 hours  Resolved Hospital Problem list   Acute metabolic encephalopathy  Assessment & Plan:  Severe sepsis MRSA bacteremia Postviral  pneumonia Acute metabolic encephalopathy, resolved Fever curve trending down, leukocytosis slowly improving.  Repeat blood cultures no growth after 24 hours. Can transition to 2 weeks course of linezolid if repeat blood cultures remain negative after 48 hours. -ID following, appreciate recs -Continue vancomycin, discontinue cefepime -Continue dexamethasone, can discontinue at  discharge -Trend fever curve, WBC -Follow-up repeat blood cultures -Transfer to progressive bed  Acute hypoxic respiratory failure Respiratory status has gradually improved.  Down to 4 L [place].  Lungs are clear to auscultation. -Continue supplemental oxygen, wean for O2 sat goal >92% -As needed guaifenesin -Continue DuoNebs  Foley catheter trauma Hematuria BPH Testicular irritation Patient pulled on Foley catheter due to confusion earlier this morning. Found to have blood around penile meatus, Foley and testicles.  Patient having profuse bleeding from penile meatus after Foley removal. Denies any pain but endorsed some irritation around testicles. -Urology consulted, appreciate recs -Continue nystatin topical powder TID -Continue tamsulosin 0.8 mg daily  AKI on CKD IIIb Prerenal in the setting of sepsis.  Kidney function continues to improve.  Creatinine down to 1.35. -Encourage p.o. intake -Trend BMP/UOP -Nephrotoxic agents  New HFrEF TTE on 10/26 showed EF 40-45%, moderate LVH, G1DD but no evidence of valvular vegetation. No evidence of heart failure exacerbation.  -Continue telemetry, trend electrolytes -Consider cardiology referral at discharge   Vtach Junctional bradycardia Episodes of V. tach on admission with rates in the 140s.  Improved after starting amiodarone. Off amiodarone and HR in the 40s to 60s.  Normal electrolytes. -Trend and replete electrolytes -Telemetry   Hypothyroidism TSH 1.26 on 10/22 -Continue Synthroid 50 mcg daily  Best Practice (right click and "Reselect all SmartList Selections" daily)   Diet/type: NPO DVT prophylaxis: prophylactic heparin  GI prophylaxis: N/A Lines: N/A Foley:  Yes, and it is still needed Code Status:  limited, only intubation or BIPAP Last date of multidisciplinary goals of care discussion [updated family at bedside]  Labs   CBC: Recent Labs  Lab 01/29/22 0322 01/30/22 0444 01/31/22 1822 01/31/22 1857  02/01/22 0525 02/02/22 0946  WBC 5.1 6.6 9.8  --  12.6* 17.0*  NEUTROABS 4.3 5.2 7.5  --   --   --   HGB 11.4* 11.5* 12.5* 11.9* 12.0* 11.6*  HCT 34.2* 33.4* 37.4* 35.0* 35.3* 34.1*  MCV 91.4 90.0 94.7  --  91.7 91.9  PLT 199 231 194  --  208 161    Basic Metabolic Panel: Recent Labs  Lab 01/30/22 0444 01/31/22 1822 01/31/22 1857 01/31/22 2013 02/01/22 0525 02/02/22 0946 02/03/22 0111  NA 137 137 135  --  134* 134* 134*  K 4.3 4.3 4.0  --  4.0 3.8 3.8  CL 108 103  --   --  103 105 103  CO2 20* 21*  --   --  19* 21* 19*  GLUCOSE 136* 144*  --   --  152* 125* 128*  BUN 22 25*  --   --  23 28* 27*  CREATININE 1.24 1.88*  --   --  1.50* 1.48* 1.35*  CALCIUM 8.7* 8.7*  --   --  8.0* 7.9* 7.9*  MG  --   --   --  2.0 2.7* 2.4 2.0  PHOS  --   --   --   --  3.5  --   --    GFR: Estimated Creatinine Clearance: 26.3 mL/min (A) (by C-G formula based on SCr of 1.35 mg/dL (H)). Recent Labs  Lab 01/29/22 1210 01/30/22 0444 01/31/22  1822 01/31/22 2013 01/31/22 2015 02/01/22 0525 02/01/22 0728 02/02/22 0946 02/02/22 1159  PROCALCITON 0.10  --   --  2.24  --   --   --   --   --   WBC  --  6.6 9.8  --   --  12.6*  --  17.0*  --   LATICACIDVEN  --   --  4.4*  --  3.7*  --  2.3*  --  1.8    Liver Function Tests: Recent Labs  Lab 01/29/22 0322 01/30/22 0444 01/31/22 1822  AST 22 24 54*  ALT '18 18 29  '$ ALKPHOS 54 51 73  BILITOT 1.1 0.9 1.5*  PROT 6.5 6.4* 6.7  ALBUMIN 2.5* 2.6* 2.7*   No results for input(s): "LIPASE", "AMYLASE" in the last 168 hours. No results for input(s): "AMMONIA" in the last 168 hours.  ABG    Component Value Date/Time   PHART 7.454 (H) 01/31/2022 1857   PCO2ART 21.8 (L) 01/31/2022 1857   PO2ART 71 (L) 01/31/2022 1857   HCO3 15.0 (L) 01/31/2022 1857   TCO2 16 (L) 01/31/2022 1857   ACIDBASEDEF 6.0 (H) 01/31/2022 1857   O2SAT 94 01/31/2022 1857     Coagulation Profile: Recent Labs  Lab 01/31/22 1822  INR 1.3*    Cardiac Enzymes: No  results for input(s): "CKTOTAL", "CKMB", "CKMBINDEX", "TROPONINI" in the last 168 hours.  HbA1C: Hgb A1c MFr Bld  Date/Time Value Ref Range Status  01/31/2022 06:22 PM 5.9 (H) 4.8 - 5.6 % Final    Comment:    (NOTE) Pre diabetes:          5.7%-6.4%  Diabetes:              >6.4%  Glycemic control for   <7.0% adults with diabetes     CBG: Recent Labs  Lab 02/02/22 0413 02/02/22 0740 02/02/22 1122 02/02/22 1527 02/02/22 1931  GLUCAP 121* 101* 98 148* 95    Review of Systems:   As above  Past Medical History:  He,  has a past medical history of Allergy, Anxiety, Chickenpox, Depression, Dizziness, nonspecific, GERD (gastroesophageal reflux disease), Hearing loss, Nephrolithiasis, Normal cardiac stress test (09-20-12), and Osteoarthritis.   Surgical History:   Past Surgical History:  Procedure Laterality Date   CATARACT EXTRACTION  2017   CHOLECYSTECTOMY N/A 02/18/2016   Procedure: LAPAROSCOPIC CHOLECYSTECTOMY WITH POSSIBLE INTRAOPERATIVE CHOLANGIOGRAM;  Surgeon: Mickeal Skinner, MD;  Location: [place];  Service: General;  Laterality: N/A;   ERCP N/A 02/16/2016   Procedure: ENDOSCOPIC RETROGRADE CHOLANGIOPANCREATOGRAPHY (ERCP);  Surgeon: Teena Irani, MD;  Location[facility] END[facility];  Service: Endoscopy;  Laterality: N/A;   HERNIA REPAIR     wears truss   INGUINAL HERNIA REPAIR Right 06/21/2016   Procedure: OPEN RIGHT INGUINAL HERNIA REPAIR;  Surgeon: [name] Kinsinger, MD;  Location: [facility];  Service: General;  Laterality: Right;   INSERTION OF MESH Right 06/21/2016   Procedure: INSERTION OF MESH;  Surgeon: [name] Kinsinger, MD;  Location: [facility];  Service: General;  Laterality: Right;   MASTOIDECTOMY       Social History:   reports that he has quit smoking. He has never used smokeless tobacco. He reports current alcohol use of about 18.0 standard drinks of alcohol per week. He reports that he does not use drugs.   Family History:  His family history includes Colon  cancer in his father; Heart disease in his mother.   Allergies No Known Allergies   Home Medications  Prior to Admission medications   Medication Sig Start Date End Date Taking? Authorizing Provider  citalopram (CELEXA) 10 MG tablet TAKE ONE TABLET ONCE DAILY Patient taking differently: Take 10 mg by mouth in the morning. 09/22/21   [name], MD  diphenoxylate-atropine (LOMOTIL) 2.5-0.025 MG tablet TAKE ONE OR TWO TABLETS BY MOUTH EVERY MORNING Patient taking differently: Take 1-2 tablets by mouth daily as needed for diarrhea or loose stools. 10/27/21   [name], MD  gabapentin (NEURONTIN) 100 MG capsule Take 2 capsules (200 mg total) by mouth at bedtime. 08/22/21   [name], MD  hydrOXYzine (VISTARIL) 25 MG capsule TAKE ONE CAPSULE BY MOUTH EVERY 6 HOURS AS NEEDED FOR ITCHING Patient taking differently: Take 25 mg by mouth every 6 (six) hours as needed for itching. 09/27/21   [name], MD  levothyroxine (SYNTHROID) 50 MCG tablet TAKE 1 TABLET ONCE DAILY. Patient taking differently: Take 50 mcg by mouth daily before breakfast. 01/10/21   [name], MD  LORazepam (ATIVAN) 1 MG tablet Take 1 tablet (1 mg total) by mouth at bedtime as needed. Patient taking differently: Take 1 mg by mouth at bedtime as needed for anxiety or sleep. 09/26/21   [name], MD  molnupiravir EUA (LAGEVRIO) 200 mg CAPS capsule Take 4 capsules (800 mg total) by mouth 2 (two) times daily for 3 days. 01/30/22 02/02/22  Shawna Clamp, MD  Multiple Vitamins-Minerals (CENTRUM SILVER ADULT 50+) TABS Take 1 tablet by mouth in the morning.    [provider]  nystatin (MYCOSTATIN/NYSTOP) powder Apply topically 3 (three) times daily. 01/30/22   Shawna Clamp, MD  omeprazole (PRILOSEC) 40 MG capsule TAKE 1 CAPSULE DAILY. Patient taking differently: Take 40 mg by mouth in the morning. 01/10/21   [name], MD  predniSONE (DELTASONE) 10 MG tablet TAKE 1 TABLET ONCE DAILY. Patient taking  differently: Take 10 mg by mouth in the morning. 04/12/21   [name], MD  tamsulosin (FLOMAX) 0.4 MG CAPS capsule TAKE TWO CAPSULES BY MOUTH DAILY Patient taking differently: Take 0.8 mg by mouth at bedtime. 03/07/21   [name], MD     Critical care time: 37

## 2022-02-03 NOTE — Progress Notes (Signed)
Patient transferred to 5W room 1, without any complications, hearing aids and belongings place at bedside.

## 2022-02-03 NOTE — Consult Note (Signed)
Reason for Consult: Catheter Trauma / Hematuria  Referring Physician:  Junius Finner MD  Allen Wheeler is an 88 y.o. male.   HPI:   1 - Severe Catheter Trauma / Hematuria - new gross blood per penis after pt pulled out his catheter durin admission for pneumonia. No strong blood thinners. Prostaet vole 50gm (NOT enlarged) by CT ellipsoid calculation from 2017. Required bedside cysto to navigate injured bulbar urethral and 71F catheter placed 10/28.   Today "Allen Wheeler" is seen as urgent consult for above. Presently admitted for suspect bacterial PNA after recent C19 infection recently prior.   Past Medical History:  Diagnosis Date   Allergy    Anxiety    Chickenpox    Depression    Dizziness, nonspecific    loss of balance last few months   GERD (gastroesophageal reflux disease)    Hearing loss    wears hearing aids   Nephrolithiasis    Normal cardiac stress test 09-20-12   Osteoarthritis     Past Surgical History:  Procedure Laterality Date   CATARACT EXTRACTION  2017   CHOLECYSTECTOMY N/A 02/18/2016   Procedure: LAPAROSCOPIC CHOLECYSTECTOMY WITH POSSIBLE INTRAOPERATIVE CHOLANGIOGRAM;  Surgeon: Mickeal Skinner, MD;  Location: [facility];  Service: General;  Laterality: N/A;   ERCP N/A 02/16/2016   Procedure: ENDOSCOPIC RETROGRADE CHOLANGIOPANCREATOGRAPHY (ERCP);  Surgeon: Teena Irani, MD;  Location: [facility] END[facility]COPY;  Service: Endoscopy;  Laterality: N/A;   HERNIA REPAIR     wears truss   INGUINAL HERNIA REPAIR Right 06/21/2016   Procedure: OPEN RIGHT INGUINAL HERNIA REPAIR;  Surgeon: [name] Kinsinger, MD;  Location[facility];  Service: General;  Laterality: Right;   INSERTION OF MESH Right 06/21/2016   Procedure: INSERTION OF MESH;  Surgeon: [name] Kinsinger, MD;  [facility];  Service: General;  Laterality: Right;   MASTOIDECTOMY      Family History  Problem Relation Age of Onset   Heart disease Mother        Pacemaker   Colon cancer Father     Social History:   reports that he has quit smoking. He has never used smokeless tobacco. He reports current alcohol use of about 18.0 standard drinks of alcohol per week. He reports that he does not use drugs.  Allergies: No Known Allergies  Medications: I have reviewed the patient's current medications.  Results for orders placed or performed during the hospital encounter of 01/31/22 (from the past 48 hour(s))  Glucose, capillary     Status: Abnormal   Collection Time: 02/01/22  4:38 PM  Result Value Ref Range   Glucose-Capillary 128 (H) 70 - 99 mg/dL    Comment: Glucose reference range applies only to samples taken after fasting for at least 8 hours.  Glucose, capillary     Status: Abnormal   Collection Time: 02/01/22  9:12 PM  Result Value Ref Range   Glucose-Capillary 144 (H) 70 - 99 mg/dL    Comment: Glucose reference range applies only to samples taken after fasting for at least 8 hours.  Glucose, capillary     Status: Abnormal   Collection Time: 02/01/22 10:52 PM  Result Value Ref Range   Glucose-Capillary 106 (H) 70 - 99 mg/dL    Comment: Glucose reference range applies only to samples taken after fasting for at least 8 hours.  Glucose, capillary     Status: Abnormal   Collection Time: 02/02/22  4:13 AM  Result Value Ref Range   Glucose-Capillary 121 (H) 70 -  99 mg/dL    Comment: Glucose reference range applies only to samples taken after fasting for at least 8 hours.  Culture, blood (Routine X 2) w Reflex to ID Panel     Status: None (Preliminary result)   Collection Time: 02/02/22  6:03 AM   Specimen: BLOOD RIGHT HAND  Result Value Ref Range   Specimen Description BLOOD RIGHT HAND    Special Requests      BOTTLES DRAWN AEROBIC AND ANAEROBIC Blood Culture adequate volume   Culture      NO GROWTH 1 DAY Performed at [facility], [place] [address], [place], [place] [address]    Report Status PENDING   Culture, blood (Routine X 2) w Reflex to ID Panel     Status: None (Preliminary  result)   Collection Time: 02/02/22  6:05 AM   Specimen: BLOOD LEFT HAND  Result Value Ref Range   Specimen Description BLOOD LEFT HAND    Special Requests      BOTTLES DRAWN AEROBIC AND ANAEROBIC Blood Culture adequate volume   Culture      NO GROWTH 1 DAY Performed at [facility], [place] [address], [place], [place] [address]    Report Status PENDING   Glucose, capillary     Status: Abnormal   Collection Time: 02/02/22  7:40 AM  Result Value Ref Range   Glucose-Capillary 101 (H) 70 - 99 mg/dL    Comment: Glucose reference range applies only to samples taken after fasting for at least 8 hours.  Basic metabolic panel     Status: Abnormal   Collection Time: 02/02/22  9:46 AM  Result Value Ref Range   Sodium 134 (L) 135 - 145 mmol/L   Potassium 3.8 3.5 - 5.1 mmol/L   Chloride 105 98 - 111 mmol/L   CO2 21 (L) 22 - 32 mmol/L   Glucose, Bld 125 (H) 70 - 99 mg/dL    Comment: Glucose reference range applies only to samples taken after fasting for at least 8 hours.   BUN 28 (H) 8 - 23 mg/dL   Creatinine, Ser 1.48 (H) 0.61 - 1.24 mg/dL   Calcium 7.9 (L) 8.9 - 10.3 mg/dL   GFR, Estimated 42 (L) >60 mL/min    Comment: (NOTE) Calculated using the CKD-EPI Creatinine Equation (2021)    Anion gap 8 5 - 15    Comment: Performed at [facility] [address] [place], [place] [address]  Magnesium     Status: None   Collection Time: 02/02/22  9:46 AM  Result Value Ref Range   Magnesium 2.4 1.7 - 2.4 mg/dL    Comment: Performed at [facility] [address], [place], [place] [address]  CBC     Status: Abnormal   Collection Time: 02/02/22  9:46 AM  Result Value Ref Range   WBC 17.0 (H) 4.0 - 10.5 K/uL   RBC 3.71 (L) 4.22 - 5.81 MIL/uL   Hemoglobin 11.6 (L) 13.0 - 17.0 g/dL   HCT 34.1 (L) 39.0 - 52.0 %   MCV 91.9 80.0 - 100.0 fL   MCH 31.3 26.0 - 34.0 pg   MCHC 34.0 30.0 - 36.0 g/dL   RDW 14.1 11.5 - 15.5 %   Platelets 192 150 - 400 K/uL   nRBC 0.0 0.0 - 0.2 %     Comment: Performed at [facility], [place] [address] [place], [place] [address]  Glucose, capillary  Status: None   Collection Time: 02/02/22 11:22 AM  Result Value Ref Range   Glucose-Capillary 98 70 - 99 mg/dL    Comment: Glucose reference range applies only to samples taken after fasting for at least 8 hours.  Lactic acid, plasma     Status: None   Collection Time: 02/02/22 11:59 AM  Result Value Ref Range   Lactic Acid, Venous 1.8 0.5 - 1.9 mmol/L    Comment: Performed at [facility] [address], [place], [place] [address]  Glucose, capillary     Status: Abnormal   Collection Time: 02/02/22  3:27 PM  Result Value Ref Range   Glucose-Capillary 148 (H) 70 - 99 mg/dL    Comment: Glucose reference range applies only to samples taken after fasting for at least 8 hours.  Glucose, capillary     Status: None   Collection Time: 02/02/22  7:31 PM  Result Value Ref Range   Glucose-Capillary 95 70 - 99 mg/dL    Comment: Glucose reference range applies only to samples taken after fasting for at least 8 hours.  Basic metabolic panel     Status: Abnormal   Collection Time: 02/03/22  1:11 AM  Result Value Ref Range   Sodium 134 (L) 135 - 145 mmol/L   Potassium 3.8 3.5 - 5.1 mmol/L   Chloride 103 98 - 111 mmol/L   CO2 19 (L) 22 - 32 mmol/L   Glucose, Bld 128 (H) 70 - 99 mg/dL    Comment: Glucose reference range applies only to samples taken after fasting for at least 8 hours.   BUN 27 (H) 8 - 23 mg/dL   Creatinine, Ser 1.35 (H) 0.61 - 1.24 mg/dL   Calcium 7.9 (L) 8.9 - 10.3 mg/dL   GFR, Estimated 47 (L) >60 mL/min    Comment: (NOTE) Calculated using the CKD-EPI Creatinine Equation (2021)    Anion gap 12 5 - 15    Comment: Performed at [facility] [address] [place], [place] [address]  Magnesium     Status: None   Collection Time: 02/03/22  1:11 AM  Result Value Ref Range   Magnesium 2.0 1.7 - 2.4 mg/dL    Comment: Performed at [facility] [address], [place], [place] [address]  Glucose, capillary     Status: Abnormal   Collection Time: 02/03/22  7:42 AM  Result Value Ref Range   Glucose-Capillary 124 (H) 70 - 99 mg/dL    Comment: Glucose reference range applies only to samples taken after fasting for at least 8 hours.  CBC     Status: Abnormal   Collection Time: 02/03/22 10:43 AM  Result Value Ref Range   WBC 15.7 (H) 4.0 - 10.5 K/uL   RBC 3.76 (L) 4.22 - 5.81 MIL/uL   Hemoglobin 11.7 (L) 13.0 - 17.0 g/dL   HCT 34.1 (L) 39.0 - 52.0 %   MCV 90.7 80.0 - 100.0 fL   MCH 31.1 26.0 - 34.0 pg   MCHC 34.3 30.0 - 36.0 g/dL   RDW 14.2 11.5 - 15.5 %   Platelets 192 150 - 400 K/uL   nRBC 0.1 0.0 - 0.2 %    Comment: Performed at [facility], [place] [address], [place], [place] [address]    No results found.  Review of Systems  Constitutional:  Positive for chills and fever.  Genitourinary:  Positive for hematuria and penile pain.   Blood pressure (!) 138/113,  pulse (!) 48, temperature 98.6 F (37 C), resp. rate 16, height '5\' 6"'$  (1.676 m), weight 65.6 kg, SpO2 99 %. Physical Exam Vitals reviewed.  Constitutional:      Comments: Frail, very hard of hearing. AOx 3.   HENT:     Nose: Nose normal.  Eyes:     Pupils: Pupils are equal, round, and reactive to light.  Cardiovascular:     Rate and Rhythm: Normal rate.  Pulmonary:     Effort: Pulmonary effort is normal.  Abdominal:     General: Abdomen is flat.  Genitourinary:    Comments: Large formed clot at meatus and some persistant ooze.  Musculoskeletal:        General: Normal range of motion.     Cervical back: Normal range of motion.  Skin:    General: Skin is warm.  Neurological:     General: No focal deficit present.  Psychiatric:        Mood and Affect: Mood normal.    BEDSIDE CYSTO / CATHETER PLACEMENT COMLICATED:  Using aseptic technique single attempt made to place 107F 3 way cathteter, resiscance at bulbar urethra. Next cystourethroscopy performed  with 37F flexible scope. Large false pass / trauma area in bulbar urethra. True lumen found near 12 o clock and navigated into bladder where large urine but no sig formed clot found. Sensor wire advanced and scope exchanges for 107F 3 way foley over the wire with immediate return about 613m pink urine. 20cc water placed in balloon. Irrigated but minimal blood in bladder confirmeing urethral source of blood which is now significantly improved via tamponade from large catheter. Placed on 2 strap traction and NSG explained and importance of not pulling out. Pt also voiced understanding of need for not manipulating.   Assessment/Plan:  Now temporized with large bore catheter which will tamponade urethral source bleeding / trauma and allow for urethral healing. Current catheter needs to stay at least 2 weeks. We will manage.   Strongly consider mittens if pt winds up confused and tugging on it again and/or placing pants on the patient with drawtring with catheter tubing going down the leg so he cannot get to the catheter directly.   TAlexis Frock10/28/2023, 11:53 AM

## 2022-02-04 ENCOUNTER — Inpatient Hospital Stay (HOSPITAL_COMMUNITY): Payer: Medicare Other

## 2022-02-04 DIAGNOSIS — R7881 Bacteremia: Secondary | ICD-10-CM | POA: Diagnosis not present

## 2022-02-04 DIAGNOSIS — B9562 Methicillin resistant Staphylococcus aureus infection as the cause of diseases classified elsewhere: Secondary | ICD-10-CM | POA: Diagnosis not present

## 2022-02-04 LAB — CBC WITH DIFFERENTIAL/PLATELET
Abs Immature Granulocytes: 0.1 10*3/uL — ABNORMAL HIGH (ref 0.00–0.07)
Basophils Absolute: 0 10*3/uL (ref 0.0–0.1)
Basophils Relative: 0 %
Eosinophils Absolute: 0 10*3/uL (ref 0.0–0.5)
Eosinophils Relative: 0 %
HCT: 31 % — ABNORMAL LOW (ref 39.0–52.0)
Hemoglobin: 10.9 g/dL — ABNORMAL LOW (ref 13.0–17.0)
Immature Granulocytes: 1 %
Lymphocytes Relative: 4 %
Lymphs Abs: 0.4 10*3/uL — ABNORMAL LOW (ref 0.7–4.0)
MCH: 31.5 pg (ref 26.0–34.0)
MCHC: 35.2 g/dL (ref 30.0–36.0)
MCV: 89.6 fL (ref 80.0–100.0)
Monocytes Absolute: 0.3 10*3/uL (ref 0.1–1.0)
Monocytes Relative: 3 %
Neutro Abs: 10.4 10*3/uL — ABNORMAL HIGH (ref 1.7–7.7)
Neutrophils Relative %: 92 %
Platelets: 210 10*3/uL (ref 150–400)
RBC: 3.46 MIL/uL — ABNORMAL LOW (ref 4.22–5.81)
RDW: 13.9 % (ref 11.5–15.5)
WBC: 11.2 10*3/uL — ABNORMAL HIGH (ref 4.0–10.5)
nRBC: 0 % (ref 0.0–0.2)

## 2022-02-04 LAB — COMPREHENSIVE METABOLIC PANEL
ALT: 27 U/L (ref 0–44)
AST: 19 U/L (ref 15–41)
Albumin: 2 g/dL — ABNORMAL LOW (ref 3.5–5.0)
Alkaline Phosphatase: 48 U/L (ref 38–126)
Anion gap: 8 (ref 5–15)
BUN: 23 mg/dL (ref 8–23)
CO2: 23 mmol/L (ref 22–32)
Calcium: 7.8 mg/dL — ABNORMAL LOW (ref 8.9–10.3)
Chloride: 103 mmol/L (ref 98–111)
Creatinine, Ser: 1.14 mg/dL (ref 0.61–1.24)
GFR, Estimated: 57 mL/min — ABNORMAL LOW (ref >60)
Glucose, Bld: 144 mg/dL — ABNORMAL HIGH (ref 70–99)
Potassium: 3.8 mmol/L (ref 3.5–5.1)
Sodium: 134 mmol/L — ABNORMAL LOW (ref 135–145)
Total Bilirubin: 1 mg/dL (ref 0.3–1.2)
Total Protein: 5.6 g/dL — ABNORMAL LOW (ref 6.5–8.1)

## 2022-02-04 LAB — BRAIN NATRIURETIC PEPTIDE: B Natriuretic Peptide: 1035.7 pg/mL — ABNORMAL HIGH (ref 0.0–100.0)

## 2022-02-04 LAB — C-REACTIVE PROTEIN: CRP: 13.2 mg/dL — ABNORMAL HIGH (ref <1.0)

## 2022-02-04 LAB — MAGNESIUM: Magnesium: 2 mg/dL (ref 1.7–2.4)

## 2022-02-04 LAB — TSH: TSH: 0.543 u[IU]/mL (ref 0.350–4.500)

## 2022-02-04 LAB — T4, FREE: Free T4: 0.87 ng/dL (ref 0.61–1.12)

## 2022-02-04 LAB — PROCALCITONIN: Procalcitonin: 0.49 ng/mL

## 2022-02-04 LAB — VANCOMYCIN, RANDOM: Vancomycin Rm: 8 ug/mL

## 2022-02-04 MED ORDER — FUROSEMIDE 40 MG PO TABS
40.0000 mg | ORAL_TABLET | Freq: Once | ORAL | Status: AC
  Administered 2022-02-04: 40 mg via ORAL
  Filled 2022-02-04: qty 1

## 2022-02-04 MED ORDER — VANCOMYCIN HCL 1250 MG/250ML IV SOLN
1250.0000 mg | INTRAVENOUS | Status: DC
  Administered 2022-02-04 – 2022-02-05 (×2): 1250 mg via INTRAVENOUS
  Filled 2022-02-04 (×2): qty 250

## 2022-02-04 NOTE — Progress Notes (Signed)
Pharmacy Antibiotic Note  Allen Wheeler is a 88 y.o. male with recent admission for COVID 10/22-10/24 readmitted 10/25 with continued lethargy/hypoxia and concern for PNA. SCr 1.14 and improving. CrCl 31 ml/min. WBC 11.2 and afebrile. Pharmacy has been consulted to dose vancomycin. Vancomycin dose currently 500 mg q24 hours. Will increase dose based on pharmacokinetic parameters and improving renal function.   Plan: Increase Vancomycin to 1250 mg IV Q 24 hrs. Goal AUC 400-550. Expected AUC: 514.6 SCr used: 1.14  Continue to monitor renal function.  Height: '5\' 6"'$  (167.6 cm) Weight: 65.8 kg (145 lb 1 oz) IBW/kg (Calculated) : 63.8  Temp (24hrs), Avg:98.8 F (37.1 C), Min:98.6 F (37 C), Max:98.8 F (37.1 C)  Recent Labs  Lab 01/28/22 0804 01/28/22 0848 01/31/22 1822 01/31/22 2015 02/01/22 0525 02/01/22 0728 02/02/22 0946 02/02/22 1159 02/03/22 0111 02/03/22 1043 02/03/22 2158 02/04/22 0603  WBC 6.1   < > 9.8  --  12.6*  --  17.0*  --   --  15.7*  --  11.2*  CREATININE  --    < > 1.88*  --  1.50*  --  1.48*  --  1.35*  --   --  1.14  LATICACIDVEN 1.2  --  4.4* 3.7*  --  2.3*  --  1.8  --   --   --   --   VANCOPEAK  --   --   --   --   --   --   --   --   --   --  17*  --   VANCORANDOM  --   --   --   --   --   --   --   --   --   --   --  8   < > = values in this interval not displayed.    Estimated Creatinine Clearance: 31.1 mL/min (by C-G formula based on SCr of 1.14 mg/dL).    No Known Allergies  Antimicrobials this admission: Cefepime 10/25 >> 10/27 Vancomycin 10/25 >>   Microbiology results: 10/25 BCx: GPC clusters, MRSA 10/27 Bcx: GPC clusters 10/25 MRSA PCR: positive  Thank you for allowing pharmacy to be a part of this patient's care.  Jeneen Rinks, Pharm.D PGY1 Pharmacy Resident 02/04/2022 7:58 AM

## 2022-02-04 NOTE — Progress Notes (Signed)
PROGRESS NOTE                                                                                                                                                                                                             Patient Demographics:    Allen Wheeler, is a 88 y.o. male, DOB - 12-09-1919, WIO:[id number]  Outpatient Primary MD for the patient is Allen Morale, MD    LOS - 4  Admit date - 01/31/2022    Chief Complaint  Patient presents with   Code Sepsis       Brief Narrative (HPI from H&P)   88 y.o. M with PMH of GERD, depression/anxiety and discharged home yesterday after being hospitalized with Covid PNA.   He recently visited [place] to celebrate his 89th birthday and became ill after traveling home.  He was admitted 10/22-10/23 and treated with Molnupavir.  After discharge home, family states he initially seemed to be doing well, however slept most of the day today and was increasingly lethargic and high fever, in the ER he was found to have bacterial right lower lobe pneumonia, further work-up showed MRSA bacteremia, in the ER he also had a run of V. tach.  He was admitted by PCCM, in ICU he had Foley catheter placement by urology, he was stabilized and transferred to my care on 02/04/2022 on day 4 of hospital stay.   Subjective:    Allen Wheeler today has, No headache, No chest pain, No abdominal pain - No Nausea, No new weakness tingling or numbness,no SOB   Assessment  & Plan :   Severe sepsis, MRSA bacteremia after recent episode of COVID-pneumonia likely due to bacterial infection likely due to aspiration pneumonia caused at home. he rightly got weak and deconditioned due to recent COVID-19 infection and aspirated, developed bacterial pneumonia and MRSA bacteremia.  Has been seen by ID and PCCM, initially required BiPAP, now stable on 2 L nasal cannula oxygen, request speech to evaluate.  Antibiotics per ID.   Echocardiogram does not show any evidence of endocarditis.  Abscess pathophysiology has resolved.   Acute hypoxic respiratory failure due to pneumonia as above.  Much improved.  Initially required BiPAP now on nasal cannula, have speech evaluate, antibiotics for bacterial pneumonia, steroids for recent COVID infection.  Speech to evaluate to rule out ongoing aspiration.  Monitor closely.  Overall improving.  V.Tach in the ER.  Likely due to sepsis.  Resolved after amiodarone.  Avoiding beta-blocker or nodal agents due to underlying history of bradycardia and heart block.  Monitor electrolytes.  Junctional bradycardia with possible history of underlying second-degree AV block Mobitz type I.  Avoiding nodal agents.  Not a candidate for pacemaker due to bacteremia.  TSH is stable.  Continue to monitor on telemetry.  Blood pressure stable.  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  Gentle oral Lasix x1 on 02/04/2022, placed on low-dose hydralazine and Imdur if blood pressure permits, avoiding ACE/ARB due to recent AKI, no beta-blocker due to bradycardia and possible underlying second-degree AV block Mobitz type I.  Foley catheter trauma in ICU causing hematuria, underlying history of BPH.  Foley catheter in ICU was pulled due to patient's delirium, urology was consulted they have placed a Foley catheter on 02/03/2022.  Continue Flomax likely will be discharged home with Foley catheter with outpatient urology follow-up.  AKI.  Resolved.  Hypothyroidism.  On Synthroid continue at home dose, TSH stable.       Condition - Extremely Guarded  Family Communication  :    Son on Allen Wheeler [phone] 02/04/22  Code Status :  No CPR or intubation, discussed with son on 02/04/2022.  Consults  :    PUD Prophylaxis :  PPI   Procedures  :      TTE - 1. Left ventricular ejection fraction, by estimation, is 40 to 45%. The left ventricle has mildly decreased function. The  left ventricle demonstrates global hypokinesis. There is moderate asymmetric left ventricular hypertrophy of the septal segment. Left ventricular diastolic parameters are consistent with Grade I diastolic dysfunction (impaired relaxation).  2. Right ventricular systolic function is normal. The right ventricular size is normal.  3. Left atrial size was mildly dilated.  4. The mitral valve is normal in structure. Mild mitral valve regurgitation. No evidence of mitral stenosis.  5. The aortic valve is calcified. There is moderate calcification of the aortic valve. There is mild thickening of the aortic valve. Aortic valve regurgitation is not visualized. Aortic valve sclerosis/calcification is present, without any evidence of aortic stenosis. Aortic valve area, by VTI measures 2.00 cm. Aortic valve mean gradient measures 8.0 mmHg. Aortic valve Vmax measures 1.96 m/s.  6. The inferior vena cava is normal in size with greater than 50% respiratory variability, suggesting right atrial pressure of 3 mmHg.      Disposition Plan  :  Status is: Inpatient  DVT Prophylaxis  :    heparin injection 5,000 Units Start: 01/31/22 2200 SCDs Start: 01/31/22 2105   Lab Results  Component Value Date   PLT 210 02/04/2022    Diet :  Diet Order             Diet regular Room service appropriate? Yes with Assist; Fluid consistency: Thin  Diet effective now                    Inpatient Medications  Scheduled Meds:  Chlorhexidine Gluconate Cloth  6 each Topical Daily   citalopram  10 mg Oral q AM   dexamethasone (DECADRON) injection  6 mg Intravenous Q24H   heparin  5,000 Units Subcutaneous Q8H   levothyroxine  50 mcg Oral QAC breakfast   mupirocin ointment  1 Application Nasal BID   nystatin   Topical TID   pantoprazole  40 mg Oral Daily  tamsulosin  0.8 mg Oral QPC supper   Continuous Infusions:  vancomycin     PRN Meds:.acetaminophen, docusate sodium, guaiFENesin, ipratropium-albuterol,  LORazepam, polyethylene glycol    Objective:   Vitals:   02/03/22 1910 02/03/22 1932 02/04/22 0000 02/04/22 0315  BP:  (!) 146/76 133/74 114/66  Pulse: 61 70 (!) 40 (!) 45  Resp:  '20 18 18  '$ Temp:  98.8 F (37.1 C) 98.8 F (37.1 C) 98.7 F (37.1 C)  TempSrc:  Oral Oral Oral  SpO2: (S) 96% 98% 99% 98%  Weight:      Height:        Wt Readings from Last 3 Encounters:  02/03/22 65.8 kg  05/15/21 70.4 kg  05/12/21 72.1 kg     Intake/Output Summary (Last 24 hours) at 02/04/2022 0819 Last data filed at 02/03/2022 1400 Gross per 24 hour  Intake --  Output 300 ml  Net -300 ml     Physical Exam  Awake Alert, No new F.N deficits, Normal affect Marks.AT,PERRAL Supple Neck, No JVD,   Symmetrical Chest wall movement, few rales R base RRR,No Gallops,Rubs or new Murmurs,  +ve B.Sounds, Abd Soft, No tenderness,   No Cyanosis, Clubbing or edema     RN pressure injury documentation: Pressure Injury 02/01/22 Buttocks Right;Left Stage 1 -  Intact skin with non-blanchable redness of a localized area usually over a bony prominence. (Active)  02/01/22 0055  Location: Buttocks  Location Orientation: Right;Left  Staging: Stage 1 -  Intact skin with non-blanchable redness of a localized area usually over a bony prominence.  Wound Description (Comments):   Present on Admission:   Dressing Type Foam - Lift dressing to assess site every shift 02/03/22 2027      Data Review:    CBC Recent Labs  Lab 01/29/22 0322 01/30/22 0444 01/31/22 1822 01/31/22 1857 02/01/22 0525 02/02/22 0946 02/03/22 1043 02/04/22 0603  WBC 5.1 6.6 9.8  --  12.6* 17.0* 15.7* 11.2*  HGB 11.4* 11.5* 12.5* 11.9* 12.0* 11.6* 11.7* 10.9*  HCT 34.2* 33.4* 37.4* 35.0* 35.3* 34.1* 34.1* 31.0*  PLT 199 231 194  --  208 192 192 210  MCV 91.4 90.0 94.7  --  91.7 91.9 90.7 89.6  MCH 30.5 31.0 31.6  --  31.2 31.3 31.1 31.5  MCHC 33.3 34.4 33.4  --  34.0 34.0 34.3 35.2  RDW 14.1 14.1 14.3  --  14.3 14.1 14.2 13.9   LYMPHSABS 0.6* 0.8 2.1  --   --   --   --  0.4*  MONOABS 0.2 0.6 0.2  --   --   --   --  0.3  EOSABS 0.0 0.0 0.0  --   --   --   --  0.0  BASOSABS 0.0 0.0 0.0  --   --   --   --  0.0    Electrolytes Recent Labs  Lab 01/28/22 0848 01/28/22 2235 01/29/22 0322 01/29/22 1210 01/30/22 0444 01/31/22 1822 01/31/22 1857 01/31/22 2013 01/31/22 2015 02/01/22 0525 02/01/22 0728 02/02/22 0946 02/02/22 1159 02/03/22 0111 02/04/22 0603  NA  --   --  135  --  137 137 135  --   --  134*  --  134*  --  134* 134*  K  --   --  4.9  --  4.3 4.3 4.0  --   --  4.0  --  3.8  --  3.8 3.8  CL  --   --  108  --  108 103  --   --   --  103  --  105  --  103 103  CO2   < >  --  19*  --  20* 21*  --   --   --  19*  --  21*  --  19* 23  GLUCOSE  --   --  141*  --  136* 144*  --   --   --  152*  --  125*  --  128* 144*  BUN  --   --  27*  --  22 25*  --   --   --  23  --  28*  --  27* 23  CREATININE  --   --  1.31*  --  1.24 1.88*  --   --   --  1.50*  --  1.48*  --  1.35* 1.14  CALCIUM   < >  --  8.3*  --  8.7* 8.7*  --   --   --  8.0*  --  7.9*  --  7.9* 7.8*  AST  --   --  22  --  24 54*  --   --   --   --   --   --   --   --  19  ALT  --   --  18  --  18 29  --   --   --   --   --   --   --   --  27  ALKPHOS  --   --  54  --  51 73  --   --   --   --   --   --   --   --  48  BILITOT  --   --  1.1  --  0.9 1.5*  --   --   --   --   --   --   --   --  1.0  ALBUMIN  --   --  2.5*  --  2.6* 2.7*  --   --   --   --   --   --   --   --  2.0*  MG  --   --   --   --   --   --   --  2.0  --  2.7*  --  2.4  --  2.0 2.0  CRP  --   --   --   --   --   --   --  17.4*  --   --   --   --   --  30.4* 13.2*  DDIMER  --   --   --   --   --   --   --  16.33*  --   --   --   --   --   --   --   PROCALCITON  --   --   --  0.10  --   --   --  2.24  --   --   --   --   --  1.28  --   LATICACIDVEN  --   --   --   --   --  4.4*  --   --  3.7*  --  2.3*  --  1.8  --   --   INR  --   --   --   --   --  1.3*  --   --   --   --    --   --   --   --   --   TSH  --  1.265  --   --   --   --   --   --   --   --   --   --   --   --  0.543  HGBA1C  --   --   --   --   --  5.9*  --   --   --   --   --   --   --   --   --   BNP  --   --   --   --   --   --   --   --   --   --   --   --   --   --  1,035.7*   < > = values in this interval not displayed.    ------------------------------------------------------------------------------------------------------------------ No results for input(s): "CHOL", "HDL", "LDLCALC", "TRIG", "CHOLHDL", "LDLDIRECT" in the last 72 hours.  Lab Results  Component Value Date   HGBA1C 5.9 (H) 01/31/2022    Recent Labs    02/04/22 0603  TSH 0.543     Radiology Reports DG Chest Port 1 View  Result Date: 02/04/2022 CLINICAL DATA:  Shortness of breath EXAM: PORTABLE CHEST 1 VIEW COMPARISON:  January 31, 2022 FINDINGS: Opacity in the right lung has mildly worsened in the interval now extending from the base into the mid lung. Haziness over the right base could represent layering effusion. Infiltrate in the left base appears worsened as well. No other interval changes. IMPRESSION: 1. The right-sided infiltrate is worsened in the interval. The left-sided infiltrate is mildly worsened as well. Possible small layering right effusion. No other changes. Electronically Signed   By: Dorise Bullion III M.D.   On: 02/04/2022 07:50   ECHOCARDIOGRAM COMPLETE  Result Date: 02/01/2022    ECHOCARDIOGRAM REPORT   Patient Name:   AMONI MORALES Date of Exam: 02/01/2022 Medical Rec #:  [id number]        Height:       66.0 in Accession #:    [id number]       Weight:       144.6 lb Date of Birth:  July 12, 1919       BSA:          1.742 m Patient Age:    89 years        BP:           141/97 mmHg Patient Gender: M                HR:           44 bpm. Exam Location:  Inpatient Procedure: 2D Echo, Color Doppler and Cardiac Doppler Indications:    Sepsis  History:        Patient has no prior history of Echocardiogram  examinations.  Sonographer:    Memory Argue Referring Phys: [id number] [place]  1. Left ventricular ejection fraction, by estimation, is 40 to 45%. The left ventricle has mildly decreased function. The left ventricle demonstrates global hypokinesis. There is moderate asymmetric left ventricular hypertrophy of the septal segment. Left ventricular diastolic parameters are consistent with Grade I diastolic dysfunction (impaired relaxation).  2. Right ventricular systolic function is normal. The right ventricular size is normal.  3. Left atrial size was  mildly dilated.  4. The mitral valve is normal in structure. Mild mitral valve regurgitation. No evidence of mitral stenosis.  5. The aortic valve is calcified. There is moderate calcification of the aortic valve. There is mild thickening of the aortic valve. Aortic valve regurgitation is not visualized. Aortic valve sclerosis/calcification is present, without any evidence of aortic stenosis. Aortic valve area, by VTI measures 2.00 cm. Aortic valve mean gradient measures 8.0 mmHg. Aortic valve Vmax measures 1.96 m/s.  6. The inferior vena cava is normal in size with greater than 50% respiratory variability, suggesting right atrial pressure of 3 mmHg. Comparison(s): No prior Echocardiogram. FINDINGS  Left Ventricle: Left ventricular ejection fraction, by estimation, is 40 to 45%. The left ventricle has mildly decreased function. The left ventricle demonstrates global hypokinesis. The left ventricular internal cavity size was normal in size. There is  moderate asymmetric left ventricular hypertrophy of the septal segment. Left ventricular diastolic parameters are consistent with Grade I diastolic dysfunction (impaired relaxation). Right Ventricle: The right ventricular size is normal. No increase in right ventricular wall thickness. Right ventricular systolic function is normal. Left Atrium: Left atrial size was mildly dilated. Right Atrium: Right  atrial size was normal in size. Pericardium: There is no evidence of pericardial effusion. Mitral Valve: The mitral valve is normal in structure. Mild mitral valve regurgitation. No evidence of mitral valve stenosis. Tricuspid Valve: The tricuspid valve is normal in structure. Tricuspid valve regurgitation is mild . No evidence of tricuspid stenosis. Aortic Valve: The aortic valve is calcified. There is moderate calcification of the aortic valve. There is mild thickening of the aortic valve. Aortic valve regurgitation is not visualized. Aortic valve sclerosis/calcification is present, without any evidence of aortic stenosis. Aortic valve mean gradient measures 8.0 mmHg. Aortic valve peak gradient measures 15.4 mmHg. Aortic valve area, by VTI measures 2.00 cm. Pulmonic Valve: The pulmonic valve was normal in structure. Pulmonic valve regurgitation is not visualized. No evidence of pulmonic stenosis. Aorta: The aortic root is normal in size and structure. Venous: The inferior vena cava is normal in size with greater than 50% respiratory variability, suggesting right atrial pressure of 3 mmHg. IAS/Shunts: No atrial level shunt detected by color flow Doppler.  LEFT VENTRICLE PLAX 2D LVIDd:         3.50 cm      Diastology LVIDs:         2.80 cm      LV e' medial:    5.75 cm/s LV PW:         1.20 cm      LV E/e' medial:  9.3 LV IVS:        1.50 cm      LV e' lateral:   7.30 cm/s LVOT diam:     2.30 cm      LV E/e' lateral: 7.3 LV SV:         79 LV SV Index:   45 LVOT Area:     4.15 cm  LV Volumes (MOD) LV vol d, MOD A2C: 109.0 ml LV vol d, MOD A4C: 108.0 ml LV vol s, MOD A2C: 62.5 ml LV vol s, MOD A4C: 63.9 ml LV SV MOD A2C:     46.5 ml LV SV MOD A4C:     108.0 ml LV SV MOD BP:      43.7 ml RIGHT VENTRICLE TAPSE (M-mode): 1.7 cm LEFT ATRIUM             Index  RIGHT ATRIUM           Index LA diam:        4.60 cm 2.64 cm/m   RA Area:     11.20 cm LA Vol (A2C):   62.7 ml 35.98 ml/m  RA Volume:   22.30 ml  12.80  ml/m LA Vol (A4C):   68.2 ml 39.14 ml/m LA Biplane Vol: 67.1 ml 38.51 ml/m  AORTIC VALVE AV Area (Vmax):    1.80 cm AV Area (Vmean):   1.87 cm AV Area (VTI):     2.00 cm AV Vmax:           196.00 cm/s AV Vmean:          128.000 cm/s AV VTI:            0.395 m AV Peak Grad:      15.4 mmHg AV Mean Grad:      8.0 mmHg LVOT Vmax:         84.80 cm/s LVOT Vmean:        57.700 cm/s LVOT VTI:          0.190 m LVOT/AV VTI ratio: 0.48  AORTA Ao Root diam: 3.80 cm MITRAL VALVE               TRICUSPID VALVE MV Area (PHT): 2.69 cm    TR Peak grad:   14.6 mmHg MV Decel Time: 282 msec    TR Vmax:        191.00 cm/s MV E velocity: 53.60 cm/s MV A velocity: 91.40 cm/s  SHUNTS MV E/A ratio:  0.59        Systemic VTI:  0.19 m                            Systemic Diam: 2.30 cm Godfrey Pick Tobb DO Electronically signed by Berniece Salines DO Signature Date/Time: 02/01/2022/11:11:56 AM    Final    DG Chest Port 1 View  Result Date: 01/31/2022 CLINICAL DATA:  Questionable sepsis. Evaluate for abnormality. Patient was unresponsive. EXAM: PORTABLE CHEST 1 VIEW COMPARISON:  01/28/2022 FINDINGS: Shallow inspiration. Mild cardiac enlargement. Increasing basilar infiltrates in the lungs, more on the right, since prior study. Changes suggest developing pneumonia. Aspiration could also have this appearance. No pleural effusions. No pneumothorax. Mediastinal contours appear intact. Degenerative changes in the spine and shoulders. IMPRESSION: Cardiac enlargement. Increasing infiltrates in the lung bases, particularly on the right. Electronically Signed   By: Lucienne Capers M.D.   On: 01/31/2022 19:03      Signature  Lala Lund M.D on 02/04/2022 at 8:19 AM   -  To page go to [email or web address]

## 2022-02-04 NOTE — Evaluation (Addendum)
Clinical/Bedside Swallow Evaluation Patient Details  Name: Allen Wheeler MRN: [id number] Date of Birth: [birth year]  Today's Date: 02/04/2022 Time: SLP Start Time (ACUTE ONLY): 0848 SLP Stop Time (ACUTE ONLY): 0904 SLP Time Calculation (min) (ACUTE ONLY): 16 min  Past Medical History:  Past Medical History:  Diagnosis Date   Allergy    Anxiety    Chickenpox    Depression    Dizziness, nonspecific    loss of balance last few months   GERD (gastroesophageal reflux disease)    Hearing loss    wears hearing aids   Nephrolithiasis    Normal cardiac stress test 09-20-12   Osteoarthritis    Past Surgical History:  Past Surgical History:  Procedure Laterality Date   CATARACT EXTRACTION  2017   CHOLECYSTECTOMY N/A 02/18/2016   Procedure: LAPAROSCOPIC CHOLECYSTECTOMY WITH POSSIBLE INTRAOPERATIVE CHOLANGIOGRAM;  Surgeon: Allen Skinner, MD;  Location: [facility];  Service: General;  Laterality: N/A;   ERCP N/A 02/16/2016   Procedure: ENDOSCOPIC RETROGRADE CHOLANGIOPANCREATOGRAPHY (ERCP);  Surgeon: Allen Irani, MD;  Location: [facility] ENDOSCOPY;  Service: Endoscopy;  Laterality: N/A;   HERNIA REPAIR     wears truss   INGUINAL HERNIA REPAIR Right 06/21/2016   Procedure: OPEN RIGHT INGUINAL HERNIA REPAIR;  Surgeon: Allen Bruce Kinsinger, MD;  Location: [facility];  Service: General;  Laterality: Right;   INSERTION OF MESH Right 06/21/2016   Procedure: INSERTION OF MESH;  Surgeon: Allen Bruce Kinsinger, MD;  Location: [facility];  Service: General;  Laterality: Right;   MASTOIDECTOMY     HPI:  Pt is a 88 year old male with a recent diagnosis of COVID-pneumonia, treated, discharged home then reutrned the following day due to increased lethargy. CXR 10/29: right-sided infiltrate is worsened in the interval. The  left-sided infiltrate is mildly worsened as well. Pt with acute hypoxic respiratory failure, sever sepsis with acute metabolic encephalopahthy due to PNA. PMH: GERD, depression/anxiety     Assessment / Plan / Recommendation  Clinical Impression  Pt was seen for bedside swallow evaluation and he denied a history of dysphagia. No coughing was noted at baseline. Oral mechanism exam was [facility]; dentition was natural and reduced, but adequate for mastication. Pt exhibited coughing twice with a dual consistency boluses of mixed fruit with juice, but tolerated all other solids and liquids without signs or symptoms of oropharyngeal dysphagia. A regular texture diet with thin liquids is recommended at this time and SLP will follow briefly to ensure tolerance and to determine whether further assessment/intervention is clinically indicated. SLP Visit Diagnosis: Dysphagia, unspecified (R13.10)    Aspiration Risk  Mild aspiration risk    Diet Recommendation Regular;Thin liquid   Liquid Administration via: Cup;Straw Medication Administration: Whole meds with liquid Compensations: Slow rate Postural Changes: Seated upright at 90 degrees    Other  Recommendations Oral Care Recommendations: Oral care BID    Recommendations for follow up therapy are one component of a multi-disciplinary discharge planning process, led by the attending physician.  Recommendations may be updated based on patient status, additional functional criteria and insurance authorization.  Follow up Recommendations No SLP follow up      Assistance Recommended at Discharge    Functional Status Assessment Patient has had a recent decline in their functional status and demonstrates the ability to make significant improvements in function in a reasonable and predictable amount of time.  Frequency and Duration min 1 x/week  1 week       Prognosis Prognosis for Safe Diet Advancement: Good  Swallow Study   General Date of Onset: 02/03/22 HPI: Pt is a 88 year old male with a recent diagnosis of COVID-pneumonia, treated, discharged home then reutrned the following day due to increased lethargy. CXR 10/29: right-sided  infiltrate is worsened in the interval. The  left-sided infiltrate is mildly worsened as well. Pt with acute hypoxic respiratory failure, sever sepsis with acute metabolic encephalopahthy due to PNA. PMH: GERD, depression/anxiety Type of Study: Bedside Swallow Evaluation Previous Swallow Assessment: none Diet Prior to this Study: Regular;Thin liquids Temperature Spikes Noted: No Respiratory Status: Nasal cannula History of Recent Intubation: No Behavior/Cognition: Alert;Cooperative;Pleasant mood Oral Cavity Assessment: Within Functional Limits Oral Care Completed by SLP: No Oral Cavity - Dentition: Adequate natural dentition Vision: Functional for self-feeding Self-Feeding Abilities: Needs assist Patient Positioning: Upright in bed;Postural control adequate for testing Baseline Vocal Quality: Normal Volitional Cough: Strong Volitional Swallow: Able to elicit    Oral/Motor/Sensory Function Overall Oral Motor/Sensory Function: Within functional limits   Ice Chips Ice chips: Within functional limits Presentation: Spoon   Thin Liquid Thin Liquid: Within functional limits Presentation: Straw;Cup    Nectar Thick Nectar Thick Liquid: Not tested   Honey Thick Honey Thick Liquid: Not tested   Puree Puree: Within functional limits Presentation: Spoon   Solid     Solid: Impaired Presentation: Self Fed Pharyngeal Phase Impairments: Cough - Immediate     Allen Wheeler I. Allen Wheeler, [place], [place] Office number [phone]  Allen Wheeler 02/04/2022,9:07 AM

## 2022-02-04 NOTE — Progress Notes (Signed)
Subjective/Chief Complaint:  1 - Severe Catheter Trauma / Hematuria - new gross blood per penis after pt pulled out his catheter durin admission for pneumonia. No strong blood thinners. Prostate vol 50gm (NOT enlarged) by CT ellipsoid calculation from 2017. Required bedside cysto to navigate injured bulbar urethral and 43F catheter placed 10/28.    Today "Allen Wheeler" is stable. WBC trend down. Catheter remains in place.    Objective: Vital signs in last 24 hours: Temp:  [98.6 F (37 C)-98.8 F (37.1 C)] 98.7 F (37.1 C) (10/29 0315) Pulse Rate:  [40-70] 45 (10/29 0315) Resp:  [16-26] 18 (10/29 0315) BP: (108-150)/(50-113) 114/66 (10/29 0315) SpO2:  [94 %-99 %] 98 % (10/29 0315) Last BM Date : 02/02/22  Intake/Output from previous day: 10/28 0701 - 10/29 0700 In: -  Out: 300 [Urine:300] Intake/Output this shift: No intake/output data recorded.  Physical Exam Vitals reviewed.  Constitutional:      Comments: Frail, very hard of hearing. AOx 3.   HENT:     Nose: Nose normal.  Eyes:     Pupils: Pupils are equal, round, and reactive to light.  Cardiovascular:     Rate and Rhythm: Normal rate.  Pulmonary:     Effort: Pulmonary effort is normal.  Abdominal:     General: Abdomen is flat.  Genitourinary:    Comments: Large bore catheter in place to straight drain on light catheter strap traction. Some old dried blood at meatus (expected) no ongoing bleeding. Urine w/o gross blood. This is improved.  Musculoskeletal:        General: Normal range of motion.     Cervical back: Normal range of motion.  Skin:    General: Skin is warm.  Neurological:     General: No focal deficit present.  Psychiatric:        Mood and Affect: Mood normal.   Lab Results:  Recent Labs    02/03/22 1043 02/04/22 0603  WBC 15.7* 11.2*  HGB 11.7* 10.9*  HCT 34.1* 31.0*  PLT 192 210   BMET Recent Labs    02/03/22 0111 02/04/22 0603  NA 134* 134*  K 3.8 3.8  CL 103 103  CO2 19* 23   GLUCOSE 128* 144*  BUN 27* 23  CREATININE 1.35* 1.14  CALCIUM 7.9* 7.8*   PT/INR No results for input(s): "LABPROT", "INR" in the last 72 hours. ABG No results for input(s): "PHART", "HCO3" in the last 72 hours.  Invalid input(s): "PCO2", "PO2"  Studies/Results: No results found.  Anti-infectives: Anti-infectives (From admission, onward)    Start     Dose/Rate Route Frequency Ordered Stop   02/02/22 0700  vancomycin (VANCOREADY) IVPB 750 mg/150 mL  Status:  Discontinued        750 mg 150 mL/hr over 60 Minutes Intravenous Every 36 hours 01/31/22 2248 02/01/22 0832   02/02/22 0700  vancomycin (VANCOREADY) IVPB 500 mg/100 mL  Status:  Discontinued        500 mg 100 mL/hr over 60 Minutes Intravenous Every 24 hours 02/01/22 0832 02/01/22 0859   02/01/22 1800  ceFEPIme (MAXIPIME) 2 g in sodium chloride 0.9 % 100 mL IVPB  Status:  Discontinued        2 g 200 mL/hr over 30 Minutes Intravenous Every 24 hours 01/31/22 2248 02/01/22 1113   02/01/22 1800  vancomycin (VANCOREADY) IVPB 500 mg/100 mL        500 mg 100 mL/hr over 60 Minutes Intravenous Every 24 hours 02/01/22 0859  02/01/22 1800  ceFEPIme (MAXIPIME) 2 g in sodium chloride 0.9 % 100 mL IVPB  Status:  Discontinued        2 g 200 mL/hr over 30 Minutes Intravenous Every 24 hours 02/01/22 1142 02/02/22 1019   01/31/22 2045  remdesivir 100 mg in sodium chloride 0.9 % 100 mL IVPB  Status:  Discontinued        100 mg 200 mL/hr over 30 Minutes Intravenous Daily 01/31/22 2044 01/31/22 2105   01/31/22 1930  vancomycin (VANCOREADY) IVPB 500 mg/100 mL        500 mg 100 mL/hr over 60 Minutes Intravenous  Once 01/31/22 1918 01/31/22 2107   01/31/22 1915  vancomycin (VANCOREADY) IVPB 1500 mg/300 mL  Status:  Discontinued        1,500 mg 150 mL/hr over 120 Minutes Intravenous  Once 01/31/22 1912 01/31/22 1918   01/31/22 1830  ceFEPIme (MAXIPIME) 2 g in sodium chloride 0.9 % 100 mL IVPB        2 g 200 mL/hr over 30 Minutes  Intravenous  Once 01/31/22 1823 01/31/22 1915   01/31/22 1830  vancomycin (VANCOCIN) IVPB 1000 mg/200 mL premix  Status:  Discontinued        1,000 mg 200 mL/hr over 60 Minutes Intravenous  Once 01/31/22 1823 01/31/22 2004       Assessment/Plan:  Now temporized with large bore catheter which will tamponade urethral source bleeding / trauma and allow for urethral healing. Current catheter needs to stay at least 2 weeks. We will manage. This appears to be working well.    Strongly consider mittens if pt winds up confused and tugging on it again and/or placing pants on the patient with drawtring with catheter tubing going down the leg so he cannot get to the catheter directly.  Fortunately this has not been problematic.     Alexis Frock 02/04/2022

## 2022-02-04 NOTE — TOC Initial Note (Signed)
Transition of Care [facility]) - Initial/Assessment Note    Patient Details  Name: Allen Wheeler MRN: [id number] Date of Birth: [birth year]  Transition of Care [facility]) CM/SW Contact:    Carles Collet, RN Phone Number: 02/04/2022, 1:39 PM  Clinical Narrative:                  Chart reviewed. Patient readmitted. Set up with [facility] from previous DC. Notified [facility] of readmission. TOC will continue to follow.  Expected Discharge Plan: [place] Barriers to Discharge: Continued Medical Work up   Patient Goals and CMS Choice        Expected Discharge Plan and Services Expected Discharge Plan: [place]                                              Prior Living Arrangements/Services                       Activities of Daily Living      Permission Sought/Granted                  Emotional Assessment              Admission diagnosis:  Respiratory failure [facility]) [J96.90] Patient Active Problem List   Diagnosis Date Noted   Hypoxia    MRSA bacteremia 02/01/2022   Pressure injury of skin 02/01/2022   Respiratory failure ([place]) 01/31/2022   Encephalopathy acute    AKI (acute kidney injury) ([place])    Pneumonia due to COVID-19 virus 01/28/2022   Renal impairment 01/28/2022   Genitourinary infection, candidal 01/28/2022   Neuropathy 04/29/2020   Nocturnal leg cramps 04/29/2020   IBS (irritable bowel syndrome) 01/06/2020   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   Lumbar pain 01/06/2020   Multiple environmental allergies 12/28/2016   Restless legs syndrome 12/28/2016   Inguinal hernia 06/21/2016   Right inguinal hernia 04/19/2016   Left hand weakness    Sepsis ([place]) 02/15/2016   Cholelithiasis with acute cholangitis with biliary obstruction 02/14/2016   Hypothyroidism 12/13/2015   Eczema 09/16/2014   Abnormal ECG 09/03/2012   SOMNOLENCE 10/31/2009   BPH with urinary obstruction 02/16/2009   Pruritic disorder  02/16/2009   WEAKNESS 02/16/2009   ELEVATED BLOOD PRESSURE 02/16/2009   Anxiety 05/27/2008   Depression 05/27/2008   GERD 05/27/2008   Osteoarthritis 05/27/2008   NEPHROLITHIASIS, HX OF 05/27/2008   PCP:  [name], MD Pharmacy:   [facility] [id number] - [address], [place] [address] Phone: [phone] Fax: [phone] PHARMACY [id number] - [address] [place] - [place] [address] Phone: [phone] Fax: [phone]  [place], [place] [address] Phone: [phone] Fax: [phone]     Social Determinants of Health (SDOH) Interventions    Readmission Risk Interventions     No data to display

## 2022-02-05 ENCOUNTER — Inpatient Hospital Stay: Payer: Medicare Other | Admitting: Family Medicine

## 2022-02-05 DIAGNOSIS — R7881 Bacteremia: Secondary | ICD-10-CM | POA: Diagnosis not present

## 2022-02-05 DIAGNOSIS — U071 COVID-19: Secondary | ICD-10-CM | POA: Diagnosis not present

## 2022-02-05 DIAGNOSIS — B9562 Methicillin resistant Staphylococcus aureus infection as the cause of diseases classified elsewhere: Secondary | ICD-10-CM | POA: Diagnosis not present

## 2022-02-05 DIAGNOSIS — J1282 Pneumonia due to coronavirus disease 2019: Secondary | ICD-10-CM | POA: Diagnosis not present

## 2022-02-05 LAB — CBC WITH DIFFERENTIAL/PLATELET
Abs Immature Granulocytes: 0.16 10*3/uL — ABNORMAL HIGH (ref 0.00–0.07)
Basophils Absolute: 0 10*3/uL (ref 0.0–0.1)
Basophils Relative: 0 %
Eosinophils Absolute: 0 10*3/uL (ref 0.0–0.5)
Eosinophils Relative: 0 %
HCT: 31.2 % — ABNORMAL LOW (ref 39.0–52.0)
Hemoglobin: 10.7 g/dL — ABNORMAL LOW (ref 13.0–17.0)
Immature Granulocytes: 1 %
Lymphocytes Relative: 4 %
Lymphs Abs: 0.5 10*3/uL — ABNORMAL LOW (ref 0.7–4.0)
MCH: 31 pg (ref 26.0–34.0)
MCHC: 34.3 g/dL (ref 30.0–36.0)
MCV: 90.4 fL (ref 80.0–100.0)
Monocytes Absolute: 0.5 10*3/uL (ref 0.1–1.0)
Monocytes Relative: 4 %
Neutro Abs: 10.2 10*3/uL — ABNORMAL HIGH (ref 1.7–7.7)
Neutrophils Relative %: 91 %
Platelets: 199 10*3/uL (ref 150–400)
RBC: 3.45 MIL/uL — ABNORMAL LOW (ref 4.22–5.81)
RDW: 14.1 % (ref 11.5–15.5)
WBC: 11.3 10*3/uL — ABNORMAL HIGH (ref 4.0–10.5)
nRBC: 0 % (ref 0.0–0.2)

## 2022-02-05 LAB — COMPREHENSIVE METABOLIC PANEL
ALT: 24 U/L (ref 0–44)
AST: 18 U/L (ref 15–41)
Albumin: 1.9 g/dL — ABNORMAL LOW (ref 3.5–5.0)
Alkaline Phosphatase: 47 U/L (ref 38–126)
Anion gap: 8 (ref 5–15)
BUN: 25 mg/dL — ABNORMAL HIGH (ref 8–23)
CO2: 23 mmol/L (ref 22–32)
Calcium: 7.7 mg/dL — ABNORMAL LOW (ref 8.9–10.3)
Chloride: 103 mmol/L (ref 98–111)
Creatinine, Ser: 1.29 mg/dL — ABNORMAL HIGH (ref 0.61–1.24)
GFR, Estimated: 49 mL/min — ABNORMAL LOW (ref >60)
Glucose, Bld: 148 mg/dL — ABNORMAL HIGH (ref 70–99)
Potassium: 4.3 mmol/L (ref 3.5–5.1)
Sodium: 134 mmol/L — ABNORMAL LOW (ref 135–145)
Total Bilirubin: 0.9 mg/dL (ref 0.3–1.2)
Total Protein: 5.4 g/dL — ABNORMAL LOW (ref 6.5–8.1)

## 2022-02-05 LAB — PROCALCITONIN: Procalcitonin: 0.42 ng/mL

## 2022-02-05 LAB — BRAIN NATRIURETIC PEPTIDE: B Natriuretic Peptide: 809.5 pg/mL — ABNORMAL HIGH (ref 0.0–100.0)

## 2022-02-05 LAB — GLUCOSE, CAPILLARY: Glucose-Capillary: 94 mg/dL (ref 70–99)

## 2022-02-05 LAB — C-REACTIVE PROTEIN: CRP: 10.3 mg/dL — ABNORMAL HIGH (ref <1.0)

## 2022-02-05 LAB — CULTURE, BLOOD (ROUTINE X 2): Special Requests: ADEQUATE

## 2022-02-05 LAB — MAGNESIUM: Magnesium: 1.9 mg/dL (ref 1.7–2.4)

## 2022-02-05 NOTE — Progress Notes (Signed)
Occupational Therapy Treatment Patient Details Name: MCCRAE SPECIALE MRN: [id number] DOB: 10/04/1919 Today's Date: 02/05/2022   History of present illness Patient is 88 year old male with a recent diagnosis of COVID-pneumonia, treated, discharged home then reutrned the following day due to increased lethargy. Pt in junctional bradycardia, with intermittent runs of VT and  was hypoxic and placed on nonrebreather; Pt with acute hypoxic rspoiratory failure, sever sepsis wiht acute metabolic encephalopahthy due to PNA.   OT comments  Patient received in supine and on 5 liters of O2.  Patient received in supine and agreeable to OT session. Patient required increased time and min assist to get to EOB and min assist for transfer to recliner. Patient provided with handout for energy conservation and reviewed with patient. Patient demonstrated good understanding of energy conservation strategies. SpO2 ranged from 78-92 with activities and required cues for pursed lip breathing. Acute OT to continue to follow.    Recommendations for follow up therapy are one component of a multi-disciplinary discharge planning process, led by the attending physician.  Recommendations may be updated based on patient status, additional functional criteria and insurance authorization.    Follow Up Recommendations  Home health OT    Assistance Recommended at Discharge Frequent or constant Supervision/Assistance  Patient can return home with the following  A little help with walking and/or transfers;A little help with bathing/dressing/bathroom;Assistance with cooking/housework;Direct supervision/assist for medications management;Direct supervision/assist for financial management;Assist for transportation;Help with stairs or ramp for entrance   Equipment Recommendations  Tub/shower seat    Recommendations for Other Services      Precautions / Restrictions Precautions Precautions: Fall Precaution Comments: Monitor O2,  covid+ Restrictions Weight Bearing Restrictions: No       Mobility Bed Mobility Overal bed mobility: Needs Assistance Bed Mobility: Supine to Sit     Supine to sit: Min assist     General bed mobility comments: min assist to scoot forward    Transfers Overall transfer level: Needs assistance Equipment used: 1 person hand held assist Transfers: Sit to/from Stand, Bed to chair/wheelchair/BSC Sit to Stand: Min assist     Step pivot transfers: Min assist     General transfer comment: Increased time     Balance Overall balance assessment: Needs assistance Sitting-balance support: Feet supported Sitting balance-Leahy Scale: Good     Standing balance support: Single extremity supported, During functional activity Standing balance-Leahy Scale: Fair Standing balance comment: stood for transfer                           ADL either performed or assessed with clinical judgement   ADL Overall ADL's : Needs assistance/impaired     Grooming: Min guard;Sitting Grooming Details (indicate cue type and reason): on EOB                 Toilet Transfer: Minimal assistance Toilet Transfer Details (indicate cue type and reason): simulated with HHA                Extremity/Trunk Assessment              Vision       Perception     Praxis      Cognition Arousal/Alertness: Awake/alert Behavior During Therapy: WFL for tasks assessed/performed Overall Cognitive Status: Difficult to assess  General Comments: difficulty to assist due to [facility]. Followed directions when understood        Exercises      Shoulder Instructions       General Comments on 5 liters of O2 with SpO2 78-92 with activity. Required verbal cues for pursed lip breathing    Pertinent Vitals/ Pain       Pain Assessment Pain Assessment: Faces Faces Pain Scale: No hurt Pain Intervention(s): Monitored during session  Home Living                                           Prior Functioning/Environment              Frequency  Min 2X/week        Progress Toward Goals  OT Goals(current goals can now be found in the care plan section)  Progress towards OT goals: Progressing toward goals  Acute Rehab OT Goals Patient Stated Goal: get better OT Goal Formulation: With patient Time For Goal Achievement: 02/16/22 Potential to Achieve Goals: Good ADL Goals Pt Will Perform Grooming: with modified independence;standing Pt Will Perform Lower Body Bathing: with supervision;sit to/from stand Pt Will Perform Upper Body Dressing: with modified independence Pt Will Perform Lower Body Dressing: with set-up;with supervision;sit to/from stand Pt Will Transfer to Toilet: with supervision;ambulating Pt Will Perform Toileting - Clothing Manipulation and hygiene: with supervision;sitting/lateral leans Additional ADL Goal #1: Pt will demonstrate 3 energy conservation strategies during ADL tasks  Plan Discharge plan remains appropriate    Co-evaluation                 AM-PAC OT "6 Clicks" Daily Activity     Outcome Measure   Help from another person eating meals?: A Little Help from another person taking care of personal grooming?: A Little Help from another person toileting, which includes using toliet, bedpan, or urinal?: A Lot Help from another person bathing (including washing, rinsing, drying)?: A Lot Help from another person to put on and taking off regular upper body clothing?: A Little Help from another person to put on and taking off regular lower body clothing?: A Lot 6 Click Score: 15    End of Session Equipment Utilized During Treatment: Oxygen (5 liters)  OT Visit Diagnosis: Unsteadiness on feet (R26.81);Muscle weakness (generalized) (M62.81);Other symptoms and signs involving cognitive function   Activity Tolerance Patient tolerated treatment well   Patient Left in chair;with  call bell/phone within reach   Nurse Communication Mobility status        Time: 4196-2229 OT Time Calculation (min): 31 min  Charges: OT General Charges $OT Visit: 1 Visit OT Treatments $Therapeutic Activity: 23-37 mins  Lodema Hong, [place]  Office [place] 02/05/2022, 1:55 PM

## 2022-02-05 NOTE — Progress Notes (Signed)
[facility] for Infectious Disease  Date of Admission:  01/31/2022           Reason for visit: Follow up on MRSA bacteremia  Current antibiotics: Vancomycin    ASSESSMENT:    88 y.o. male admitted with:  MRSA bacteremia: Patient admitted with MRSA bacteremia in the setting of recent COVID pneumonia.  Repeat blood cultures drawn 10/27 remain positive for Staph aureus in both sets of cultures.  Repeat cultures have been drawn today and are pending.  Of note, patient did have negative blood cultures on 10/22 during his [facility] admission that were negative prior to being readmitted with bacteremia on 10/25 thus making endocarditis less likely and TTE was negative.  Suspect bacteremia likely secondary to postviral pneumonia. Recent COVID-19 infection Acute hypoxic respiratory failure: Due to #1 and #2.  This has improved.  He was initially requiring BiPAP in the ICU and now is on nasal cannula at approximately 5 L and saturating 98 to 100% over the last 24 hours of documented vital signs.  RECOMMENDATIONS:    Continue vancomycin Monitor repeat cultures Will follow   Principal Problem:   MRSA bacteremia Active Problems:   Pneumonia due to COVID-19 virus   Respiratory failure (HCC)   Pressure injury of skin   Hypoxia    MEDICATIONS:    Scheduled Meds:  Chlorhexidine Gluconate Cloth  6 each Topical Daily   citalopram  10 mg Oral q AM   dexamethasone (DECADRON) injection  6 mg Intravenous Q24H   heparin  5,000 Units Subcutaneous Q8H   levothyroxine  50 mcg Oral QAC breakfast   nystatin   Topical TID   pantoprazole  40 mg Oral Daily   tamsulosin  0.8 mg Oral QPC supper   Continuous Infusions:  vancomycin 1,250 mg (02/04/22 1749)   PRN Meds:.acetaminophen, docusate sodium, guaiFENesin, ipratropium-albuterol, LORazepam, polyethylene glycol  SUBJECTIVE:   24 hour events:  No acute events  No new complaints  Review of Systems  All other systems reviewed and  are negative.     OBJECTIVE:   Blood pressure (!) 142/87, pulse 62, temperature 97.8 F (36.6 C), temperature source Oral, resp. rate 15, height '5\' 6"'$  (1.676 m), weight 65.8 kg, SpO2 98 %. Body mass index is 23.41 kg/m.  Physical Exam Constitutional:      Appearance: Normal appearance.  Eyes:     Extraocular Movements: Extraocular movements intact.     Conjunctiva/sclera: Conjunctivae normal.  Musculoskeletal:     Cervical back: Normal range of motion and neck supple.  Skin:    General: Skin is warm and dry.  Neurological:     General: No focal deficit present.     Mental Status: He is alert. Mental status is at baseline.      Lab Results: Lab Results  Component Value Date   WBC 11.3 (H) 02/05/2022   HGB 10.7 (L) 02/05/2022   HCT 31.2 (L) 02/05/2022   MCV 90.4 02/05/2022   PLT 199 02/05/2022    Lab Results  Component Value Date   NA 134 (L) 02/05/2022   K 4.3 02/05/2022   CO2 23 02/05/2022   GLUCOSE 148 (H) 02/05/2022   BUN 25 (H) 02/05/2022   CREATININE 1.29 (H) 02/05/2022   CALCIUM 7.7 (L) 02/05/2022   GFRNONAA 49 (L) 02/05/2022   GFRAA 54 (L) 07/16/2016    Lab Results  Component Value Date   ALT 24 02/05/2022   AST 18 02/05/2022   ALKPHOS 47 02/05/2022  BILITOT 0.9 02/05/2022       Component Value Date/Time   CRP 10.3 (H) 02/05/2022 0334    No results found for: "ESRSEDRATE"   I have reviewed the micro and lab results in Epic.  Imaging: DG Chest Port 1 View  Result Date: 02/04/2022 CLINICAL DATA:  Shortness of breath EXAM: PORTABLE CHEST 1 VIEW COMPARISON:  January 31, 2022 FINDINGS: Opacity in the right lung has mildly worsened in the interval now extending from the base into the mid lung. Haziness over the right base could represent layering effusion. Infiltrate in the left base appears worsened as well. No other interval changes. IMPRESSION: 1. The right-sided infiltrate is worsened in the interval. The left-sided infiltrate is mildly  worsened as well. Possible small layering right effusion. No other changes. Electronically Signed   By: Dorise Bullion III M.D.   On: 02/04/2022 07:50     Imaging independently reviewed in Epic.    Raynelle Highland for Infectious Disease [facility] [phone] pager 02/05/2022, 12:59 PM

## 2022-02-05 NOTE — Progress Notes (Signed)
PROGRESS NOTE                                                                                                                                                                                                             Patient Demographics:    Allen Wheeler, is a 88 y.o. male, DOB - 1919-07-15, XIH:[id number]  Outpatient Primary MD for the patient is [name], MD    LOS - 5  Admit date - 01/31/2022    Chief Complaint  Patient presents with   Code Sepsis       Brief Narrative (HPI from H&P)   88 y.o. M with PMH of GERD, depression/anxiety and discharged home yesterday after being hospitalized with Covid PNA.   He recently visited [place] to celebrate his 89th birthday and became ill after traveling home.  He was admitted 10/22-10/23 and treated with Molnupavir.  After discharge home, family states he initially seemed to be doing well, however slept most of the day today and was increasingly lethargic and high fever, in the ER he was found to have bacterial right lower lobe pneumonia, further work-up showed MRSA bacteremia, in the ER he also had a run of V. tach.  He was admitted by PCCM, in ICU he had Foley catheter placement by urology, he was stabilized and transferred to my care on 02/04/2022 on day 4 of hospital stay.   Subjective:   Patient in bed, appears comfortable, denies any headache, no fever, no chest pain or pressure, no shortness of breath , no abdominal pain. No new focal weakness.    Assessment  & Plan :   Severe sepsis, MRSA bacteremia after recent episode of COVID-pneumonia likely due to bacterial infection likely due to aspiration pneumonia caused at home. he rightly got weak and deconditioned due to recent COVID-19 infection and aspirated, developed bacterial pneumonia and MRSA bacteremia.  Has been seen by ID and PCCM, initially required BiPAP, now stable on 2 L nasal cannula oxygen, request speech to  evaluate.  Antibiotics per ID.  Echocardiogram does not show any evidence of endocarditis.  Abscess pathophysiology has resolved.   Acute hypoxic respiratory failure due to pneumonia as above.  Much improved.  Initially required BiPAP now on nasal cannula, have speech evaluate, antibiotics for bacterial pneumonia, steroids for recent COVID infection.  Speech to evaluate to  rule out ongoing aspiration.  Monitor closely.  Overall improving.  V.Tach in the ER.  Likely due to sepsis.  Resolved after amiodarone.  Avoiding beta-blocker or nodal agents due to underlying history of bradycardia and heart block.  Monitor electrolytes.  Junctional bradycardia with possible history of underlying second-degree AV block Mobitz type I.  Avoiding nodal agents.  Not a candidate for pacemaker due to bacteremia.  TSH is stable.  Continue to monitor on telemetry.  Blood pressure stable.  Per family request will get cardiology input.  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  Gentle oral Lasix x1 on 02/04/2022, placed on low-dose hydralazine and Imdur if blood pressure permits, avoiding ACE/ARB due to recent AKI, no beta-blocker due to bradycardia and possible underlying second-degree AV block Mobitz type I.  Foley catheter trauma in ICU causing hematuria, underlying history of BPH.  Foley catheter in ICU was pulled due to patient's delirium, urology was consulted they have placed a Foley catheter on 02/03/2022.  Continue Flomax likely will be discharged home with Foley catheter with outpatient urology follow-up.  AKI.  Resolved.  Hypothyroidism.  On Synthroid continue at home dose, TSH stable.       Condition - Extremely Guarded  Family Communication  :    Son on [name] [phone] 02/04/22  Code Status :  No CPR or intubation, discussed with son on 02/04/2022.  Consults  : PCCM, cardiology, urology  PUD Prophylaxis :  PPI   Procedures  :      TTE - 1. Left ventricular  ejection fraction, by estimation, is 40 to 45%. The left ventricle has mildly decreased function. The left ventricle demonstrates global hypokinesis. There is moderate asymmetric left ventricular hypertrophy of the septal segment. Left ventricular diastolic parameters are consistent with Grade I diastolic dysfunction (impaired relaxation).  2. Right ventricular systolic function is normal. The right ventricular size is normal.  3. Left atrial size was mildly dilated.  4. The mitral valve is normal in structure. Mild mitral valve regurgitation. No evidence of mitral stenosis.  5. The aortic valve is calcified. There is moderate calcification of the aortic valve. There is mild thickening of the aortic valve. Aortic valve regurgitation is not visualized. Aortic valve sclerosis/calcification is present, without any evidence of aortic stenosis. Aortic valve area, by VTI measures 2.00 cm. Aortic valve mean gradient measures 8.0 mmHg. Aortic valve Vmax measures 1.96 m/s.  6. The inferior vena cava is normal in size with greater than 50% respiratory variability, suggesting right atrial pressure of 3 mmHg.      Disposition Plan  :  Status is: Inpatient  DVT Prophylaxis  :    heparin injection 5,000 Units Start: 01/31/22 2200 SCDs Start: 01/31/22 2105   Lab Results  Component Value Date   PLT 199 02/05/2022    Diet :  Diet Order             Diet regular Room service appropriate? Yes with Assist; Fluid consistency: Thin  Diet effective now                    Inpatient Medications  Scheduled Meds:  Chlorhexidine Gluconate Cloth  6 each Topical Daily   citalopram  10 mg Oral q AM   dexamethasone (DECADRON) injection  6 mg Intravenous Q24H   heparin  5,000 Units Subcutaneous Q8H   levothyroxine  50 mcg Oral QAC breakfast   mupirocin ointment  1 Application Nasal BID   nystatin  Topical TID   pantoprazole  40 mg Oral Daily   tamsulosin  0.8 mg Oral QPC supper   Continuous Infusions:   vancomycin 1,250 mg (02/04/22 1749)   PRN Meds:.acetaminophen, docusate sodium, guaiFENesin, ipratropium-albuterol, LORazepam, polyethylene glycol    Objective:   Vitals:   02/04/22 1700 02/04/22 2007 02/04/22 2334 02/05/22 0334  BP: 135/74 131/69 (!) 147/78 (!) 145/66  Pulse: (!) 53 65 (!) 42 (!) 47  Resp: '18 19 18 18  '$ Temp:  97.9 F (36.6 C) 98 F (36.7 C) 98.1 F (36.7 C)  TempSrc: Oral Oral Oral Oral  SpO2:   100% 95%  Weight:      Height:        Wt Readings from Last 3 Encounters:  02/03/22 65.8 kg  05/15/21 70.4 kg  05/12/21 72.1 kg     Intake/Output Summary (Last 24 hours) at 02/05/2022 0844 Last data filed at 02/05/2022 0500 Gross per 24 hour  Intake 120 ml  Output 2550 ml  Net -2430 ml     Physical Exam  Awake Alert, No new F.N deficits, Normal affect [place].AT,PERRAL Supple Neck, No JVD,   Symmetrical Chest wall movement, Good air movement bilaterally, CTAB RRR,No Gallops, Rubs or new Murmurs,  +ve B.Sounds, Abd Soft, No tenderness,   No Cyanosis, Clubbing or edema     RN pressure injury documentation: Pressure Injury 02/01/22 Buttocks Right;Left Stage 1 -  Intact skin with non-blanchable redness of a localized area usually over a bony prominence. (Active)  02/01/22 0055  Location: Buttocks  Location Orientation: Right;Left  Staging: Stage 1 -  Intact skin with non-blanchable redness of a localized area usually over a bony prominence.  Wound Description (Comments):   Present on Admission:   Dressing Type Foam - Lift dressing to assess site every shift 02/04/22 2000      Data Review:    CBC Recent Labs  Lab 01/30/22 0444 01/31/22 1822 01/31/22 1857 02/01/22 0525 02/02/22 0946 02/03/22 1043 02/04/22 0603 02/05/22 0334  WBC 6.6 9.8  --  12.6* 17.0* 15.7* 11.2* 11.3*  HGB 11.5* 12.5*   < > 12.0* 11.6* 11.7* 10.9* 10.7*  HCT 33.4* 37.4*   < > 35.3* 34.1* 34.1* 31.0* 31.2*  PLT 231 194  --  208 192 192 210 199  MCV 90.0 94.7  --  91.7 91.9  90.7 89.6 90.4  MCH 31.0 31.6  --  31.2 31.3 31.1 31.5 31.0  MCHC 34.4 33.4  --  34.0 34.0 34.3 35.2 34.3  RDW 14.1 14.3  --  14.3 14.1 14.2 13.9 14.1  LYMPHSABS 0.8 2.1  --   --   --   --  0.4* 0.5*  MONOABS 0.6 0.2  --   --   --   --  0.3 0.5  EOSABS 0.0 0.0  --   --   --   --  0.0 0.0  BASOSABS 0.0 0.0  --   --   --   --  0.0 0.0   < > = values in this interval not displayed.    Electrolytes Recent Labs  Lab 01/29/22 1210 01/30/22 0444 01/30/22 0444 01/31/22 1822 01/31/22 1857 01/31/22 2013 01/31/22 2015 02/01/22 0525 02/01/22 0728 02/02/22 0946 02/02/22 1159 02/03/22 0111 02/04/22 0603 02/05/22 0334  NA  --  137   < > 137   < >  --   --  134*  --  134*  --  134* 134* 134*  K  --  4.3   < >  4.3   < >  --   --  4.0  --  3.8  --  3.8 3.8 4.3  CL  --  108   < > 103  --   --   --  103  --  105  --  103 103 103  CO2  --  20*   < > 21*  --   --   --  19*  --  21*  --  19* 23 23  GLUCOSE  --  136*   < > 144*  --   --   --  152*  --  125*  --  128* 144* 148*  BUN  --  22   < > 25*  --   --   --  23  --  28*  --  27* 23 25*  CREATININE  --  1.24   < > 1.88*  --   --   --  1.50*  --  1.48*  --  1.35* 1.14 1.29*  CALCIUM  --  8.7*   < > 8.7*  --   --   --  8.0*  --  7.9*  --  7.9* 7.8* 7.7*  AST  --  24  --  54*  --   --   --   --   --   --   --   --  19 18  ALT  --  18  --  29  --   --   --   --   --   --   --   --  27 24  ALKPHOS  --  51  --  73  --   --   --   --   --   --   --   --  48 47  BILITOT  --  0.9  --  1.5*  --   --   --   --   --   --   --   --  1.0 0.9  ALBUMIN  --  2.6*  --  2.7*  --   --   --   --   --   --   --   --  2.0* 1.9*  MG  --   --   --   --    < > 2.0  --  2.7*  --  2.4  --  2.0 2.0 1.9  CRP  --   --   --   --   --  17.4*  --   --   --   --   --  30.4* 13.2* 10.3*  DDIMER  --   --   --   --   --  16.33*  --   --   --   --   --   --   --   --   PROCALCITON 0.10  --   --   --   --  2.24  --   --   --   --   --  1.28 0.49 0.42  LATICACIDVEN  --   --   --   4.4*  --   --  3.7*  --  2.3*  --  1.8  --   --   --   INR  --   --   --  1.3*  --   --   --   --   --   --   --   --   --   --  TSH  --   --   --   --   --   --   --   --   --   --   --   --  0.543  --   HGBA1C  --   --   --  5.9*  --   --   --   --   --   --   --   --   --   --   BNP  --   --   --   --   --   --   --   --   --   --   --   --  1,035.7* 809.5*   < > = values in this interval not displayed.    ------------------------------------------------------------------------------------------------------------------ No results for input(s): "CHOL", "HDL", "LDLCALC", "TRIG", "CHOLHDL", "LDLDIRECT" in the last 72 hours.  Lab Results  Component Value Date   HGBA1C 5.9 (H) 01/31/2022    Recent Labs    02/04/22 0603  TSH 0.543     Radiology Reports DG Chest Port 1 View  Result Date: 02/04/2022 CLINICAL DATA:  Shortness of breath EXAM: PORTABLE CHEST 1 VIEW COMPARISON:  January 31, 2022 FINDINGS: Opacity in the right lung has mildly worsened in the interval now extending from the base into the mid lung. Haziness over the right base could represent layering effusion. Infiltrate in the left base appears worsened as well. No other interval changes. IMPRESSION: 1. The right-sided infiltrate is worsened in the interval. The left-sided infiltrate is mildly worsened as well. Possible small layering right effusion. No other changes. Electronically Signed   By: Dorise Bullion III M.D.   On: 02/04/2022 07:50   ECHOCARDIOGRAM COMPLETE  Result Date: 02/01/2022    ECHOCARDIOGRAM REPORT   Patient Name:   Allen Wheeler Date of Exam: 02/01/2022 Medical Rec #:  [id number]        Height:       66.0 in Accession #:    [id number]       Weight:       144.6 lb Date of Birth:  [birth year]       BSA:          1.742 m Patient Age:    89 years        BP:           141/97 mmHg Patient Gender: M                HR:           44 bpm. Exam Location:  Inpatient Procedure: 2D Echo, Color Doppler and Cardiac  Doppler Indications:    Sepsis  History:        Patient has no prior history of Echocardiogram examinations.  Sonographer:    Memory Argue Referring Phys: [id number] [place]  1. Left ventricular ejection fraction, by estimation, is 40 to 45%. The left ventricle has mildly decreased function. The left ventricle demonstrates global hypokinesis. There is moderate asymmetric left ventricular hypertrophy of the septal segment. Left ventricular diastolic parameters are consistent with Grade I diastolic dysfunction (impaired relaxation).  2. Right ventricular systolic function is normal. The right ventricular size is normal.  3. Left atrial size was mildly dilated.  4. The mitral valve is normal in structure. Mild mitral valve regurgitation. No evidence of mitral stenosis.  5. The aortic valve is calcified. There is moderate calcification of the aortic valve. There is mild thickening  of the aortic valve. Aortic valve regurgitation is not visualized. Aortic valve sclerosis/calcification is present, without any evidence of aortic stenosis. Aortic valve area, by VTI measures 2.00 cm. Aortic valve mean gradient measures 8.0 mmHg. Aortic valve Vmax measures 1.96 m/s.  6. The inferior vena cava is normal in size with greater than 50% respiratory variability, suggesting right atrial pressure of 3 mmHg. Comparison(s): No prior Echocardiogram. FINDINGS  Left Ventricle: Left ventricular ejection fraction, by estimation, is 40 to 45%. The left ventricle has mildly decreased function. The left ventricle demonstrates global hypokinesis. The left ventricular internal cavity size was normal in size. There is  moderate asymmetric left ventricular hypertrophy of the septal segment. Left ventricular diastolic parameters are consistent with Grade I diastolic dysfunction (impaired relaxation). Right Ventricle: The right ventricular size is normal. No increase in right ventricular wall thickness. Right ventricular systolic  function is normal. Left Atrium: Left atrial size was mildly dilated. Right Atrium: Right atrial size was normal in size. Pericardium: There is no evidence of pericardial effusion. Mitral Valve: The mitral valve is normal in structure. Mild mitral valve regurgitation. No evidence of mitral valve stenosis. Tricuspid Valve: The tricuspid valve is normal in structure. Tricuspid valve regurgitation is mild . No evidence of tricuspid stenosis. Aortic Valve: The aortic valve is calcified. There is moderate calcification of the aortic valve. There is mild thickening of the aortic valve. Aortic valve regurgitation is not visualized. Aortic valve sclerosis/calcification is present, without any evidence of aortic stenosis. Aortic valve mean gradient measures 8.0 mmHg. Aortic valve peak gradient measures 15.4 mmHg. Aortic valve area, by VTI measures 2.00 cm. Pulmonic Valve: The pulmonic valve was normal in structure. Pulmonic valve regurgitation is not visualized. No evidence of pulmonic stenosis. Aorta: The aortic root is normal in size and structure. Venous: The inferior vena cava is normal in size with greater than 50% respiratory variability, suggesting right atrial pressure of 3 mmHg. IAS/Shunts: No atrial level shunt detected by color flow Doppler.  LEFT VENTRICLE PLAX 2D LVIDd:         3.50 cm      Diastology LVIDs:         2.80 cm      LV e' medial:    5.75 cm/s LV PW:         1.20 cm      LV E/e' medial:  9.3 LV IVS:        1.50 cm      LV e' lateral:   7.30 cm/s LVOT diam:     2.30 cm      LV E/e' lateral: 7.3 LV SV:         79 LV SV Index:   45 LVOT Area:     4.15 cm  LV Volumes (MOD) LV vol d, MOD A2C: 109.0 ml LV vol d, MOD A4C: 108.0 ml LV vol s, MOD A2C: 62.5 ml LV vol s, MOD A4C: 63.9 ml LV SV MOD A2C:     46.5 ml LV SV MOD A4C:     108.0 ml LV SV MOD BP:      43.7 ml RIGHT VENTRICLE TAPSE (M-mode): 1.7 cm LEFT ATRIUM             Index        RIGHT ATRIUM           Index LA diam:        4.60 cm 2.64 cm/m    RA Area:  11.20 cm LA Vol (A2C):   62.7 ml 35.98 ml/m  RA Volume:   22.30 ml  12.80 ml/m LA Vol (A4C):   68.2 ml 39.14 ml/m LA Biplane Vol: 67.1 ml 38.51 ml/m  AORTIC VALVE AV Area (Vmax):    1.80 cm AV Area (Vmean):   1.87 cm AV Area (VTI):     2.00 cm AV Vmax:           196.00 cm/s AV Vmean:          128.000 cm/s AV VTI:            0.395 m AV Peak Grad:      15.4 mmHg AV Mean Grad:      8.0 mmHg LVOT Vmax:         84.80 cm/s LVOT Vmean:        57.700 cm/s LVOT VTI:          0.190 m LVOT/AV VTI ratio: 0.48  AORTA Ao Root diam: 3.80 cm MITRAL VALVE               TRICUSPID VALVE MV Area (PHT): 2.69 cm    TR Peak grad:   14.6 mmHg MV Decel Time: 282 msec    TR Vmax:        191.00 cm/s MV E velocity: 53.60 cm/s MV A velocity: 91.40 cm/s  SHUNTS MV E/A ratio:  0.59        Systemic VTI:  0.19 m                            Systemic Diam: 2.30 cm Kardie Tobb DO Electronically signed by Berniece Salines DO Signature Date/Time: 02/01/2022/11:11:56 AM    Final       Signature  Lala Lund M.D on 02/05/2022 at 8:44 AM   -  To page go to [email or web address]

## 2022-02-05 NOTE — Progress Notes (Addendum)
Physical Therapy Treatment Patient Details Name: Allen Wheeler MRN: [id number] DOB: Aug 16, 1919 Today's Date: 02/05/2022   History of Present Illness Patient is 88 year old male with a recent diagnosis of COVID-pneumonia, treated, discharged home then reutrned the following day due to increased lethargy. Pt in junctional bradycardia, with intermittent runs of VT and  was hypoxic and placed on nonrebreather; Pt with acute hypoxic rspoiratory failure, sever sepsis wiht acute metabolic encephalopahthy due to PNA.    PT Comments    Pt continues to tolerate limited activity and needs frequent extended rest breaks. Has only done standing and transfers to chair/bsc. Hasn't really ambulated yet. He lives alone but when asked said he had multiple people who could assist him at home. I expect it will take some time for his activity tolerance to return close to baseline and expect he will need help at home for daily activities as well as to increase his mobility. If pt's family unable to provide this may need to consider SNF.   Recommendations for follow up therapy are one component of a multi-disciplinary discharge planning process, led by the attending physician.  Recommendations may be updated based on patient status, additional functional criteria and insurance authorization.  Follow Up Recommendations  Home health PT (If patient has assist with mobility and ADL's at home)     Assistance Recommended at Discharge Frequent or constant Supervision/Assistance  Patient can return home with the following A little help with walking and/or transfers;A little help with bathing/dressing/bathroom;Assistance with cooking/housework;Assist for transportation;Help with stairs or ramp for entrance   Equipment Recommendations  None recommended by PT    Recommendations for Other Services       Precautions / Restrictions Precautions Precautions: Fall Precaution Comments: Monitor O2,  covid+ Restrictions Weight Bearing Restrictions: No     Mobility  Bed Mobility Overal bed mobility: Needs Assistance Bed Mobility: Supine to Sit, Sit to Supine     Supine to sit: Min assist Sit to supine: Min assist   General bed mobility comments: Assist to elevate trunk into sitting. Assist to bring legs back up into bed    Transfers Overall transfer level: Needs assistance Equipment used: 1 person hand held assist Transfers: Sit to/from Stand, Bed to chair/wheelchair/BSC Sit to Stand: Min assist   Step pivot transfers: Min assist       General transfer comment: Assist to bring hips up and for balance. Bed to bsc to bed    Ambulation/Gait             Pre-gait activities: Side stepped up to HOB x 2 feet with min assist     Stairs             Wheelchair Mobility    Modified Rankin (Stroke Patients Only)       Balance Overall balance assessment: Needs assistance Sitting-balance support: Feet supported Sitting balance-Leahy Scale: Good     Standing balance support: Single extremity supported, During functional activity Standing balance-Leahy Scale: Fair Standing balance comment: stood for transfer                            Cognition Arousal/Alertness: Awake/alert Behavior During Therapy: WFL for tasks assessed/performed Overall Cognitive Status: Within Functional Limits for tasks assessed                                 General Comments: tangential but likely at  baseline        Exercises      General Comments General comments (skin integrity, edema, etc.): pt on 5L of O2. SpO2 monitor not getting a reading throughout treatment. Pt requires frequent rest breaks and tripods to decrease dyspnea      Pertinent Vitals/Pain Pain Assessment Pain Assessment: Faces Faces Pain Scale: Hurts little more Pain Location: chronic back Pain Descriptors / Indicators: Grimacing, Guarding Pain Intervention(s): Monitored  during session, Repositioned, Patient requesting pain meds-RN notified    Home Living                          Prior Function            PT Goals (current goals can now be found in the care plan section) Progress towards PT goals: Not progressing toward goals - comment    Frequency    Min 3X/week      PT Plan Current plan remains appropriate    Co-evaluation              AM-PAC PT "6 Clicks" Mobility   Outcome Measure  Help needed turning from your back to your side while in a flat bed without using bedrails?: A Little Help needed moving from lying on your back to sitting on the side of a flat bed without using bedrails?: A Little Help needed moving to and from a bed to a chair (including a wheelchair)?: A Little Help needed standing up from a chair using your arms (e.g., wheelchair or bedside chair)?: A Little Help needed to walk in hospital room?: Total Help needed climbing 3-5 steps with a railing? : Total 6 Click Score: 14    End of Session Equipment Utilized During Treatment: Oxygen Activity Tolerance: Patient limited by fatigue;Other (comment) (Dyspnea) Patient left: in bed;with call bell/phone within reach;with bed alarm set Nurse Communication: Mobility status;Patient requests pain meds PT Visit Diagnosis: Unsteadiness on feet (R26.81);Other abnormalities of gait and mobility (R26.89);Muscle weakness (generalized) (M62.81);Difficulty in walking, not elsewhere classified (R26.2)     Time: 6213-0865 PT Time Calculation (min) (ACUTE ONLY): 46 min  Charges:  $Therapeutic Activity: 38-52 mins                     [place] 02/05/2022, 6:44 PM

## 2022-02-05 NOTE — Consult Note (Addendum)
Cardiology Consultation   Patient ID: Allen Wheeler MRN: [id number]; DOB: [birth year]  Admit date: 01/31/2022 Date of Consult: 02/05/2022  PCP:  [name], MD   [place] Providers Cardiologist:  None        Patient Profile:   Allen Wheeler is a 88 y.o. male with a hx of GERD, anxiety, depression, normal cardiac stress test (09/20/2012), osteoarthritis who is being seen 02/05/2022 for the evaluation of bradycardia at the request of Dr. Candiss Norse.  History of Present Illness:   Allen Wheeler presented to the hospital on 10/22 after falling ill following a trip to [place] to celebrate his 89th birthday.  Patient was hospitalized for management of COVID-pneumonia from 10/22 to 10/23 and was treated with Molnupavir.  Upon discharge, patient became increasingly lethargic and developed a high fever, prompting family to bring patient back to the emergency department.  In the ED for this current admission, patient was found with bacterial right lower lobe pneumonia and blood cultures positive for MRSA.  Patient was with altered mental status and hypoxia requiring high flow O2 and BiPAP.  Patient also with a run of torsades while in the ED per ECG tracings, managed with magnesium and amiodarone.  Patient initially admitted by pulmonary critical care medicine with labs notable for lactic acidosis of 4.4, procalcitonin 2.24, fever to 104 on Tylenol. Now notably improved with normal lactic acid. Creatinine also improved.   No known cardiac history. Patient laying in bed eating lunch on my exam. He is extremely hard of hearing and ROS is limited by this fact.    Past Medical History:  Diagnosis Date   Allergy    Anxiety    Chickenpox    Depression    Dizziness, nonspecific    loss of balance last few months   GERD (gastroesophageal reflux disease)    Hearing loss    wears hearing aids   Nephrolithiasis    Normal cardiac stress test 09-20-12   Osteoarthritis     Past  Surgical History:  Procedure Laterality Date   CATARACT EXTRACTION  2017   CHOLECYSTECTOMY N/A 02/18/2016   Procedure: LAPAROSCOPIC CHOLECYSTECTOMY WITH POSSIBLE INTRAOPERATIVE CHOLANGIOGRAM;  Surgeon: Mickeal Skinner, MD;  Location: [place];  Service: General;  Laterality: N/A;   ERCP N/A 02/16/2016   Procedure: ENDOSCOPIC RETROGRADE CHOLANGIOPANCREATOGRAPHY (ERCP);  Surgeon: Teena Irani, MD;  Location: [facility] ENDOSCOPY;  Service: Endoscopy;  Laterality: N/A;   HERNIA REPAIR     wears truss   INGUINAL HERNIA REPAIR Right 06/21/2016   Procedure: OPEN RIGHT INGUINAL HERNIA REPAIR;  Surgeon: [name] Kinsinger, MD;  Location: [facility];  Service: General;  Laterality: Right;   INSERTION OF MESH Right 06/21/2016   Procedure: INSERTION OF MESH;  Surgeon: [name] Kinsinger, MD;  Location: [facility];  Service: General;  Laterality: Right;   MASTOIDECTOMY       Home Medications:  Prior to Admission medications   Medication Sig Start Date End Date Taking? Authorizing Provider  citalopram (CELEXA) 10 MG tablet TAKE ONE TABLET ONCE DAILY Patient taking differently: Take 10 mg by mouth in the morning. 09/22/21   [name], MD  diphenoxylate-atropine (LOMOTIL) 2.5-0.025 MG tablet TAKE ONE OR TWO TABLETS BY MOUTH EVERY MORNING Patient taking differently: Take 1-2 tablets by mouth daily as needed for diarrhea or loose stools. 10/27/21   [name], MD  gabapentin (NEURONTIN) 100 MG capsule Take 2 capsules (200 mg total) by mouth at bedtime. 08/22/21  [name], MD  hydrOXYzine (VISTARIL) 25 MG capsule TAKE ONE CAPSULE BY MOUTH EVERY 6 HOURS AS NEEDED FOR ITCHING Patient taking differently: Take 25 mg by mouth every 6 (six) hours as needed for itching. 09/27/21   [name], MD  levothyroxine (SYNTHROID) 50 MCG tablet TAKE 1 TABLET ONCE DAILY. Patient taking differently: Take 50 mcg by mouth daily before breakfast. 01/10/21   [name], MD  LORazepam (ATIVAN) 1 MG tablet Take 1 tablet (1 mg  total) by mouth at bedtime as needed. Patient taking differently: Take 1 mg by mouth at bedtime as needed for anxiety or sleep. 09/26/21   [name], MD  Multiple Vitamins-Minerals (CENTRUM SILVER ADULT 50+) TABS Take 1 tablet by mouth in the morning.    [provider]  nystatin (MYCOSTATIN/NYSTOP) powder Apply topically 3 (three) times daily. 01/30/22   Shawna Clamp, MD  omeprazole (PRILOSEC) 40 MG capsule TAKE 1 CAPSULE DAILY. Patient taking differently: Take 40 mg by mouth in the morning. 01/10/21   [name], MD  predniSONE (DELTASONE) 10 MG tablet TAKE 1 TABLET ONCE DAILY. Patient taking differently: Take 10 mg by mouth in the morning. 04/12/21   [name], MD  tamsulosin (FLOMAX) 0.4 MG CAPS capsule TAKE TWO CAPSULES BY MOUTH DAILY Patient taking differently: Take 0.8 mg by mouth at bedtime. 03/07/21   [name], MD    Inpatient Medications: Scheduled Meds:  Chlorhexidine Gluconate Cloth  6 each Topical Daily   citalopram  10 mg Oral q AM   dexamethasone (DECADRON) injection  6 mg Intravenous Q24H   heparin  5,000 Units Subcutaneous Q8H   levothyroxine  50 mcg Oral QAC breakfast   nystatin   Topical TID   pantoprazole  40 mg Oral Daily   tamsulosin  0.8 mg Oral QPC supper   Continuous Infusions:  vancomycin 1,250 mg (02/04/22 1749)   PRN Meds: acetaminophen, docusate sodium, guaiFENesin, ipratropium-albuterol, LORazepam, polyethylene glycol  Allergies:   No Known Allergies  Social History:   Social History   Socioeconomic History   Marital status: Widowed    Spouse name: Not on file   Number of children: 1   Years of education: Not on file   Highest education level: Not on file  Occupational History   Occupation: Retired    Fish farm manager: [employer]  Tobacco Use   Smoking status: Former   Smokeless tobacco: Never  Substance and Sexual Activity   Alcohol use: Yes    Alcohol/week: 18.0 standard drinks of alcohol    Types: 14 Shots of  liquor, 4 Glasses of wine per week   Drug use: No   Sexual activity: Not on file  Other Topics Concern   Not on file  Social History Narrative   Lives at home alone, widowed   Right-handed   Caffeine: rare   Social Determinants of Health   Financial Resource Strain: Low Risk  (05/12/2021)   Overall Financial Resource Strain (CARDIA)    Difficulty of Paying Living Expenses: Not hard at all  Food Insecurity: No Food Insecurity (01/28/2022)   Hunger Vital Sign    Worried About Running Out of Food in the Last Year: Never true    Ran Out of Food in the Last Year: Never true  Transportation Needs: No Transportation Needs (01/28/2022)   PRAPARE - Hydrologist (Medical): No    Lack of Transportation (Non-Medical): No  Physical Activity: Inactive (05/12/2021)  Exercise Vital Sign    Days of Exercise per Week: 0 days    Minutes of Exercise per Session: 0 min  Stress: No Stress Concern Present (05/12/2021)   [place]    Feeling of Stress : Not at all  Social Connections: Moderately Integrated (05/12/2021)   Social Connection and Isolation Panel [NHANES]    Frequency of Communication with Friends and Family: More than three times a week    Frequency of Social Gatherings with Friends and Family: More than three times a week    Attends Religious Services: 1 to 4 times per year    Active Member of [name] or Organizations: Yes    Attends Archivist Meetings: 1 to 4 times per year    Marital Status: Widowed  Intimate Partner Violence: Not At Risk (01/28/2022)   Humiliation, Afraid, Rape, and Kick questionnaire    Fear of Current or Ex-Partner: No    Emotionally Abused: No    Physically Abused: No    Sexually Abused: No    Family History:    Family History  Problem Relation Age of Onset   Heart disease Mother        Pacemaker   Colon cancer Father      ROS:  Please see the history of  present illness.   All other ROS reviewed and negative.     Physical Exam/Data:   Vitals:   02/04/22 2007 02/04/22 2334 02/05/22 0334 02/05/22 0800  BP: 131/69 (!) 147/78 (!) 145/66 (!) 142/87  Pulse: 65 (!) 42 (!) 47 62  Resp: '19 18 18 15  '$ Temp: 97.9 F (36.6 C) 98 F (36.7 C) 98.1 F (36.7 C) 97.8 F (36.6 C)  TempSrc: Oral Oral Oral Oral  SpO2:  100% 95% 98%  Weight:      Height:        Intake/Output Summary (Last 24 hours) at 02/05/2022 1057 Last data filed at 02/05/2022 0500 Gross per 24 hour  Intake --  Output 2550 ml  Net -2550 ml      02/03/2022    5:00 AM 02/01/2022    4:35 AM 01/31/2022   10:40 PM  Last 3 Weights  Weight (lbs) 145 lb 1 oz 144 lb 10 oz 144 lb 10 oz  Weight (kg) 65.8 kg 65.6 kg 65.6 kg     Body mass index is 23.41 kg/m.  General:  Well nourished, well developed, in no acute distress HEENT: normal Neck: no JVD Vascular: No carotid bruits; Distal pulses 2+ bilaterally Cardiac:  normal S1, S2; slightly irregular; no murmur  Lungs:  right lower lobe with inspiratory rhonchi. Left lung clear to auscultation. Abd: soft, nontender, no hepatomegaly  Ext: no edema Musculoskeletal:  No deformities, BUE and BLE strength normal and equal Skin: warm and dry  Neuro:  CNs 2-12 intact, no focal abnormalities noted Psych:  Normal affect   EKG:  No new tracing today Telemetry:  Telemetry was personally reviewed and demonstrates:  bradycardia. 2nd degree AV block type I vs type II with 2:1 conduction.  Relevant CV Studies:  02/01/2022 TTE  IMPRESSIONS     1. Left ventricular ejection fraction, by estimation, is 40 to 45%. The  left ventricle has mildly decreased function. The left ventricle  demonstrates global hypokinesis. There is moderate asymmetric left  ventricular hypertrophy of the septal segment.  Left ventricular diastolic parameters are consistent with Grade I  diastolic dysfunction (impaired relaxation).   2.  Right ventricular  systolic function is normal. The right ventricular  size is normal.   3. Left atrial size was mildly dilated.   4. The mitral valve is normal in structure. Mild mitral valve  regurgitation. No evidence of mitral stenosis.   5. The aortic valve is calcified. There is moderate calcification of the  aortic valve. There is mild thickening of the aortic valve. Aortic valve  regurgitation is not visualized. Aortic valve sclerosis/calcification is  present, without any evidence of  aortic stenosis. Aortic valve area, by VTI measures 2.00 cm. Aortic valve  mean gradient measures 8.0 mmHg. Aortic valve Vmax measures 1.96 m/s.   6. The inferior vena cava is normal in size with greater than 50%  respiratory variability, suggesting right atrial pressure of 3 mmHg.   Comparison(s): No prior Echocardiogram.   FINDINGS   Left Ventricle: Left ventricular ejection fraction, by estimation, is 40  to 45%. The left ventricle has mildly decreased function. The left  ventricle demonstrates global hypokinesis. The left ventricular internal  cavity size was normal in size. There is   moderate asymmetric left ventricular hypertrophy of the septal segment.  Left ventricular diastolic parameters are consistent with Grade I  diastolic dysfunction (impaired relaxation).   Right Ventricle: The right ventricular size is normal. No increase in  right ventricular wall thickness. Right ventricular systolic function is  normal.   Left Atrium: Left atrial size was mildly dilated.   Right Atrium: Right atrial size was normal in size.   Pericardium: There is no evidence of pericardial effusion.   Mitral Valve: The mitral valve is normal in structure. Mild mitral valve  regurgitation. No evidence of mitral valve stenosis.   Tricuspid Valve: The tricuspid valve is normal in structure. Tricuspid  valve regurgitation is mild . No evidence of tricuspid stenosis.   Aortic Valve: The aortic valve is calcified. There is  moderate  calcification of the aortic valve. There is mild thickening of the aortic  valve. Aortic valve regurgitation is not visualized. Aortic valve  sclerosis/calcification is present, without any  evidence of aortic stenosis. Aortic valve mean gradient measures 8.0 mmHg.  Aortic valve peak gradient measures 15.4 mmHg. Aortic valve area, by VTI  measures 2.00 cm.   Pulmonic Valve: The pulmonic valve was normal in structure. Pulmonic valve  regurgitation is not visualized. No evidence of pulmonic stenosis.   Aorta: The aortic root is normal in size and structure.   Venous: The inferior vena cava is normal in size with greater than 50%  respiratory variability, suggesting right atrial pressure of 3 mmHg.   IAS/Shunts: No atrial level shunt detected by color flow Doppler.   Laboratory Data:  High Sensitivity Troponin:   Recent Labs  Lab 01/28/22 0804  TROPONINIHS 33*     Chemistry Recent Labs  Lab 02/03/22 0111 02/04/22 0603 02/05/22 0334  NA 134* 134* 134*  K 3.8 3.8 4.3  CL 103 103 103  CO2 19* 23 23  GLUCOSE 128* 144* 148*  BUN 27* 23 25*  CREATININE 1.35* 1.14 1.29*  CALCIUM 7.9* 7.8* 7.7*  MG 2.0 2.0 1.9  GFRNONAA 47* 57* 49*  ANIONGAP '12 8 8    '$ Recent Labs  Lab 01/31/22 1822 02/04/22 0603 02/05/22 0334  PROT 6.7 5.6* 5.4*  ALBUMIN 2.7* 2.0* 1.9*  AST 54* 19 18  ALT '29 27 24  '$ ALKPHOS 73 48 47  BILITOT 1.5* 1.0 0.9   Lipids No results for input(s): "CHOL", "TRIG", "HDL", "LABVLDL", "LDLCALC", "  CHOLHDL" in the last 168 hours.  Hematology Recent Labs  Lab 02/03/22 1043 02/04/22 0603 02/05/22 0334  WBC 15.7* 11.2* 11.3*  RBC 3.76* 3.46* 3.45*  HGB 11.7* 10.9* 10.7*  HCT 34.1* 31.0* 31.2*  MCV 90.7 89.6 90.4  MCH 31.1 31.5 31.0  MCHC 34.3 35.2 34.3  RDW 14.2 13.9 14.1  PLT 192 210 199   Thyroid  Recent Labs  Lab 02/04/22 0603  TSH 0.543  FREET4 0.87    BNP Recent Labs  Lab 02/04/22 0603 02/05/22 0334  BNP 1,035.7* 809.5*     DDimer  Recent Labs  Lab 01/31/22 2013  DDIMER 16.33*     Radiology/Studies:  DG Chest Port 1 View  Result Date: 02/04/2022 CLINICAL DATA:  Shortness of breath EXAM: PORTABLE CHEST 1 VIEW COMPARISON:  January 31, 2022 FINDINGS: Opacity in the right lung has mildly worsened in the interval now extending from the base into the mid lung. Haziness over the right base could represent layering effusion. Infiltrate in the left base appears worsened as well. No other interval changes. IMPRESSION: 1. The right-sided infiltrate is worsened in the interval. The left-sided infiltrate is mildly worsened as well. Possible small layering right effusion. No other changes. Electronically Signed   By: Dorise Bullion III M.D.   On: 02/04/2022 07:50     Assessment and Plan:   Bradycardia AV block on ECG Torsades  Initial ECG on 10/25 concerning for AV block with 2:1 conduction.  Left bundle branch block also noted, previously seen on ECGs as far back as 2017.  On the evening of 10/25 while in the ED, patient with Torsades per ECG. Sinus rhythm restored following administration of magnesium and Amiodarone.   Telemetry review today shows ongoing bradycardia with AV block second-degree type I versus second-degree type II with 2:1 conduction Suspect that patient is experiencing acute exacerbation of underlying conduction disease (LBBB noted back at least 5 years) in the setting of severe sepsis/bacteremia. Unfortunately, patient is not a candidate for a pacemaker with bacteremia.  Continue avoiding nodal blocking agents. Maintain K>4, Mg >2.   Severe sepsis MRSA bacteremia with likely bacterial pneumonia  Patient with lactic acidosis (4.4) on admission, now improved to 1.8 following fluid resuscitation and antibiotics.  Initial blood cultures positive for MRSA.  Remains on vancomycin.  Management per primary team.  Acute hypoxic respiratory failure COVID pneumonia  Patient initially requiring  BiPAP/high flow O2, now with reduced oxygen requirements.  Recently COVID-positive (10/22).  Receiving steroids.  Management per primary team.   Acute HFmrEF  Patient with TTE showing LVEF 40-45% and global hypokinesis. No evidence of acute ischemic etiology. Some of this reduced EF may be dyssynchrony associated due to known LBBB. Would recommend cautious diuresis as needed for elevated volume status, though patient is euvolemic without elevated JVP or peripheral edema on my exam. Consider repeat outpatient TTE upon recovery from sepsis. BNP 1035.7 -> 809.5.   For questions or updates, please contact [place] Please consult [email or web address] for contact info under    Signed, Lily Kocher, PA-C  02/05/2022 10:57 AM  Patient seen and examined earlier   Agree with findings as noted by E Williams above Pt is a 88 yo who is recovering from COVID PNA   Discharged 01/29/22   Readmitted on 01/31/22  with T 104, resp distress.  He was minimally responsive at that time   Was bradycardic  QT interval was not that prolonged.   He then developed PMVT  Started  on IV Vanco and Cefepine as well as amiodarone The pt continued to have some bradycardia with 2:1 block  Amiodarone has been stopped   Currently the pt is sleeping comfortably    Lungs iwht mild rhonchi Cardiac RRR     No murmurs  Abd is supple  Ext are without edema   Feet warm   PMVT   Pt with VT, PMVT on admit  Initial EKG, the QT interval was not severely long.  K was OK on admit   He was on molnipavir and hypoxic    Currently in SB    Comfortable    I would follow Keep electrolytes repleted.     2  Bradycardia  Patient maintaining good BPs with lower heart rates   Tele:  SB, 2:1 AV block    BP maintained with this  Follow on tele   3   LBBB  Old  4   HFmrEF    Volume overall does not appear too bad  Cr took sl bump   Follow for now   Dorris Carnes MD

## 2022-02-06 ENCOUNTER — Inpatient Hospital Stay: Payer: Medicare Other | Admitting: Family Medicine

## 2022-02-06 ENCOUNTER — Inpatient Hospital Stay (HOSPITAL_COMMUNITY): Payer: Medicare Other

## 2022-02-06 DIAGNOSIS — U071 COVID-19: Secondary | ICD-10-CM | POA: Diagnosis not present

## 2022-02-06 DIAGNOSIS — B9562 Methicillin resistant Staphylococcus aureus infection as the cause of diseases classified elsewhere: Secondary | ICD-10-CM | POA: Diagnosis not present

## 2022-02-06 DIAGNOSIS — R7881 Bacteremia: Secondary | ICD-10-CM | POA: Diagnosis not present

## 2022-02-06 DIAGNOSIS — J9601 Acute respiratory failure with hypoxia: Secondary | ICD-10-CM | POA: Diagnosis not present

## 2022-02-06 LAB — CBC WITH DIFFERENTIAL/PLATELET
Abs Immature Granulocytes: 0.23 10*3/uL — ABNORMAL HIGH (ref 0.00–0.07)
Basophils Absolute: 0 10*3/uL (ref 0.0–0.1)
Basophils Relative: 0 %
Eosinophils Absolute: 0 10*3/uL (ref 0.0–0.5)
Eosinophils Relative: 0 %
HCT: 34.9 % — ABNORMAL LOW (ref 39.0–52.0)
Hemoglobin: 11.9 g/dL — ABNORMAL LOW (ref 13.0–17.0)
Immature Granulocytes: 2 %
Lymphocytes Relative: 6 %
Lymphs Abs: 0.6 10*3/uL — ABNORMAL LOW (ref 0.7–4.0)
MCH: 31 pg (ref 26.0–34.0)
MCHC: 34.1 g/dL (ref 30.0–36.0)
MCV: 90.9 fL (ref 80.0–100.0)
Monocytes Absolute: 0.4 10*3/uL (ref 0.1–1.0)
Monocytes Relative: 4 %
Neutro Abs: 9.8 10*3/uL — ABNORMAL HIGH (ref 1.7–7.7)
Neutrophils Relative %: 88 %
Platelets: 203 10*3/uL (ref 150–400)
RBC: 3.84 MIL/uL — ABNORMAL LOW (ref 4.22–5.81)
RDW: 14 % (ref 11.5–15.5)
WBC: 11.1 10*3/uL — ABNORMAL HIGH (ref 4.0–10.5)
nRBC: 0 % (ref 0.0–0.2)

## 2022-02-06 LAB — COMPREHENSIVE METABOLIC PANEL
ALT: 22 U/L (ref 0–44)
AST: 15 U/L (ref 15–41)
Albumin: 2 g/dL — ABNORMAL LOW (ref 3.5–5.0)
Alkaline Phosphatase: 48 U/L (ref 38–126)
Anion gap: 10 (ref 5–15)
BUN: 23 mg/dL (ref 8–23)
CO2: 22 mmol/L (ref 22–32)
Calcium: 8.1 mg/dL — ABNORMAL LOW (ref 8.9–10.3)
Chloride: 102 mmol/L (ref 98–111)
Creatinine, Ser: 1.12 mg/dL (ref 0.61–1.24)
GFR, Estimated: 59 mL/min — ABNORMAL LOW (ref >60)
Glucose, Bld: 129 mg/dL — ABNORMAL HIGH (ref 70–99)
Potassium: 4.2 mmol/L (ref 3.5–5.1)
Sodium: 134 mmol/L — ABNORMAL LOW (ref 135–145)
Total Bilirubin: 1 mg/dL (ref 0.3–1.2)
Total Protein: 5.6 g/dL — ABNORMAL LOW (ref 6.5–8.1)

## 2022-02-06 LAB — PROCALCITONIN: Procalcitonin: 0.24 ng/mL

## 2022-02-06 LAB — C-REACTIVE PROTEIN: CRP: 11 mg/dL — ABNORMAL HIGH (ref <1.0)

## 2022-02-06 LAB — T3: T3, Total: 55 ng/dL — ABNORMAL LOW (ref 71–180)

## 2022-02-06 LAB — MAGNESIUM: Magnesium: 1.9 mg/dL (ref 1.7–2.4)

## 2022-02-06 LAB — BRAIN NATRIURETIC PEPTIDE: B Natriuretic Peptide: 731.9 pg/mL — ABNORMAL HIGH (ref 0.0–100.0)

## 2022-02-06 MED ORDER — SODIUM CHLORIDE 0.9 % IV SOLN
400.0000 mg | Freq: Three times a day (TID) | INTRAVENOUS | Status: DC
  Administered 2022-02-06 – 2022-02-12 (×18): 400 mg via INTRAVENOUS
  Filled 2022-02-06 (×20): qty 20

## 2022-02-06 MED ORDER — MORPHINE SULFATE (PF) 2 MG/ML IV SOLN
1.0000 mg | Freq: Once | INTRAVENOUS | Status: AC
  Administered 2022-02-06: 1 mg via INTRAVENOUS
  Filled 2022-02-06: qty 1

## 2022-02-06 MED ORDER — SODIUM CHLORIDE 0.9 % IV SOLN
8.0000 mg/kg | Freq: Every day | INTRAVENOUS | Status: DC
  Administered 2022-02-06 – 2022-02-11 (×6): 500 mg via INTRAVENOUS
  Filled 2022-02-06 (×7): qty 10

## 2022-02-06 MED ORDER — AMLODIPINE BESYLATE 5 MG PO TABS
5.0000 mg | ORAL_TABLET | Freq: Every day | ORAL | Status: DC
  Administered 2022-02-06 – 2022-02-16 (×10): 5 mg via ORAL
  Filled 2022-02-06 (×12): qty 1

## 2022-02-06 MED ORDER — ACETAMINOPHEN 325 MG PO TABS
650.0000 mg | ORAL_TABLET | Freq: Four times a day (QID) | ORAL | Status: DC | PRN
  Administered 2022-02-07 – 2022-02-08 (×3): 650 mg via ORAL
  Filled 2022-02-06 (×3): qty 2

## 2022-02-06 NOTE — Progress Notes (Addendum)
PROGRESS NOTE                                                                                                                                                                                                             Patient Demographics:    Allen Wheeler, is a 88 y.o. male, DOB - 07-18-1919, CZY:[id number]  Outpatient Primary MD for the patient is [name], MD    LOS - 6  Admit date - 01/31/2022    Chief Complaint  Patient presents with   Code Sepsis       Brief Narrative (HPI from H&P)   88 y.o. M with PMH of GERD, depression/anxiety and discharged home yesterday after being hospitalized with Covid PNA.   He recently visited [place] to celebrate his 89th birthday and became ill after traveling home.  He was admitted 10/22-10/23 and treated with Molnupavir.  After discharge home, family states he initially seemed to be doing well, however slept most of the day today and was increasingly lethargic and high fever, in the ER he was found to have bacterial right lower lobe pneumonia, further work-up showed MRSA bacteremia, in the ER he also had a run of V. tach.  He was admitted by PCCM, in ICU he had Foley catheter placement by urology, he was stabilized and transferred to my care on 02/04/2022 on day 4 of hospital stay.   Subjective:   Patient in bed, appears comfortable, denies any headache, no fever, no chest pain or pressure, no shortness of breath , no abdominal pain. No new focal weakness.   Assessment  & Plan :   Severe sepsis, MRSA bacteremia after recent episode of COVID-pneumonia likely due to bacterial infection likely due to aspiration pneumonia caused at home. he rightly got weak and deconditioned due to recent COVID-19 infection and aspirated, developed bacterial pneumonia and MRSA bacteremia.  Has been seen by ID and PCCM, initially required BiPAP, now stable on 2 L nasal cannula oxygen, request speech to  evaluate.  Antibiotics per ID. Echocardiogram does not show any evidence of endocarditis.  Abscess pathophysiology has resolved.  Acute hypoxic respiratory failure due to pneumonia as above.  Much improved.  Initially required BiPAP now on nasal cannula, have speech evaluate, antibiotics for bacterial pneumonia, steroids for recent COVID infection.  Speech to evaluate to rule out ongoing  aspiration.  Monitor closely.  Overall improving.  V.Tach in the ER.  Likely due to sepsis.  Resolved after amiodarone.  Avoiding beta-blocker or nodal agents due to underlying history of bradycardia and heart block.  Monitor electrolytes.  Junctional bradycardia with possible history of underlying second-degree AV block Mobitz type I.  Avoiding nodal agents.  Not a candidate for pacemaker due to bacteremia.  TSH is stable.  Continue to monitor on telemetry.  Blood pressure stable.  She had cardiology input discussed with Dr. Harrington Challenger on 02/06/2022.  No further work-up or intervention.  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  Gentle oral Lasix x1 on 02/04/2022, placed on low-dose hydralazine and Imdur if blood pressure permits, avoiding ACE/ARB due to recent AKI, no beta-blocker due to bradycardia and possible underlying second-degree AV block Mobitz type I.  Foley catheter trauma in ICU causing hematuria, underlying history of BPH.  Foley catheter in ICU was pulled due to patient's delirium, urology was consulted they have placed a Foley catheter on 02/03/2022.  Continue Flomax likely will be discharged home with Foley catheter with outpatient urology follow-up.  AKI.  Resolved.  Hypothyroidism.  On Synthroid continue at home dose, TSH stable.  Hypertension.  Placed on low-dose Norvasc and monitor.       Condition - Extremely Guarded  Family Communication  :    Son on [name] [phone] 02/04/22  Code Status :  No CPR or intubation, discussed with son on 02/04/2022,  02/06/22.  Consults  : PCCM, cardiology, urology  PUD Prophylaxis :  PPI   Procedures  :     TTE - 1. Left ventricular ejection fraction, by estimation, is 40 to 45%. The left ventricle has mildly decreased function. The left ventricle demonstrates global hypokinesis. There is moderate asymmetric left ventricular hypertrophy of the septal segment. Left ventricular diastolic parameters are consistent with Grade I diastolic dysfunction (impaired relaxation).  2. Right ventricular systolic function is normal. The right ventricular size is normal.  3. Left atrial size was mildly dilated.  4. The mitral valve is normal in structure. Mild mitral valve regurgitation. No evidence of mitral stenosis.  5. The aortic valve is calcified. There is moderate calcification of the aortic valve. There is mild thickening of the aortic valve. Aortic valve regurgitation is not visualized. Aortic valve sclerosis/calcification is present, without any evidence of aortic stenosis. Aortic valve area, by VTI measures 2.00 cm. Aortic valve mean gradient measures 8.0 mmHg. Aortic valve Vmax measures 1.96 m/s.  6. The inferior vena cava is normal in size with greater than 50% respiratory variability, suggesting right atrial pressure of 3 mmHg.      Disposition Plan  :  Status is: Inpatient  DVT Prophylaxis  :    heparin injection 5,000 Units Start: 01/31/22 2200 SCDs Start: 01/31/22 2105   Lab Results  Component Value Date   PLT 203 02/06/2022    Diet :  Diet Order             Diet regular Room service appropriate? Yes with Assist; Fluid consistency: Thin  Diet effective now                    Inpatient Medications  Scheduled Meds:  Chlorhexidine Gluconate Cloth  6 each Topical Daily   citalopram  10 mg Oral q AM   dexamethasone (DECADRON) injection  6 mg Intravenous Q24H   heparin  5,000 Units Subcutaneous Q8H   levothyroxine  50 mcg  Oral QAC breakfast   nystatin   Topical TID   pantoprazole  40  mg Oral Daily   tamsulosin  0.8 mg Oral QPC supper   Continuous Infusions:  vancomycin 1,250 mg (02/05/22 1853)   PRN Meds:.acetaminophen, docusate sodium, guaiFENesin, ipratropium-albuterol, LORazepam, polyethylene glycol    Objective:   Vitals:   02/06/22 0000 02/06/22 0435 02/06/22 0522 02/06/22 0800  BP: 121/65  (!) 147/78 (!) 141/92  Pulse: (!) 46  (!) 48 (!) 51  Resp: '18  18 18  '$ Temp: 97.6 F (36.4 C)  98.1 F (36.7 C) 98 F (36.7 C)  TempSrc: Oral  Oral Oral  SpO2: 98%  94% 98%  Weight:  63.4 kg    Height:        Wt Readings from Last 3 Encounters:  02/06/22 63.4 kg  05/15/21 70.4 kg  05/12/21 72.1 kg     Intake/Output Summary (Last 24 hours) at 02/06/2022 1026 Last data filed at 02/06/2022 0527 Gross per 24 hour  Intake --  Output 200 ml  Net -200 ml     Physical Exam  Awake Alert, No new F.N deficits, Normal affect [place].AT,PERRAL Supple Neck, No JVD,   Symmetrical Chest wall movement, Good air movement bilaterally, CTAB RRR,No Gallops, Rubs or new Murmurs,  +ve B.Sounds, Abd Soft, No tenderness,   No Cyanosis, Clubbing or edema     RN pressure injury documentation: Pressure Injury 02/01/22 Buttocks Right;Left Stage 1 -  Intact skin with non-blanchable redness of a localized area usually over a bony prominence. (Active)  02/01/22 0055  Location: Buttocks  Location Orientation: Right;Left  Staging: Stage 1 -  Intact skin with non-blanchable redness of a localized area usually over a bony prominence.  Wound Description (Comments):   Present on Admission:   Dressing Type Foam - Lift dressing to assess site every shift 02/06/22 0756      Data Review:    CBC Recent Labs  Lab 01/31/22 1822 01/31/22 1857 02/02/22 0946 02/03/22 1043 02/04/22 0603 02/05/22 0334 02/06/22 0348  WBC 9.8   < > 17.0* 15.7* 11.2* 11.3* 11.1*  HGB 12.5*   < > 11.6* 11.7* 10.9* 10.7* 11.9*  HCT 37.4*   < > 34.1* 34.1* 31.0* 31.2* 34.9*  PLT 194   < > 192 192 210  199 203  MCV 94.7   < > 91.9 90.7 89.6 90.4 90.9  MCH 31.6   < > 31.3 31.1 31.5 31.0 31.0  MCHC 33.4   < > 34.0 34.3 35.2 34.3 34.1  RDW 14.3   < > 14.1 14.2 13.9 14.1 14.0  LYMPHSABS 2.1  --   --   --  0.4* 0.5* 0.6*  MONOABS 0.2  --   --   --  0.3 0.5 0.4  EOSABS 0.0  --   --   --  0.0 0.0 0.0  BASOSABS 0.0  --   --   --  0.0 0.0 0.0   < > = values in this interval not displayed.    Electrolytes Recent Labs  Lab 01/31/22 1822 01/31/22 1857 01/31/22 2013 01/31/22 2015 02/01/22 0525 02/01/22 0728 02/02/22 0946 02/02/22 1159 02/03/22 0111 02/04/22 0603 02/05/22 0334 02/06/22 0348  NA 137   < >  --   --    < >  --  134*  --  134* 134* 134* 134*  K 4.3   < >  --   --    < >  --  3.8  --  3.8 3.8 4.3 4.2  CL 103  --   --   --    < >  --  105  --  103 103 103 102  CO2 21*  --   --   --    < >  --  21*  --  19* '23 23 22  '$ GLUCOSE 144*  --   --   --    < >  --  125*  --  128* 144* 148* 129*  BUN 25*  --   --   --    < >  --  28*  --  27* 23 25* 23  CREATININE 1.88*  --   --   --    < >  --  1.48*  --  1.35* 1.14 1.29* 1.12  CALCIUM 8.7*  --   --   --    < >  --  7.9*  --  7.9* 7.8* 7.7* 8.1*  AST 54*  --   --   --   --   --   --   --   --  '19 18 15  '$ ALT 29  --   --   --   --   --   --   --   --  '27 24 22  '$ ALKPHOS 73  --   --   --   --   --   --   --   --  48 47 48  BILITOT 1.5*  --   --   --   --   --   --   --   --  1.0 0.9 1.0  ALBUMIN 2.7*  --   --   --   --   --   --   --   --  2.0* 1.9* 2.0*  MG  --   --  2.0  --    < >  --  2.4  --  2.0 2.0 1.9 1.9  CRP  --   --  17.4*  --   --   --   --   --  30.4* 13.2* 10.3* 11.0*  DDIMER  --   --  16.33*  --   --   --   --   --   --   --   --   --   PROCALCITON  --   --  2.24  --   --   --   --   --  1.28 0.49 0.42 0.24  LATICACIDVEN 4.4*  --   --  3.7*  --  2.3*  --  1.8  --   --   --   --   INR 1.3*  --   --   --   --   --   --   --   --   --   --   --   TSH  --   --   --   --   --   --   --   --   --  0.543  --   --   HGBA1C 5.9*   --   --   --   --   --   --   --   --   --   --   --   BNP  --   --   --   --   --   --   --   --   --  1,035.7* 809.5* 731.9*   < > = values in this interval not displayed.    ------------------------------------------------------------------------------------------------------------------ No results for input(s): "CHOL", "HDL", "LDLCALC", "TRIG", "CHOLHDL", "LDLDIRECT" in the last 72 hours.  Lab Results  Component Value Date   HGBA1C 5.9 (H) 01/31/2022    Recent Labs    02/04/22 0603  TSH 0.543     Radiology Reports DG Chest Port 1 View  Result Date: 02/04/2022 CLINICAL DATA:  Shortness of breath EXAM: PORTABLE CHEST 1 VIEW COMPARISON:  January 31, 2022 FINDINGS: Opacity in the right lung has mildly worsened in the interval now extending from the base into the mid lung. Haziness over the right base could represent layering effusion. Infiltrate in the left base appears worsened as well. No other interval changes. IMPRESSION: 1. The right-sided infiltrate is worsened in the interval. The left-sided infiltrate is mildly worsened as well. Possible small layering right effusion. No other changes. Electronically Signed   By: Dorise Bullion III M.D.   On: 02/04/2022 07:50      Signature  Lala Lund M.D on 02/06/2022 at 10:26 AM   -  To page go to [email or web address]

## 2022-02-06 NOTE — Progress Notes (Signed)
Speech Language Pathology Treatment: Dysphagia  Patient Details Name: Allen Wheeler MRN: [id number] DOB: 1920/01/28 Today's Date: 02/06/2022 Time: 2263-3354 SLP Time Calculation (min) (ACUTE ONLY): 15 min  Assessment / Plan / Recommendation Clinical Impression  Patient seen by SLP for skilled treatment session focused on dysphagia goals. Patient was awake and alert, sitting in recliner chair with lunch tray in front of him. He appears to have mainly had liquids and approximately 25% of solids. Patient denies dysphagia and his RN who arrived during session also denies observing patient with any swallowing difficulties. Patient did not wish to eat any solids but he did drink a few sips of thin liquids (iced tea) from cup with swallow initiation appearing timely and no overt s/s aspiration or penetration. Patient's voice was clear and no coughing observed at any time during this session. SLP recommending for patient to continue on current diet and will determine if any need for objective swallow study to r/o aspiration.    HPI HPI: Pt is a 88 year old male with a recent diagnosis of COVID-pneumonia, treated, discharged home then reutrned the following day due to increased lethargy. CXR 10/29: right-sided infiltrate is worsened in the interval. The  left-sided infiltrate is mildly worsened as well. Pt with acute hypoxic respiratory failure, sever sepsis with acute metabolic encephalopahthy due to PNA. PMH: GERD, depression/anxiety      SLP Plan  Continue with current plan of care      Recommendations for follow up therapy are one component of a multi-disciplinary discharge planning process, led by the attending physician.  Recommendations may be updated based on patient status, additional functional criteria and insurance authorization.    Recommendations  Diet recommendations: Regular;Thin liquid Liquids provided via: Cup;Straw Medication Administration: Whole meds with  liquid Supervision: Patient able to self feed Compensations: Slow rate Postural Changes and/or Swallow Maneuvers: Seated upright 90 degrees                Oral Care Recommendations: Oral care BID Follow Up Recommendations: No SLP follow up Assistance recommended at discharge: PRN SLP Visit Diagnosis: Dysphagia, unspecified (R13.10) Plan: Continue with current plan of care           Sonia Baller, MA, CCC-SLP Speech Therapy

## 2022-02-06 NOTE — Progress Notes (Signed)
Rounding Note    Patient Name: ZAKIAH BECKERMAN Date of Encounter: 02/06/2022  [place] Cardiologist: New  Subjective   Pt comfortable in bed   Wants the TV clicker   NO CP   Inpatient Medications    Scheduled Meds:  Chlorhexidine Gluconate Cloth  6 each Topical Daily   citalopram  10 mg Oral q AM   dexamethasone (DECADRON) injection  6 mg Intravenous Q24H   heparin  5,000 Units Subcutaneous Q8H   levothyroxine  50 mcg Oral QAC breakfast   nystatin   Topical TID   pantoprazole  40 mg Oral Daily   tamsulosin  0.8 mg Oral QPC supper   Continuous Infusions:  vancomycin 1,250 mg (02/05/22 1853)   PRN Meds: acetaminophen, docusate sodium, guaiFENesin, ipratropium-albuterol, LORazepam, polyethylene glycol   Vital Signs    Vitals:   02/06/22 0000 02/06/22 0435 02/06/22 0522 02/06/22 0800  BP: 121/65  (!) 147/78 (!) 141/92  Pulse: (!) 46  (!) 48 (!) 51  Resp: '18  18 18  '$ Temp: 97.6 F (36.4 C)  98.1 F (36.7 C) 98 F (36.7 C)  TempSrc: Oral  Oral Oral  SpO2: 98%  94% 98%  Weight:  63.4 kg    Height:        Intake/Output Summary (Last 24 hours) at 02/06/2022 0847 Last data filed at 02/06/2022 0527 Gross per 24 hour  Intake --  Output 200 ml  Net -200 ml      02/06/2022    4:35 AM 02/03/2022    5:00 AM 02/01/2022    4:35 AM  Last 3 Weights  Weight (lbs) 139 lb 12.4 oz 145 lb 1 oz 144 lb 10 oz  Weight (kg) 63.4 kg 65.8 kg 65.6 kg      Telemetry     SB, 2:1 AV block   HR 40s to 60s  - Personally Reviewed  ECG    No new  - Personally Reviewed  Physical Exam   GEN: No acute distress.   Neck: No JVD Cardiac: RRR, no murmurs, Respiratory: Rhonchi after coughing  GI: Soft, nontender, non-distended  Ext   No edema   Labs    High Sensitivity Troponin:   Recent Labs  Lab 01/28/22 0804  TROPONINIHS 33*     Chemistry Recent Labs  Lab 02/04/22 0603 02/05/22 0334 02/06/22 0348  NA 134* 134* 134*  K 3.8 4.3 4.2  CL 103 103 102   CO2 '23 23 22  '$ GLUCOSE 144* 148* 129*  BUN 23 25* 23  CREATININE 1.14 1.29* 1.12  CALCIUM 7.8* 7.7* 8.1*  MG 2.0 1.9 1.9  PROT 5.6* 5.4* 5.6*  ALBUMIN 2.0* 1.9* 2.0*  AST '19 18 15  '$ ALT '27 24 22  '$ ALKPHOS 48 47 48  BILITOT 1.0 0.9 1.0  GFRNONAA 57* 49* 59*  ANIONGAP '8 8 10    '$ Lipids No results for input(s): "CHOL", "TRIG", "HDL", "LABVLDL", "LDLCALC", "CHOLHDL" in the last 168 hours.  Hematology Recent Labs  Lab 02/04/22 0603 02/05/22 0334 02/06/22 0348  WBC 11.2* 11.3* 11.1*  RBC 3.46* 3.45* 3.84*  HGB 10.9* 10.7* 11.9*  HCT 31.0* 31.2* 34.9*  MCV 89.6 90.4 90.9  MCH 31.5 31.0 31.0  MCHC 35.2 34.3 34.1  RDW 13.9 14.1 14.0  PLT 210 199 203   Thyroid  Recent Labs  Lab 02/04/22 0603  TSH 0.543  FREET4 0.87    BNP Recent Labs  Lab 02/04/22 0603 02/05/22 0334 02/06/22 0348  BNP  1,035.7* 809.5* 731.9*    DDimer  Recent Labs  Lab 01/31/22 2013  DDIMER 16.33*     Radiology    No results found.  Cardiac Studies  Echo   02/01/22 1. Left ventricular ejection fraction, by estimation, is 40 to 45%. The  left ventricle has mildly decreased function. The left ventricle  demonstrates global hypokinesis. There is moderate asymmetric left  ventricular hypertrophy of the septal segment.  Left ventricular diastolic parameters are consistent with Grade I  diastolic dysfunction (impaired relaxation).   2. Right ventricular systolic function is normal. The right ventricular  size is normal.   3. Left atrial size was mildly dilated.   4. The mitral valve is normal in structure. Mild mitral valve  regurgitation. No evidence of mitral stenosis.   5. The aortic valve is calcified. There is moderate calcification of the  aortic valve. There is mild thickening of the aortic valve. Aortic valve  regurgitation is not visualized. Aortic valve sclerosis/calcification is  present, without any evidence of  aortic stenosis. Aortic valve area, by VTI measures 2.00 cm. Aortic  valve  mean gradient measures 8.0 mmHg. Aortic valve Vmax measures 1.96 m/s.   6. The inferior vena cava is normal in size with greater than 50%  respiratory variability, suggesting right atrial pressure of 3 mmHg.   Patient Profile  CAIGE ALMEDA is a 88 y.o. male with a hx of GERD, anxiety, depression, normal cardiac stress test (09/20/2012), osteoarthritis who is being seen 02/05/2022 for the evaluation of bradycardia at the request of Dr. Candiss Norse.  Assessment & Plan    1 COnduction dz.   Pt with continued bradycardia   Known LBBB Appears to be tolerating  No signficiant pauses.    O2 sats low at times, question if accurate.    IF truly low this could exacerbate Avoid drugs that slow  2 Torsades   Occurred on admit. Pt in respiratory distress at time   Was on antiviral though EKG without severe QT prolongation. No recurrence      3  HFrEF   Volume status OK   Volume status appears overall OK    FOllow and dose as needed    Will sign of   Please reconsult if conditions change  For questions or updates, please contact [place] Please consult [email or web address] for contact info under        Signed, Dorris Carnes, MD  02/06/2022, 8:47 AM

## 2022-02-06 NOTE — Progress Notes (Signed)
[facility] Wheeler for Infectious Disease  Date of Admission:  01/31/2022           Reason for visit: Follow up on MRSA Bacteremia  Current antibiotics: Vancomycin   ASSESSMENT:    88 y.o. male admitted with:  MRSA bacteremia: Admitted with MRSA bacteremia.  This is in the setting of recent COVID-pneumonia with suspected postviral bacterial infection given presentation with hypoxia and worsened airspace disease.  His pulmonary status has improved since admission.  Initial blood cultures from this admission were positive on 01/31/2022.  Repeat blood cultures drawn again yesterday remain persistently positive.  Of note, patient did have negative blood cultures on 01/28/2022 during his admission for COVID-19. Recent COVID-19 infection: Patient's oxygen saturations have improved.  He initially required BiPAP in the ICU and now is on nasal cannula at approximately 5 L with adequate O2 saturations. Low back pain: This is his only new complaint today.  RECOMMENDATIONS:    Given his persistent bacteremia, will change from vancomycin to daptomycin and ceftaroline Repeat blood cultures tomorrow morning MRI lumbar spine to rule out any new nidus of infection in the setting of persistent bacteremia Lab monitoring Will follow   Principal Problem:   MRSA bacteremia Active Problems:   Pneumonia due to COVID-19 virus   Respiratory failure (HCC)   Pressure injury of skin   Hypoxia    MEDICATIONS:    Scheduled Meds:  amLODipine  5 mg Oral Daily   Chlorhexidine Gluconate Cloth  6 each Topical Daily   citalopram  10 mg Oral q AM   dexamethasone (DECADRON) injection  6 mg Intravenous Q24H   heparin  5,000 Units Subcutaneous Q8H   levothyroxine  50 mcg Oral QAC breakfast   nystatin   Topical TID   pantoprazole  40 mg Oral Daily   tamsulosin  0.8 mg Oral QPC supper   Continuous Infusions:  ceFTAROline (TEFLARO) IV 400 mg (02/06/22 1237)   DAPTOmycin (CUBICIN) 500 mg in sodium  chloride 0.9 % IVPB     PRN Meds:.acetaminophen, docusate sodium, guaiFENesin, ipratropium-albuterol, LORazepam, polyethylene glycol  SUBJECTIVE:   24 hour events:  No acute events Blood cultures remain positive Afebrile Respiratory status stable Complains of new low back pain although reports its not too bothersome   Review of Systems  All other systems reviewed and are negative.     OBJECTIVE:   Blood pressure (!) 144/80, pulse 61, temperature 98.1 F (36.7 C), temperature source Oral, resp. rate 18, height '5\' 6"'$  (1.676 m), weight 63.4 kg, SpO2 98 %. Body mass index is 22.56 kg/m.  Physical Exam Constitutional:      Appearance: Normal appearance.  HENT:     Head: Normocephalic and atraumatic.  Eyes:     Extraocular Movements: Extraocular movements intact.     Conjunctiva/sclera: Conjunctivae normal.  Pulmonary:     Comments: Respirations even and nonlabored on nasal cannula Abdominal:     General: There is no distension.     Palpations: Abdomen is soft.  Musculoskeletal:     Cervical back: Normal range of motion and neck supple.     Comments: No paraspinal or lumbar spine tenderness  Skin:    General: Skin is warm and dry.  Neurological:     General: No focal deficit present.     Mental Status: He is alert and oriented to person, place, and time.  Psychiatric:        Mood and Affect: Mood normal.  Behavior: Behavior normal.      Lab Results: Lab Results  Component Value Date   WBC 11.1 (H) 02/06/2022   HGB 11.9 (L) 02/06/2022   HCT 34.9 (L) 02/06/2022   MCV 90.9 02/06/2022   PLT 203 02/06/2022    Lab Results  Component Value Date   NA 134 (L) 02/06/2022   K 4.2 02/06/2022   CO2 22 02/06/2022   GLUCOSE 129 (H) 02/06/2022   BUN 23 02/06/2022   CREATININE 1.12 02/06/2022   CALCIUM 8.1 (L) 02/06/2022   GFRNONAA 59 (L) 02/06/2022   GFRAA 54 (L) 07/16/2016    Lab Results  Component Value Date   ALT 22 02/06/2022   AST 15 02/06/2022    ALKPHOS 48 02/06/2022   BILITOT 1.0 02/06/2022       Component Value Date/Time   CRP 11.0 (H) 02/06/2022 0348    No results found for: "ESRSEDRATE"   I have reviewed the micro and lab results in Epic.  Imaging: No results found.   Imaging independently reviewed in Epic.    Raynelle Highland for Infectious Disease [facility] [phone] pager 02/06/2022, 1:37 PM

## 2022-02-06 NOTE — Progress Notes (Signed)
Physical Therapy Treatment Patient Details Name: Allen Wheeler MRN: [id number] DOB: [birth year] Today's Date: 02/06/2022   History of Present Illness Patient is 88 year old male with a recent diagnosis of COVID-pneumonia, treated, discharged home then reutrned the following day due to increased lethargy. Pt in junctional bradycardia, with intermittent runs of VT and  was hypoxic and placed on nonrebreather; Pt with acute hypoxic rspoiratory failure, sever sepsis wiht acute metabolic encephalopahthy due to PNA.    PT Comments    Pt making slow, steady progress. Able to amb short distance in room with assist. Pt continues to fatigue quickly and with dyspnea. SpO2 monitor not reading at all with activity. Dyspnea 3-4/4 with activity on 4-5L O2. Pt will need assistance at home for most activity due to low functional activity tolerance and generalized weakness. If pt doesn't have this support at home may need SNF.    Recommendations for follow up therapy are one component of a multi-disciplinary discharge planning process, led by the attending physician.  Recommendations may be updated based on patient status, additional functional criteria and insurance authorization.  Follow Up Recommendations  Home health PT (If patient has assist with mobility and ADL's at home. If doesn't have assist at home may need SNF)     Assistance Recommended at Discharge Frequent or constant Supervision/Assistance  Patient can return home with the following A little help with walking and/or transfers;A little help with bathing/dressing/bathroom;Assistance with cooking/housework;Assist for transportation;Help with stairs or ramp for entrance   Equipment Recommendations  None recommended by PT    Recommendations for Other Services       Precautions / Restrictions Precautions Precautions: Fall Precaution Comments: Monitor O2, covid+ Restrictions Weight Bearing Restrictions: No     Mobility  Bed  Mobility Overal bed mobility: Needs Assistance Bed Mobility: Supine to Sit     Supine to sit: Min guard, HOB elevated     General bed mobility comments: Incr time and assist for safety and lines    Transfers Overall transfer level: Needs assistance Equipment used: Rollator (4 wheels) Transfers: Sit to/from Stand Sit to Stand: Min assist           General transfer comment: Assist to bring hips up and for balance.    Ambulation/Gait Ambulation/Gait assistance: Min assist, +2 safety/equipment Gait Distance (Feet): 30 Feet Assistive device: Rollator (4 wheels) Gait Pattern/deviations: Step-through pattern, Decreased stride length, Trunk flexed Gait velocity: decr Gait velocity interpretation: <1.8 ft/sec, indicate of risk for recurrent falls   General Gait Details: Assist for balance   Stairs             Wheelchair Mobility    Modified Rankin (Stroke Patients Only)       Balance Overall balance assessment: Needs assistance Sitting-balance support: Feet supported Sitting balance-Leahy Scale: Good     Standing balance support: Single extremity supported, During functional activity Standing balance-Leahy Scale: Poor Standing balance comment: UE support for static standing                            Cognition Arousal/Alertness: Awake/alert Behavior During Therapy: WFL for tasks assessed/performed Overall Cognitive Status: Within Functional Limits for tasks assessed                                 General Comments: tangential but likely at baseline        Exercises  General Comments General comments (skin integrity, edema, etc.): Pt on 5L O2. SpO2 97% at rest. Unable to obtain any SpO2 reading with mobility.      Pertinent Vitals/Pain Pain Assessment Pain Assessment: Faces Faces Pain Scale: Hurts little more Pain Location: chronic back Pain Descriptors / Indicators: Grimacing, Guarding Pain Intervention(s):  Monitored during session, Repositioned    Home Living                          Prior Function            PT Goals (current goals can now be found in the care plan section) Progress towards PT goals: Progressing toward goals    Frequency    Min 3X/week      PT Plan Current plan remains appropriate    Co-evaluation              AM-PAC PT "6 Clicks" Mobility   Outcome Measure  Help needed turning from your back to your side while in a flat bed without using bedrails?: A Little Help needed moving from lying on your back to sitting on the side of a flat bed without using bedrails?: A Little Help needed moving to and from a bed to a chair (including a wheelchair)?: A Little Help needed standing up from a chair using your arms (e.g., wheelchair or bedside chair)?: A Little Help needed to walk in hospital room?: A Little Help needed climbing 3-5 steps with a railing? : Total 6 Click Score: 16    End of Session Equipment Utilized During Treatment: Oxygen Activity Tolerance: Patient limited by fatigue Patient left: in chair;with call bell/phone within reach;with chair alarm set Nurse Communication: Mobility status PT Visit Diagnosis: Unsteadiness on feet (R26.81);Other abnormalities of gait and mobility (R26.89);Muscle weakness (generalized) (M62.81);Difficulty in walking, not elsewhere classified (R26.2)     Time: 1133-1200 PT Time Calculation (min) (ACUTE ONLY): 27 min  Charges:  $Gait Training: 23-37 mins                     [place] Office [place] 02/06/2022, 1:29 PM

## 2022-02-06 NOTE — TOC Initial Note (Signed)
Transition of Care [facility]) - Initial/Assessment Note    Patient Details  Name: Allen Wheeler MRN: [id number] Date of Birth: 1920/01/25  [facility]) CM/SW Contact:    Benard Halsted, LCSW Phone Number: 02/06/2022, 4:51 PM  Clinical Narrative:                 CSW received consult for possible SNF placement at time of discharge. CSW spoke with patient's son. He reported that patient lives alone and is currently unable to care for himself at home given patient's current physical needs and fall risk. Son lives in [place]. Patient expressed understanding of PT recommendation and is agreeable to SNF placement at time of discharge. Son reports preference for 1-[place], 2-[place] 3-[facility]. CSW discussed insurance authorization process and will provide Medicare SNF ratings list. CSW will send out referrals for review and provide bed offers as available.   Skilled Nursing Rehab Facilities-   [email or web address]   Ratings out of 5 stars (the highest)   Name Address  Phone # [place] Inspection Overall  [facility] [address] [place] '5 5 2 4  '$ [facility]  [address] [place], [place] [phone] '4 2 5 5  '$ [facility] [place], [place] '1 1 1 1  '$ [facility] [place], [place] '2 2 4 4  '$ [facility] [address] [place] '1 1 2 1  '$ [place] [facility] [address], [place] '3 2 4 4  '$ [facility] [address] [place] '5 1 3 3  '$ [facility] [address] [place] '5 2 3 4  '$ [address] ([place]) [place], [place] [phone] '4 1 2 1  '$ [facility] [phone] [address], [place] [phone] '4 1 1 1  '$ [facility] [address], [facility] [phone] '3 1 2 1  '$ [facility] ([place]) [place]. [place],  [place] [phone] '4 1 1 1  '$ Dustin Flock [address] [phone] '4 2 4 4          '$ [facility] [address], [address] [phone]      [facility] [place] '4 1 3 2  '$ [facility] [address] [place] '3 1 5 4  '$ [facility], [place] [address], [place] [phone] '1 1 1 1  '$ [facility] [address], [facility] [phone] '2 2 3 3          '$ [address] (no [facility]) [place] [facility], [place] [phone] '5 5 5 5  '$ [facility] (No Humana) [address] [place], [place] '4 1 4 3  '$ [facility] (No UHC) [place], [place] [address] [phone] '5 5 5 5  '$ [facility] [address], [facility] [phone] '2 3 5 5  '$ [place] [address] [place] '1 1 2 1  '$ [facility] [address] '3 1 1 1  '$ Horace Byers, [place] '5 2 4 5  '$ [facility]  [address] [place] '2 2 1 1  '$ [facility] [address], [place] '3 2 1 1  '$ [facility] [place], [place] '2 2 2 2          '$ [facility] [address], [place] '1 1 1 1  '$ [place] [address], [address]  [phone] '2 4 2 2  '$ [facility] [place] [address], [place] [phone] '4 2 3 3  '$ [place] [address], [place] '2 1 1 1  '$ [employer] [facility] (No Humana) [address] [place] [phone] '2 2 3 3  '$ [facility] [address] [place] [phone] '2 1 1 1          '$ [facility] [place], [place] '5 4 5 5  '$ [facility]  [address], [place] '2 1 2 1  '$ [facility]) [place] [address], [place] '3 1 4 3  '$ [place] [address], [place] '3 3 4 4  '$ [address] [place], [place] '2 3 1 1  '$ [place] [facility]) [address] [place] [phone] '2 1 4 3     '$ Expected Discharge Plan: [place] Barriers to Discharge: Continued Medical Work up, SNF Pending bed offer   Patient Goals and CMS Choice Patient states their goals for this hospitalization and ongoing recovery are:: Rehab CMS Medicare.gov Compare Post Acute Care list provided to:: Patient Represenative (must comment) Choice offered to / list presented to : Adult Children  Expected Discharge Plan and Services Expected Discharge Plan: [place] In-house Referral: Clinical Social Work   Post Acute Care Choice: [place] Living arrangements for the past 2 months: [place]                                      Prior Living Arrangements/Services Living arrangements for the past 2 months: Single Family Home Lives with:: Self Patient language and need for interpreter reviewed:: Yes Do you feel safe going back to the place where you live?: Yes      Need for Family Participation in Patient Care: Yes (Comment) Care giver support system in place?: Yes (comment)   Criminal Activity/Legal Involvement Pertinent to Current Situation/Hospitalization: No - Comment as needed  Activities of Daily Living      Permission Sought/Granted Permission sought to share information with : Facility Sport and exercise psychologist, Family Supports Permission granted to share information with : Yes, Verbal Permission Granted  Share Information with NAME: [name]  Permission granted to share info w AGENCY: SNFs  Permission granted to share info w Relationship: Son  Permission granted to share info w Contact Information: [phone]  Emotional Assessment Appearance:: Appears stated age Attitude/Demeanor/Rapport: Unable to Assess Affect (typically observed): Unable to Assess (COVID+) Orientation: : Oriented to Self,  Oriented to Place, Oriented to  Time, Oriented to Situation Alcohol / Substance Use: Not Applicable Psych Involvement: No (comment)  Admission diagnosis:  Respiratory failure ([place]) [J96.90] Patient Active Problem List   Diagnosis Date Noted   Hypoxia    MRSA bacteremia 02/01/2022   Pressure injury of skin 02/01/2022   Respiratory failure ([place]) 01/31/2022   Encephalopathy acute    AKI (acute kidney injury) ([place])    Pneumonia due to COVID-19 virus 01/28/2022   Renal impairment 01/28/2022   Genitourinary infection, candidal 01/28/2022   Neuropathy 04/29/2020   Nocturnal leg cramps 04/29/2020   IBS (irritable bowel syndrome) 01/06/2020   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   Lumbar pain 01/06/2020   Multiple environmental allergies 12/28/2016   Restless legs syndrome 12/28/2016   Inguinal hernia 06/21/2016   Right inguinal hernia 04/19/2016  Left hand weakness    Sepsis ([place]) 02/15/2016   Cholelithiasis with acute cholangitis with biliary obstruction 02/14/2016   Hypothyroidism 12/13/2015   Eczema 09/16/2014   Abnormal ECG 09/03/2012   SOMNOLENCE 10/31/2009   BPH with urinary obstruction 02/16/2009   Pruritic disorder 02/16/2009   WEAKNESS 02/16/2009   ELEVATED BLOOD PRESSURE 02/16/2009   Anxiety 05/27/2008   Depression 05/27/2008   GERD 05/27/2008   Osteoarthritis 05/27/2008   NEPHROLITHIASIS, HX OF 05/27/2008   PCP:  [name], MD Pharmacy:   [facility] [id number] - [address] [place] [address] Phone: [phone] Fax: [phone] PHARMACY [id number] - [address], [place] [address] Phone: [phone] Fax: [phone]  [place], [place] [address] Phone: [phone] Fax: [phone]     Social Determinants of Health (SDOH) Interventions    Readmission Risk Interventions     No  data to display

## 2022-02-06 NOTE — Progress Notes (Signed)
[place] - BLOOD CULTURE   Allen Wheeler is an 88 y.o. male who presented to [facility] on 01/31/2022 with a chief complaint of hypoxia and history of COVID hospitalization and now has MRSA bacteremia. Repeat Cultures are 1/4 for gram positive cocci in clusters. WBC 11.1, afebrile, currently on vancomycin for treatment.  Name of physician (or Provider) Contacted: Dr. Candiss Norse and Dr. Juleen China  Current antibiotics: Vancomycin '1250mg'$  q 24h  Changes to prescribed antibiotics recommended:  Patient is on recommended antibiotics - No changes needed  Sandford Craze, PharmD. [facility] Acute Care PGY-1  02/06/2022 9:05 AM

## 2022-02-06 NOTE — Progress Notes (Signed)
CSW left voicemail for patient's son regarding request for SNF.  Gilmore Laroche, MSW, [facility]

## 2022-02-07 DIAGNOSIS — R7881 Bacteremia: Secondary | ICD-10-CM | POA: Diagnosis not present

## 2022-02-07 DIAGNOSIS — B9562 Methicillin resistant Staphylococcus aureus infection as the cause of diseases classified elsewhere: Secondary | ICD-10-CM | POA: Diagnosis not present

## 2022-02-07 LAB — CBC WITH DIFFERENTIAL/PLATELET
Abs Immature Granulocytes: 0.2 10*3/uL — ABNORMAL HIGH (ref 0.00–0.07)
Basophils Absolute: 0 10*3/uL (ref 0.0–0.1)
Basophils Relative: 0 %
Eosinophils Absolute: 0 10*3/uL (ref 0.0–0.5)
Eosinophils Relative: 0 %
HCT: 35.6 % — ABNORMAL LOW (ref 39.0–52.0)
Hemoglobin: 12 g/dL — ABNORMAL LOW (ref 13.0–17.0)
Immature Granulocytes: 2 %
Lymphocytes Relative: 7 %
Lymphs Abs: 0.7 10*3/uL (ref 0.7–4.0)
MCH: 30.5 pg (ref 26.0–34.0)
MCHC: 33.7 g/dL (ref 30.0–36.0)
MCV: 90.6 fL (ref 80.0–100.0)
Monocytes Absolute: 0.4 10*3/uL (ref 0.1–1.0)
Monocytes Relative: 4 %
Neutro Abs: 9.4 10*3/uL — ABNORMAL HIGH (ref 1.7–7.7)
Neutrophils Relative %: 87 %
Platelets: 197 10*3/uL (ref 150–400)
RBC: 3.93 MIL/uL — ABNORMAL LOW (ref 4.22–5.81)
RDW: 14 % (ref 11.5–15.5)
WBC: 10.7 10*3/uL — ABNORMAL HIGH (ref 4.0–10.5)
nRBC: 0 % (ref 0.0–0.2)

## 2022-02-07 LAB — COMPREHENSIVE METABOLIC PANEL
ALT: 21 U/L (ref 0–44)
AST: 15 U/L (ref 15–41)
Albumin: 2.1 g/dL — ABNORMAL LOW (ref 3.5–5.0)
Alkaline Phosphatase: 49 U/L (ref 38–126)
Anion gap: 11 (ref 5–15)
BUN: 23 mg/dL (ref 8–23)
CO2: 19 mmol/L — ABNORMAL LOW (ref 22–32)
Calcium: 8 mg/dL — ABNORMAL LOW (ref 8.9–10.3)
Chloride: 103 mmol/L (ref 98–111)
Creatinine, Ser: 1.14 mg/dL (ref 0.61–1.24)
GFR, Estimated: 57 mL/min — ABNORMAL LOW (ref >60)
Glucose, Bld: 125 mg/dL — ABNORMAL HIGH (ref 70–99)
Potassium: 4.3 mmol/L (ref 3.5–5.1)
Sodium: 133 mmol/L — ABNORMAL LOW (ref 135–145)
Total Bilirubin: 1 mg/dL (ref 0.3–1.2)
Total Protein: 5.7 g/dL — ABNORMAL LOW (ref 6.5–8.1)

## 2022-02-07 LAB — CULTURE, BLOOD (ROUTINE X 2): Special Requests: ADEQUATE

## 2022-02-07 LAB — PROCALCITONIN: Procalcitonin: 0.18 ng/mL

## 2022-02-07 LAB — CK: Total CK: 21 U/L — ABNORMAL LOW (ref 49–397)

## 2022-02-07 LAB — BRAIN NATRIURETIC PEPTIDE: B Natriuretic Peptide: 478.6 pg/mL — ABNORMAL HIGH (ref 0.0–100.0)

## 2022-02-07 LAB — C-REACTIVE PROTEIN: CRP: 12.4 mg/dL — ABNORMAL HIGH (ref <1.0)

## 2022-02-07 LAB — MAGNESIUM: Magnesium: 1.9 mg/dL (ref 1.7–2.4)

## 2022-02-07 MED ORDER — TRAMADOL HCL 50 MG PO TABS
50.0000 mg | ORAL_TABLET | Freq: Two times a day (BID) | ORAL | Status: DC | PRN
  Administered 2022-02-07 – 2022-02-10 (×5): 50 mg via ORAL
  Filled 2022-02-07 (×5): qty 1

## 2022-02-07 MED ORDER — ORAL CARE MOUTH RINSE: 15.0000 mL | OROMUCOSAL | Status: DC | PRN

## 2022-02-07 NOTE — Progress Notes (Signed)
Brief ID note:  MRI reassuring for no infection.  Afebrile, Tmax 98.5.  O2 requirement stable if not decreasing.  Repeat blood cultures 10/30 both with Staph aureus.  Repeat cultures have been drawn today.  Patient on daptomycin and ceftaroline given persistent bacteremia and was started yesterday.  No other recommendations today and will continue to follow.   Raynelle Highland for Infectious Disease [facility] 02/07/2022, 12:05 PM

## 2022-02-07 NOTE — Progress Notes (Addendum)
Occupational Therapy Treatment Patient Details Name: Allen Wheeler MRN: [id number] DOB: 1920-01-07 Today's Date: 02/07/2022   History of present illness Patient is 88 year old male with a recent diagnosis of COVID-pneumonia, treated, discharged home then reutrned the following day due to increased lethargy. Pt in junctional bradycardia, with intermittent runs of VT and  was hypoxic and placed on nonrebreather; Pt with acute hypoxic rspoiratory failure, sever sepsis wiht acute metabolic encephalopahthy due to PNA.   OT comments  Patient with incremental progress toward patient focused goals.  Patient just completed lunch, and willing to sit EOB and perform light grooming task with setup and supervision, able to stand times three with Min Guard to increase independence with toileting.  Patient with PT session earlier, and fatigues quickly.  Remaining deficits listed below.  OT to continue efforts in the acute setting, and the patient is trying for home with [facility] rehab.  The patient would need to increase his activity tolerance substantially unless family can provide the needed 24 hour up to Mod A.        Recommendations for follow up therapy are one component of a multi-disciplinary discharge planning process, led by the attending physician.  Recommendations may be updated based on patient status, additional functional criteria and insurance authorization.    Follow Up Recommendations  Home health OT vs SNF depending on progress   Assistance Recommended at Discharge Frequent or constant Supervision/Assistance  Patient can return home with the following  A little help with walking and/or transfers;A little help with bathing/dressing/bathroom;Assistance with cooking/housework;Direct supervision/assist for medications management;Direct supervision/assist for financial management;Assist for transportation;Help with stairs or ramp for entrance   Equipment Recommendations  Tub/shower seat     Recommendations for Other Services      Precautions / Restrictions Precautions Precautions: Fall Precaution Comments: Monitor O2, covid+ Restrictions Weight Bearing Restrictions: No       Mobility Bed Mobility Overal bed mobility: Needs Assistance Bed Mobility: Supine to Sit, Sit to Supine     Supine to sit: Min guard, HOB elevated Sit to supine: Min guard   General bed mobility comments: Increased time due to back pan Patient Response: Cooperative  Transfers Overall transfer level: Needs assistance   Transfers: Sit to/from Stand Sit to Stand: Min assist                 Balance Overall balance assessment: Needs assistance Sitting-balance support: Feet supported Sitting balance-Leahy Scale: Good     Standing balance support: Single extremity supported, During functional activity Standing balance-Leahy Scale: Poor                             ADL either performed or assessed with clinical judgement   ADL       Grooming: Min guard;Sitting;Supervision/safety;Wash/dry hands;Wash/dry face                                      Extremity/Trunk Assessment Upper Extremity Assessment Upper Extremity Assessment: Overall WFL for tasks assessed   Lower Extremity Assessment Lower Extremity Assessment: Defer to PT evaluation   Cervical / Trunk Assessment Cervical / Trunk Assessment: Kyphotic    Vision       Perception     Praxis      Cognition Arousal/Alertness: Awake/alert Behavior During Therapy: WFL for tasks assessed/performed Overall Cognitive Status: Within Functional Limits for tasks assessed  Pertinent Vitals/ Pain       Pain Assessment Pain Assessment: Faces Faces Pain Scale: Hurts a little bit Pain Location: chronic back Pain Descriptors / Indicators: Aching, Tightness Pain Intervention(s): Monitored during session                                                           Frequency  Min 2X/week        Progress Toward Goals  OT Goals(current goals can now be found in the care plan section)  Progress towards OT goals: Progressing toward goals  Acute Rehab OT Goals OT Goal Formulation: With patient Time For Goal Achievement: 02/16/22 Potential to Achieve Goals: Good  Plan Discharge plan remains appropriate    Co-evaluation                 AM-PAC OT "6 Clicks" Daily Activity     Outcome Measure   Help from another person eating meals?: A Little Help from another person taking care of personal grooming?: A Little Help from another person toileting, which includes using toliet, bedpan, or urinal?: A Lot Help from another person bathing (including washing, rinsing, drying)?: A Lot Help from another person to put on and taking off regular upper body clothing?: A Little Help from another person to put on and taking off regular lower body clothing?: A Lot 6 Click Score: 15    End of Session Equipment Utilized During Treatment: Oxygen  OT Visit Diagnosis: Unsteadiness on feet (R26.81);Muscle weakness (generalized) (M62.81);Other symptoms and signs involving cognitive function   Activity Tolerance Patient tolerated treatment well   Patient Left in bed;with call bell/phone within reach   Nurse Communication Mobility status        Time: 1410-1430 OT Time Calculation (min): 20 min  Charges: OT General Charges $OT Visit: 1 Visit OT Treatments $Self Care/Home Management : 8-22 mins  02/07/2022  RP, OTR/L  Acute Rehabilitation Services  Office:  [phone]   Metta Clines 02/07/2022, 2:36 PM

## 2022-02-07 NOTE — NC FL2 (Signed)
[place] LEVEL OF CARE SCREENING TOOL     IDENTIFICATION  Patient Name: Allen Wheeler Birthdate: Jul 22, 1919 Sex: male Admission Date (Current Location): 01/31/2022  [facility] and [place] Number:  Herbalist and Address:  [facility] [place], [place] [address] [place],  [address]      Provider Number: [id number]  Attending Physician Name and Address:  Thurnell Lose, MD  Relative Name and Phone Number:       Current Level of Care: Hospital Recommended Level of Care: [place] Prior Approval Number:    Date Approved/Denied:   PASRR Number: [id number]  Discharge Plan: SNF    Current Diagnoses: Patient Active Problem List   Diagnosis Date Noted   Hypoxia    MRSA bacteremia 02/01/2022   Pressure injury of skin 02/01/2022   Respiratory failure ([place]) 01/31/2022   Encephalopathy acute    AKI (acute kidney injury) ([place])    Pneumonia due to COVID-19 virus 01/28/2022   Renal impairment 01/28/2022   Genitourinary infection, candidal 01/28/2022   Neuropathy 04/29/2020   Nocturnal leg cramps 04/29/2020   IBS (irritable bowel syndrome) 01/06/2020   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   Lumbar pain 01/06/2020   Multiple environmental allergies 12/28/2016   Restless legs syndrome 12/28/2016   Inguinal hernia 06/21/2016   Right inguinal hernia 04/19/2016   Left hand weakness    Sepsis ([place]) 02/15/2016   Cholelithiasis with acute cholangitis with biliary obstruction 02/14/2016   Hypothyroidism 12/13/2015   Eczema 09/16/2014   Abnormal ECG 09/03/2012   SOMNOLENCE 10/31/2009   BPH with urinary obstruction 02/16/2009   Pruritic disorder 02/16/2009   WEAKNESS 02/16/2009   ELEVATED BLOOD PRESSURE 02/16/2009   Anxiety 05/27/2008   Depression 05/27/2008   GERD 05/27/2008   Osteoarthritis 05/27/2008   NEPHROLITHIASIS, HX OF 05/27/2008    Orientation RESPIRATION BLADDER Height & Weight     Time, Self, Situation,  Place  O2 (4L nasal cannula) Continent, Indwelling catheter Weight: 138 lb 0.1 oz (62.6 kg) Height:  '5\' 6"'$  (167.6 cm)  BEHAVIORAL SYMPTOMS/MOOD NEUROLOGICAL BOWEL NUTRITION STATUS      Continent Diet (See dc summary)  AMBULATORY STATUS COMMUNICATION OF NEEDS Skin   Limited Assist Verbally PU Stage and Appropriate Care (Stage I on buttocks)                       Personal Care Assistance Level of Assistance  Bathing, Feeding, Dressing Bathing Assistance: Limited assistance Feeding assistance: Limited assistance Dressing Assistance: Limited assistance     Functional Limitations Info  Hearing   Hearing Info: Impaired      SPECIAL CARE FACTORS FREQUENCY  PT (By licensed PT), OT (By licensed OT)     PT Frequency: 5x/week OT Frequency: 5x/week            Contractures Contractures Info: Not present    Additional Factors Info  Code Status, Allergies, Isolation Precautions Code Status Info: Partial Allergies Info: NKA     Isolation Precautions Info: Tested COVID + on 01/24/22, so can be off precautions     Current Medications (02/07/2022):  This is the current hospital active medication list Current Facility-Administered Medications  Medication Dose Route Frequency Provider Last Rate Last Admin   acetaminophen (TYLENOL) tablet 650 mg  650 mg Oral Q6H PRN Thurnell Lose, MD   650 mg at 02/07/22 0820   amLODipine (NORVASC) tablet 5 mg  5 mg Oral Daily Thurnell Lose, MD  5 mg at 02/07/22 0819   ceftaroline (TEFLARO) 400 mg in sodium chloride 0.9 % 100 mL IVPB  400 mg Intravenous Q8H Mignon Pine, DO 100 mL/hr at 02/07/22 0639 400 mg at 02/07/22 3710   Chlorhexidine Gluconate Cloth 2 % PADS 6 each  6 each Topical Daily Kipp Brood, MD   6 each at 02/06/22 1238   citalopram (CELEXA) tablet 10 mg  10 mg Oral q AM Lacinda Axon, MD   10 mg at 02/07/22 0618   DAPTOmycin (CUBICIN) 500 mg in sodium chloride 0.9 % IVPB  8 mg/kg Intravenous Q2000 Mignon Pine, DO 120 mL/hr at 02/06/22 1433 500 mg at 02/06/22 1433   dexamethasone (DECADRON) injection 6 mg  6 mg Intravenous Q24H Gleason, Otilio Carpen, PA-C   6 mg at 02/06/22 2149   docusate sodium (COLACE) capsule 100 mg  100 mg Oral BID PRN Gleason, Otilio Carpen, PA-C       guaiFENesin (ROBITUSSIN) 100 MG/5ML liquid 5 mL  5 mL Oral Q4H PRN Lacinda Axon, MD   5 mL at 02/05/22 0845   heparin injection 5,000 Units  5,000 Units Subcutaneous Q8H Gleason, Otilio Carpen, PA-C   5,000 Units at 02/07/22 6269   ipratropium-albuterol (DUONEB) 0.5-2.5 (3) MG/3ML nebulizer solution 3 mL  3 mL Nebulization Q6H PRN Lacinda Axon, MD       levothyroxine (SYNTHROID) tablet 50 mcg  50 mcg Oral QAC breakfast Gleason, Otilio Carpen, PA-C   50 mcg at 02/07/22 0618   LORazepam (ATIVAN) tablet 1 mg  1 mg Oral QHS PRN Lacinda Axon, MD   1 mg at 02/02/22 2124   nystatin (MYCOSTATIN/NYSTOP) topical powder   Topical TID Lacinda Axon, MD   Given at 02/07/22 [phone]   Oral care mouth rinse  15 mL Mouth Rinse PRN Thurnell Lose, MD       pantoprazole (PROTONIX) EC tablet 40 mg  40 mg Oral Daily Lacinda Axon, MD   40 mg at 02/07/22 0820   polyethylene glycol (MIRALAX / GLYCOLAX) packet 17 g  17 g Oral Daily PRN Gleason, Otilio Carpen, PA-C       tamsulosin [facility]) capsule 0.8 mg  0.8 mg Oral QPC supper Lacinda Axon, MD   0.8 mg at 02/06/22 2140   traMADol (ULTRAM) tablet 50 mg  50 mg Oral Q12H PRN Thurnell Lose, MD         Discharge Medications: Please see discharge summary for a list of discharge medications.  Relevant Imaging Results:  Relevant Lab Results:   Additional Information SSN: [id number] [place], [place]

## 2022-02-07 NOTE — Progress Notes (Signed)
Orthopedic Tech Progress Note Patient Details:  Allen Wheeler 12/10/1919 [id number]  Ortho Devices Type of Ortho Device: Thoracolumbar corset (TLSO) Ortho Device/Splint Location: BACK Ortho Device/Splint Interventions: Ordered, Application, Adjustment, Removal   Post Interventions Patient Tolerated: Fair, Well Instructions Provided: Care of device  Janit Pagan 02/07/2022, 10:40 AM

## 2022-02-07 NOTE — TOC Progression Note (Addendum)
Transition of Care [facility]) - Progression Note    Patient Details  Name: Allen Wheeler MRN: [id number] Date of Birth: October 06, 1919  Transition of Care [facility]) CM/SW [place], LCSW Phone Number: 02/07/2022, 12:13 PM  Clinical Narrative:    12pm-CSW left voicemail for [facility] to review the referral that was emailed to them.   3pm-CSW received message from patient's son that they are requesting [facility]. CSW contacted Peggy there and she stated they have a private room for patient when ready. CSW to contact [facility] tomorrow with an update.     Expected Discharge Plan: [place] Barriers to Discharge: Continued Medical Work up, SNF Pending bed offer  Expected Discharge Plan and Services Expected Discharge Plan: [place] In-house Referral: Clinical Social Work   Post Acute Care Choice: [place] Living arrangements for the past 2 months: Single Family Home                                       Social Determinants of Health (SDOH) Interventions    Readmission Risk Interventions     No data to display

## 2022-02-07 NOTE — Progress Notes (Addendum)
PROGRESS NOTE                                                                                                                                                                                                             Patient Demographics:    Allen Wheeler, is a 88 y.o. male, DOB - September 20, 1919, WER:[id number]  Outpatient Primary MD for the patient is [name], MD    LOS - 7  Admit date - 01/31/2022    Chief Complaint  Patient presents with   Code Sepsis       Brief Narrative (HPI from H&P)   88 y.o. M with PMH of GERD, depression/anxiety and discharged home yesterday after being hospitalized with Covid PNA.   He recently visited [place] to celebrate his 89th birthday and became ill after traveling home.  He was admitted 10/22-10/23 and treated with Molnupavir.  After discharge home, family states he initially seemed to be doing well, however slept most of the day today and was increasingly lethargic and high fever, in the ER he was found to have bacterial right lower lobe pneumonia, further work-up showed MRSA bacteremia, in the ER he also had a run of V. tach.  He was admitted by PCCM, in ICU he had Foley catheter placement by urology, he was stabilized and transferred to my care on 02/04/2022 on day 4 of hospital stay.   Subjective:   Patient in bed appears to be in no distress denies any headache chest or abdominal pain, dull low back pain, no weakness in arms or legs.  No shortness of breath.   Assessment  & Plan :   Severe sepsis, MRSA bacteremia after recent episode of COVID-pneumonia likely due to bacterial infection likely due to aspiration pneumonia caused at home. he rightly got weak and deconditioned due to recent COVID-19 infection and aspirated, developed bacterial pneumonia and MRSA bacteremia.  Has been seen by ID and PCCM, initially required BiPAP, now stable on 2 L nasal cannula oxygen, request speech to  evaluate.  Antibiotics per ID. Echocardiogram does not show any evidence of endocarditis.  Abscess pathophysiology has resolved.  Acute hypoxic respiratory failure due to pneumonia as above.  Much improved.  Initially required BiPAP now on nasal cannula, have speech evaluate, antibiotics for bacterial pneumonia, steroids for recent COVID infection.  Speech to evaluate  to rule out ongoing aspiration.  Monitor closely.  Overall improving.  V.Tach in the ER.  Likely due to sepsis.  Resolved after amiodarone.  Avoiding beta-blocker or nodal agents due to underlying history of bradycardia and heart block.  Monitor electrolytes.  Junctional bradycardia with possible history of underlying second-degree AV block Mobitz type I.  Avoiding nodal agents.  Not a candidate for pacemaker due to bacteremia.  TSH is stable.  Continue to monitor on telemetry.  Blood pressure stable.  She had cardiology input discussed with Dr. Harrington Challenger on 02/06/2022.  No further work-up or intervention.  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  Gentle oral Lasix x1 on 02/04/2022, placed on low-dose hydralazine and Imdur if blood pressure permits, avoiding ACE/ARB due to recent AKI, no beta-blocker due to bradycardia and possible underlying second-degree AV block Mobitz type I.  Foley catheter trauma in ICU causing hematuria, underlying history of BPH.  Foley catheter in ICU was pulled due to patient's delirium, urology was consulted they have placed a Foley catheter on 02/03/2022.  Continue Flomax likely will be discharged home with Foley catheter with outpatient urology follow-up.  AKI.  Resolved.  Hypothyroidism.  On Synthroid continue at home dose, TSH stable.  Hypertension.  Placed on low-dose Norvasc and monitor.  Dull low back pain.  Ongoing for 1 to 2 days.  Patient actually unsure about the duration.  MRI obtained in the setting of bacteremia, possible small L1 fracture with nonspecific surrounding  edema, discussed with neurosurgeon Dr. Kathyrn Sheriff who will review the MRI and give [place] a call back.  Most likely not consistent with infection.  Continue supportive care for now, will add TLSO brace when getting out of the bed.  Addendum DW Dr. Kathyrn Sheriff, reviews his MRI, no infection, agrees with TLSO when out of the bed.       Condition - Extremely Guarded  Family Communication  :    Son on [name] [phone] 02/04/22, 02/06/2022, 02/07/2022 at 7:49 AM message left  Code Status :  No CPR or intubation, discussed with son on 02/04/2022, 02/06/22.  Consults  : PCCM, cardiology, urology, neurosurgeon Dr. Kathyrn Sheriff over the phone on 02/07/2022  PUD Prophylaxis :  PPI   Procedures  :     MRI - 1. Acute to subacute compression fracture involving the inferior aspect of L1 with mild 10% height loss without bony retropulsion. 2. Underlying multilevel degenerative disc disease and facet hypertrophy with resultant mild to moderate spinal stenosis at L2-3 through L4-5, with mild-to-moderate multilevel foraminal narrowing as above. 3. Mild edema within the right psoas muscle, favored to be secondary to the L1 compression fracture, with infection felt to be unlikely. No other evidence for acute infection elsewhere within the lumbar spine. No collections. 4. Aneurysmal dilatation of the infrarenal aorta up to 3.9 cm. Recommend follow-up Wheeler 2 years. This recommendation follows ACR consensus guidelines: White Paper of the ACR Incidental Findings Committee II on Vascular Findings. J Am Coll Radiol 2013; 10:789-794. 5. Mild distension of the urinary bladder with a Foley catheter in place. Bedside check to ensure adequate placement and functioning suggested  TTE - 1. Left ventricular ejection fraction, by estimation, is 40 to 45%. The left ventricle has mildly decreased function. The left ventricle demonstrates global hypokinesis. There is moderate asymmetric left ventricular hypertrophy of the septal segment.  Left ventricular diastolic parameters are consistent with Grade I diastolic dysfunction (impaired relaxation).  2. Right ventricular systolic function is normal. The right  ventricular size is normal.  3. Left atrial size was mildly dilated.  4. The mitral valve is normal in structure. Mild mitral valve regurgitation. No evidence of mitral stenosis.  5. The aortic valve is calcified. There is moderate calcification of the aortic valve. There is mild thickening of the aortic valve. Aortic valve regurgitation is not visualized. Aortic valve sclerosis/calcification is present, without any evidence of aortic stenosis. Aortic valve area, by VTI measures 2.00 cm. Aortic valve mean gradient measures 8.0 mmHg. Aortic valve Vmax measures 1.96 m/s.  6. The inferior vena cava is normal in size with greater than 50% respiratory variability, suggesting right atrial pressure of 3 mmHg.      Disposition Plan  :  Status is: Inpatient  DVT Prophylaxis  :    heparin injection 5,000 Units Start: 01/31/22 2200 SCDs Start: 01/31/22 2105   Lab Results  Component Value Date   PLT 197 02/07/2022    Diet :  Diet Order             Diet regular Room service appropriate? Yes with Assist; Fluid consistency: Thin  Diet effective now                    Inpatient Medications  Scheduled Meds:  amLODipine  5 mg Oral Daily   Chlorhexidine Gluconate Cloth  6 each Topical Daily   citalopram  10 mg Oral q AM   dexamethasone (DECADRON) injection  6 mg Intravenous Q24H   heparin  5,000 Units Subcutaneous Q8H   levothyroxine  50 mcg Oral QAC breakfast   nystatin   Topical TID   pantoprazole  40 mg Oral Daily   tamsulosin  0.8 mg Oral QPC supper   Continuous Infusions:  ceFTAROline (TEFLARO) IV 400 mg (02/07/22 2585)   DAPTOmycin (CUBICIN) 500 mg in sodium chloride 0.9 % IVPB 500 mg (02/06/22 1433)   PRN Meds:.acetaminophen, docusate sodium, guaiFENesin, ipratropium-albuterol, LORazepam, mouth rinse,  polyethylene glycol, traMADol    Objective:   Vitals:   02/06/22 2000 02/07/22 0042 02/07/22 0400 02/07/22 0500  BP: 131/68 125/64 105/60   Pulse: (!) 47 (!) 55 (!) 42   Resp: '18 16 16   '$ Temp: 98 F (36.7 C) 98.5 F (36.9 C) 98.3 F (36.8 C)   TempSrc: Oral Oral Oral   SpO2: 99% 100% 99%   Weight:    62.6 kg  Height:        Wt Readings from Last 3 Encounters:  02/07/22 62.6 kg  05/15/21 70.4 kg  05/12/21 72.1 kg     Intake/Output Summary (Last 24 hours) at 02/07/2022 0744 Last data filed at 02/07/2022 0600 Gross per 24 hour  Intake 506 ml  Output 1400 ml  Net -894 ml     Physical Exam  Awake Alert, No new F.N deficits, Normal affect [place].AT,PERRAL Supple Neck, No JVD,   Symmetrical Chest wall movement, Good air movement bilaterally, CTAB RRR,No Gallops, Rubs or new Murmurs,  +ve B.Sounds, Abd Soft, No tenderness,   No Cyanosis, Clubbing or edema      RN pressure injury documentation: Pressure Injury 02/01/22 Buttocks Right;Left Stage 1 -  Intact skin with non-blanchable redness of a localized area usually over a bony prominence. (Active)  02/01/22 0055  Location: Buttocks  Location Orientation: Right;Left  Staging: Stage 1 -  Intact skin with non-blanchable redness of a localized area usually over a bony prominence.  Wound Description (Comments):   Present on Admission:   Dressing Type Foam -  Lift dressing to assess site Wheeler shift 02/06/22 2000      Data Review:    CBC Recent Labs  Lab 01/31/22 1822 01/31/22 1857 02/03/22 1043 02/04/22 0603 02/05/22 0334 02/06/22 0348 02/07/22 0600  WBC 9.8   < > 15.7* 11.2* 11.3* 11.1* 10.7*  HGB 12.5*   < > 11.7* 10.9* 10.7* 11.9* 12.0*  HCT 37.4*   < > 34.1* 31.0* 31.2* 34.9* 35.6*  PLT 194   < > 192 210 199 203 197  MCV 94.7   < > 90.7 89.6 90.4 90.9 90.6  MCH 31.6   < > 31.1 31.5 31.0 31.0 30.5  MCHC 33.4   < > 34.3 35.2 34.3 34.1 33.7  RDW 14.3   < > 14.2 13.9 14.1 14.0 14.0  LYMPHSABS 2.1  --   --   0.4* 0.5* 0.6* 0.7  MONOABS 0.2  --   --  0.3 0.5 0.4 0.4  EOSABS 0.0  --   --  0.0 0.0 0.0 0.0  BASOSABS 0.0  --   --  0.0 0.0 0.0 0.0   < > = values in this interval not displayed.    Electrolytes Recent Labs  Lab 01/31/22 1822 01/31/22 1857 01/31/22 2013 01/31/22 2015 02/01/22 0525 02/01/22 0728 02/02/22 0946 02/02/22 1159 02/03/22 0111 02/04/22 0603 02/05/22 0334 02/06/22 0348 02/07/22 0600  NA 137   < >  --   --    < >  --    < >  --  134* 134* 134* 134* 133*  K 4.3   < >  --   --    < >  --    < >  --  3.8 3.8 4.3 4.2 4.3  CL 103  --   --   --    < >  --    < >  --  103 103 103 102 103  CO2 21*  --   --   --    < >  --    < >  --  19* '23 23 22 '$ 19*  GLUCOSE 144*  --   --   --    < >  --    < >  --  128* 144* 148* 129* 125*  BUN 25*  --   --   --    < >  --    < >  --  27* 23 25* 23 23  CREATININE 1.88*  --   --   --    < >  --    < >  --  1.35* 1.14 1.29* 1.12 1.14  CALCIUM 8.7*  --   --   --    < >  --    < >  --  7.9* 7.8* 7.7* 8.1* 8.0*  AST 54*  --   --   --   --   --   --   --   --  '19 18 15 15  '$ ALT 29  --   --   --   --   --   --   --   --  '27 24 22 21  '$ ALKPHOS 73  --   --   --   --   --   --   --   --  48 47 48 49  BILITOT 1.5*  --   --   --   --   --   --   --   --  1.0 0.9 1.0 1.0  ALBUMIN 2.7*  --   --   --   --   --   --   --   --  2.0* 1.9* 2.0* 2.1*  MG  --   --  2.0  --    < >  --    < >  --  2.0 2.0 1.9 1.9 1.9  CRP  --    < > 17.4*  --   --   --   --   --  30.4* 13.2* 10.3* 11.0* 12.4*  DDIMER  --   --  16.33*  --   --   --   --   --   --   --   --   --   --   PROCALCITON  --   --  2.24  --   --   --   --   --  1.28 0.49 0.42 0.24  --   LATICACIDVEN 4.4*  --   --  3.7*  --  2.3*  --  1.8  --   --   --   --   --   INR 1.3*  --   --   --   --   --   --   --   --   --   --   --   --   TSH  --   --   --   --   --   --   --   --   --  0.543  --   --   --   HGBA1C 5.9*  --   --   --   --   --   --   --   --   --   --   --   --   BNP  --   --   --   --   --    --   --   --   --  1,035.7* 809.5* 731.9* 478.6*   < > = values in this interval not displayed.    ------------------------------------------------------------------------------------------------------------------ No results for input(s): "CHOL", "HDL", "LDLCALC", "TRIG", "CHOLHDL", "LDLDIRECT" in the last 72 hours.  Lab Results  Component Value Date   HGBA1C 5.9 (H) 01/31/2022    No results for input(s): "TSH", "T4TOTAL", "T3FREE", "THYROIDAB" in the last 72 hours.  Invalid input(s): "FREET3"    Radiology Reports MR LUMBAR SPINE WO CONTRAST  Result Date: 02/07/2022 CLINICAL DATA:  Initial evaluation for low back pain. Infection suspected. EXAM: MRI LUMBAR SPINE WITHOUT CONTRAST TECHNIQUE: Multiplanar, multisequence MR imaging of the lumbar spine was performed. No intravenous contrast was administered. COMPARISON:  Prior radiograph from 08/17/2020. FINDINGS: Segmentation: Standard. Lowest well-formed disc space labeled the L5-S1 level. Alignment: Mild sigmoid scoliosis. 3 mm degenerative retrolisthesis of L1 on L2. Vertebrae: Acute to subacute compression fracture extending through the inferior aspect of L1. Mild 10% central height loss without significant bony retropulsion. Additional compression deformity involving the superior endplate of L4, relatively similar to prior radiograph, and largely chronic in appearance. Vertebral body height otherwise maintained. Bone marrow signal intensity diffusely heterogeneous. No worrisome osseous lesions. Mild discogenic reactive endplate changes noted about the T12-L1 and L5-S1 interspaces. No other abnormal marrow edema. No other findings of acute infection within the lumbar spine. Conus medullaris and cauda equina: Conus extends to the L1 level. Conus and cauda equina appear normal. Paraspinal and other soft tissues: Mild edema within the right psoas  muscle, favored to be reactive due to the adjacent L1 compression fracture. No collections. Aneurysmal  dilatation of the infrarenal aorta up to 3.9 cm. Bilateral common iliac artery aneurysms measure 2.5 cm on the right and 2.4 cm on the left. Bladder is partially distended with a Foley catheter in place. Multiple scattered T2 hyperintense cyst noted about the visualized kidneys, benign in appearance, no follow-up imaging recommended. Disc levels: L1-2: Retrolisthesis with mild circumferential disc bulge. Mild facet spurring. No spinal stenosis. Mild to moderate left L1 foraminal narrowing. Right neural foramina remains patent. L2-3: Degenerative intervertebral disc space narrowing with disc desiccation and diffuse disc bulge. Left-sided reactive endplate spurring. Superimposed left foraminal disc extrusion with slight inferior migration (series 2, image 10). Mild facet and ligament flavum hypertrophy. Resultant mild left greater than right lateral recess stenosis. Mild left L2 foraminal narrowing. Right neural foramen remains patent. L3-4: Disc desiccation with mild diffuse disc bulge. Reactive endplate spurring. Moderate facet and ligament flavum hypertrophy. Resultant moderate spinal stenosis. Mild to moderate right with mild left L3 foraminal narrowing. L4-5: Disc desiccation with mild diffuse disc bulge. Superimposed central disc extrusion with superior migration, eccentric to the right (series 2, image 7). Moderate facet and ligament flavum hypertrophy. Resultant mild canal with moderate bilateral subarticular stenosis. Foramina remain patent. L5-S1: Degenerative intervertebral disc space narrowing with disc desiccation and diffuse disc bulge. Reactive endplate spurring. Mild to moderate facet hypertrophy. No significant spinal stenosis. Mild to moderate bilateral L5 foraminal narrowing. IMPRESSION: 1. Acute to subacute compression fracture involving the inferior aspect of L1 with mild 10% height loss without bony retropulsion. 2. Underlying multilevel degenerative disc disease and facet hypertrophy with  resultant mild to moderate spinal stenosis at L2-3 through L4-5, with mild-to-moderate multilevel foraminal narrowing as above. 3. Mild edema within the right psoas muscle, favored to be secondary to the L1 compression fracture, with infection felt to be unlikely. No other evidence for acute infection elsewhere within the lumbar spine. No collections. 4. Aneurysmal dilatation of the infrarenal aorta up to 3.9 cm. Recommend follow-up Wheeler 2 years. This recommendation follows ACR consensus guidelines: White Paper of the ACR Incidental Findings Committee II on Vascular Findings. J Am Coll Radiol 2013; 10:789-794. 5. Mild distension of the urinary bladder with a Foley catheter in place. Bedside check to ensure adequate placement and functioning suggested. Electronically Signed   By: Jeannine Boga M.D.   On: 02/07/2022 04:56   DG Chest Port 1 View  Result Date: 02/04/2022 CLINICAL DATA:  Shortness of breath EXAM: PORTABLE CHEST 1 VIEW COMPARISON:  January 31, 2022 FINDINGS: Opacity in the right lung has mildly worsened in the interval now extending from the base into the mid lung. Haziness over the right base could represent layering effusion. Infiltrate in the left base appears worsened as well. No other interval changes. IMPRESSION: 1. The right-sided infiltrate is worsened in the interval. The left-sided infiltrate is mildly worsened as well. Possible small layering right effusion. No other changes. Electronically Signed   By: Dorise Bullion III M.D.   On: 02/04/2022 07:50      Signature  Lala Lund M.D on 02/07/2022 at 7:44 AM   -  To page go to [email or web address]

## 2022-02-08 ENCOUNTER — Other Ambulatory Visit (HOSPITAL_COMMUNITY): Payer: Medicare Other

## 2022-02-08 ENCOUNTER — Inpatient Hospital Stay (HOSPITAL_COMMUNITY): Payer: Medicare Other

## 2022-02-08 DIAGNOSIS — B9562 Methicillin resistant Staphylococcus aureus infection as the cause of diseases classified elsewhere: Secondary | ICD-10-CM | POA: Diagnosis not present

## 2022-02-08 DIAGNOSIS — R7881 Bacteremia: Secondary | ICD-10-CM | POA: Diagnosis not present

## 2022-02-08 LAB — COMPREHENSIVE METABOLIC PANEL
ALT: 19 U/L (ref 0–44)
AST: 18 U/L (ref 15–41)
Albumin: 2 g/dL — ABNORMAL LOW (ref 3.5–5.0)
Alkaline Phosphatase: 46 U/L (ref 38–126)
Anion gap: 10 (ref 5–15)
BUN: 22 mg/dL (ref 8–23)
CO2: 21 mmol/L — ABNORMAL LOW (ref 22–32)
Calcium: 8 mg/dL — ABNORMAL LOW (ref 8.9–10.3)
Chloride: 102 mmol/L (ref 98–111)
Creatinine, Ser: 1.22 mg/dL (ref 0.61–1.24)
GFR, Estimated: 53 mL/min — ABNORMAL LOW (ref >60)
Glucose, Bld: 132 mg/dL — ABNORMAL HIGH (ref 70–99)
Potassium: 5.1 mmol/L (ref 3.5–5.1)
Sodium: 133 mmol/L — ABNORMAL LOW (ref 135–145)
Total Bilirubin: 0.5 mg/dL (ref 0.3–1.2)
Total Protein: 5.5 g/dL — ABNORMAL LOW (ref 6.5–8.1)

## 2022-02-08 LAB — CBC WITH DIFFERENTIAL/PLATELET
Abs Immature Granulocytes: 0.2 10*3/uL — ABNORMAL HIGH (ref 0.00–0.07)
Basophils Absolute: 0 10*3/uL (ref 0.0–0.1)
Basophils Relative: 0 %
Eosinophils Absolute: 0 10*3/uL (ref 0.0–0.5)
Eosinophils Relative: 0 %
HCT: 36.6 % — ABNORMAL LOW (ref 39.0–52.0)
Hemoglobin: 12.1 g/dL — ABNORMAL LOW (ref 13.0–17.0)
Immature Granulocytes: 2 %
Lymphocytes Relative: 11 %
Lymphs Abs: 1.1 10*3/uL (ref 0.7–4.0)
MCH: 30.5 pg (ref 26.0–34.0)
MCHC: 33.1 g/dL (ref 30.0–36.0)
MCV: 92.2 fL (ref 80.0–100.0)
Monocytes Absolute: 0.4 10*3/uL (ref 0.1–1.0)
Monocytes Relative: 4 %
Neutro Abs: 7.8 10*3/uL — ABNORMAL HIGH (ref 1.7–7.7)
Neutrophils Relative %: 83 %
Platelets: 213 10*3/uL (ref 150–400)
RBC: 3.97 MIL/uL — ABNORMAL LOW (ref 4.22–5.81)
RDW: 14.2 % (ref 11.5–15.5)
WBC: 9.5 10*3/uL (ref 4.0–10.5)
nRBC: 0 % (ref 0.0–0.2)

## 2022-02-08 LAB — PROCALCITONIN: Procalcitonin: 0.1 ng/mL

## 2022-02-08 NOTE — Progress Notes (Signed)
[facility]  Date of Admission:  01/31/2022           Reason for visit: Follow up on MRSA bacteremia  Current antibiotics: Daptomycin and ceftaroline 10/31-pres Vancomycin 10/25-10/30  ASSESSMENT:    88 y.o. male admitted with:  #MRSA bacteremia #Recent COVID-19 infection  Patient recently admitted with COVID-19 pneumonia who is admitted now with suspected postviral bacterial pneumonia with secondary MRSA bacteremia given presentation with hypoxia and worsened airspace disease.  His pulmonary status has overall improved since admission, however, his bacteremia remains persistent with continued positive blood cultures on 02/07/2022.  Initial positive cultures were on 10/25.  Of note, he did have negative blood cultures during his prior admission from 01/28/2022.  There has been no other obvious localizing source of infection.  TTE was initially negative.  An MRI of his lumbar spine given his complaint of back pain a couple days ago was negative.  Today, he seems more focused on quality at the end of his life and comfort.  He states that if it is his time to go, then he would rather be at home on the couch than in the hospital.  RECOMMENDATIONS:    Continue daptomycin and ceftaroline Will obtain repeat blood cultures today  Will check TTE to see if any changes have developed since prior echocardiogram.  At this time, I do not think a TEE will change what we are doing Agree with palliative care consultation to discuss goals of care If focuses to transition towards comfort and home, we would be comfortable with discharging on linezolid oral therapy x2 more weeks We will follow with you   Principal Problem:   MRSA bacteremia Active Problems:   Pneumonia due to COVID-19 virus   Respiratory failure ([place])   Pressure injury of skin   Hypoxia    MEDICATIONS:    Scheduled Meds:  amLODipine  5 mg Oral Daily   Chlorhexidine Gluconate Cloth  6 each  Topical Daily   citalopram  10 mg Oral q AM   heparin  5,000 Units Subcutaneous Q8H   levothyroxine  50 mcg Oral QAC breakfast   nystatin   Topical TID   pantoprazole  40 mg Oral Daily   tamsulosin  0.8 mg Oral QPC supper   Continuous Infusions:  ceFTAROline (TEFLARO) IV 400 mg (02/08/22 0526)   DAPTOmycin (CUBICIN) 500 mg in sodium chloride 0.9 % IVPB 500 mg (02/07/22 1951)   PRN Meds:.acetaminophen, docusate sodium, guaiFENesin, ipratropium-albuterol, LORazepam, mouth rinse, polyethylene glycol, traMADol  SUBJECTIVE:   24 hour events:  No acute events Has been afebrile States he has been reading his book.  He has no pain.  He understands that we are trying our best to clear this infection but at this point it has not worked.  He seems more focused on comfort at this time.   Review of Systems  All other systems reviewed and are negative.     OBJECTIVE:   Blood pressure (!) 112/50, pulse 63, temperature 98 F (36.7 C), temperature source Oral, resp. rate 17, height '5\' 6"'$  (1.676 m), weight 63.6 kg, SpO2 95 %. Body mass index is 22.63 kg/m.  Physical Exam Constitutional:      Appearance: Normal appearance.     Comments: Hard of hearing but is alert and oriented and very conversant  HENT:     Head: Normocephalic and atraumatic.  Eyes:     Extraocular Movements: Extraocular movements intact.  Conjunctiva/sclera: Conjunctivae normal.  Abdominal:     General: There is no distension.     Palpations: Abdomen is soft.  Musculoskeletal:        General: Normal range of motion.     Cervical back: Normal range of motion and neck supple.  Skin:    General: Skin is warm and dry.  Neurological:     General: No focal deficit present.     Mental Status: He is alert and oriented to person, place, and time.  Psychiatric:        Mood and Affect: Mood normal.        Behavior: Behavior normal.      Lab Results: Lab Results  Component Value Date   WBC 9.5 02/08/2022   HGB  12.1 (L) 02/08/2022   HCT 36.6 (L) 02/08/2022   MCV 92.2 02/08/2022   PLT 213 02/08/2022    Lab Results  Component Value Date   NA 133 (L) 02/08/2022   K 5.1 02/08/2022   CO2 21 (L) 02/08/2022   GLUCOSE 132 (H) 02/08/2022   BUN 22 02/08/2022   CREATININE 1.22 02/08/2022   CALCIUM 8.0 (L) 02/08/2022   GFRNONAA 53 (L) 02/08/2022   GFRAA 54 (L) 07/16/2016    Lab Results  Component Value Date   ALT 19 02/08/2022   AST 18 02/08/2022   ALKPHOS 46 02/08/2022   BILITOT 0.5 02/08/2022       Component Value Date/Time   CRP 12.4 (H) 02/07/2022 0600    No results found for: "ESRSEDRATE"   I have reviewed the micro and lab results in Epic.  Imaging: MR LUMBAR SPINE WO CONTRAST  Result Date: 02/07/2022 CLINICAL DATA:  Initial evaluation for low back pain. Infection suspected. EXAM: MRI LUMBAR SPINE WITHOUT CONTRAST TECHNIQUE: Multiplanar, multisequence MR imaging of the lumbar spine was performed. No intravenous contrast was administered. COMPARISON:  Prior radiograph from 08/17/2020. FINDINGS: Segmentation: Standard. Lowest well-formed disc space labeled the L5-S1 level. Alignment: Mild sigmoid scoliosis. 3 mm degenerative retrolisthesis of L1 on L2. Vertebrae: Acute to subacute compression fracture extending through the inferior aspect of L1. Mild 10% central height loss without significant bony retropulsion. Additional compression deformity involving the superior endplate of L4, relatively similar to prior radiograph, and largely chronic in appearance. Vertebral body height otherwise maintained. Bone marrow signal intensity diffusely heterogeneous. No worrisome osseous lesions. Mild discogenic reactive endplate changes noted about the T12-L1 and L5-S1 interspaces. No other abnormal marrow edema. No other findings of acute infection within the lumbar spine. Conus medullaris and cauda equina: Conus extends to the L1 level. Conus and cauda equina appear normal. Paraspinal and other soft  tissues: Mild edema within the right psoas muscle, favored to be reactive due to the adjacent L1 compression fracture. No collections. Aneurysmal dilatation of the infrarenal aorta up to 3.9 cm. Bilateral common iliac artery aneurysms measure 2.5 cm on the right and 2.4 cm on the left. Bladder is partially distended with a Foley catheter in place. Multiple scattered T2 hyperintense cyst noted about the visualized kidneys, benign in appearance, no follow-up imaging recommended. Disc levels: L1-2: Retrolisthesis with mild circumferential disc bulge. Mild facet spurring. No spinal stenosis. Mild to moderate left L1 foraminal narrowing. Right neural foramina remains patent. L2-3: Degenerative intervertebral disc space narrowing with disc desiccation and diffuse disc bulge. Left-sided reactive endplate spurring. Superimposed left foraminal disc extrusion with slight inferior migration (series 2, image 10). Mild facet and ligament flavum hypertrophy. Resultant mild left greater than right lateral  recess stenosis. Mild left L2 foraminal narrowing. Right neural foramen remains patent. L3-4: Disc desiccation with mild diffuse disc bulge. Reactive endplate spurring. Moderate facet and ligament flavum hypertrophy. Resultant moderate spinal stenosis. Mild to moderate right with mild left L3 foraminal narrowing. L4-5: Disc desiccation with mild diffuse disc bulge. Superimposed central disc extrusion with superior migration, eccentric to the right (series 2, image 7). Moderate facet and ligament flavum hypertrophy. Resultant mild canal with moderate bilateral subarticular stenosis. Foramina remain patent. L5-S1: Degenerative intervertebral disc space narrowing with disc desiccation and diffuse disc bulge. Reactive endplate spurring. Mild to moderate facet hypertrophy. No significant spinal stenosis. Mild to moderate bilateral L5 foraminal narrowing. IMPRESSION: 1. Acute to subacute compression fracture involving the inferior  aspect of L1 with mild 10% height loss without bony retropulsion. 2. Underlying multilevel degenerative disc disease and facet hypertrophy with resultant mild to moderate spinal stenosis at L2-3 through L4-5, with mild-to-moderate multilevel foraminal narrowing as above. 3. Mild edema within the right psoas muscle, favored to be secondary to the L1 compression fracture, with infection felt to be unlikely. No other evidence for acute infection elsewhere within the lumbar spine. No collections. 4. Aneurysmal dilatation of the infrarenal aorta up to 3.9 cm. Recommend follow-up every 2 years. This recommendation follows ACR consensus guidelines: White Paper of the ACR Incidental Findings Committee II on Vascular Findings. J Am Coll Radiol 2013; 10:789-794. 5. Mild distension of the urinary bladder with a Foley catheter in place. Bedside check to ensure adequate placement and functioning suggested. Electronically Signed   By: Jeannine Boga M.D.   On: 02/07/2022 04:56     Imaging independently reviewed in Epic.    [facility] [phone] pager 02/08/2022, 12:39 PM  I have personally spent 50 minutes involved in face-to-face and non-face-to-face activities for this patient on the day of the visit. Professional time spent includes the following activities: Preparing to see the patient (review of tests), Obtaining and/or reviewing separately obtained history (admission/discharge record), Performing a medically appropriate examination and/or evaluation , Ordering medications/tests/procedures, referring and communicating with other health care professionals, Documenting clinical information in the EMR, Independently interpreting results (not separately reported), Communicating results to the patient/family/caregiver, Counseling and educating the patient/family/caregiver and Care coordination (not separately reported).

## 2022-02-08 NOTE — Progress Notes (Signed)
PROGRESS NOTE  Allen Wheeler  DOB: [birth year]  PCP: [name], MD LGX:[id number]  DOA: 01/31/2022  LOS: 8 days  Hospital Day: 9  Brief narrative: Allen Wheeler is a 88 y.o. male with PMH significant for GERD, depression/anxiety who was recently hospitalized 10/22 -10/24 for respiratory failure due to COVID pneumonia, treated with molnupiravir and was discharged to home to complete the course.  Per family, after the discharge, he initially seemed to be doing well but within 24 hours, started to be more lethargic, started having fever again and hence he was brought to the ED on 10/25. Work-up showed chest x-ray with right lower lobe pneumonia, blood culture positive for MRSA While in the ED, he also had a run of torsades He needed to be on BiPAP was admitted to ICU, stabilized and transferred out to [facility] on 10/29 See below for details  Subjective: Patient was seen and examined this morning.  Pleasant elderly Caucasian male.  Propped up in bed.  Significantly hard of hearing.  Not in pain or distress.  Seems to be having some depression thought.  Stated multiple times that he is already 88 year old and sees no chance of getting better.   Chart reviewed In the last 24 hours, no fever, heart rate in 40s and 50s, blood pressure in 120s, currently on 4 L oxygen by nasal cannula. Last set of blood work from this morning with WBC count normal, hemoglobin 12.1  Assessment and plan: Severe sepsis secondary to right lower lobe pneumonia Recent COVID-pneumonia Recently hospitalized for COVID-pneumonia completed course of molnupiravir.  Patient probably aspirated due to generalized weakness and developed bacterial pneumonia.   ID following.  Antibiotics per ID Remains on IV dexamethasone 6 mg.  10-day course to complete today.  I will stop steroids at this time. Recent Labs  Lab 02/02/22 1159 02/03/22 0111 02/04/22 0603 02/05/22 0334 02/06/22 0348 02/07/22 0600 02/08/22 0302  02/08/22 0643  WBC  --    < > 11.2* 11.3* 11.1* 10.7*  --  9.5  LATICACIDVEN 1.8  --   --   --   --   --   --   --   PROCALCITON  --    < > 0.49 0.42 0.24 0.18 0.10  --    < > = values in this interval not displayed.   MRSA bacteremia  Persistent bacteremia Blood cultures sent on his last hospitalization 10/22 was negative.   Blood culture sent on admission on 10/25 grew MRSA.  He was started on IV vancomycin despite which, repeat blood culture on 10/27 continues to show MRSA. 10/31, ID switch antibiotics to IV daptomycin and IV ceftaroline. It seems the last 2 blood culture reports from 10/30 and 11/1 continue to show MRSA.  Defer to ID for antibiotic choice or change. 10/26, TTE did not show any evidence of endocarditis.  Defer to ID for TEE indication   Acute hypoxic respiratory failure due to pneumonia  Initially required BiPAP, now on 4 L by nasal cannula.  Wean down as tolerated SLP evaluation   Torsades Occurred in the ED.  Cardiology consult appreciated.  Likely due to sepsis.  Resolved with amiodarone.   Avoiding beta-blocker or nodal agents due to underlying history of bradycardia and heart block.   Monitor electrolytes. Recent Labs  Lab 02/03/22 0111 02/04/22 0603 02/05/22 0334 02/06/22 0348 02/07/22 0600 02/08/22 0302  K 3.8 3.8 4.3 4.2 4.3 5.1  MG 2.0 2.0 1.9 1.9 1.9  --  Conduction disease  Patient has continuous bradycardia.  No history of LV.  No significant pauses noted in limited.  Remains asymptomatic  Avoiding nodal agents.  Not a candidate for pacemaker due to bacteremia.  TSH is stable.  Continue to monitor on telemetry.  Blood pressure stable.    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  Gentle oral Lasix x1 on 10/2 Currently blood pressure is controlled on amlodipine 5 mg daily.   Avoiding beta-blocker due to bradycardia, ACE/ARB due to AKI   Hematuria  BPH  While  delirious, patient pulled Foley catheter in ICU causing hematuria.  Urology was consulted and Foley catheter was placed on 10/28.  May need Foley catheter at discharge and urology follow-up as an outpatient. Continue Flomax.   AKI Resolved. Recent Labs    01/30/22 0444 01/31/22 1822 02/01/22 0525 02/02/22 0946 02/03/22 0111 02/04/22 0603 02/05/22 0334 02/06/22 0348 02/07/22 0600 02/08/22 0302  BUN 22 25* 23 28* 27* 23 25* '23 23 22  '$ CREATININE 1.24 1.88* 1.50* 1.48* 1.35* 1.14 1.29* 1.12 1.14 1.22   Hypothyroidism Continue Synthroid  Dull low back pain  10/31 MRI lumbar spine was obtained in the setting of bacteremia, possible small L1 fracture with nonspecific surrounding edema.  Previous hospitalist discussed with neurosurgeon Dr. Kathyrn Sheriff who mentioned that the finding was not consistent with infection.  TLSO brace recommended out of bed.  Continue supportive care.    Goals of care   Code Status: Partial Code  Palliative care consulted  Mobility: Encourage ambulation once clinically improved  Skin assessment:  Pressure Injury 02/01/22 Buttocks Right;Left Stage 1 -  Intact skin with non-blanchable redness of a localized area usually over a bony prominence. (Active)  02/01/22 0055  Location: Buttocks  Location Orientation: Right;Left  Staging: Stage 1 -  Intact skin with non-blanchable redness of a localized area usually over a bony prominence.  Wound Description (Comments):   Present on Admission:     Nutritional status:  Body mass index is 22.63 kg/m.          Diet:  Diet Order             Diet regular Room service appropriate? Yes with Assist; Fluid consistency: Thin  Diet effective now                   DVT prophylaxis:  heparin injection 5,000 Units Start: 01/31/22 2200 SCDs Start: 01/31/22 2105   Antimicrobials: Teflaro, daptomycin Fluid: None Consultants: ID Family Communication: None at bedside  Status is: Inpatient  Continue  in-hospital care because: Continues to remain bacteremic Level of care: Progressive   Dispo: The patient is from: Home              Anticipated d/c is to: Pending clinical course              Patient currently is not medically stable to d/c.   Difficult to place patient No     Infusions:   ceFTAROline (TEFLARO) IV 400 mg (02/08/22 0526)   DAPTOmycin (CUBICIN) 500 mg in sodium chloride 0.9 % IVPB 500 mg (02/07/22 1951)    Scheduled Meds:  amLODipine  5 mg Oral Daily   Chlorhexidine Gluconate Cloth  6 each Topical Daily   citalopram  10 mg Oral q AM   heparin  5,000 Units Subcutaneous Q8H   levothyroxine  50 mcg Oral QAC breakfast   nystatin   Topical TID  pantoprazole  40 mg Oral Daily   tamsulosin  0.8 mg Oral QPC supper    PRN meds: acetaminophen, docusate sodium, guaiFENesin, ipratropium-albuterol, LORazepam, mouth rinse, polyethylene glycol, traMADol   Antimicrobials: Anti-infectives (From admission, onward)    Start     Dose/Rate Route Frequency Ordered Stop   02/06/22 1200  DAPTOmycin (CUBICIN) 500 mg in sodium chloride 0.9 % IVPB        8 mg/kg  63.4 kg 120 mL/hr over 30 Minutes Intravenous Daily 02/06/22 1030     02/06/22 1200  ceftaroline (TEFLARO) 400 mg in sodium chloride 0.9 % 100 mL IVPB        400 mg 100 mL/hr over 60 Minutes Intravenous Every 8 hours 02/06/22 1030     02/04/22 1800  vancomycin (VANCOREADY) IVPB 1250 mg/250 mL  Status:  Discontinued        1,250 mg 166.7 mL/hr over 90 Minutes Intravenous Every 24 hours 02/04/22 0757 02/06/22 1030   02/02/22 0700  vancomycin (VANCOREADY) IVPB 750 mg/150 mL  Status:  Discontinued        750 mg 150 mL/hr over 60 Minutes Intravenous Every 36 hours 01/31/22 2248 02/01/22 0832   02/02/22 0700  vancomycin (VANCOREADY) IVPB 500 mg/100 mL  Status:  Discontinued        500 mg 100 mL/hr over 60 Minutes Intravenous Every 24 hours 02/01/22 0832 02/01/22 0859   02/01/22 1800  ceFEPIme (MAXIPIME) 2 g in sodium  chloride 0.9 % 100 mL IVPB  Status:  Discontinued        2 g 200 mL/hr over 30 Minutes Intravenous Every 24 hours 01/31/22 2248 02/01/22 1113   02/01/22 1800  vancomycin (VANCOREADY) IVPB 500 mg/100 mL  Status:  Discontinued        500 mg 100 mL/hr over 60 Minutes Intravenous Every 24 hours 02/01/22 0859 02/04/22 0757   02/01/22 1800  ceFEPIme (MAXIPIME) 2 g in sodium chloride 0.9 % 100 mL IVPB  Status:  Discontinued        2 g 200 mL/hr over 30 Minutes Intravenous Every 24 hours 02/01/22 1142 02/02/22 1019   01/31/22 2045  remdesivir 100 mg in sodium chloride 0.9 % 100 mL IVPB  Status:  Discontinued        100 mg 200 mL/hr over 30 Minutes Intravenous Daily 01/31/22 2044 01/31/22 2105   01/31/22 1930  vancomycin (VANCOREADY) IVPB 500 mg/100 mL        500 mg 100 mL/hr over 60 Minutes Intravenous  Once 01/31/22 1918 01/31/22 2107   01/31/22 1915  vancomycin (VANCOREADY) IVPB 1500 mg/300 mL  Status:  Discontinued        1,500 mg 150 mL/hr over 120 Minutes Intravenous  Once 01/31/22 1912 01/31/22 1918   01/31/22 1830  ceFEPIme (MAXIPIME) 2 g in sodium chloride 0.9 % 100 mL IVPB        2 g 200 mL/hr over 30 Minutes Intravenous  Once 01/31/22 1823 01/31/22 1915   01/31/22 1830  vancomycin (VANCOCIN) IVPB 1000 mg/200 mL premix  Status:  Discontinued        1,000 mg 200 mL/hr over 60 Minutes Intravenous  Once 01/31/22 1823 01/31/22 2004       Objective: Vitals:   02/08/22 0913 02/08/22 1234  BP:  (!) 112/50  Pulse:  63  Resp:  17  Temp:  98 F (36.7 C)  SpO2: 94% 95%    Intake/Output Summary (Last 24 hours) at 02/08/2022 1302 Last data filed at 02/08/2022  1255 Gross per 24 hour  Intake --  Output 1650 ml  Net -1650 ml   Filed Weights   02/06/22 0435 02/07/22 0500 02/08/22 0500  Weight: 63.4 kg 62.6 kg 63.6 kg   Weight change: 1 kg Body mass index is 22.63 kg/m.   Physical Exam: General exam: Elderly Caucasian male.  Not in physical distress Skin: No rashes, lesions or  ulcers. HEENT: Atraumatic, normocephalic, no obvious bleeding Lungs: Diminished air entry in both bases, no crackles or wheezing. CVS: Regular rate and rhythm, no murmur GI/Abd soft, nontender, nondistended, bowel sound present CNS: Alert, awake, oriented x3 Psychiatry: Sad affect Extremities: No pedal edema, no calf tenderness  Data Review: I have personally reviewed the laboratory data and studies available.  F/u labs ordered Unresulted Labs (From admission, onward)     Start     Ordered   02/14/22 0500  CK  Weekly,   R     Question:  Specimen collection method  Answer:  Lab=Lab collect   02/07/22 1552   02/08/22 1248  Culture, blood (Routine X 2) w Reflex to ID Panel  BLOOD CULTURE X 2,   R (with TIMED occurrences)      02/08/22 1247   02/04/22 0500  CBC  Daily at 5am,   R     Question:  Specimen collection method  Answer:  Lab=Lab collect   02/03/22 0732   02/04/22 0500  CBC with Differential/Platelet  Daily at 5am,   R     Question:  Specimen collection method  Answer:  Lab=Lab collect   02/03/22 1554            Signed, Terrilee Croak, MD [facility] 02/08/2022

## 2022-02-08 NOTE — Progress Notes (Signed)
Modified Barium Swallow Progress Note  Patient Details  Name: Allen Wheeler MRN: [id number] Date of Birth: December 03, 1919  Today's Date: 02/08/2022  Modified Barium Swallow completed.  Full report located under Chart Review in the Imaging Section.  Brief recommendations include the following:  Clinical Impression  Pt presents with pharyngeal dysphagia characterized by a pharyngeal delay. This resulted in penetration (PAS 3, 5) of thin and nectar thick liquids before and during the swallow and trace silent aspiration (PAS 8) of penetrated thin liquids after deglutition. Prompted coughing was effective in expelling aspirated material. Laryngeal invasion consistently occured with consecutive swallows only and was eliminated with pt's prompted use of individual swallows of liquids. A regular texture diet with thin liquids is recommended with observance of swallowing precautions and supervision to ensure pt's observance. SLP will continue to follow pt.   Swallow Evaluation Recommendations       SLP Diet Recommendations: Regular solids;Thin liquid   Liquid Administration via: Cup;No straw   Medication Administration: Whole meds with puree   Supervision: Patient able to self feed;Full supervision/cueing for compensatory strategies   Compensations: Slow rate;Small sips/bites (avoid consecutive swallows)   Postural Changes: Seated upright at 90 degrees   Oral Care Recommendations: Oral care BID      Yaakov Saindon I. Hardin Negus, [place], [place] Office number [phone]  Horton Marshall 02/08/2022,2:40 PM

## 2022-02-08 NOTE — Progress Notes (Addendum)
Physical Therapy Treatment Patient Details Name: Allen Wheeler MRN: [id number] DOB: 08/12/1919 Today's Date: 02/08/2022   History of Present Illness Patient is 88 year old male with a recent diagnosis of COVID-pneumonia, treated, discharged home then reutrned the following day due to increased lethargy. Pt in junctional bradycardia, with intermittent runs of VT and  was hypoxic and placed on nonrebreather; Pt with acute hypoxic rspoiratory failure, sever sepsis wiht acute metabolic encephalopahthy due to PNA. Pt with low back pain and MRI on 11/1 showed L1 compression fx with 10% loss of height.    PT Comments    Pt making slow, steady progress. Continue to recommend SNF at DC for continued rehab. Pt now with TLSO due to L1 compression fx with brace difficult to manage and will increase difficulty of mobility. Spoke with attending physician and changed the order to lumbar corset.  Recommendations for follow up therapy are one component of a multi-disciplinary discharge planning process, led by the attending physician.  Recommendations may be updated based on patient status, additional functional criteria and insurance authorization.  Follow Up Recommendations  Skilled nursing-short term rehab (<3 hours/day) Can patient physically be transported by private vehicle: Yes   Assistance Recommended at Discharge Frequent or constant Supervision/Assistance  Patient can return home with the following A little help with walking and/or transfers;A little help with bathing/dressing/bathroom;Assistance with cooking/housework;Assist for transportation;Help with stairs or ramp for entrance   Equipment Recommendations  None recommended by PT    Recommendations for Other Services       Precautions / Restrictions Precautions Precautions: Fall;Back Precaution Comments: Monitor O2, covid+ Required Braces or Orthoses: Spinal Brace Spinal Brace: Thoracolumbosacral orthotic;Applied in sitting  position Restrictions Weight Bearing Restrictions: No     Mobility  Bed Mobility Overal bed mobility: Needs Assistance Bed Mobility: Sidelying to Sit   Sidelying to sit: Mod assist, HOB elevated       General bed mobility comments: Assist to elevate trunk into sitting    Transfers Overall transfer level: Needs assistance Equipment used: Rollator (4 wheels) Transfers: Sit to/from Stand Sit to Stand: Min assist           General transfer comment: Assist to bring hips up and for balance.    Ambulation/Gait Ambulation/Gait assistance: Min assist, +2 safety/equipment Gait Distance (Feet): 30 Feet Assistive device: Rollator (4 wheels) Gait Pattern/deviations: Step-through pattern, Decreased stride length, Trunk flexed Gait velocity: decr Gait velocity interpretation: <1.31 ft/sec, indicative of household ambulator   General Gait Details: Assist for balance   Stairs             Wheelchair Mobility    Modified Rankin (Stroke Patients Only)       Balance Overall balance assessment: Needs assistance Sitting-balance support: Feet supported Sitting balance-Leahy Scale: Good     Standing balance support: Single extremity supported, During functional activity Standing balance-Leahy Scale: Poor Standing balance comment: UE support for static standing                            Cognition Arousal/Alertness: Awake/alert Behavior During Therapy: WFL for tasks assessed/performed Overall Cognitive Status: Within Functional Limits for tasks assessed                                 General Comments: tangential but likely at baseline        Exercises      General Comments General  comments (skin integrity, edema, etc.): Pt on 2L O2 with SpO2 90% or above      Pertinent Vitals/Pain Pain Assessment Pain Assessment: Faces Faces Pain Scale: Hurts little more Pain Location: back Pain Descriptors / Indicators: Grimacing,  Guarding Pain Intervention(s): Limited activity within patient's tolerance, Premedicated before session, Repositioned    Home Living                          Prior Function            PT Goals (current goals can now be found in the care plan section) Progress towards PT goals: Progressing toward goals    Frequency    Min 2X/week      PT Plan Discharge plan needs to be updated;Frequency needs to be updated    Co-evaluation              AM-PAC PT "6 Clicks" Mobility   Outcome Measure  Help needed turning from your back to your side while in a flat bed without using bedrails?: A Little Help needed moving from lying on your back to sitting on the side of a flat bed without using bedrails?: A Lot Help needed moving to and from a bed to a chair (including a wheelchair)?: A Little Help needed standing up from a chair using your arms (e.g., wheelchair or bedside chair)?: A Little Help needed to walk in hospital room?: A Little Help needed climbing 3-5 steps with a railing? : Total 6 Click Score: 15    End of Session Equipment Utilized During Treatment: Oxygen;Back brace Activity Tolerance: Patient limited by fatigue Patient left: in chair;with call bell/phone within reach;with family/visitor present Nurse Communication: Mobility status PT Visit Diagnosis: Unsteadiness on feet (R26.81);Other abnormalities of gait and mobility (R26.89);Muscle weakness (generalized) (M62.81);Difficulty in walking, not elsewhere classified (R26.2)     Time: 1660-6301 PT Time Calculation (min) (ACUTE ONLY): 31 min  Charges:  $Gait Training: 23-37 mins                     [place] Office [place] 02/08/2022, 5:40 PM

## 2022-02-08 NOTE — Progress Notes (Signed)
Orthopedic Tech Progress Note Patient Details:  Allen Wheeler 1919/12/02 [id number]  LSO will be replacing the TLSO. New brace is adjusted and at bedside. Pt is currently in bed asleep and call button is within reach, rise and fall of chest observed, vitals showing on the monitor are the following: BP 122/92, HR 54, SpO2 fluctuating between 92-94%.  Ortho Devices Type of Ortho Device: Lumbar corsett Ortho Device/Splint Location: adjusted and at bedside for use when OOB Ortho Device/Splint Interventions: Adjustment   Post Interventions Patient Tolerated: Fair, Well Instructions Provided: Care of device  [name] [name] 02/08/2022, 6:56 PM

## 2022-02-08 NOTE — Progress Notes (Signed)
[facility] - BLOOD CULTURE   Allen Wheeler is an 88 y.o. male who presented to [facility] on 01/31/2022 with a chief complaint of hypoxia and history of COVID hospitalization and now has MRSA bacteremia. Repeat Cultures are now 3/4 for gram positive cocci in clusters. WBC 7.8, afebrile.   Name of physician (or Provider) Contacted: Dr. Nevada Crane   Current antibiotics: Daptomycin 500 mg IV QD + ceftaroline '400mg'$  IV q 8h   Changes to prescribed antibiotics recommended:  Patient is on recommended antibiotics - No changes needed   Wilson Singer, PharmD Clinical Pharmacist 02/08/2022 7:23 PM

## 2022-02-08 NOTE — Progress Notes (Addendum)
Speech Language Pathology Treatment: Dysphagia  Patient Details Name: Allen Wheeler MRN: [id number] DOB: [birth year] Today's Date: 02/08/2022 Time: 3614-4315 SLP Time Calculation (min) (ACUTE ONLY): 13 min  Assessment / Plan / Recommendation Clinical Impression  Pt was seen during part of lunch for dysphagia treatment. He was alert and cooperative during the session and pt's RN denied the pt having any difficulty with meals. Pt was consuming a meal of carrots, roast beef, mashed potatoes, and thin liquids upon SLP's entry. He exhibited coughing twice between boluses, but other boluses were tolerated without overt s/s of aspiration. Mastication and oral clearance continue to be [facility]. Considering concern for aspiration being contributory to his pneumonia, and his presentation, a modified barium swallow study will be conducted to further assess swallow function; pt verbalized agreement.  The session was abbreviated to allow pt's transport to radiology.   HPI HPI: Pt is a 88 year old male with a recent diagnosis of COVID-pneumonia, treated, discharged home then reutrned the following day due to increased lethargy. CXR 10/29: right-sided infiltrate is worsened in the interval. The  left-sided infiltrate is mildly worsened as well. Pt with acute hypoxic respiratory failure, sever sepsis with acute metabolic encephalopahthy due to PNA. PMH: GERD, depression/anxiety      SLP Plan  MBS      Recommendations for follow up therapy are one component of a multi-disciplinary discharge planning process, led by the attending physician.  Recommendations may be updated based on patient status, additional functional criteria and insurance authorization.    Recommendations  Diet recommendations: Regular;Thin liquid Liquids provided via: Cup;Straw Medication Administration: Whole meds with liquid Supervision: Patient able to self feed Compensations: Slow rate Postural Changes and/or Swallow Maneuvers: Seated  upright 90 degrees                Oral Care Recommendations: Oral care BID Follow Up Recommendations:  (TBD) Assistance recommended at discharge: PRN SLP Visit Diagnosis: Dysphagia, unspecified (R13.10) Plan: MBS         Bitania Shankland I. Hardin Negus, [place], [place] Office number [phone]   Horton Marshall  02/08/2022, 1:41 PM

## 2022-02-09 ENCOUNTER — Other Ambulatory Visit (HOSPITAL_COMMUNITY): Payer: Medicare Other

## 2022-02-09 DIAGNOSIS — U071 COVID-19: Secondary | ICD-10-CM | POA: Diagnosis not present

## 2022-02-09 DIAGNOSIS — B9562 Methicillin resistant Staphylococcus aureus infection as the cause of diseases classified elsewhere: Secondary | ICD-10-CM | POA: Diagnosis not present

## 2022-02-09 DIAGNOSIS — J9601 Acute respiratory failure with hypoxia: Secondary | ICD-10-CM | POA: Diagnosis not present

## 2022-02-09 DIAGNOSIS — R7881 Bacteremia: Secondary | ICD-10-CM | POA: Diagnosis not present

## 2022-02-09 LAB — CULTURE, BLOOD (ROUTINE X 2)
Special Requests: ADEQUATE
Special Requests: ADEQUATE

## 2022-02-09 MED ORDER — ACETAMINOPHEN 325 MG PO TABS
650.0000 mg | ORAL_TABLET | Freq: Two times a day (BID) | ORAL | Status: DC
  Administered 2022-02-09 – 2022-02-16 (×14): 650 mg via ORAL
  Filled 2022-02-09 (×14): qty 2

## 2022-02-09 NOTE — Progress Notes (Signed)
PROGRESS NOTE  CLAYBORNE DIVIS  DOB: [birth year]  PCP: [name], MD TMH:[id number]  DOA: 01/31/2022  LOS: 9 days  Hospital Day: 10  Brief narrative: Allen Wheeler is a 88 y.o. male with PMH significant for GERD, depression/anxiety who was recently hospitalized 10/22 -10/24 for respiratory failure due to COVID pneumonia, treated with molnupiravir and was discharged to home to complete the course.  Per family, after the discharge, he initially seemed to be doing well but within 24 hours, started to be more lethargic, started having fever again and hence he was brought to the ED on 10/25. Work-up showed chest x-ray with right lower lobe pneumonia, blood culture positive for MRSA While in the ED, he also had a run of torsades He needed to be on BiPAP was admitted to ICU, stabilized and transferred out to [facility] on 10/29 See below for details  Subjective: Patient was seen and examined this morning.  Lying down in bed.  Not in distress.  Not in pain.  On low-flow oxygen.  Pending echocardiogram today.  Assessment and plan: Severe sepsis secondary to right lower lobe pneumonia Recent COVID-pneumonia Recently hospitalized for COVID-pneumonia completed course of molnupiravir.  Patient probably aspirated due to generalized weakness and developed bacterial pneumonia.   ID following.  Antibiotics per ID Completed 10-day course of dexamethasone. Recent Labs  Lab 02/04/22 0603 02/05/22 0334 02/06/22 0348 02/07/22 0600 02/08/22 0302 02/08/22 0643  WBC 11.2* 11.3* 11.1* 10.7*  --  9.5  PROCALCITON 0.49 0.42 0.24 0.18 0.10  --    MRSA bacteremia  Persistent bacteremia Blood cultures sent on his last hospitalization 10/22 was negative.   Blood culture sent on admission on 10/25 grew MRSA.  He was started on IV vancomycin despite which, repeat blood culture on 10/27 continues to show MRSA. 10/31, ID switch antibiotics to IV daptomycin and IV ceftaroline. It seems the last 2 blood  culture reports from 10/30 and 11/1 continue to show MRSA.  Defer to ID for antibiotic choice or change. 10/26, TTE did not show any evidence of endocarditis.  Repeat TTE ordered, pending today.  Not a candidate for TEE   Acute hypoxic respiratory failure due to pneumonia  Initially required BiPAP, now on 4 L by nasal cannula.  Wean down as tolerated SLP evaluation   Torsades Occurred in the ED.  Cardiology consult appreciated.  Likely due to sepsis.  Resolved with amiodarone.   Avoiding beta-blocker or nodal agents due to underlying history of bradycardia and heart block.   Monitor electrolytes. Recent Labs  Lab 02/03/22 0111 02/04/22 0603 02/05/22 0334 02/06/22 0348 02/07/22 0600 02/08/22 0302  K 3.8 3.8 4.3 4.2 4.3 5.1  MG 2.0 2.0 1.9 1.9 1.9  --     Conduction disease  Patient has continuous bradycardia.  No history of LV.  No significant pauses noted in limited.  Remains asymptomatic  Avoiding nodal agents.  Not a candidate for pacemaker due to bacteremia.  TSH is stable.  Continue to monitor on telemetry.  Blood pressure stable.    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  Gentle oral Lasix x1 on 10/2 Currently blood pressure is controlled on amlodipine 5 mg daily.   Avoiding beta-blocker due to bradycardia, ACE/ARB due to AKI   Hematuria  BPH  While delirious, patient pulled Foley catheter in ICU causing hematuria.  Urology was consulted and Foley catheter was placed on 10/28.  May need Foley catheter  at discharge and urology follow-up as an outpatient. Continue Flomax.   AKI Resolved. Recent Labs    01/30/22 0444 01/31/22 1822 02/01/22 0525 02/02/22 0946 02/03/22 0111 02/04/22 0603 02/05/22 0334 02/06/22 0348 02/07/22 0600 02/08/22 0302  BUN 22 25* 23 28* 27* 23 25* '23 23 22  '$ CREATININE 1.24 1.88* 1.50* 1.48* 1.35* 1.14 1.29* 1.12 1.14 1.22   Hypothyroidism Continue Synthroid  Dull  low back pain  10/31 MRI lumbar spine was obtained in the setting of bacteremia, possible small L1 fracture with nonspecific surrounding edema.  Previous hospitalist discussed with neurosurgeon Dr. Kathyrn Sheriff who mentioned that the finding was not consistent with infection.  Lumbar corset ordered.  Continue supportive care  Goals of care   Code Status: Partial Code  Palliative care consult called.  Mobility: Encourage ambulation once clinically improved  Skin assessment:  Pressure Injury 02/01/22 Buttocks Right;Left Stage 1 -  Intact skin with non-blanchable redness of a localized area usually over a bony prominence. (Active)  02/01/22 0055  Location: Buttocks  Location Orientation: Right;Left  Staging: Stage 1 -  Intact skin with non-blanchable redness of a localized area usually over a bony prominence.  Wound Description (Comments):   Present on Admission:     Nutritional status:  Body mass index is 22.63 kg/m.          Diet:  Diet Order             Diet regular Room service appropriate? Yes with Assist; Fluid consistency: Thin  Diet effective now                   DVT prophylaxis:  heparin injection 5,000 Units Start: 01/31/22 2200 SCDs Start: 01/31/22 2105   Antimicrobials: Teflaro, daptomycin Fluid: None Consultants: ID Family Communication: None at bedside  Status is: Inpatient  Continue in-hospital care because: Continues to remain bacteremic Level of care: Progressive   Dispo: The patient is from: Home              Anticipated d/c is to: Pending clinical course              Patient currently is not medically stable to d/c.   Difficult to place patient No     Infusions:   ceFTAROline (TEFLARO) IV 400 mg (02/09/22 1244)   DAPTOmycin (CUBICIN) 500 mg in sodium chloride 0.9 % IVPB 500 mg (02/08/22 2123)    Scheduled Meds:  acetaminophen  650 mg Oral BID   amLODipine  5 mg Oral Daily   Chlorhexidine Gluconate Cloth  6 each Topical Daily    citalopram  10 mg Oral q AM   heparin  5,000 Units Subcutaneous Q8H   levothyroxine  50 mcg Oral QAC breakfast   nystatin   Topical TID   pantoprazole  40 mg Oral Daily   tamsulosin  0.8 mg Oral QPC supper    PRN meds: docusate sodium, guaiFENesin, ipratropium-albuterol, LORazepam, mouth rinse, polyethylene glycol, traMADol   Antimicrobials: Anti-infectives (From admission, onward)    Start     Dose/Rate Route Frequency Ordered Stop   02/06/22 1200  DAPTOmycin (CUBICIN) 500 mg in sodium chloride 0.9 % IVPB        8 mg/kg  63.4 kg 120 mL/hr over 30 Minutes Intravenous Daily 02/06/22 1030     02/06/22 1200  ceftaroline (TEFLARO) 400 mg in sodium chloride 0.9 % 100 mL IVPB        400 mg 100 mL/hr over 60 Minutes Intravenous Every 8 hours  02/06/22 1030     02/04/22 1800  vancomycin (VANCOREADY) IVPB 1250 mg/250 mL  Status:  Discontinued        1,250 mg 166.7 mL/hr over 90 Minutes Intravenous Every 24 hours 02/04/22 0757 02/06/22 1030   02/02/22 0700  vancomycin (VANCOREADY) IVPB 750 mg/150 mL  Status:  Discontinued        750 mg 150 mL/hr over 60 Minutes Intravenous Every 36 hours 01/31/22 2248 02/01/22 0832   02/02/22 0700  vancomycin (VANCOREADY) IVPB 500 mg/100 mL  Status:  Discontinued        500 mg 100 mL/hr over 60 Minutes Intravenous Every 24 hours 02/01/22 0832 02/01/22 0859   02/01/22 1800  ceFEPIme (MAXIPIME) 2 g in sodium chloride 0.9 % 100 mL IVPB  Status:  Discontinued        2 g 200 mL/hr over 30 Minutes Intravenous Every 24 hours 01/31/22 2248 02/01/22 1113   02/01/22 1800  vancomycin (VANCOREADY) IVPB 500 mg/100 mL  Status:  Discontinued        500 mg 100 mL/hr over 60 Minutes Intravenous Every 24 hours 02/01/22 0859 02/04/22 0757   02/01/22 1800  ceFEPIme (MAXIPIME) 2 g in sodium chloride 0.9 % 100 mL IVPB  Status:  Discontinued        2 g 200 mL/hr over 30 Minutes Intravenous Every 24 hours 02/01/22 1142 02/02/22 1019   01/31/22 2045  remdesivir 100 mg in sodium  chloride 0.9 % 100 mL IVPB  Status:  Discontinued        100 mg 200 mL/hr over 30 Minutes Intravenous Daily 01/31/22 2044 01/31/22 2105   01/31/22 1930  vancomycin (VANCOREADY) IVPB 500 mg/100 mL        500 mg 100 mL/hr over 60 Minutes Intravenous  Once 01/31/22 1918 01/31/22 2107   01/31/22 1915  vancomycin (VANCOREADY) IVPB 1500 mg/300 mL  Status:  Discontinued        1,500 mg 150 mL/hr over 120 Minutes Intravenous  Once 01/31/22 1912 01/31/22 1918   01/31/22 1830  ceFEPIme (MAXIPIME) 2 g in sodium chloride 0.9 % 100 mL IVPB        2 g 200 mL/hr over 30 Minutes Intravenous  Once 01/31/22 1823 01/31/22 1915   01/31/22 1830  vancomycin (VANCOCIN) IVPB 1000 mg/200 mL premix  Status:  Discontinued        1,000 mg 200 mL/hr over 60 Minutes Intravenous  Once 01/31/22 1823 01/31/22 2004       Objective: Vitals:   02/09/22 0800 02/09/22 1200  BP: 135/72 111/67  Pulse: (!) 55 (!) 56  Resp: 18   Temp: 97.8 F (36.6 C) 98.6 F (37 C)  SpO2: 100% 98%    Intake/Output Summary (Last 24 hours) at 02/09/2022 1547 Last data filed at 02/09/2022 0500 Gross per 24 hour  Intake --  Output 1050 ml  Net -1050 ml   Filed Weights   02/06/22 0435 02/07/22 0500 02/08/22 0500  Weight: 63.4 kg 62.6 kg 63.6 kg   Weight change:  Body mass index is 22.63 kg/m.   Physical Exam: General exam: Elderly Caucasian male.  Not in physical distress Skin: No rashes, lesions or ulcers. HEENT: Atraumatic, normocephalic, no obvious bleeding Lungs: Clear to auscultation bilaterally CVS: Regular rate and rhythm, no murmur GI/Abd soft, nontender, nondistended, bowel sound present CNS: Alert, awake, oriented x3 Psychiatry: Sad affect Extremities: No pedal edema, no calf tenderness  Data Review: I have personally reviewed the laboratory data and studies available.  F/u labs ordered Unresulted Labs (From admission, onward)     Start     Ordered   02/14/22 0500  CK  Weekly,   R     Question:  Specimen  collection method  Answer:  Lab=Lab collect   02/07/22 1552   02/05/22 0447  Miscellaneous [facility] test (send-out)  Once,   R        02/05/22 0447   02/04/22 0500  CBC  Daily at 5am,   R     Question:  Specimen collection method  Answer:  Lab=Lab collect   02/03/22 0732   02/04/22 0500  CBC with Differential/Platelet  Daily at 5am,   R     Question:  Specimen collection method  Answer:  Lab=Lab collect   02/03/22 1554            Signed, Terrilee Croak, MD [facility] 02/09/2022

## 2022-02-09 NOTE — TOC Progression Note (Signed)
Transition of Care [facility]) - Progression Note    Patient Details  Name: Allen Wheeler MRN: [id number] Date of Birth: 1920-02-14  Transition of Care [facility]) CM/SW Daniels, LCSW Phone Number: 02/09/2022, 3:39 PM  Clinical Narrative:    CSW made [facility] aware that patient will not be ready over the weekend.    Expected Discharge Plan: [place] Barriers to Discharge: Continued Medical Work up  Expected Discharge Plan: [place] In-house Referral: Clinical Social Work   Post Acute Care Choice: [place] Living arrangements for the past 2 months: Single Family Home                                       Social Determinants of Health (SDOH) Interventions    Readmission Risk Interventions     No data to display

## 2022-02-09 NOTE — Progress Notes (Signed)
Mobility Specialist Progress Note:   02/09/22 0900  Mobility  Activity  (bed mobility)  Assistive Device None  Activity Response Tolerated fair  Mobility Referral Yes  $Mobility charge 1 Mobility   Pt declining OOB mobility at this time d/t back pain. Agreeable to bed mobility only. Pt left in bed with all needs met.   Nelta Numbers Acute Rehab Secure Chat or Office Phone: [phone]

## 2022-02-09 NOTE — Care Management Important Message (Signed)
Important Message  Patient Details  Name: Allen Wheeler MRN: [id number] Date of Birth: November 15, 1919   Medicare Important Message Given:  Yes     Hannah Beat 02/09/2022, 2:06 PM

## 2022-02-09 NOTE — Progress Notes (Signed)
PHARMACY - PHYSICIAN COMMUNICATION CRITICAL VALUE ALERT - BLOOD CULTURE IDENTIFICATION (BCID)  Allen Wheeler is an 88 y.o. male who presented to [facility] on 01/31/2022  with a chief complaint of hypoxia and history of COVID hospitalization and now has MRSA bacteremia   Assessment:  Blood cultures from 11/2 show GPC/clusters  Current antibiotics: daptomycin and ceftaroline  Changes to prescribed antibiotics recommended:  Patient is on recommended antibiotics - No changes needed  Results for orders placed or performed during the hospital encounter of 01/31/22  Blood Culture ID Panel (Reflexed) (Collected: 01/31/2022  6:22 PM)  Result Value Ref Range   Enterococcus faecalis NOT DETECTED NOT DETECTED   Enterococcus Faecium NOT DETECTED NOT DETECTED   Listeria monocytogenes NOT DETECTED NOT DETECTED   Staphylococcus species DETECTED (A) NOT DETECTED   Staphylococcus aureus (BCID) DETECTED (A) NOT DETECTED   Staphylococcus epidermidis NOT DETECTED NOT DETECTED   Staphylococcus lugdunensis NOT DETECTED NOT DETECTED   Streptococcus species NOT DETECTED NOT DETECTED   Streptococcus agalactiae NOT DETECTED NOT DETECTED   Streptococcus pneumoniae NOT DETECTED NOT DETECTED   Streptococcus pyogenes NOT DETECTED NOT DETECTED   A.calcoaceticus-baumannii NOT DETECTED NOT DETECTED   Bacteroides fragilis NOT DETECTED NOT DETECTED   Enterobacterales NOT DETECTED NOT DETECTED   Enterobacter cloacae complex NOT DETECTED NOT DETECTED   Escherichia coli NOT DETECTED NOT DETECTED   Klebsiella aerogenes NOT DETECTED NOT DETECTED   Klebsiella oxytoca NOT DETECTED NOT DETECTED   Klebsiella pneumoniae NOT DETECTED NOT DETECTED   Proteus species NOT DETECTED NOT DETECTED   Salmonella species NOT DETECTED NOT DETECTED   Serratia marcescens NOT DETECTED NOT DETECTED   Haemophilus influenzae NOT DETECTED NOT DETECTED   Neisseria meningitidis NOT DETECTED NOT DETECTED   Pseudomonas aeruginosa NOT  DETECTED NOT DETECTED   Stenotrophomonas maltophilia NOT DETECTED NOT DETECTED   Candida albicans NOT DETECTED NOT DETECTED   Candida auris NOT DETECTED NOT DETECTED   Candida glabrata NOT DETECTED NOT DETECTED   Candida krusei NOT DETECTED NOT DETECTED   Candida parapsilosis NOT DETECTED NOT DETECTED   Candida tropicalis NOT DETECTED NOT DETECTED   Cryptococcus neoformans/gattii NOT DETECTED NOT DETECTED   Meth resistant mecA/C and MREJ DETECTED (A) NOT DETECTED    Hildred Laser, PharmD Clinical Pharmacist **Pharmacist phone directory can now be found on [email or web address] ([email or web address]).  Listed under [place].

## 2022-02-09 NOTE — Progress Notes (Signed)
[facility]  Date of Admission:  01/31/2022           Reason for visit: Follow up on MRSA bacteremia  Current antibiotics: Daptomycin Ceftaroline    ASSESSMENT:    88 y.o. male admitted with:  # MRSA Bacteremia # Recent COVID 19 infection  Patient recently admitted with COVID-19 pneumonia who is admitted now with suspected postviral bacterial pneumonia with secondary MRSA bacteremia given presentation with hypoxia and worsened airspace disease.  His pulmonary status has overall improved since admission, however, his bacteremia remains persistent with continued positive blood cultures on 02/07/2022.  Initial positive cultures were on 10/25.  Of note, he did have negative blood cultures during his prior admission from 01/28/2022.  There has been no other obvious localizing source of infection.  TTE was initially negative.  An MRI of his lumbar spine given his complaint of back pain a couple days ago was negative. Repeat TTE is pending today.   RECOMMENDATIONS:    Continue daptomycin Cotninue ceftaroline Await TTE repeat that is planned Follow up 11/2 blood cx Hopefully these will be clear Following   Principal Problem:   MRSA bacteremia Active Problems:   Pneumonia due to COVID-19 virus   Respiratory failure (HCC)   Pressure injury of skin   Hypoxia    MEDICATIONS:    Scheduled Meds:  acetaminophen  650 mg Oral BID   amLODipine  5 mg Oral Daily   Chlorhexidine Gluconate Cloth  6 each Topical Daily   citalopram  10 mg Oral q AM   heparin  5,000 Units Subcutaneous Q8H   levothyroxine  50 mcg Oral QAC breakfast   nystatin   Topical TID   pantoprazole  40 mg Oral Daily   tamsulosin  0.8 mg Oral QPC supper   Continuous Infusions:  ceFTAROline (TEFLARO) IV 400 mg (02/09/22 1244)   DAPTOmycin (CUBICIN) 500 mg in sodium chloride 0.9 % IVPB 500 mg (02/08/22 2123)   PRN Meds:.docusate sodium, guaiFENesin, ipratropium-albuterol,  LORazepam, mouth rinse, polyethylene glycol, traMADol  SUBJECTIVE:   24 hour events:  No acute events Oxygen weaning Afebrile BCx from 11/1 all positive Repeat BCx done today Remains on Dapto/Ceftaroline  No new complaints Asking for Tylenol to be scheduled as it eases his back pain He is otherwise doing okay   Review of Systems  All other systems reviewed and are negative.     OBJECTIVE:   Blood pressure 111/67, pulse (!) 56, temperature 98.6 F (37 C), temperature source Axillary, resp. rate 18, height '5\' 6"'$  (1.676 m), weight 63.6 kg, SpO2 98 %. Body mass index is 22.63 kg/m.  Physical Exam Constitutional:      General: He is not in acute distress. HENT:     Head: Normocephalic and atraumatic.  Eyes:     Extraocular Movements: Extraocular movements intact.     Conjunctiva/sclera: Conjunctivae normal.  Pulmonary:     Effort: Pulmonary effort is normal. No respiratory distress.  Abdominal:     General: There is no distension.     Palpations: Abdomen is soft.  Skin:    General: Skin is warm and dry.  Neurological:     General: No focal deficit present.     Mental Status: He is alert.     Comments: Seems a little confused today.  Psychiatric:        Mood and Affect: Mood normal.        Behavior: Behavior normal.  Lab Results: Lab Results  Component Value Date   WBC 9.5 02/08/2022   HGB 12.1 (L) 02/08/2022   HCT 36.6 (L) 02/08/2022   MCV 92.2 02/08/2022   PLT 213 02/08/2022    Lab Results  Component Value Date   NA 133 (L) 02/08/2022   K 5.1 02/08/2022   CO2 21 (L) 02/08/2022   GLUCOSE 132 (H) 02/08/2022   BUN 22 02/08/2022   CREATININE 1.22 02/08/2022   CALCIUM 8.0 (L) 02/08/2022   GFRNONAA 53 (L) 02/08/2022   GFRAA 54 (L) 07/16/2016    Lab Results  Component Value Date   ALT 19 02/08/2022   AST 18 02/08/2022   ALKPHOS 46 02/08/2022   BILITOT 0.5 02/08/2022       Component Value Date/Time   CRP 12.4 (H) 02/07/2022 0600    No  results found for: "ESRSEDRATE"   I have reviewed the micro and lab results in Epic.  Imaging: DG Swallowing Func-Speech Pathology  Result Date: 02/08/2022 Table formatting from the original result was not included. Objective Swallowing Evaluation: Type of Study: MBS-Modified Barium Swallow Study  Patient Details Name: ELEK HOLDERNESS MRN: [id number] Date of Birth: [birth year] Today's Date: 02/08/2022 Time: SLP Start Time (ACUTE ONLY): 1401 -SLP Stop Time (ACUTE ONLY): 1425 SLP Time Calculation (min) (ACUTE ONLY): 24 min Past Medical History: Past Medical History: Diagnosis Date  Allergy   Anxiety   Chickenpox   Depression   Dizziness, nonspecific   loss of balance last few months  GERD (gastroesophageal reflux disease)   Hearing loss   wears hearing aids  Nephrolithiasis   Normal cardiac stress test 09-20-12  Osteoarthritis  Past Surgical History: Past Surgical History: Procedure Laterality Date  CATARACT EXTRACTION  2017  CHOLECYSTECTOMY N/A 02/18/2016  Procedure: LAPAROSCOPIC CHOLECYSTECTOMY WITH POSSIBLE INTRAOPERATIVE CHOLANGIOGRAM;  Surgeon: Mickeal Skinner, MD;  Location: [facility];  Service: General;  Laterality: N/A;  ERCP N/A 02/16/2016  Procedure: ENDOSCOPIC RETROGRADE CHOLANGIOPANCREATOGRAPHY (ERCP);  Surgeon: Teena Irani, MD;  Location: [facility] ENDOSCOPY;  Service: Endoscopy;  Laterality: N/A;  HERNIA REPAIR    wears truss  INGUINAL HERNIA REPAIR Right 06/21/2016  Procedure: OPEN RIGHT INGUINAL HERNIA REPAIR;  Surgeon: [name] Kinsinger, MD;  Location: [facility];  Service: General;  Laterality: Right;  INSERTION OF MESH Right 06/21/2016  Procedure: INSERTION OF MESH;  Surgeon: [name] Kinsinger, MD;  Location: [facility];  Service: General;  Laterality: Right;  MASTOIDECTOMY   HPI: Pt is a 88 year old male with a recent diagnosis of COVID-pneumonia, treated, discharged home then reutrned the following day due to increased lethargy. CXR 10/29: right-sided infiltrate is worsened in the interval. The   left-sided infiltrate is mildly worsened as well. Pt with acute hypoxic respiratory failure, sever sepsis with acute metabolic encephalopahthy due to PNA. PMH: GERD, depression/anxiety  No data recorded  Recommendations for follow up therapy are one component of a multi-disciplinary discharge planning process, led by the attending physician.  Recommendations may be updated based on patient status, additional functional criteria and insurance authorization. Assessment / Plan / Recommendation   02/08/2022   2:45 PM Clinical Impressions Clinical Impression Pt presents with pharyngeal dysphagia characterized by a pharyngeal delay. This resulted in penetration (PAS 3, 5) of thin and nectar thick liquids before and during the swallow and trace silent aspiration (PAS 8) of penetrated thin liquids after deglutition. Prompted coughing was effective in expelling aspirated material. Laryngeal invasion consistently occured with consecutive swallows only and was eliminated with pt's prompted use of individual  swallows of liquids. A regular texture diet with thin liquids is recommended with observance of swallowing precautions and supervision to ensure pt's observance. SLP will continue to follow pt. SLP Visit Diagnosis Dysphagia, pharyngeal phase (R13.13) Impact on safety and function Mild aspiration risk     02/08/2022   2:45 PM Treatment Recommendations Treatment Recommendations Therapy as outlined in treatment plan below     02/08/2022   2:45 PM Prognosis Prognosis for Safe Diet Advancement Good   02/08/2022   2:45 PM Diet Recommendations SLP Diet Recommendations Regular solids;Thin liquid Medication Administration Whole meds with puree Compensations Slow rate;Small sips/bites     02/08/2022   2:45 PM Other Recommendations Oral Care Recommendations Oral care BID Follow Up Recommendations No SLP follow up Assistance recommended at discharge PRN Functional Status Assessment Patient has had a recent decline in their functional status  and demonstrates the ability to make significant improvements in function in a reasonable and predictable amount of time.   02/08/2022   2:45 PM Frequency and Duration  Speech Therapy Frequency (ACUTE ONLY) min 1 x/week Treatment Duration 1 week     02/08/2022   1:35 PM Oral Phase Oral Phase [facility]    02/08/2022   2:45 PM Pharyngeal Phase Pharyngeal Material enters airway, remains ABOVE vocal cords and not ejected out Pharyngeal Material enters airway, remains ABOVE vocal cords and not ejected out Pharyngeal Material enters airway, remains ABOVE vocal cords and not ejected out;Material enters airway, CONTACTS cords and not ejected out;Material enters airway, passes BELOW cords without attempt by patient to eject out (silent aspiration) Pharyngeal Material enters airway, remains ABOVE vocal cords and not ejected out;Material enters airway, CONTACTS cords and not ejected out    02/08/2022   1:35 PM Cervical Esophageal Phase  Cervical Esophageal Phase [facility] Shanika I. Hardin Negus, [place], [place] Office number [phone] Horton Marshall 02/08/2022, 2:46 PM                       Imaging independently reviewed in Epic.    [facility] [phone] pager 02/09/2022, 2:28 PM

## 2022-02-10 ENCOUNTER — Inpatient Hospital Stay (HOSPITAL_COMMUNITY): Payer: Medicare Other

## 2022-02-10 DIAGNOSIS — Z515 Encounter for palliative care: Secondary | ICD-10-CM | POA: Diagnosis not present

## 2022-02-10 DIAGNOSIS — Z7189 Other specified counseling: Secondary | ICD-10-CM

## 2022-02-10 DIAGNOSIS — R7881 Bacteremia: Secondary | ICD-10-CM

## 2022-02-10 DIAGNOSIS — B9562 Methicillin resistant Staphylococcus aureus infection as the cause of diseases classified elsewhere: Secondary | ICD-10-CM | POA: Diagnosis not present

## 2022-02-10 LAB — COMPREHENSIVE METABOLIC PANEL
ALT: 19 U/L (ref 0–44)
AST: 15 U/L (ref 15–41)
Albumin: 1.9 g/dL — ABNORMAL LOW (ref 3.5–5.0)
Alkaline Phosphatase: 40 U/L (ref 38–126)
Anion gap: 10 (ref 5–15)
BUN: 21 mg/dL (ref 8–23)
CO2: 21 mmol/L — ABNORMAL LOW (ref 22–32)
Calcium: 8 mg/dL — ABNORMAL LOW (ref 8.9–10.3)
Chloride: 103 mmol/L (ref 98–111)
Creatinine, Ser: 1.27 mg/dL — ABNORMAL HIGH (ref 0.61–1.24)
GFR, Estimated: 50 mL/min — ABNORMAL LOW (ref >60)
Glucose, Bld: 89 mg/dL (ref 70–99)
Potassium: 4.1 mmol/L (ref 3.5–5.1)
Sodium: 134 mmol/L — ABNORMAL LOW (ref 135–145)
Total Bilirubin: 0.6 mg/dL (ref 0.3–1.2)
Total Protein: 5.3 g/dL — ABNORMAL LOW (ref 6.5–8.1)

## 2022-02-10 LAB — CBC WITH DIFFERENTIAL/PLATELET
Abs Immature Granulocytes: 0.21 10*3/uL — ABNORMAL HIGH (ref 0.00–0.07)
Basophils Absolute: 0 10*3/uL (ref 0.0–0.1)
Basophils Relative: 0 %
Eosinophils Absolute: 0 10*3/uL (ref 0.0–0.5)
Eosinophils Relative: 0 %
HCT: 33 % — ABNORMAL LOW (ref 39.0–52.0)
Hemoglobin: 11.1 g/dL — ABNORMAL LOW (ref 13.0–17.0)
Immature Granulocytes: 2 %
Lymphocytes Relative: 14 %
Lymphs Abs: 1.4 10*3/uL (ref 0.7–4.0)
MCH: 30.9 pg (ref 26.0–34.0)
MCHC: 33.6 g/dL (ref 30.0–36.0)
MCV: 91.9 fL (ref 80.0–100.0)
Monocytes Absolute: 0.6 10*3/uL (ref 0.1–1.0)
Monocytes Relative: 6 %
Neutro Abs: 7.6 10*3/uL (ref 1.7–7.7)
Neutrophils Relative %: 78 %
Platelets: 180 10*3/uL (ref 150–400)
RBC: 3.59 MIL/uL — ABNORMAL LOW (ref 4.22–5.81)
RDW: 14.3 % (ref 11.5–15.5)
WBC: 9.8 10*3/uL (ref 4.0–10.5)
nRBC: 0 % (ref 0.0–0.2)

## 2022-02-10 LAB — CULTURE, BLOOD (ROUTINE X 2): Special Requests: ADEQUATE

## 2022-02-10 LAB — ECHOCARDIOGRAM LIMITED
AV Mean grad: 8.5 mmHg
AV Peak grad: 16.3 mmHg
Ao pk vel: 2.02 m/s
Height: 66 in
Weight: 2296.31 oz

## 2022-02-10 LAB — C-REACTIVE PROTEIN: CRP: 13 mg/dL — ABNORMAL HIGH (ref <1.0)

## 2022-02-10 LAB — BRAIN NATRIURETIC PEPTIDE: B Natriuretic Peptide: 209.2 pg/mL — ABNORMAL HIGH (ref 0.0–100.0)

## 2022-02-10 LAB — PROCALCITONIN: Procalcitonin: <0.1 ng/mL

## 2022-02-10 LAB — MAGNESIUM: Magnesium: 1.8 mg/dL (ref 1.7–2.4)

## 2022-02-10 MED ORDER — ACETAMINOPHEN 500 MG PO TABS
500.0000 mg | ORAL_TABLET | Freq: Three times a day (TID) | ORAL | Status: DC | PRN
  Administered 2022-02-10: 500 mg via ORAL
  Filled 2022-02-10: qty 1

## 2022-02-10 NOTE — Progress Notes (Signed)
PROGRESS NOTE                                                                                                                                                                                                             Patient Demographics:    Allen Wheeler, is a 88 y.o. male, DOB - March 21, 1920, LXB:[id number]  Outpatient Primary MD for the patient is [name], MD    LOS - 10  Admit date - 01/31/2022    Chief Complaint  Patient presents with   Code Sepsis       Brief Narrative (HPI from H&P)   88 y.o. M with PMH of GERD, depression/anxiety and discharged home yesterday after being hospitalized with Covid PNA.   He recently visited [place] to celebrate his 89th birthday and became ill after traveling home.  He was admitted 10/22-10/23 and treated with Molnupavir.  After discharge home, family states he initially seemed to be doing well, however slept most of the day today and was increasingly lethargic and high fever, in the ER he was found to have bacterial right lower lobe pneumonia, further work-up showed MRSA bacteremia, in the ER he also had a run of V. tach.  He was admitted by PCCM, in ICU he had Foley catheter placement by urology, he was stabilized and transferred to my care on 02/04/2022 on day 4 of hospital stay.   Subjective:   Patient in bed, no headache chest or abdominal pain, no shortness of breath, dull low blood pain.   Assessment  & Plan :   Severe sepsis, MRSA bacteremia after recent episode of COVID-pneumonia likely due to bacterial infection likely due to aspiration pneumonia caused at home. he rightly got weak and deconditioned due to recent COVID-19 infection and aspirated, developed bacterial pneumonia and now persistent MRSA bacteremia.  Has been seen by ID and PCCM, initially required BiPAP, now stable on 2 L nasal cannula oxygen, request speech to evaluate.  Antibiotics per ID currently on  combination of daptomycin and Teflaro, initial Echocardiogram does not show any evidence of endocarditis sepsis pathophysiology has resolved.  Repeat echo pending.  Acute hypoxic respiratory failure due to pneumonia as above.  Much improved.  Initially required BiPAP now on nasal cannula, have speech evaluate, antibiotics for bacterial pneumonia, steroids for recent COVID infection.  Speech to evaluate to  rule out ongoing aspiration.  Monitor closely.  Overall improving.  V.Tach in the ER.  Likely due to sepsis.  Resolved after amiodarone.  Avoiding beta-blocker or nodal agents due to underlying history of bradycardia and heart block.  Monitor electrolytes.  Junctional bradycardia with possible history of underlying second-degree AV block Mobitz type I.  Avoiding nodal agents.  Not a candidate for pacemaker due to bacteremia.  TSH is stable.  Continue to monitor on telemetry.  Blood pressure stable.  She had cardiology input discussed with Dr. Harrington Challenger on 02/06/2022.  No further work-up or intervention.  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  Gentle oral Lasix x1 on 02/04/2022, placed on low-dose hydralazine and Imdur if blood pressure permits, avoiding ACE/ARB due to recent AKI, no beta-blocker due to bradycardia and possible underlying second-degree AV block Mobitz type I.  Foley catheter trauma in ICU causing hematuria, underlying history of BPH.  Foley catheter in ICU was pulled due to patient's delirium, urology was consulted they have placed a Foley catheter on 02/03/2022.  Continue Flomax likely will be discharged home with Foley catheter with outpatient urology follow-up.  AKI.  Resolved.  Hypothyroidism.  On Synthroid continue at home dose, TSH stable.  Hypertension.  Placed on low-dose Norvasc and monitor.  Dull low back pain.  Ongoing for 1 to 2 days.  Patient actually unsure about the duration.  MRI obtained in the setting of bacteremia, possible small L1  fracture with nonspecific surrounding edema, case discussed with Dr. Kathyrn Sheriff on 02/08/2022, he reviewed the MRI and agrees there is no infection, he agreed for supportive care for now, will add TLSO brace when getting out of the bed.       Condition - Extremely Guarded  Family Communication  :    Son on [name] [phone] 02/04/22, 02/06/2022, 02/07/2022 at 7:49 AM message left  Code Status :  No CPR or intubation, discussed with son on 02/04/2022, 02/06/22.  Consults  : PCCM, cardiology, urology, neurosurgeon Dr. Kathyrn Sheriff over the phone on 02/07/2022  PUD Prophylaxis :  PPI   Procedures  :     MRI - 1. Acute to subacute compression fracture involving the inferior aspect of L1 with mild 10% height loss without bony retropulsion. 2. Underlying multilevel degenerative disc disease and facet hypertrophy with resultant mild to moderate spinal stenosis at L2-3 through L4-5, with mild-to-moderate multilevel foraminal narrowing as above. 3. Mild edema within the right psoas muscle, favored to be secondary to the L1 compression fracture, with infection felt to be unlikely. No other evidence for acute infection elsewhere within the lumbar spine. No collections. 4. Aneurysmal dilatation of the infrarenal aorta up to 3.9 cm. Recommend follow-up every 2 years. This recommendation follows ACR consensus guidelines: White Paper of the ACR Incidental Findings Committee II on Vascular Findings. J Am Coll Radiol 2013; 10:789-794. 5. Mild distension of the urinary bladder with a Foley catheter in place. Bedside check to ensure adequate placement and functioning suggested  TTE - 1. Left ventricular ejection fraction, by estimation, is 40 to 45%. The left ventricle has mildly decreased function. The left ventricle demonstrates global hypokinesis. There is moderate asymmetric left ventricular hypertrophy of the septal segment. Left ventricular diastolic parameters are consistent with Grade I diastolic dysfunction  (impaired relaxation).  2. Right ventricular systolic function is normal. The right ventricular size is normal.  3. Left atrial size was mildly dilated.  4. The mitral valve is normal in structure. Mild mitral valve  regurgitation. No evidence of mitral stenosis.  5. The aortic valve is calcified. There is moderate calcification of the aortic valve. There is mild thickening of the aortic valve. Aortic valve regurgitation is not visualized. Aortic valve sclerosis/calcification is present, without any evidence of aortic stenosis. Aortic valve area, by VTI measures 2.00 cm. Aortic valve mean gradient measures 8.0 mmHg. Aortic valve Vmax measures 1.96 m/s.  6. The inferior vena cava is normal in size with greater than 50% respiratory variability, suggesting right atrial pressure of 3 mmHg.      Disposition Plan  :  Status is: Inpatient  DVT Prophylaxis  :    heparin injection 5,000 Units Start: 01/31/22 2200 SCDs Start: 01/31/22 2105   Lab Results  Component Value Date   PLT 180 02/10/2022    Diet :  Diet Order             Diet regular Room service appropriate? Yes with Assist; Fluid consistency: Thin  Diet effective now                    Inpatient Medications  Scheduled Meds:  acetaminophen  650 mg Oral BID   amLODipine  5 mg Oral Daily   Chlorhexidine Gluconate Cloth  6 each Topical Daily   citalopram  10 mg Oral q AM   heparin  5,000 Units Subcutaneous Q8H   levothyroxine  50 mcg Oral QAC breakfast   nystatin   Topical TID   pantoprazole  40 mg Oral Daily   tamsulosin  0.8 mg Oral QPC supper   Continuous Infusions:  ceFTAROline (TEFLARO) IV 400 mg (02/10/22 0434)   DAPTOmycin (CUBICIN) 500 mg in sodium chloride 0.9 % IVPB 500 mg (02/09/22 2121)   PRN Meds:.docusate sodium, guaiFENesin, ipratropium-albuterol, LORazepam, mouth rinse, polyethylene glycol, traMADol    Objective:   Vitals:   02/10/22 0400 02/10/22 0439 02/10/22 0915 02/10/22 0917  BP: 111/60  (!)  126/106 (!) 126/106  Pulse: (!) 55  69   Resp: 18  20   Temp: 98.3 F (36.8 C)  98 F (36.7 C)   TempSrc: Oral  Oral   SpO2: 97%  99%   Weight:  65.1 kg    Height:        Wt Readings from Last 3 Encounters:  02/10/22 65.1 kg  05/15/21 70.4 kg  05/12/21 72.1 kg     Intake/Output Summary (Last 24 hours) at 02/10/2022 1135 Last data filed at 02/10/2022 0600 Gross per 24 hour  Intake --  Output 1350 ml  Net -1350 ml     Physical Exam  Awake Alert, No new F.N deficits, Normal affect [place].AT,PERRAL Supple Neck, No JVD,   Symmetrical Chest wall movement, Good air movement bilaterally, CTAB RRR,No Gallops, Rubs or new Murmurs,  +ve B.Sounds, Abd Soft, No tenderness,   No Cyanosis, Clubbing or edema      RN pressure injury documentation: Pressure Injury 02/01/22 Buttocks Right;Left Stage 1 -  Intact skin with non-blanchable redness of a localized area usually over a bony prominence. (Active)  02/01/22 0055  Location: Buttocks  Location Orientation: Right;Left  Staging: Stage 1 -  Intact skin with non-blanchable redness of a localized area usually over a bony prominence.  Wound Description (Comments):   Present on Admission:   Dressing Type Foam - Lift dressing to assess site every shift 02/10/22 0915      Data Review:    CBC Recent Labs  Lab 02/05/22 0334 02/06/22 0348 02/07/22 0600 02/08/22  0076 02/10/22 0229  WBC 11.3* 11.1* 10.7* 9.5 9.8  HGB 10.7* 11.9* 12.0* 12.1* 11.1*  HCT 31.2* 34.9* 35.6* 36.6* 33.0*  PLT 199 203 197 213 180  MCV 90.4 90.9 90.6 92.2 91.9  MCH 31.0 31.0 30.5 30.5 30.9  MCHC 34.3 34.1 33.7 33.1 33.6  RDW 14.1 14.0 14.0 14.2 14.3  LYMPHSABS 0.5* 0.6* 0.7 1.1 1.4  MONOABS 0.5 0.4 0.4 0.4 0.6  EOSABS 0.0 0.0 0.0 0.0 0.0  BASOSABS 0.0 0.0 0.0 0.0 0.0    Electrolytes Recent Labs  Lab 02/04/22 0603 02/05/22 0334 02/06/22 0348 02/07/22 0600 02/08/22 0302 02/10/22 0229  NA 134* 134* 134* 133* 133* 134*  K 3.8 4.3 4.2 4.3 5.1 4.1   CL 103 103 102 103 102 103  CO2 '23 23 22 '$ 19* 21* 21*  GLUCOSE 144* 148* 129* 125* 132* 89  BUN 23 25* '23 23 22 21  '$ CREATININE 1.14 1.29* 1.12 1.14 1.22 1.27*  CALCIUM 7.8* 7.7* 8.1* 8.0* 8.0* 8.0*  AST '19 18 15 15 18 15  '$ ALT '27 24 22 21 19 19  '$ ALKPHOS 48 47 48 49 46 40  BILITOT 1.0 0.9 1.0 1.0 0.5 0.6  ALBUMIN 2.0* 1.9* 2.0* 2.1* 2.0* 1.9*  MG 2.0 1.9 1.9 1.9  --  1.8  CRP 13.2* 10.3* 11.0* 12.4*  --  13.0*  PROCALCITON 0.49 0.42 0.24 0.18 0.10 <0.10  TSH 0.543  --   --   --   --   --   BNP 1,035.7* 809.5* 731.9* 478.6*  --  209.2*    ------------------------------------------------------------------------------------------------------------------ No results for input(s): "CHOL", "HDL", "LDLCALC", "TRIG", "CHOLHDL", "LDLDIRECT" in the last 72 hours.  Lab Results  Component Value Date   HGBA1C 5.9 (H) 01/31/2022    No results for input(s): "TSH", "T4TOTAL", "T3FREE", "THYROIDAB" in the last 72 hours.  Invalid input(s): "FREET3"    Radiology Reports DG Chest Port 1 View  Result Date: 02/10/2022 CLINICAL DATA:  Shortness of breath EXAM: PORTABLE CHEST 1 VIEW COMPARISON:  02/04/2022 FINDINGS: Normal heart size. Mediastinal widening primarily from aortic tortuosity, accentuated by scoliosis. Infiltrates at the lung bases are unchanged. No visible effusion or pneumothorax. IMPRESSION: Unchanged bilateral pneumonia. Electronically Signed   By: Jorje Guild M.D.   On: 02/10/2022 08:32   DG Swallowing Func-Speech Pathology  Result Date: 02/08/2022 Table formatting from the original result was not included. Objective Swallowing Evaluation: Type of Study: MBS-Modified Barium Swallow Study  Patient Details Name: Allen Wheeler MRN: [id number] Date of Birth: Mar 09, 1920 Today's Date: 02/08/2022 Time: SLP Start Time (ACUTE ONLY): 1401 -SLP Stop Time (ACUTE ONLY): 1425 SLP Time Calculation (min) (ACUTE ONLY): 24 min Past Medical History: Past Medical History: Diagnosis Date  Allergy    Anxiety   Chickenpox   Depression   Dizziness, nonspecific   loss of balance last few months  GERD (gastroesophageal reflux disease)   Hearing loss   wears hearing aids  Nephrolithiasis   Normal cardiac stress test 09-20-12  Osteoarthritis  Past Surgical History: Past Surgical History: Procedure Laterality Date  CATARACT EXTRACTION  2017  CHOLECYSTECTOMY N/A 02/18/2016  Procedure: LAPAROSCOPIC CHOLECYSTECTOMY WITH POSSIBLE INTRAOPERATIVE CHOLANGIOGRAM;  Surgeon: Mickeal Skinner, MD;  Location: [facility];  Service: General;  Laterality: N/A;  ERCP N/A 02/16/2016  Procedure: ENDOSCOPIC RETROGRADE CHOLANGIOPANCREATOGRAPHY (ERCP);  Surgeon: Teena Irani, MD;  Location: [facility] ENDOS[facility];  Service: Endoscopy;  Laterality: N/A;  HERNIA REPAIR    wears truss  INGUINAL HERNIA REPAIR Right 06/21/2016  Procedure: OPEN RIGHT  INGUINAL HERNIA REPAIR;  Surgeon: [name] Kinsinger, MD;  Location: [facility];  Service: General;  Laterality: Right;  INSERTION OF MESH Right 06/21/2016  Procedure: INSERTION OF MESH;  Surgeon: [name] Kinsinger, MD;  Location: [facility];  Service: General;  Laterality: Right;  MASTOIDECTOMY   HPI: Pt is a 88 year old male with a recent diagnosis of COVID-pneumonia, treated, discharged home then reutrned the following day due to increased lethargy. CXR 10/29: right-sided infiltrate is worsened in the interval. The  left-sided infiltrate is mildly worsened as well. Pt with acute hypoxic respiratory failure, sever sepsis with acute metabolic encephalopahthy due to PNA. PMH: GERD, depression/anxiety  No data recorded  Recommendations for follow up therapy are one component of a multi-disciplinary discharge planning process, led by the attending physician.  Recommendations may be updated based on patient status, additional functional criteria and insurance authorization. Assessment / Plan / Recommendation   02/08/2022   2:45 PM Clinical Impressions Clinical Impression Pt presents with pharyngeal dysphagia characterized  by a pharyngeal delay. This resulted in penetration (PAS 3, 5) of thin and nectar thick liquids before and during the swallow and trace silent aspiration (PAS 8) of penetrated thin liquids after deglutition. Prompted coughing was effective in expelling aspirated material. Laryngeal invasion consistently occured with consecutive swallows only and was eliminated with pt's prompted use of individual swallows of liquids. A regular texture diet with thin liquids is recommended with observance of swallowing precautions and supervision to ensure pt's observance. SLP will continue to follow pt. SLP Visit Diagnosis Dysphagia, pharyngeal phase (R13.13) Impact on safety and function Mild aspiration risk     02/08/2022   2:45 PM Treatment Recommendations Treatment Recommendations Therapy as outlined in treatment plan below     02/08/2022   2:45 PM Prognosis Prognosis for Safe Diet Advancement Good   02/08/2022   2:45 PM Diet Recommendations SLP Diet Recommendations Regular solids;Thin liquid Medication Administration Whole meds with puree Compensations Slow rate;Small sips/bites     02/08/2022   2:45 PM Other Recommendations Oral Care Recommendations Oral care BID Follow Up Recommendations No SLP follow up Assistance recommended at discharge PRN Functional Status Assessment Patient has had a recent decline in their functional status and demonstrates the ability to make significant improvements in function in a reasonable and predictable amount of time.   02/08/2022   2:45 PM Frequency and Duration  Speech Therapy Frequency (ACUTE ONLY) min 1 x/week Treatment Duration 1 week     02/08/2022   1:35 PM Oral Phase Oral Phase [facility]    02/08/2022   2:45 PM Pharyngeal Phase Pharyngeal Material enters airway, remains ABOVE vocal cords and not ejected out Pharyngeal Material enters airway, remains ABOVE vocal cords and not ejected out Pharyngeal Material enters airway, remains ABOVE vocal cords and not ejected out;Material enters airway, CONTACTS  cords and not ejected out;Material enters airway, passes BELOW cords without attempt by patient to eject out (silent aspiration) Pharyngeal Material enters airway, remains ABOVE vocal cords and not ejected out;Material enters airway, CONTACTS cords and not ejected out    02/08/2022   1:35 PM Cervical Esophageal Phase  Cervical Esophageal Phase [facility] Shanika I. Hardin Negus, [place], [place] Office number [phone] Horton Marshall 02/08/2022, 2:46 PM                     MR LUMBAR SPINE WO CONTRAST  Result Date: 02/07/2022 CLINICAL DATA:  Initial evaluation for low back pain. Infection suspected. EXAM: MRI LUMBAR SPINE WITHOUT  CONTRAST TECHNIQUE: Multiplanar, multisequence MR imaging of the lumbar spine was performed. No intravenous contrast was administered. COMPARISON:  Prior radiograph from 08/17/2020. FINDINGS: Segmentation: Standard. Lowest well-formed disc space labeled the L5-S1 level. Alignment: Mild sigmoid scoliosis. 3 mm degenerative retrolisthesis of L1 on L2. Vertebrae: Acute to subacute compression fracture extending through the inferior aspect of L1. Mild 10% central height loss without significant bony retropulsion. Additional compression deformity involving the superior endplate of L4, relatively similar to prior radiograph, and largely chronic in appearance. Vertebral body height otherwise maintained. Bone marrow signal intensity diffusely heterogeneous. No worrisome osseous lesions. Mild discogenic reactive endplate changes noted about the T12-L1 and L5-S1 interspaces. No other abnormal marrow edema. No other findings of acute infection within the lumbar spine. Conus medullaris and cauda equina: Conus extends to the L1 level. Conus and cauda equina appear normal. Paraspinal and other soft tissues: Mild edema within the right psoas muscle, favored to be reactive due to the adjacent L1 compression fracture. No collections. Aneurysmal dilatation of the infrarenal aorta up to  3.9 cm. Bilateral common iliac artery aneurysms measure 2.5 cm on the right and 2.4 cm on the left. Bladder is partially distended with a Foley catheter in place. Multiple scattered T2 hyperintense cyst noted about the visualized kidneys, benign in appearance, no follow-up imaging recommended. Disc levels: L1-2: Retrolisthesis with mild circumferential disc bulge. Mild facet spurring. No spinal stenosis. Mild to moderate left L1 foraminal narrowing. Right neural foramina remains patent. L2-3: Degenerative intervertebral disc space narrowing with disc desiccation and diffuse disc bulge. Left-sided reactive endplate spurring. Superimposed left foraminal disc extrusion with slight inferior migration (series 2, image 10). Mild facet and ligament flavum hypertrophy. Resultant mild left greater than right lateral recess stenosis. Mild left L2 foraminal narrowing. Right neural foramen remains patent. L3-4: Disc desiccation with mild diffuse disc bulge. Reactive endplate spurring. Moderate facet and ligament flavum hypertrophy. Resultant moderate spinal stenosis. Mild to moderate right with mild left L3 foraminal narrowing. L4-5: Disc desiccation with mild diffuse disc bulge. Superimposed central disc extrusion with superior migration, eccentric to the right (series 2, image 7). Moderate facet and ligament flavum hypertrophy. Resultant mild canal with moderate bilateral subarticular stenosis. Foramina remain patent. L5-S1: Degenerative intervertebral disc space narrowing with disc desiccation and diffuse disc bulge. Reactive endplate spurring. Mild to moderate facet hypertrophy. No significant spinal stenosis. Mild to moderate bilateral L5 foraminal narrowing. IMPRESSION: 1. Acute to subacute compression fracture involving the inferior aspect of L1 with mild 10% height loss without bony retropulsion. 2. Underlying multilevel degenerative disc disease and facet hypertrophy with resultant mild to moderate spinal stenosis at  L2-3 through L4-5, with mild-to-moderate multilevel foraminal narrowing as above. 3. Mild edema within the right psoas muscle, favored to be secondary to the L1 compression fracture, with infection felt to be unlikely. No other evidence for acute infection elsewhere within the lumbar spine. No collections. 4. Aneurysmal dilatation of the infrarenal aorta up to 3.9 cm. Recommend follow-up every 2 years. This recommendation follows ACR consensus guidelines: White Paper of the ACR Incidental Findings Committee II on Vascular Findings. J Am Coll Radiol 2013; 10:789-794. 5. Mild distension of the urinary bladder with a Foley catheter in place. Bedside check to ensure adequate placement and functioning suggested. Electronically Signed   By: Jeannine Boga M.D.   On: 02/07/2022 04:56      Signature  Lala Lund M.D on 02/10/2022 at 11:35 AM   -  To page go to [email or web address]

## 2022-02-10 NOTE — Consult Note (Signed)
Palliative Medicine Inpatient Consult Note  Consulting Provider: Dr. Pietro Cassis  Reason for consult:   [facility] Consult  Reason for Consult? [place]   02/10/2022  HPI:  Per intake H&P -->  88 y.o. M with PMH of GERD, depression/anxiety and discharged home yesterday after being hospitalized with Covid PNA.   He recently visited [place] to celebrate his 89th birthday and became ill after traveling home.  He was admitted 10/22-10/23 and treated with Molnupavir.  After discharge home, family states he initially seemed to be doing well, however slept most of the day today and was increasingly lethargic and high fever, in the ER he was found to have bacterial right lower lobe pneumonia, further work-up showed MRSA bacteremia. Has received treatment and continues to be improving.   Clinical Assessment/Goals of Care:  *Please note that this is a verbal dictation therefore any spelling or grammatical errors are due to the "[employer]" system interpretation.  I have reviewed medical records including EPIC notes, labs and imaging, received report from bedside RN, assessed the patient.    I met with [place] son and daughter at bedside to further discuss diagnosis prognosis, GOC, EOL wishes, disposition and options.   I introduced Palliative Medicine as specialized medical care for people living with serious illness. It focuses on providing relief from the symptoms and stress of a serious illness. The goal is to improve quality of life for both the patient and the family. Patients family shared that they do not think palliative support is needed at this time. I clarified what Palliative care is and is not. I shared that Palliative care is not hospice and explained the differences to the patients son.   [name] shares with me that Ellery was in a terrible shape when he first came in due to COVID-19 he expresses that he had been here a few days preceding his present  admission and was discharged prematurely. He expresses that when his father was admitted there was a cluster of other things that he was affected by in discussion of his sepsis.  Discussed code status which is DNAR/DNI. Patients son plans to bring a copy of advance directives to [facility] medical office.   As of presently [name] is very clear that his father has been gradually improving. The hope is to discharge him to a skilled nursing facility on Monday and after he rehabilitates there for him to go home with home health. He presently does not see a role for ongoing Palliative support.   Provided my card in case additional questions/concerns should arise.   Decision Maker: Linc, Renne (Son): [phone] (Home Phone)   SUMMARY OF RECOMMENDATIONS   DNAR/DNI  Patients son plans to bring advance directives to patients PCP  Plan for transition to SNF from here  Patient son does not presently see a need for Palliative support therefore we will sign off  Code Status/Advance Care Planning: DNAR/DNI  Palliative Prophylaxis:  Aspiration, Bowel Regimen, Delirium Protocol, Frequent Pain Assessment, Oral Care, Palliative Wound Care, and Turn Reposition  Additional Recommendations (Limitations, Scope, Preferences): Continue current care  This conversation/these recommendations were discussed with patient primary care team, Dr. Candiss Norse  Billing based on MDM: High  Problems Addressed: One acute or chronic illness or injury that poses a threat to life or bodily function  Amount and/or Complexity of Data: Category 3:Discussion of management or test interpretation with external physician/other qualified health care professional/appropriate source (not separately reported)  Risks: Decision regarding hospitalization or  escalation of hospital care and Decision not to resuscitate or to de-escalate care because of poor prognosis ______________________________________________________ [place] Team Team Cell Phone: [phone] Please utilize secure chat with additional questions, if there is no response within 30 minutes please call the above phone number  Palliative Medicine Team providers are available by phone from 7am to 7pm daily and can be reached through the team cell phone.  Should this patient require assistance outside of these hours, please call the patient's attending physician.

## 2022-02-10 NOTE — Progress Notes (Signed)
Attempted to ambulate pt in room and pt stated he was "snug in the bed and did not want to get up". This RN will attempt again around lunch time.

## 2022-02-10 NOTE — Progress Notes (Signed)
Brief ID note  Patient's blood cultures on 11/2 positive again for Staph aureus.  Continues on daptomycin and ceftaroline which was initiated 10/31.  Will repeat blood cultures again today.  Continue current antibiotics.    Raynelle Highland for [facility] 02/10/2022, 8:11 AM

## 2022-02-10 NOTE — Progress Notes (Signed)
  Echocardiogram 2D Echocardiogram has been performed.  Allen Wheeler 02/10/2022, 12:05 PM

## 2022-02-11 ENCOUNTER — Inpatient Hospital Stay (HOSPITAL_COMMUNITY): Payer: Medicare Other

## 2022-02-11 DIAGNOSIS — R7881 Bacteremia: Secondary | ICD-10-CM | POA: Diagnosis not present

## 2022-02-11 DIAGNOSIS — B9562 Methicillin resistant Staphylococcus aureus infection as the cause of diseases classified elsewhere: Secondary | ICD-10-CM | POA: Diagnosis not present

## 2022-02-11 LAB — COMPREHENSIVE METABOLIC PANEL
ALT: 19 U/L (ref 0–44)
AST: 20 U/L (ref 15–41)
Albumin: 1.8 g/dL — ABNORMAL LOW (ref 3.5–5.0)
Alkaline Phosphatase: 48 U/L (ref 38–126)
Anion gap: 11 (ref 5–15)
BUN: 23 mg/dL (ref 8–23)
CO2: 18 mmol/L — ABNORMAL LOW (ref 22–32)
Calcium: 8.1 mg/dL — ABNORMAL LOW (ref 8.9–10.3)
Chloride: 103 mmol/L (ref 98–111)
Creatinine, Ser: 1.46 mg/dL — ABNORMAL HIGH (ref 0.61–1.24)
GFR, Estimated: 43 mL/min — ABNORMAL LOW (ref >60)
Glucose, Bld: 178 mg/dL — ABNORMAL HIGH (ref 70–99)
Potassium: 3.9 mmol/L (ref 3.5–5.1)
Sodium: 132 mmol/L — ABNORMAL LOW (ref 135–145)
Total Bilirubin: 0.5 mg/dL (ref 0.3–1.2)
Total Protein: 5.1 g/dL — ABNORMAL LOW (ref 6.5–8.1)

## 2022-02-11 LAB — CBC WITH DIFFERENTIAL/PLATELET
Abs Immature Granulocytes: 0.12 10*3/uL — ABNORMAL HIGH (ref 0.00–0.07)
Basophils Absolute: 0 10*3/uL (ref 0.0–0.1)
Basophils Relative: 0 %
Eosinophils Absolute: 0 10*3/uL (ref 0.0–0.5)
Eosinophils Relative: 0 %
HCT: 32.3 % — ABNORMAL LOW (ref 39.0–52.0)
Hemoglobin: 11 g/dL — ABNORMAL LOW (ref 13.0–17.0)
Immature Granulocytes: 1 %
Lymphocytes Relative: 14 %
Lymphs Abs: 1.2 10*3/uL (ref 0.7–4.0)
MCH: 31.3 pg (ref 26.0–34.0)
MCHC: 34.1 g/dL (ref 30.0–36.0)
MCV: 91.8 fL (ref 80.0–100.0)
Monocytes Absolute: 0.4 10*3/uL (ref 0.1–1.0)
Monocytes Relative: 5 %
Neutro Abs: 7 10*3/uL (ref 1.7–7.7)
Neutrophils Relative %: 80 %
Platelets: 145 10*3/uL — ABNORMAL LOW (ref 150–400)
RBC: 3.52 MIL/uL — ABNORMAL LOW (ref 4.22–5.81)
RDW: 14.2 % (ref 11.5–15.5)
WBC: 8.8 10*3/uL (ref 4.0–10.5)
nRBC: 0 % (ref 0.0–0.2)

## 2022-02-11 LAB — CULTURE, BLOOD (ROUTINE X 2)

## 2022-02-11 LAB — PROCALCITONIN: Procalcitonin: <0.1 ng/mL

## 2022-02-11 LAB — C-REACTIVE PROTEIN: CRP: 13.4 mg/dL — ABNORMAL HIGH (ref <1.0)

## 2022-02-11 LAB — BRAIN NATRIURETIC PEPTIDE: B Natriuretic Peptide: 204.1 pg/mL — ABNORMAL HIGH (ref 0.0–100.0)

## 2022-02-11 LAB — MAGNESIUM: Magnesium: 1.7 mg/dL (ref 1.7–2.4)

## 2022-02-11 MED ORDER — MAGNESIUM SULFATE 2 GM/50ML IV SOLN
2.0000 g | Freq: Once | INTRAVENOUS | Status: AC
  Administered 2022-02-11: 2 g via INTRAVENOUS
  Filled 2022-02-11: qty 50

## 2022-02-11 MED ORDER — DICLOFENAC SODIUM 1 % EX GEL
2.0000 g | Freq: Three times a day (TID) | CUTANEOUS | Status: DC
  Administered 2022-02-11 – 2022-02-16 (×15): 2 g via TOPICAL
  Filled 2022-02-11: qty 100

## 2022-02-11 NOTE — Progress Notes (Signed)
PHARMACY - PHYSICIAN COMMUNICATION CRITICAL VALUE ALERT - BLOOD CULTURE IDENTIFICATION (BCID)  Allen Wheeler is an 88 y.o. male who presented to [facility] on 01/31/2022  Assessment:  75 yom with persistent MRSA bacteremia, continuing on daptomycin and ceftarline. BCx again positive for GPC in clusters (1 of 4 bottles) on 11/4 (sensitivities previously reported).  Name of physician (or Provider) Contacted: Candiss Norse, P  Current antibiotics: ceftaroline, daptomycin  Changes to prescribed antibiotics recommended:  Patient is on recommended antibiotics - No changes needed  Results for orders placed or performed during the hospital encounter of 01/31/22  Blood Culture ID Panel (Reflexed) (Collected: 01/31/2022  6:22 PM)  Result Value Ref Range   Enterococcus faecalis NOT DETECTED NOT DETECTED   Enterococcus Faecium NOT DETECTED NOT DETECTED   Listeria monocytogenes NOT DETECTED NOT DETECTED   Staphylococcus species DETECTED (A) NOT DETECTED   Staphylococcus aureus (BCID) DETECTED (A) NOT DETECTED   Staphylococcus epidermidis NOT DETECTED NOT DETECTED   Staphylococcus lugdunensis NOT DETECTED NOT DETECTED   Streptococcus species NOT DETECTED NOT DETECTED   Streptococcus agalactiae NOT DETECTED NOT DETECTED   Streptococcus pneumoniae NOT DETECTED NOT DETECTED   Streptococcus pyogenes NOT DETECTED NOT DETECTED   A.calcoaceticus-baumannii NOT DETECTED NOT DETECTED   Bacteroides fragilis NOT DETECTED NOT DETECTED   Enterobacterales NOT DETECTED NOT DETECTED   Enterobacter cloacae complex NOT DETECTED NOT DETECTED   Escherichia coli NOT DETECTED NOT DETECTED   Klebsiella aerogenes NOT DETECTED NOT DETECTED   Klebsiella oxytoca NOT DETECTED NOT DETECTED   Klebsiella pneumoniae NOT DETECTED NOT DETECTED   Proteus species NOT DETECTED NOT DETECTED   Salmonella species NOT DETECTED NOT DETECTED   Serratia marcescens NOT DETECTED NOT DETECTED   Haemophilus influenzae NOT DETECTED NOT  DETECTED   Neisseria meningitidis NOT DETECTED NOT DETECTED   Pseudomonas aeruginosa NOT DETECTED NOT DETECTED   Stenotrophomonas maltophilia NOT DETECTED NOT DETECTED   Candida albicans NOT DETECTED NOT DETECTED   Candida auris NOT DETECTED NOT DETECTED   Candida glabrata NOT DETECTED NOT DETECTED   Candida krusei NOT DETECTED NOT DETECTED   Candida parapsilosis NOT DETECTED NOT DETECTED   Candida tropicalis NOT DETECTED NOT DETECTED   Cryptococcus neoformans/gattii NOT DETECTED NOT DETECTED   Meth resistant mecA/C and MREJ DETECTED (A) NOT DETECTED    Arturo Morton, PharmD, BCPS Please check AMION for all [facility] contact numbers Clinical Pharmacist 02/11/2022 1:46 PM

## 2022-02-11 NOTE — Plan of Care (Signed)
  Problem: Education: Goal: Knowledge of risk factors and measures for prevention of condition will improve Outcome: Progressing   Problem: Coping: Goal: Psychosocial and spiritual needs will be supported Outcome: Progressing   Problem: Respiratory: Goal: Will maintain a patent airway Outcome: Progressing   Problem: Education: Goal: Ability to describe self-care measures that may prevent or decrease complications (Diabetes Survival Skills Education) will improve Outcome: Progressing   Problem: Coping: Goal: Ability to adjust to condition or change in health will improve Outcome: Progressing   Problem: Health Behavior/Discharge Planning: Goal: Ability to identify and utilize available resources and services will improve Outcome: Progressing

## 2022-02-11 NOTE — Progress Notes (Signed)
PROGRESS NOTE                                                                                                                                                                                                             Patient Demographics:    Allen Wheeler, is a 88 y.o. male, DOB - Jun 09, 1919, TXM:[id number]  Outpatient Primary MD for the patient is [name], MD    LOS - 11  Admit date - 01/31/2022    Chief Complaint  Patient presents with   Code Sepsis       Brief Narrative (HPI from H&P)   88 y.o. M with PMH of GERD, depression/anxiety and discharged home yesterday after being hospitalized with Covid PNA.   He recently visited [place] to celebrate his 89th birthday and became ill after traveling home.  He was admitted 10/22-10/23 and treated with Molnupavir.  After discharge home, family states he initially seemed to be doing well, however slept most of the day today and was increasingly lethargic and high fever, in the ER he was found to have bacterial right lower lobe pneumonia, further work-up showed MRSA bacteremia, in the ER he also had a run of V. tach.  He was admitted by PCCM, in ICU he had Foley catheter placement by urology, he was stabilized and transferred to my care on 02/04/2022 on day 4 of hospital stay.   Subjective:   Patient in bed, appears comfortable, denies any headache, no fever, no chest pain or pressure, no shortness of breath , no abdominal pain. No new focal weakness , dull low blood pain.   Assessment  & Plan :   Severe sepsis, MRSA bacteremia after recent episode of COVID-pneumonia likely due to bacterial infection likely due to aspiration pneumonia caused at home. he rightly got weak and deconditioned due to recent COVID-19 infection and aspirated, developed bacterial pneumonia and now persistent MRSA bacteremia.  Has been seen by ID and PCCM, initially required BiPAP, now stable on 2 L nasal  cannula oxygen, request speech to evaluate.  Antibiotics per ID currently on combination of daptomycin and Teflaro, initial Echocardiogram does not show any evidence of endocarditis sepsis pathophysiology, due to persistent bacteremia echocardiogram was repeated on 02/10/2022 showing inconclusive aortic and mitral valve changes in the presence of advanced degeneration likely age-related, being treated empirically for endocarditis.  Also MRI L-spine was not suggestive of infection per neurosurgery.  Appears nontoxic but does have persistent bacteremia.  Continue on antibiotics and monitor.  Acute hypoxic respiratory failure due to pneumonia as above.  Much improved.  Initially required BiPAP now on nasal cannula, have speech evaluate, antibiotics for bacterial pneumonia, steroids for recent COVID infection.  Speech to evaluate to rule out ongoing aspiration.  Monitor closely.  Overall improving.  V.Tach in the ER.  Likely due to sepsis.  Resolved after amiodarone.  Avoiding beta-blocker or nodal agents due to underlying history of bradycardia and heart block.  Monitor electrolytes.  Junctional bradycardia with possible history of underlying second-degree AV block Mobitz type I.  Avoiding nodal agents.  Not a candidate for pacemaker due to bacteremia.  TSH is stable.  Continue to monitor on telemetry.  Blood pressure stable.  She had cardiology input discussed with Dr. Harrington Challenger on 02/06/2022.  No further work-up or intervention as suggested by cardiology.  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  Gentle oral Lasix x1 on 02/04/2022, placed on low-dose hydralazine and Imdur if blood pressure permits, avoiding ACE/ARB due to recent AKI, no beta-blocker due to bradycardia and possible underlying second-degree AV block Mobitz type I.  Foley catheter trauma in ICU causing hematuria, underlying history of BPH.  Foley catheter in ICU was pulled due to patient's delirium, urology was  consulted they have placed a Foley catheter on 02/03/2022.  Continue Flomax likely will be discharged home with Foley catheter with outpatient urology follow-up.  AKI.  Resolved.  Hypothyroidism.  On Synthroid continue at home dose, TSH stable.  Hypertension.  Placed on low-dose Norvasc and monitor.  Dull low back pain.  Ongoing for 1 to 2 days.  Patient actually unsure about the duration.  MRI obtained in the setting of bacteremia, possible small L1 fracture with nonspecific surrounding edema, case discussed with Dr. Kathyrn Sheriff on 02/08/2022, he reviewed the MRI and agrees there is no infection, he agreed for supportive care for now, will add TLSO brace when getting out of the bed.  Add NSAID cream for supportive care.       Condition - Extremely Guarded  Family Communication  :    Son on [name] [phone] 02/04/22, 02/06/2022, 02/07/2022 at 7:49 AM message left, called on 02/11/2022 8:14 AM and message left  Code Status :  No CPR or intubation, discussed with son on 02/04/2022, 02/06/22.  Consults  : PCCM, cardiology, urology, neurosurgeon Dr. Kathyrn Sheriff over the phone on 02/07/2022  PUD Prophylaxis :  PPI   Procedures  :     MRI - 1. Acute to subacute compression fracture involving the inferior aspect of L1 with mild 10% height loss without bony retropulsion. 2. Underlying multilevel degenerative disc disease and facet hypertrophy with resultant mild to moderate spinal stenosis at L2-3 through L4-5, with mild-to-moderate multilevel foraminal narrowing as above. 3. Mild edema within the right psoas muscle, favored to be secondary to the L1 compression fracture, with infection felt to be unlikely. No other evidence for acute infection elsewhere within the lumbar spine. No collections. 4. Aneurysmal dilatation of the infrarenal aorta up to 3.9 cm. Recommend follow-up every 2 years. This recommendation follows ACR consensus guidelines: White Paper of the ACR Incidental Findings Committee II on  Vascular Findings. J Am Coll Radiol 2013; 10:789-794. 5. Mild distension of the urinary bladder with a Foley catheter in place. Bedside check to ensure adequate placement and functioning suggested  TTE 02/01/22 -  1. Left ventricular ejection fraction, by estimation, is 40 to 45%. The left ventricle has mildly decreased function. The left ventricle demonstrates global hypokinesis. There is moderate asymmetric left ventricular hypertrophy of the septal segment. Left ventricular diastolic parameters are consistent with Grade I diastolic dysfunction (impaired relaxation).  2. Right ventricular systolic function is normal. The right ventricular size is normal.  3. Left atrial size was mildly dilated.  4. The mitral valve is normal in structure. Mild mitral valve regurgitation. No evidence of mitral stenosis.  5. The aortic valve is calcified. There is moderate calcification of the aortic valve. There is mild thickening of the aortic valve. Aortic valve regurgitation is not visualized. Aortic valve sclerosis/calcification is present, without any evidence of aortic stenosis. Aortic valve area, by VTI measures 2.00 cm. Aortic valve mean gradient measures 8.0 mmHg. Aortic valve Vmax measures 1.96 m/s.  6. The inferior vena cava is normal in size with greater than 50% respiratory variability, suggesting right atrial pressure of 3 mmHg.  Repeat TTE 02/11/22 -  1. Unable to determine if valvular vegetations present due to significant degenerative valvular changes with calcificaiton of both the aortic and mitral valves. If clinical suspicion for infective endocarditis, would consider empiric treatment.  2. Left ventricular ejection fraction, by estimation, is 50 to 55%. The left ventricle has low normal function.  3. The mitral valve is degenerative. Trivial mitral valve regurgitation. Severe mitral annular calcification.  4. The aortic valve is tricuspid. There is moderate calcification of the aortic valve. There is  moderate thickening of the aortic valve. Aortic valve regurgitation is trivial. There is aortic sclerosis/calcification without significant stenosis. Mean AoV gradient 94mHg with Vmax 272m      Disposition Plan  :  Status is: Inpatient  DVT Prophylaxis  :    heparin injection 5,000 Units Start: 01/31/22 2200 SCDs Start: 01/31/22 2105   Lab Results  Component Value Date   PLT 145 (L) 02/11/2022    Diet :  Diet Order             Diet regular Room service appropriate? Yes with Assist; Fluid consistency: Thin  Diet effective now                    Inpatient Medications  Scheduled Meds:  acetaminophen  650 mg Oral BID   amLODipine  5 mg Oral Daily   Chlorhexidine Gluconate Cloth  6 each Topical Daily   citalopram  10 mg Oral q AM   heparin  5,000 Units Subcutaneous Q8H   levothyroxine  50 mcg Oral QAC breakfast   nystatin   Topical TID   pantoprazole  40 mg Oral Daily   tamsulosin  0.8 mg Oral QPC supper   Continuous Infusions:  ceFTAROline (TEFLARO) IV 400 mg (02/11/22 0435)   DAPTOmycin (CUBICIN) 500 mg in sodium chloride 0.9 % IVPB 500 mg (02/10/22 2113)   PRN Meds:.acetaminophen, docusate sodium, guaiFENesin, ipratropium-albuterol, LORazepam, mouth rinse, polyethylene glycol, traMADol    Objective:   Vitals:   02/11/22 0000 02/11/22 0400 02/11/22 0441 02/11/22 0749  BP: 122/70 (!) 121/32  (!) 106/49  Pulse: 65 60  62  Resp: '19 16  18  '$ Temp: 98.3 F (36.8 C) 98.2 F (36.8 C)  (!) 97.5 F (36.4 C)  TempSrc: Oral Oral  Oral  SpO2: 95% 94%    Weight:   65.4 kg   Height:        Wt Readings from Last 3 Encounters:  02/11/22 65.4  kg  05/15/21 70.4 kg  05/12/21 72.1 kg     Intake/Output Summary (Last 24 hours) at 02/11/2022 0813 Last data filed at 02/11/2022 0447 Gross per 24 hour  Intake --  Output 1200 ml  Net -1200 ml     Physical Exam  Awake Alert, No new F.N deficits, Normal affect .AT,PERRAL Supple Neck, No JVD,   Symmetrical Chest  wall movement, Good air movement bilaterally, CTAB RRR,No Gallops, Rubs or new Murmurs,  +ve B.Sounds, Abd Soft, No tenderness,   No Cyanosis, Clubbing or edema    RN pressure injury documentation: Pressure Injury 02/01/22 Buttocks Right;Left Stage 1 -  Intact skin with non-blanchable redness of a localized area usually over a bony prominence. (Active)  02/01/22 0055  Location: Buttocks  Location Orientation: Right;Left  Staging: Stage 1 -  Intact skin with non-blanchable redness of a localized area usually over a bony prominence.  Wound Description (Comments):   Present on Admission:   Dressing Type Foam - Lift dressing to assess site every shift 02/10/22 1958      Data Review:    CBC Recent Labs  Lab 02/06/22 0348 02/07/22 0600 02/08/22 0643 02/10/22 0229 02/11/22 0320  WBC 11.1* 10.7* 9.5 9.8 8.8  HGB 11.9* 12.0* 12.1* 11.1* 11.0*  HCT 34.9* 35.6* 36.6* 33.0* 32.3*  PLT 203 197 213 180 145*  MCV 90.9 90.6 92.2 91.9 91.8  MCH 31.0 30.5 30.5 30.9 31.3  MCHC 34.1 33.7 33.1 33.6 34.1  RDW 14.0 14.0 14.2 14.3 14.2  LYMPHSABS 0.6* 0.7 1.1 1.4 1.2  MONOABS 0.4 0.4 0.4 0.6 0.4  EOSABS 0.0 0.0 0.0 0.0 0.0  BASOSABS 0.0 0.0 0.0 0.0 0.0    Electrolytes Recent Labs  Lab 02/05/22 0334 02/06/22 0348 02/07/22 0600 02/08/22 0302 02/10/22 0229 02/11/22 0320  NA 134* 134* 133* 133* 134* 132*  K 4.3 4.2 4.3 5.1 4.1 3.9  CL 103 102 103 102 103 103  CO2 23 22 19* 21* 21* 18*  GLUCOSE 148* 129* 125* 132* 89 178*  BUN 25* '23 23 22 21 23  '$ CREATININE 1.29* 1.12 1.14 1.22 1.27* 1.46*  CALCIUM 7.7* 8.1* 8.0* 8.0* 8.0* 8.1*  AST '18 15 15 18 15 20  '$ ALT '24 22 21 19 19 19  '$ ALKPHOS 47 48 49 46 40 48  BILITOT 0.9 1.0 1.0 0.5 0.6 0.5  ALBUMIN 1.9* 2.0* 2.1* 2.0* 1.9* 1.8*  MG 1.9 1.9 1.9  --  1.8 1.7  CRP 10.3* 11.0* 12.4*  --  13.0* 13.4*  PROCALCITON 0.42 0.24 0.18 0.10 <0.10  --   BNP 809.5* 731.9* 478.6*  --  209.2* 204.1*     ------------------------------------------------------------------------------------------------------------------ No results for input(s): "CHOL", "HDL", "LDLCALC", "TRIG", "CHOLHDL", "LDLDIRECT" in the last 72 hours.  Lab Results  Component Value Date   HGBA1C 5.9 (H) 01/31/2022    No results for input(s): "TSH", "T4TOTAL", "T3FREE", "THYROIDAB" in the last 72 hours.  Invalid input(s): "FREET3"    Radiology Reports DG Chest Port 1 View  Result Date: 02/11/2022 CLINICAL DATA:  Shortness of breath EXAM: PORTABLE CHEST 1 VIEW COMPARISON:  Yesterday FINDINGS: Cardiomegaly and aortic tortuosity accentuated by rotation. Indistinct opacity at the lung bases 1 non progressed. No visible effusion or pneumothorax. IMPRESSION: Stable pulmonary infiltrates. Electronically Signed   By: Jorje Guild M.D.   On: 02/11/2022 07:41   ECHOCARDIOGRAM LIMITED  Result Date: 02/10/2022    ECHOCARDIOGRAM LIMITED REPORT   Patient Name:   Allen Wheeler Date of Exam: 02/10/2022  Medical Rec #:  [id number]        Height:       66.0 in Accession #:    [id number]       Weight:       143.5 lb Date of Birth:  14-Jul-1919       BSA:          1.737 m Patient Age:    89 years        BP:           126/106 mmHg Patient Gender: M                HR:           55 bpm. Exam Location:  Inpatient Procedure: Limited Echo, Color Doppler and Cardiac Doppler Indications:    Bacteremia  History:        Patient has prior history of Echocardiogram examinations.  Sonographer:    Johny Chess RDCS Referring Phys: [id number] [facility]  Sonographer Comments: Image acquisition challenging due to respiratory motion. IMPRESSIONS  1. Unable to determine if valvular vegetations present due to significant degenerative valvular changes with calcificaiton of both the aortic and mitral valves. If clinical suspicion for infective endocarditis, would consider empiric treatment.  2. Left ventricular ejection fraction, by estimation, is 50 to  55%. The left ventricle has low normal function.  3. The mitral valve is degenerative. Trivial mitral valve regurgitation. Severe mitral annular calcification.  4. The aortic valve is tricuspid. There is moderate calcification of the aortic valve. There is moderate thickening of the aortic valve. Aortic valve regurgitation is trivial. There is aortic sclerosis/calcification without significant stenosis. Mean AoV gradient 45mHg with Vmax 243m. FINDINGS  Left Ventricle: Left ventricular ejection fraction, by estimation, is 50 to 55%. The left ventricle has low normal function. Pericardium: There is no evidence of pericardial effusion. Mitral Valve: The mitral valve is degenerative in appearance. Severe mitral annular calcification. Trivial mitral valve regurgitation. Tricuspid Valve: The tricuspid valve is grossly normal. Tricuspid valve regurgitation is trivial. Aortic Valve: The aortic valve is tricuspid. There is moderate calcification of the aortic valve. There is moderate thickening of the aortic valve. Aortic valve regurgitation is trivial. Mild aortic stenosis is present. Aortic valve mean gradient measures 8.5 mmHg. Aortic valve peak gradient measures 16.3 mmHg. Pulmonic Valve: The pulmonic valve was grossly normal. Pulmonic valve regurgitation is trivial. Additional Comments: Spectral Doppler performed. Color Doppler performed.  AORTIC VALVE AV Vmax:           202.00 cm/s AV Vmean:          132.000 cm/s AV VTI:            0.416 m AV Peak Grad:      16.3 mmHg AV Mean Grad:      8.5 mmHg LVOT Vmax:         90.45 cm/s LVOT Vmean:        54.000 cm/s LVOT VTI:          0.195 m LVOT/AV VTI ratio: 0.47  SHUNTS Systemic VTI: 0.20 m HeGwyndolyn KaufmanD Electronically signed by HeGwyndolyn KaufmanD Signature Date/Time: 02/10/2022/1:29:44 PM    Final    DG Chest Port 1 View  Result Date: 02/10/2022 CLINICAL DATA:  Shortness of breath EXAM: PORTABLE CHEST 1 VIEW COMPARISON:  02/04/2022 FINDINGS: Normal heart size.  Mediastinal widening primarily from aortic tortuosity, accentuated by scoliosis. Infiltrates at the lung bases are unchanged. No visible effusion or pneumothorax. IMPRESSION: Unchanged  bilateral pneumonia. Electronically Signed   By: Jorje Guild M.D.   On: 02/10/2022 08:32   DG Swallowing Func-Speech Pathology  Result Date: 02/08/2022 Table formatting from the original result was not included. Objective Swallowing Evaluation: Type of Study: MBS-Modified Barium Swallow Study  Patient Details Name: Allen Wheeler MRN: [id number] Date of Birth: 12-21-1919 Today's Date: 02/08/2022 Time: SLP Start Time (ACUTE ONLY): 1401 -SLP Stop Time (ACUTE ONLY): 1425 SLP Time Calculation (min) (ACUTE ONLY): 24 min Past Medical History: Past Medical History: Diagnosis Date  Allergy   Anxiety   Chickenpox   Depression   Dizziness, nonspecific   loss of balance last few months  GERD (gastroesophageal reflux disease)   Hearing loss   wears hearing aids  Nephrolithiasis   Normal cardiac stress test 09-20-12  Osteoarthritis  Past Surgical History: Past Surgical History: Procedure Laterality Date  CATARACT EXTRACTION  2017  CHOLECYSTECTOMY N/A 02/18/2016  Procedure: LAPAROSCOPIC CHOLECYSTECTOMY WITH POSSIBLE INTRAOPERATIVE CHOLANGIOGRAM;  Surgeon: Mickeal Skinner, MD;  Location: [facility];  Service: General;  Laterality: N/A;  ERCP N/A 02/16/2016  Procedure: ENDOSCOPIC RETROGRADE CHOLANGIOPANCREATOGRAPHY (ERCP);  Surgeon: Teena Irani, MD;  Location: [facility];  Service: Endoscopy;  Laterality: N/A;  HERNIA REPAIR    wears truss  INGUINAL HERNIA REPAIR Right 06/21/2016  Procedure: OPEN RIGHT INGUINAL HERNIA REPAIR;  Surgeon: [name] Kinsinger, MD;  Location: [facility];  Service: General;  Laterality: Right;  INSERTION OF MESH Right 06/21/2016  Procedure: INSERTION OF MESH;  Surgeon: [name] Kinsinger, MD;  Location: [facility];  Service: General;  Laterality: Right;  MASTOIDECTOMY   HPI: Pt is a 89 year old male with a recent  diagnosis of COVID-pneumonia, treated, discharged home then reutrned the following day due to increased lethargy. CXR 10/29: right-sided infiltrate is worsened in the interval. The  left-sided infiltrate is mildly worsened as well. Pt with acute hypoxic respiratory failure, sever sepsis with acute metabolic encephalopahthy due to PNA. PMH: GERD, depression/anxiety  No data recorded  Recommendations for follow up therapy are one component of a multi-disciplinary discharge planning process, led by the attending physician.  Recommendations may be updated based on patient status, additional functional criteria and insurance authorization. Assessment / Plan / Recommendation   02/08/2022   2:45 PM Clinical Impressions Clinical Impression Pt presents with pharyngeal dysphagia characterized by a pharyngeal delay. This resulted in penetration (PAS 3, 5) of thin and nectar thick liquids before and during the swallow and trace silent aspiration (PAS 8) of penetrated thin liquids after deglutition. Prompted coughing was effective in expelling aspirated material. Laryngeal invasion consistently occured with consecutive swallows only and was eliminated with pt's prompted use of individual swallows of liquids. A regular texture diet with thin liquids is recommended with observance of swallowing precautions and supervision to ensure pt's observance. SLP will continue to follow pt. SLP Visit Diagnosis Dysphagia, pharyngeal phase (R13.13) Impact on safety and function Mild aspiration risk     02/08/2022   2:45 PM Treatment Recommendations Treatment Recommendations Therapy as outlined in treatment plan below     02/08/2022   2:45 PM Prognosis Prognosis for Safe Diet Advancement Good   02/08/2022   2:45 PM Diet Recommendations SLP Diet Recommendations Regular solids;Thin liquid Medication Administration Whole meds with puree Compensations Slow rate;Small sips/bites     02/08/2022   2:45 PM Other Recommendations Oral Care Recommendations  Oral care BID Follow Up Recommendations No SLP follow up Assistance recommended at discharge PRN Functional Status Assessment Patient has had a recent decline in  their functional status and demonstrates the ability to make significant improvements in function in a reasonable and predictable amount of time.   02/08/2022   2:45 PM Frequency and Duration  Speech Therapy Frequency (ACUTE ONLY) min 1 x/week Treatment Duration 1 week     02/08/2022   1:35 PM Oral Phase Oral Phase [facility]    02/08/2022   2:45 PM Pharyngeal Phase Pharyngeal Material enters airway, remains ABOVE vocal cords and not ejected out Pharyngeal Material enters airway, remains ABOVE vocal cords and not ejected out Pharyngeal Material enters airway, remains ABOVE vocal cords and not ejected out;Material enters airway, CONTACTS cords and not ejected out;Material enters airway, passes BELOW cords without attempt by patient to eject out (silent aspiration) Pharyngeal Material enters airway, remains ABOVE vocal cords and not ejected out;Material enters airway, CONTACTS cords and not ejected out    02/08/2022   1:35 PM Cervical Esophageal Phase  Cervical Esophageal Phase [facility] [address] I. Hardin Negus, [place], [place] Office number [phone] Horton Marshall 02/08/2022, 2:46 PM                        Signature  Lala Lund M.D on 02/11/2022 at 8:13 AM   -  To page go to [email or web address]

## 2022-02-12 ENCOUNTER — Inpatient Hospital Stay (HOSPITAL_COMMUNITY): Payer: Medicare Other

## 2022-02-12 DIAGNOSIS — R7881 Bacteremia: Secondary | ICD-10-CM | POA: Diagnosis not present

## 2022-02-12 DIAGNOSIS — B9562 Methicillin resistant Staphylococcus aureus infection as the cause of diseases classified elsewhere: Secondary | ICD-10-CM | POA: Diagnosis not present

## 2022-02-12 LAB — URINALYSIS, ROUTINE W REFLEX MICROSCOPIC
Bilirubin Urine: NEGATIVE
Glucose, UA: NEGATIVE mg/dL
Ketones, ur: NEGATIVE mg/dL
Nitrite: NEGATIVE
Protein, ur: NEGATIVE mg/dL
Specific Gravity, Urine: 1.015 (ref 1.005–1.030)
pH: 5 (ref 5.0–8.0)

## 2022-02-12 LAB — OSMOLALITY, URINE: Osmolality, Ur: 573 mOsm/kg (ref 300–900)

## 2022-02-12 LAB — COMPREHENSIVE METABOLIC PANEL
ALT: 19 U/L (ref 0–44)
AST: 17 U/L (ref 15–41)
Albumin: 1.9 g/dL — ABNORMAL LOW (ref 3.5–5.0)
Alkaline Phosphatase: 50 U/L (ref 38–126)
Anion gap: 15 (ref 5–15)
BUN: 24 mg/dL — ABNORMAL HIGH (ref 8–23)
CO2: 18 mmol/L — ABNORMAL LOW (ref 22–32)
Calcium: 8.4 mg/dL — ABNORMAL LOW (ref 8.9–10.3)
Chloride: 101 mmol/L (ref 98–111)
Creatinine, Ser: 1.4 mg/dL — ABNORMAL HIGH (ref 0.61–1.24)
GFR, Estimated: 45 mL/min — ABNORMAL LOW (ref >60)
Glucose, Bld: 97 mg/dL (ref 70–99)
Potassium: 4 mmol/L (ref 3.5–5.1)
Sodium: 134 mmol/L — ABNORMAL LOW (ref 135–145)
Total Bilirubin: 0.7 mg/dL (ref 0.3–1.2)
Total Protein: 5.5 g/dL — ABNORMAL LOW (ref 6.5–8.1)

## 2022-02-12 LAB — BRAIN NATRIURETIC PEPTIDE: B Natriuretic Peptide: 260.5 pg/mL — ABNORMAL HIGH (ref 0.0–100.0)

## 2022-02-12 LAB — CBC WITH DIFFERENTIAL/PLATELET
Abs Immature Granulocytes: 0.13 10*3/uL — ABNORMAL HIGH (ref 0.00–0.07)
Basophils Absolute: 0 10*3/uL (ref 0.0–0.1)
Basophils Relative: 0 %
Eosinophils Absolute: 0 10*3/uL (ref 0.0–0.5)
Eosinophils Relative: 0 %
HCT: 32.6 % — ABNORMAL LOW (ref 39.0–52.0)
Hemoglobin: 11.2 g/dL — ABNORMAL LOW (ref 13.0–17.0)
Immature Granulocytes: 1 %
Lymphocytes Relative: 13 %
Lymphs Abs: 1.3 10*3/uL (ref 0.7–4.0)
MCH: 31.3 pg (ref 26.0–34.0)
MCHC: 34.4 g/dL (ref 30.0–36.0)
MCV: 91.1 fL (ref 80.0–100.0)
Monocytes Absolute: 0.6 10*3/uL (ref 0.1–1.0)
Monocytes Relative: 6 %
Neutro Abs: 8.2 10*3/uL — ABNORMAL HIGH (ref 1.7–7.7)
Neutrophils Relative %: 80 %
Platelets: 162 10*3/uL (ref 150–400)
RBC: 3.58 MIL/uL — ABNORMAL LOW (ref 4.22–5.81)
RDW: 14 % (ref 11.5–15.5)
WBC: 10.3 10*3/uL (ref 4.0–10.5)
nRBC: 0 % (ref 0.0–0.2)

## 2022-02-12 LAB — SODIUM, URINE, RANDOM: Sodium, Ur: 125 mmol/L

## 2022-02-12 LAB — MAGNESIUM: Magnesium: 2 mg/dL (ref 1.7–2.4)

## 2022-02-12 LAB — PROCALCITONIN: Procalcitonin: <0.1 ng/mL

## 2022-02-12 LAB — C-REACTIVE PROTEIN: CRP: 15.2 mg/dL — ABNORMAL HIGH (ref <1.0)

## 2022-02-12 LAB — GLUCOSE, CAPILLARY
Glucose-Capillary: 103 mg/dL — ABNORMAL HIGH (ref 70–99)
Glucose-Capillary: 136 mg/dL — ABNORMAL HIGH (ref 70–99)
Glucose-Capillary: 119 mg/dL — ABNORMAL HIGH (ref 70–99)

## 2022-02-12 LAB — OSMOLALITY: Osmolality: 284 mOsm/kg (ref 275–295)

## 2022-02-12 LAB — CREATININE, URINE, RANDOM: Creatinine, Urine: 84 mg/dL

## 2022-02-12 MED ORDER — SODIUM CHLORIDE 0.9 % IV SOLN
300.0000 mg | Freq: Three times a day (TID) | INTRAVENOUS | Status: DC
  Administered 2022-02-12 – 2022-02-16 (×12): 300 mg via INTRAVENOUS
  Filled 2022-02-12 (×10): qty 15
  Filled 2022-02-12: qty 10
  Filled 2022-02-12 (×4): qty 15
  Filled 2022-02-12: qty 10
  Filled 2022-02-12 (×5): qty 15

## 2022-02-12 MED ORDER — SODIUM CHLORIDE 0.9 % IV SOLN
10.0000 mg/kg | INTRAVENOUS | Status: DC
  Administered 2022-02-13 – 2022-02-15 (×2): 650 mg via INTRAVENOUS
  Filled 2022-02-12 (×2): qty 13

## 2022-02-12 NOTE — Progress Notes (Addendum)
PROGRESS NOTE                                                                                                                                                                                                             Patient Demographics:    Allen Wheeler, is a 88 y.o. male, DOB - [birth year], XYI:[id number]  Outpatient Primary MD for the patient is [name], MD    LOS - 12  Admit date - 01/31/2022    Chief Complaint  Patient presents with   Code Sepsis       Brief Narrative (HPI from H&P)   88 y.o. M with PMH of GERD, depression/anxiety and discharged home yesterday after being hospitalized with Covid PNA.   He recently visited [place] to celebrate his 89th birthday and became ill after traveling home.  He was admitted 10/22-10/23 and treated with Molnupavir.  After discharge home, family states he initially seemed to be doing well, however slept most of the day today and was increasingly lethargic and high fever, in the ER he was found to have bacterial right lower lobe pneumonia, further work-up showed MRSA bacteremia, in the ER he also had a run of V. tach.  He was admitted by PCCM, in ICU he had Foley catheter placement by urology, he was stabilized and transferred to my care on 02/04/2022 on day 4 of hospital stay.   Subjective:   Patient in bed, appears comfortable, denies any headache, no fever, no chest pain or pressure, no shortness of breath , no abdominal pain. No focal weakness.  Lower back pain.   Assessment  & Plan :   Severe sepsis, MRSA bacteremia after recent episode of COVID-pneumonia likely due to bacterial infection likely due to aspiration pneumonia caused at home. he rightly got weak and deconditioned due to recent COVID-19 infection and aspirated, developed bacterial pneumonia and now persistent MRSA bacteremia.  Has been seen by ID and PCCM, initially required BiPAP, now stable on 2 L nasal cannula  oxygen, request speech to evaluate.  Antibiotics per ID currently on combination of daptomycin and Teflaro, initial Echocardiogram does not show any evidence of endocarditis sepsis pathophysiology, due to persistent bacteremia echocardiogram was repeated on 02/10/2022 showing inconclusive aortic and mitral valve changes in the presence of advanced degeneration likely age-related, being treated empirically for endocarditis.  Also  MRI L-spine was not suggestive of infection per neurosurgery.  Appears nontoxic but does have persistent bacteremia.  Continue on antibiotics and monitor.  Son agreeable for placing a PICC line with long-term antibiotics or Oral ABX for several weeks, if declines comfort measures.  Acute hypoxic respiratory failure due to pneumonia as above.  Much improved.  Initially required BiPAP now on nasal cannula, have speech evaluate, antibiotics for bacterial pneumonia, steroids for recent COVID infection.  Speech to evaluate to rule out ongoing aspiration.  Monitor closely.  Overall improving.  V.Tach in the ER.  Likely due to sepsis.  Resolved after amiodarone.  Avoiding beta-blocker or nodal agents due to underlying history of bradycardia and heart block.  Monitor electrolytes.  Junctional bradycardia with possible history of underlying second-degree AV block Mobitz type I.  Avoiding nodal agents.  Not a candidate for pacemaker due to bacteremia.  TSH is stable.  Continue to monitor on telemetry.  Blood pressure stable.  She had cardiology input discussed with Dr. Harrington Challenger on 02/06/2022.  No further work-up or intervention as suggested by cardiology.  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  Gentle oral Lasix x1 on 02/04/2022, placed on low-dose hydralazine and Imdur if blood pressure permits, avoiding ACE/ARB due to recent AKI, no beta-blocker due to bradycardia and possible underlying second-degree AV block Mobitz type I.  Foley catheter trauma in ICU causing  hematuria, underlying history of BPH.  Foley catheter in ICU was pulled due to patient's delirium, urology was consulted they have placed a Foley catheter on 02/03/2022.  Continue Flomax likely will be discharged home with Foley catheter with outpatient urology follow-up.  CKD 3B.  Baseline creatinine around 1.4.  Monitor.  Hypothyroidism.  On Synthroid continue at home dose, TSH stable.  Hypertension.  Placed on low-dose Norvasc and monitor.  Dull low back pain.  Ongoing for 1 to 2 days.  Patient actually unsure about the duration.  MRI obtained in the setting of bacteremia, possible small L1 fracture with nonspecific surrounding edema, case discussed with Dr. Kathyrn Sheriff on 02/08/2022, he reviewed the MRI and agrees there is no infection, he agreed for supportive care for now, will add TLSO brace when getting out of the bed.  Add NSAID cream for supportive care.       Condition - Extremely Guarded  Family Communication  :    Son on Allen Wheeler [phone] 02/04/22, 02/06/2022, 02/07/2022 at 7:49 AM message left, called on 02/11/2022 8:14 AM and message left  Code Status :  No CPR or intubation, discussed with son on 02/04/2022, 02/06/22.  Consults  : PCCM, cardiology, urology, neurosurgeon Dr. Kathyrn Sheriff over the phone on 02/07/2022  PUD Prophylaxis :  PPI   Procedures  :     MRI - 1. Acute to subacute compression fracture involving the inferior aspect of L1 with mild 10% height loss without bony retropulsion. 2. Underlying multilevel degenerative disc disease and facet hypertrophy with resultant mild to moderate spinal stenosis at L2-3 through L4-5, with mild-to-moderate multilevel foraminal narrowing as above. 3. Mild edema within the right psoas muscle, favored to be secondary to the L1 compression fracture, with infection felt to be unlikely. No other evidence for acute infection elsewhere within the lumbar spine. No collections. 4. Aneurysmal dilatation of the infrarenal aorta up to 3.9 cm.  Recommend follow-up every 2 years. This recommendation follows ACR consensus guidelines: White Paper of the ACR Incidental Findings Committee II on Vascular Findings. J Am Coll Radiol 2013; 10:789-794.  5. Mild distension of the urinary bladder with a Foley catheter in place. Bedside check to ensure adequate placement and functioning suggested  TTE 02/01/22 - 1. Left ventricular ejection fraction, by estimation, is 40 to 45%. The left ventricle has mildly decreased function. The left ventricle demonstrates global hypokinesis. There is moderate asymmetric left ventricular hypertrophy of the septal segment. Left ventricular diastolic parameters are consistent with Grade I diastolic dysfunction (impaired relaxation).  2. Right ventricular systolic function is normal. The right ventricular size is normal.  3. Left atrial size was mildly dilated.  4. The mitral valve is normal in structure. Mild mitral valve regurgitation. No evidence of mitral stenosis.  5. The aortic valve is calcified. There is moderate calcification of the aortic valve. There is mild thickening of the aortic valve. Aortic valve regurgitation is not visualized. Aortic valve sclerosis/calcification is present, without any evidence of aortic stenosis. Aortic valve area, by VTI measures 2.00 cm. Aortic valve mean gradient measures 8.0 mmHg. Aortic valve Vmax measures 1.96 m/s.  6. The inferior vena cava is normal in size with greater than 50% respiratory variability, suggesting right atrial pressure of 3 mmHg.  Repeat TTE 02/11/22 -  1. Unable to determine if valvular vegetations present due to significant degenerative valvular changes with calcificaiton of both the aortic and mitral valves. If clinical suspicion for infective endocarditis, would consider empiric treatment.  2. Left ventricular ejection fraction, by estimation, is 50 to 55%. The left ventricle has low normal function.  3. The mitral valve is degenerative. Trivial mitral valve  regurgitation. Severe mitral annular calcification.  4. The aortic valve is tricuspid. There is moderate calcification of the aortic valve. There is moderate thickening of the aortic valve. Aortic valve regurgitation is trivial. There is aortic sclerosis/calcification without significant stenosis. Mean AoV gradient 45mHg with Vmax 250m      Disposition Plan  :  Status is: Inpatient  DVT Prophylaxis  :    heparin injection 5,000 Units Start: 01/31/22 2200 SCDs Start: 01/31/22 2105   Lab Results  Component Value Date   PLT 162 02/12/2022    Diet :  Diet Order             Diet regular Room service appropriate? Yes with Assist; Fluid consistency: Thin  Diet effective now                    Inpatient Medications  Scheduled Meds:  acetaminophen  650 mg Oral BID   amLODipine  5 mg Oral Daily   Chlorhexidine Gluconate Cloth  6 each Topical Daily   citalopram  10 mg Oral q AM   diclofenac Sodium  2 g Topical TID   heparin  5,000 Units Subcutaneous Q8H   levothyroxine  50 mcg Oral QAC breakfast   nystatin   Topical TID   pantoprazole  40 mg Oral Daily   tamsulosin  0.8 mg Oral QPC supper   Continuous Infusions:  ceFTAROline (TEFLARO) IV     [START ON 02/13/2022] DAPTOmycin (CUBICIN) 650 mg in sodium chloride 0.9 % IVPB     PRN Meds:.acetaminophen, docusate sodium, guaiFENesin, ipratropium-albuterol, LORazepam, mouth rinse, polyethylene glycol, traMADol    Objective:   Vitals:   02/11/22 2328 02/12/22 0408 02/12/22 0600 02/12/22 0800  BP: (!) 140/72 (!) 126/59  113/64  Pulse: 71 65 64 69  Resp: 18     Temp: 98.6 F (37 C) 98 F (36.7 C)  98.2 F (36.8 C)  TempSrc: Oral Oral  Oral  SpO2: 97% 96% 97% 95%  Weight:      Height:        Wt Readings from Last 3 Encounters:  02/11/22 65.4 kg  05/15/21 70.4 kg  05/12/21 72.1 kg     Intake/Output Summary (Last 24 hours) at 02/12/2022 1020 Last data filed at 02/12/2022 0552 Gross per 24 hour  Intake --  Output  1450 ml  Net -1450 ml     Physical Exam  Awake Alert, No new F.N deficits, Normal affect  [place].AT,PERRAL Supple Neck, No JVD,   Symmetrical Chest wall movement, Good air movement bilaterally, CTAB RRR,No Gallops, Rubs or new Murmurs,  +ve B.Sounds, Abd Soft, No tenderness,   No Cyanosis, Clubbing or edema     RN pressure injury documentation: Pressure Injury 02/01/22 Buttocks Right;Left Stage 1 -  Intact skin with non-blanchable redness of a localized area usually over a bony prominence. (Active)  02/01/22 0055  Location: Buttocks  Location Orientation: Right;Left  Staging: Stage 1 -  Intact skin with non-blanchable redness of a localized area usually over a bony prominence.  Wound Description (Comments):   Present on Admission:   Dressing Type Foam - Lift dressing to assess site every shift 02/11/22 2000      Data Review:    CBC Recent Labs  Lab 02/07/22 0600 02/08/22 0643 02/10/22 0229 02/11/22 0320 02/12/22 0422  WBC 10.7* 9.5 9.8 8.8 10.3  HGB 12.0* 12.1* 11.1* 11.0* 11.2*  HCT 35.6* 36.6* 33.0* 32.3* 32.6*  PLT 197 213 180 145* 162  MCV 90.6 92.2 91.9 91.8 91.1  MCH 30.5 30.5 30.9 31.3 31.3  MCHC 33.7 33.1 33.6 34.1 34.4  RDW 14.0 14.2 14.3 14.2 14.0  LYMPHSABS 0.7 1.1 1.4 1.2 1.3  MONOABS 0.4 0.4 0.6 0.4 0.6  EOSABS 0.0 0.0 0.0 0.0 0.0  BASOSABS 0.0 0.0 0.0 0.0 0.0    Electrolytes Recent Labs  Lab 02/06/22 0348 02/07/22 0600 02/08/22 0302 02/10/22 0229 02/11/22 0320 02/12/22 0422  NA 134* 133* 133* 134* 132* 134*  K 4.2 4.3 5.1 4.1 3.9 4.0  CL 102 103 102 103 103 101  CO2 22 19* 21* 21* 18* 18*  GLUCOSE 129* 125* 132* 89 178* 97  BUN '23 23 22 21 23 '$ 24*  CREATININE 1.12 1.14 1.22 1.27* 1.46* 1.40*  CALCIUM 8.1* 8.0* 8.0* 8.0* 8.1* 8.4*  AST '15 15 18 15 20 17  '$ ALT '22 21 19 19 19 19  '$ ALKPHOS 48 49 46 40 48 50  BILITOT 1.0 1.0 0.5 0.6 0.5 0.7  ALBUMIN 2.0* 2.1* 2.0* 1.9* 1.8* 1.9*  MG 1.9 1.9  --  1.8 1.7 2.0  CRP 11.0* 12.4*  --  13.0* 13.4*  15.2*  PROCALCITON 0.24 0.18 0.10 <0.10 <0.10 <0.10  BNP 731.9* 478.6*  --  209.2* 204.1* 260.5*    ------------------------------------------------------------------------------------------------------------------ No results for input(s): "CHOL", "HDL", "LDLCALC", "TRIG", "CHOLHDL", "LDLDIRECT" in the last 72 hours.  Lab Results  Component Value Date   HGBA1C 5.9 (H) 01/31/2022    No results for input(s): "TSH", "T4TOTAL", "T3FREE", "THYROIDAB" in the last 72 hours.  Invalid input(s): "FREET3"    Radiology Reports DG Chest Port 1 View  Result Date: 02/11/2022 CLINICAL DATA:  Shortness of breath EXAM: PORTABLE CHEST 1 VIEW COMPARISON:  Yesterday FINDINGS: Cardiomegaly and aortic tortuosity accentuated by rotation. Indistinct opacity at the lung bases 1 non progressed. No visible effusion or pneumothorax. IMPRESSION: Stable pulmonary infiltrates. Electronically Signed   By: Gilford Silvius.D.  On: 02/11/2022 07:41   ECHOCARDIOGRAM LIMITED  Result Date: 02/10/2022    ECHOCARDIOGRAM LIMITED REPORT   Patient Name:   ADYEN BIFULCO Date of Exam: 02/10/2022 Medical Rec #:  [id number]        Height:       66.0 in Accession #:    [id number]       Weight:       143.5 lb Date of Birth:  1919/11/07       BSA:          1.737 m Patient Age:    89 years        BP:           126/106 mmHg Patient Gender: M                HR:           55 bpm. Exam Location:  Inpatient Procedure: Limited Echo, Color Doppler and Cardiac Doppler Indications:    Bacteremia  History:        Patient has prior history of Echocardiogram examinations.  Sonographer:    Allen Wheeler RDCS Referring Phys: [id number] [facility]  Sonographer Comments: Image acquisition challenging due to respiratory motion. IMPRESSIONS  1. Unable to determine if valvular vegetations present due to significant degenerative valvular changes with calcificaiton of both the aortic and mitral valves. If clinical suspicion for infective endocarditis,  would consider empiric treatment.  2. Left ventricular ejection fraction, by estimation, is 50 to 55%. The left ventricle has low normal function.  3. The mitral valve is degenerative. Trivial mitral valve regurgitation. Severe mitral annular calcification.  4. The aortic valve is tricuspid. There is moderate calcification of the aortic valve. There is moderate thickening of the aortic valve. Aortic valve regurgitation is trivial. There is aortic sclerosis/calcification without significant stenosis. Mean AoV gradient 72mHg with Vmax 227m. FINDINGS  Left Ventricle: Left ventricular ejection fraction, by estimation, is 50 to 55%. The left ventricle has low normal function. Pericardium: There is no evidence of pericardial effusion. Mitral Valve: The mitral valve is degenerative in appearance. Severe mitral annular calcification. Trivial mitral valve regurgitation. Tricuspid Valve: The tricuspid valve is grossly normal. Tricuspid valve regurgitation is trivial. Aortic Valve: The aortic valve is tricuspid. There is moderate calcification of the aortic valve. There is moderate thickening of the aortic valve. Aortic valve regurgitation is trivial. Mild aortic stenosis is present. Aortic valve mean gradient measures 8.5 mmHg. Aortic valve peak gradient measures 16.3 mmHg. Pulmonic Valve: The pulmonic valve was grossly normal. Pulmonic valve regurgitation is trivial. Additional Comments: Spectral Doppler performed. Color Doppler performed.  AORTIC VALVE AV Vmax:           202.00 cm/s AV Vmean:          132.000 cm/s AV VTI:            0.416 m AV Peak Grad:      16.3 mmHg AV Mean Grad:      8.5 mmHg LVOT Vmax:         90.45 cm/s LVOT Vmean:        54.000 cm/s LVOT VTI:          0.195 m LVOT/AV VTI ratio: 0.47  SHUNTS Systemic VTI: 0.20 m HeGwyndolyn KaufmanD Electronically signed by HeGwyndolyn KaufmanD Signature Date/Time: 02/10/2022/1:29:44 PM    Final    DG Chest Port 1 View  Result Date: 02/10/2022 CLINICAL DATA:   Shortness of breath EXAM: PORTABLE CHEST 1 VIEW  COMPARISON:  02/04/2022 FINDINGS: Normal heart size. Mediastinal widening primarily from aortic tortuosity, accentuated by scoliosis. Infiltrates at the lung bases are unchanged. No visible effusion or pneumothorax. IMPRESSION: Unchanged bilateral pneumonia. Electronically Signed   By: Jorje Guild M.D.   On: 02/10/2022 08:32   DG Swallowing Func-Speech Pathology  Result Date: 02/08/2022 Table formatting from the original result was not included. Objective Swallowing Evaluation: Type of Study: MBS-Modified Barium Swallow Study  Patient Details Name: ROCHELLE NEPHEW MRN: [id number] Date of Birth: 1919/06/10 Today's Date: 02/08/2022 Time: SLP Start Time (ACUTE ONLY): 1401 -SLP Stop Time (ACUTE ONLY): 1425 SLP Time Calculation (min) (ACUTE ONLY): 24 min Past Medical History: Past Medical History: Diagnosis Date  Allergy   Anxiety   Chickenpox   Depression   Dizziness, nonspecific   loss of balance last few months  GERD (gastroesophageal reflux disease)   Hearing loss   wears hearing aids  Nephrolithiasis   Normal cardiac stress test 09-20-12  Osteoarthritis  Past Surgical History: Past Surgical History: Procedure Laterality Date  CATARACT EXTRACTION  2017  CHOLECYSTECTOMY N/A 02/18/2016  Procedure: LAPAROSCOPIC CHOLECYSTECTOMY WITH POSSIBLE INTRAOPERATIVE CHOLANGIOGRAM;  Surgeon: Mickeal Skinner, MD;  Location: [facility];  Service: General;  Laterality: N/A;  ERCP N/A 02/16/2016  Procedure: ENDOSCOPIC RETROGRADE CHOLANGIOPANCREATOGRAPHY (ERCP);  Surgeon: Teena Irani, MD;  Location: [facility];  Service: Endoscopy;  Laterality: N/A;  HERNIA REPAIR    wears truss  INGUINAL HERNIA REPAIR Right 06/21/2016  Procedure: OPEN RIGHT INGUINAL HERNIA REPAIR;  Surgeon: [name] Kinsinger, MD;  Location: [facility];  Service: General;  Laterality: Right;  INSERTION OF MESH Right 06/21/2016  Procedure: INSERTION OF MESH;  Surgeon: [name] Kinsinger, MD;  Location: [facility];   Service: General;  Laterality: Right;  MASTOIDECTOMY   HPI: Pt is a 88 year old male with a recent diagnosis of COVID-pneumonia, treated, discharged home then reutrned the following day due to increased lethargy. CXR 10/29: right-sided infiltrate is worsened in the interval. The  left-sided infiltrate is mildly worsened as well. Pt with acute hypoxic respiratory failure, sever sepsis with acute metabolic encephalopahthy due to PNA. PMH: GERD, depression/anxiety  No data recorded  Recommendations for follow up therapy are one component of a multi-disciplinary discharge planning process, led by the attending physician.  Recommendations may be updated based on patient status, additional functional criteria and insurance authorization. Assessment / Plan / Recommendation   02/08/2022   2:45 PM Clinical Impressions Clinical Impression Pt presents with pharyngeal dysphagia characterized by a pharyngeal delay. This resulted in penetration (PAS 3, 5) of thin and nectar thick liquids before and during the swallow and trace silent aspiration (PAS 8) of penetrated thin liquids after deglutition. Prompted coughing was effective in expelling aspirated material. Laryngeal invasion consistently occured with consecutive swallows only and was eliminated with pt's prompted use of individual swallows of liquids. A regular texture diet with thin liquids is recommended with observance of swallowing precautions and supervision to ensure pt's observance. SLP will continue to follow pt. SLP Visit Diagnosis Dysphagia, pharyngeal phase (R13.13) Impact on safety and function Mild aspiration risk     02/08/2022   2:45 PM Treatment Recommendations Treatment Recommendations Therapy as outlined in treatment plan below     02/08/2022   2:45 PM Prognosis Prognosis for Safe Diet Advancement Good   02/08/2022   2:45 PM Diet Recommendations SLP Diet Recommendations Regular solids;Thin liquid Medication Administration Whole meds with puree Compensations  Slow rate;Small sips/bites     02/08/2022   2:45 PM  Other Recommendations Oral Care Recommendations Oral care BID Follow Up Recommendations No SLP follow up Assistance recommended at discharge PRN Functional Status Assessment Patient has had a recent decline in their functional status and demonstrates the ability to make significant improvements in function in a reasonable and predictable amount of time.   02/08/2022   2:45 PM Frequency and Duration  Speech Therapy Frequency (ACUTE ONLY) min 1 x/week Treatment Duration 1 week     02/08/2022   1:35 PM Oral Phase Oral Phase [facility]    02/08/2022   2:45 PM Pharyngeal Phase Pharyngeal Material enters airway, remains ABOVE vocal cords and not ejected out Pharyngeal Material enters airway, remains ABOVE vocal cords and not ejected out Pharyngeal Material enters airway, remains ABOVE vocal cords and not ejected out;Material enters airway, CONTACTS cords and not ejected out;Material enters airway, passes BELOW cords without attempt by patient to eject out (silent aspiration) Pharyngeal Material enters airway, remains ABOVE vocal cords and not ejected out;Material enters airway, CONTACTS cords and not ejected out    02/08/2022   1:35 PM Cervical Esophageal Phase  Cervical Esophageal Phase [facility] Shanika I. Hardin Negus, [place], [place] Office number [phone] Horton Marshall 02/08/2022, 2:46 PM                        Signature  Lala Lund M.D on 02/12/2022 at 10:20 AM   -  To page go to [email or web address]

## 2022-02-12 NOTE — Progress Notes (Signed)
PHARMACY - PHYSICIAN COMMUNICATION CRITICAL VALUE ALERT - BLOOD CULTURE IDENTIFICATION (BCID)  Allen Wheeler is an 88 y.o. male who presented to [facility] on 01/31/2022 with a chief complaint of hypoxia and history of COVID hospitalization.  Assessment:  89 you male with MRSA bacteremia on daptomycin and ceftaroline. Repeat blood cultures from 11/4 are positive for gram positive cocci in clusters.   Name of physician (or Provider) Contacted: Dr. Candiss Norse  Current antibiotics: ceftaroline, daptomycin  Changes to prescribed antibiotics recommended:  Patient is on recommended antibiotics - No changes needed  Results for orders placed or performed during the hospital encounter of 01/31/22  Blood Culture ID Panel (Reflexed) (Collected: 01/31/2022  6:22 PM)  Result Value Ref Range   Enterococcus faecalis NOT DETECTED NOT DETECTED   Enterococcus Faecium NOT DETECTED NOT DETECTED   Listeria monocytogenes NOT DETECTED NOT DETECTED   Staphylococcus species DETECTED (A) NOT DETECTED   Staphylococcus aureus (BCID) DETECTED (A) NOT DETECTED   Staphylococcus epidermidis NOT DETECTED NOT DETECTED   Staphylococcus lugdunensis NOT DETECTED NOT DETECTED   Streptococcus species NOT DETECTED NOT DETECTED   Streptococcus agalactiae NOT DETECTED NOT DETECTED   Streptococcus pneumoniae NOT DETECTED NOT DETECTED   Streptococcus pyogenes NOT DETECTED NOT DETECTED   A.calcoaceticus-baumannii NOT DETECTED NOT DETECTED   Bacteroides fragilis NOT DETECTED NOT DETECTED   Enterobacterales NOT DETECTED NOT DETECTED   Enterobacter cloacae complex NOT DETECTED NOT DETECTED   Escherichia coli NOT DETECTED NOT DETECTED   Klebsiella aerogenes NOT DETECTED NOT DETECTED   Klebsiella oxytoca NOT DETECTED NOT DETECTED   Klebsiella pneumoniae NOT DETECTED NOT DETECTED   Proteus species NOT DETECTED NOT DETECTED   Salmonella species NOT DETECTED NOT DETECTED   Serratia marcescens NOT DETECTED NOT DETECTED    Haemophilus influenzae NOT DETECTED NOT DETECTED   Neisseria meningitidis NOT DETECTED NOT DETECTED   Pseudomonas aeruginosa NOT DETECTED NOT DETECTED   Stenotrophomonas maltophilia NOT DETECTED NOT DETECTED   Candida albicans NOT DETECTED NOT DETECTED   Candida auris NOT DETECTED NOT DETECTED   Candida glabrata NOT DETECTED NOT DETECTED   Candida krusei NOT DETECTED NOT DETECTED   Candida parapsilosis NOT DETECTED NOT DETECTED   Candida tropicalis NOT DETECTED NOT DETECTED   Cryptococcus neoformans/gattii NOT DETECTED NOT DETECTED   Meth resistant mecA/C and MREJ DETECTED (A) NOT DETECTED    Jeneen Rinks 32/12/9240  6:83 AM

## 2022-02-12 NOTE — Progress Notes (Signed)
Mobility Specialist Progress Note:   02/12/22 0915  Mobility  Activity  (bed level ex.)  Range of Motion/Exercises Active;All extremities  Activity Response Tolerated fair  Mobility Referral Yes  $Mobility charge 1 Mobility   Pt declining OOB mobility despite max encouragement at this time d/t fatigue. Pt reports no OOB mobility this weekend, educated on importance and risk of debility. Pt displayed multiple bed level exercises, tolerated fair with mild back pain. Left with all needs met.   [facility] Numbers Acute Rehab Secure Chat or Office Phone: [phone]

## 2022-02-12 NOTE — TOC Progression Note (Signed)
Transition of Care [facility]) - Progression Note    Patient Details  Name: Allen Wheeler MRN: [id number] Date of Birth: 1919-04-23  Transition of Care [facility]) CM/SW [place], LCSW Phone Number: 02/12/2022, 4:55 PM  Clinical Narrative:    CSW spoke with son and will continue to follow.   Expected Discharge Plan: [place] Barriers to Discharge: Continued Medical Work up, SNF Pending bed offer  Expected Discharge Plan and Services Expected Discharge Plan: [place] In-house Referral: Clinical Social Work   Post Acute Care Choice: [place] Living arrangements for the past 2 months: Single Family Home                                       Social Determinants of Health (SDOH) Interventions    Readmission Risk Interventions     No data to display

## 2022-02-12 NOTE — Progress Notes (Signed)
PHARMACY NOTE:  ANTIMICROBIAL RENAL DOSAGE ADJUSTMENT  Current antimicrobial regimen includes a mismatch between antimicrobial dosage and estimated renal function.  As per policy approved by the Pharmacy & Therapeutics and Medical Executive Committees, the antimicrobial dosage will be adjusted accordingly.  Current antimicrobial dosage:  Daptomycin 500 mg every 24 hours + Ceftaroline 400 mg q  8 hours  Indication: Persistent MRSA bacteremia  Renal Function:  Estimated Creatinine Clearance: 25.3 mL/min (A) (by C-G formula based on SCr of 1.4 mg/dL (H)). '[]'$      On intermittent HD, scheduled: '[]'$      On CRRT    Antimicrobial dosage has been changed to:  Daptomycin 650 every 48 hours + Ceftaroline 300 mg IV Q 8 hours   Additional comments: Adjusting dose for renal function - also increased dapto to 10 mg/kg for persistent bacteremia   Thank you for allowing pharmacy to be a part of this patient's care.  Jimmy Footman, PharmD, BCPS, [place] Infectious Diseases Clinical Pharmacist Phone: [phone] 02/12/2022 9:50 AM

## 2022-02-12 NOTE — Progress Notes (Signed)
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 Though to have post viral PNA and found to have MRSA bacteremia.  -Blood Cx on 10/25+ MRSA(10/22 Bcx NG-prior admission). He was started on vanc (6 days)and transitioned to dapto+ceftaroline.  -MRI L spine negative(pt had c/o back pain) -TTE on 10/26 showed calcified aortic valve with mild aortic valve thickening. Repeat TTE on 11/4 showed moderate calcified tricuspid aortic valve with  moderate thickening.  -11/4 Bcx 2/2 MRSA Recommendations: -Continue Daptomycin and ceftaroline(Day 7). F/U daptomycin sens.  -Follow blood Cx from 11/5 to ensure clearance.  -Orthopantogram -I spoke with [name] in regards to antibiotics management. He is not interested in TEE as not wanting surgery. Relayed that patient's persistent bacteremia is secondary to lack of source control(suspect native valve endocarditis). He has failed vancomycin and now Cx are not clearing on dapt+ceftaroline. As source control is not an option we would need to attempt antibiotics alone. As such he would like to give it a couple more days prior  to allow Cx to clear if this does not occur he is amenable to discharge without IV therapy as he is interested in taking his father home(pursue comfort measures). I think a discussion with hospice in a couple of days is realistic if Cx don't clear.    Microbiology:   Antibiotics: Vancomycin 10/25-10/30 Dapto+ ceftaroline 10/31-p Cultures: Blood 10/22 NG 10/25 2/2 MRSA 10/27 MRSA 10/30 MRSA 11/1 MRSA 11/2  MRSA 11/4 1/2 MRSA    SUBJECTIVE: Resting in bed.  Interval:  Afebrile overnight.  Review of Systems: Review of Systems  All other systems reviewed and are negative.    Scheduled Meds:  acetaminophen  650 mg Oral BID   amLODipine  5 mg Oral Daily   Chlorhexidine Gluconate Cloth  6 each Topical Daily   citalopram  10 mg Oral q AM   diclofenac Sodium  2 g Topical TID   heparin  5,000 Units Subcutaneous Q8H   levothyroxine  50 mcg Oral QAC breakfast   nystatin   Topical TID   pantoprazole  40 mg Oral Daily   tamsulosin  0.8 mg Oral QPC supper   Continuous Infusions:  ceFTAROline (TEFLARO) IV 300 mg (02/12/22 1529)   [START ON 02/13/2022] DAPTOmycin (CUBICIN) 650 mg in sodium chloride 0.9 % IVPB     PRN Meds:.acetaminophen, docusate sodium, guaiFENesin, ipratropium-albuterol, LORazepam, mouth rinse, polyethylene glycol, traMADol No Known Allergies  OBJECTIVE: Vitals:   02/12/22 0600 02/12/22 0800 02/12/22 1000 02/12/22 1100  BP:  113/64  (!) 104/53  Pulse: 64 69 71 (!) 59  Resp:      Temp:  98.2 F (36.8 C)  98.4 F (36.9 C)  TempSrc:  Oral  Oral  SpO2: 97% 95% 97% 100%  Weight:      Height:       Body mass index is 23.27 kg/m.  Physical Exam Constitutional:      General: He is not in acute distress.    Appearance: He is normal weight. He is not toxic-appearing.  HENT:     Head: Normocephalic and atraumatic.     Right Ear: External ear normal.     Left Ear: External ear normal.     Nose: No congestion or rhinorrhea.     Mouth/Throat:     Mouth: Mucous membranes are moist.     Pharynx: Oropharynx is clear.  Eyes:     Extraocular Movements: Extraocular movements intact.     Conjunctiva/sclera: Conjunctivae normal.     Pupils: Pupils are equal, round, and reactive to light.  Cardiovascular:     Rate and Rhythm: Normal rate and regular rhythm.     Heart sounds: No murmur heard.    No friction rub. No gallop.  Pulmonary:     Effort: Pulmonary effort is  normal.     Breath sounds: Normal breath sounds.  Abdominal:     General: Abdomen is flat. Bowel sounds are normal.     Palpations: Abdomen is soft.  Musculoskeletal:        General: No swelling. Normal range of motion.     Cervical back: Normal range of motion and neck supple.  Skin:    General: Skin is warm and dry.  Neurological:     General: No focal deficit present.     Mental Status: He is oriented to person, place, and time.  Psychiatric:        Mood and Affect: Mood normal.       Lab Results Lab Results  Component Value Date   WBC 10.3 02/12/2022   HGB 11.2 (L) 02/12/2022   HCT 32.6 (L) 02/12/2022   MCV 91.1 02/12/2022   PLT 162 02/12/2022    Lab Results  Component Value Date   CREATININE 1.40 (H) 02/12/2022   BUN 24 (H) 02/12/2022   NA 134 (L) 02/12/2022   K 4.0 02/12/2022   CL 101 02/12/2022   CO2 18 (L) 02/12/2022    Lab Results  Component Value Date   ALT 19 02/12/2022   AST 17 02/12/2022   ALKPHOS 50 02/12/2022   BILITOT 0.7 02/12/2022        Laurice Record, MD [facility] for Infectious [facility] 02/12/2022, 3:52 PM

## 2022-02-13 ENCOUNTER — Inpatient Hospital Stay (HOSPITAL_COMMUNITY): Payer: Medicare Other

## 2022-02-13 DIAGNOSIS — B9562 Methicillin resistant Staphylococcus aureus infection as the cause of diseases classified elsewhere: Secondary | ICD-10-CM | POA: Diagnosis not present

## 2022-02-13 DIAGNOSIS — R7881 Bacteremia: Secondary | ICD-10-CM | POA: Diagnosis not present

## 2022-02-13 LAB — COMPREHENSIVE METABOLIC PANEL WITH GFR
ALT: 19 U/L (ref 0–44)
AST: 17 U/L (ref 15–41)
Albumin: 1.9 g/dL — ABNORMAL LOW (ref 3.5–5.0)
Alkaline Phosphatase: 52 U/L (ref 38–126)
Anion gap: 7 (ref 5–15)
BUN: 17 mg/dL (ref 8–23)
CO2: 21 mmol/L — ABNORMAL LOW (ref 22–32)
Calcium: 8 mg/dL — ABNORMAL LOW (ref 8.9–10.3)
Chloride: 107 mmol/L (ref 98–111)
Creatinine, Ser: 1.35 mg/dL — ABNORMAL HIGH (ref 0.61–1.24)
GFR, Estimated: 47 mL/min — ABNORMAL LOW
Glucose, Bld: 101 mg/dL — ABNORMAL HIGH (ref 70–99)
Potassium: 4 mmol/L (ref 3.5–5.1)
Sodium: 135 mmol/L (ref 135–145)
Total Bilirubin: 0.5 mg/dL (ref 0.3–1.2)
Total Protein: 5.8 g/dL — ABNORMAL LOW (ref 6.5–8.1)

## 2022-02-13 LAB — MAGNESIUM: Magnesium: 2 mg/dL (ref 1.7–2.4)

## 2022-02-13 LAB — CULTURE, BLOOD (ROUTINE X 2)
Culture: NO GROWTH
Special Requests: ADEQUATE

## 2022-02-13 LAB — CBC WITH DIFFERENTIAL/PLATELET
Abs Immature Granulocytes: 0.11 10*3/uL — ABNORMAL HIGH (ref 0.00–0.07)
Basophils Absolute: 0 10*3/uL (ref 0.0–0.1)
Basophils Relative: 0 %
Eosinophils Absolute: 0 10*3/uL (ref 0.0–0.5)
Eosinophils Relative: 0 %
HCT: 32.1 % — ABNORMAL LOW (ref 39.0–52.0)
Hemoglobin: 10.9 g/dL — ABNORMAL LOW (ref 13.0–17.0)
Immature Granulocytes: 1 %
Lymphocytes Relative: 14 %
Lymphs Abs: 1.2 10*3/uL (ref 0.7–4.0)
MCH: 31 pg (ref 26.0–34.0)
MCHC: 34 g/dL (ref 30.0–36.0)
MCV: 91.2 fL (ref 80.0–100.0)
Monocytes Absolute: 0.7 10*3/uL (ref 0.1–1.0)
Monocytes Relative: 8 %
Neutro Abs: 6.6 10*3/uL (ref 1.7–7.7)
Neutrophils Relative %: 77 %
Platelets: 160 10*3/uL (ref 150–400)
RBC: 3.52 MIL/uL — ABNORMAL LOW (ref 4.22–5.81)
RDW: 14.1 % (ref 11.5–15.5)
WBC: 8.6 10*3/uL (ref 4.0–10.5)
nRBC: 0 % (ref 0.0–0.2)

## 2022-02-13 LAB — PROCALCITONIN: Procalcitonin: <0.1 ng/mL

## 2022-02-13 LAB — C-REACTIVE PROTEIN: CRP: 18.4 mg/dL — ABNORMAL HIGH (ref <1.0)

## 2022-02-13 LAB — BRAIN NATRIURETIC PEPTIDE: B Natriuretic Peptide: 198.1 pg/mL — ABNORMAL HIGH (ref 0.0–100.0)

## 2022-02-13 MED ORDER — GABAPENTIN 100 MG PO CAPS
200.0000 mg | ORAL_CAPSULE | Freq: Every day | ORAL | Status: DC
  Administered 2022-02-13 – 2022-02-15 (×3): 200 mg via ORAL
  Filled 2022-02-13 (×3): qty 2

## 2022-02-13 NOTE — Progress Notes (Signed)
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 Though to have post viral PNA and found to have MRSA bacteremia.  -Blood Cx on 10/25+ MRSA(10/22 Bcx NG-prior admission). He was started on vanc (6 days)and transitioned to dapto+ceftaroline.  -MRI L spine negative(pt had c/o back pain) -TTE on 10/26 showed calcified aortic valve with mild aortic valve thickening. Repeat TTE on 11/4 showed moderate calcified tricuspid aortic valve with  moderate thickening.  -11/4 Bcx 2/2 MRSA Recommendations: -Continue Daptomycin and ceftaroline(Day 7). F/U daptomycin sens.  -Follow blood Cx from 11/5 to ensure clearance Ngx 1 day -Orthopantogram -I spoke with daughter in law, if Cx remain negative at 72h(11/9) then will plan to send home with PICC and IV antibiotics x 6 weeks. If Cx are positive for 11/5 will plan to stop IV antibiotics and recommend pursuing comfort measures. Both son and daughter in law relayed that they would was patient to go home when feasible. Relayed even if we sent pt home on IV therapy, prognosis is guarded.   Microbiology:   Antibiotics: Vancomycin 10/25-10/30 Dapto+ ceftaroline 10/31-p Cultures: Blood 10/22 NG 10/25 2/2 MRSA 10/27 MRSA 10/30 MRSA 11/1 MRSA 11/2 MRSA 11/4 1/2 MRSA    SUBJECTIVE: Resting in bed, daughter in law at bedside requesting update.  Interval:  Afebrile overnight.  Review of Systems: Review of Systems  All other systems reviewed and are negative.    Scheduled Meds:  acetaminophen  650 mg Oral BID   amLODipine  5  mg Oral Daily   Chlorhexidine Gluconate Cloth  6 each Topical Daily   citalopram  10 mg Oral q AM   diclofenac Sodium  2 g Topical TID   gabapentin  200 mg Oral QHS   heparin  5,000 Units Subcutaneous Q8H   levothyroxine  50 mcg Oral QAC breakfast   nystatin   Topical TID   pantoprazole  40 mg Oral Daily   tamsulosin  0.8 mg Oral QPC supper   Continuous Infusions:  ceFTAROline (TEFLARO) IV 300 mg (02/13/22 0511)   DAPTOmycin (CUBICIN) 650 mg in sodium chloride 0.9 % IVPB     PRN Meds:.acetaminophen, docusate sodium, guaiFENesin, ipratropium-albuterol, LORazepam, mouth rinse, polyethylene glycol, traMADol No Known Allergies  OBJECTIVE: Vitals:   02/13/22 0007 02/13/22 0450 02/13/22 0733 02/13/22 1137  BP: (!) 110/59 111/65 121/64 (!) 93/50  Pulse: 64 (!) 57 63 60  Resp: '18 20 20 19  '$ Temp: 98.4 F (36.9 C) (!) 97.2 F (36.2 C) 98.3 F (36.8 C) 98 F (36.7 C)  TempSrc: Oral Oral Oral Oral  SpO2: 92% 98% 96%   Weight:      Height:       Body mass index is 23.27 kg/m.  Physical Exam Constitutional:      General: He is not in acute distress.    Appearance: He is normal weight. He is not toxic-appearing.  HENT:     Head: Normocephalic and atraumatic.     Right Ear: External ear normal.     Left Ear: External ear  normal.     Nose: No congestion or rhinorrhea.     Mouth/Throat:     Mouth: Mucous membranes are moist.     Pharynx: Oropharynx is clear.  Eyes:     Extraocular Movements: Extraocular movements intact.     Conjunctiva/sclera: Conjunctivae normal.     Pupils: Pupils are equal, round, and reactive to light.  Cardiovascular:     Rate and Rhythm: Normal rate and regular rhythm.     Heart sounds: No murmur heard.    No friction rub. No gallop.  Pulmonary:     Effort: Pulmonary effort is normal.     Breath sounds: Normal breath sounds.  Abdominal:     General: Abdomen is flat. Bowel sounds are normal.     Palpations: Abdomen is soft.  Musculoskeletal:         General: No swelling. Normal range of motion.     Cervical back: Normal range of motion and neck supple.  Skin:    General: Skin is warm and dry.  Neurological:     General: No focal deficit present.     Mental Status: He is oriented to person, place, and time.  Psychiatric:        Mood and Affect: Mood normal.       Lab Results Lab Results  Component Value Date   WBC 8.6 02/13/2022   HGB 10.9 (L) 02/13/2022   HCT 32.1 (L) 02/13/2022   MCV 91.2 02/13/2022   PLT 160 02/13/2022    Lab Results  Component Value Date   CREATININE 1.35 (H) 02/13/2022   BUN 17 02/13/2022   NA 135 02/13/2022   K 4.0 02/13/2022   CL 107 02/13/2022   CO2 21 (L) 02/13/2022    Lab Results  Component Value Date   ALT 19 02/13/2022   AST 17 02/13/2022   ALKPHOS 52 02/13/2022   BILITOT 0.5 02/13/2022        Laurice Record, [facility] Infectious [facility] 02/13/2022, 2:54 PM

## 2022-02-13 NOTE — Progress Notes (Signed)
Physical Therapy Treatment Patient Details Name: Allen Wheeler MRN: [id number] DOB: Aug 08, 1919 Today's Date: 02/13/2022   History of Present Illness Patient is 88 year old male with a recent diagnosis of COVID-pneumonia, treated, discharged home then reutrned the following day due to increased lethargy. Pt in junctional bradycardia, with intermittent runs of VT and  was hypoxic and placed on nonrebreather; Pt with acute hypoxic rspoiratory failure, sever sepsis wiht acute metabolic encephalopahthy due to PNA. Pt with low back pain and MRI on 11/1 showed L1 compression fx with 10% loss of height. 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    PT Comments    Pt eager to ambulate today. Lumbar corset much more manageable than TLSO and pt tolerated well. Pt continues to fatigue with activity and seems to be fading in general. Will continue to follow for mobility as pt tolerates and wishes.    Recommendations for follow up therapy are one component of a multi-disciplinary discharge planning process, led by the attending physician.  Recommendations may be updated based on patient status, additional functional criteria and insurance authorization.  Follow Up Recommendations  Skilled nursing-short term rehab (<3 hours/day) (Pending rehab vs comfort decision) Can patient physically be transported by private vehicle: Yes   Assistance Recommended at Discharge Frequent or constant Supervision/Assistance  Patient can return home with the following A little help with walking and/or transfers;A little help with bathing/dressing/bathroom;Assistance with cooking/housework;Assist for transportation;Help with stairs or ramp for entrance   Equipment Recommendations  None recommended by PT    Recommendations for Other Services       Precautions / Restrictions Precautions Precautions: Fall;Back Required Braces or Orthoses: Spinal Brace Spinal Brace: Applied in  sitting position;Lumbar corset Restrictions Weight Bearing Restrictions: No     Mobility  Bed Mobility Overal bed mobility: Needs Assistance Bed Mobility: Sidelying to Sit, Sit to Sidelying   Sidelying to sit: Min assist, HOB elevated     Sit to sidelying: Mod assist General bed mobility comments: Assist to elevate trunk into sitting. Assist to bring legs back up into bed and control descent of trunk    Transfers Overall transfer level: Needs assistance Equipment used: Rollator (4 wheels) Transfers: Sit to/from Stand Sit to Stand: Min assist           General transfer comment: Assist to bring hips up and for balance.    Ambulation/Gait Ambulation/Gait assistance: Min assist Gait Distance (Feet): 25 Feet Assistive device: Rollator (4 wheels) Gait Pattern/deviations: Step-through pattern, Decreased stride length, Trunk flexed Gait velocity: decr Gait velocity interpretation: <1.31 ft/sec, indicative of household ambulator   General Gait Details: Assist for balance. Pt with pauses between steps. Pt reports he is moving carefully to avoid aggravating back and also appears easily fatigued.   Stairs             Wheelchair Mobility    Modified Rankin (Stroke Patients Only)       Balance Overall balance assessment: Needs assistance Sitting-balance support: Feet supported Sitting balance-Leahy Scale: Good     Standing balance support: Single extremity supported, During functional activity Standing balance-Leahy Scale: Poor Standing balance comment: UE support and min guard for static standing                            Cognition Arousal/Alertness: Awake/alert Behavior During Therapy: WFL for tasks assessed/performed Overall Cognitive Status: Within Functional Limits for tasks assessed  Exercises      General Comments General comments (skin integrity, edema, etc.): Pt on RA with  VSS.      Pertinent Vitals/Pain Pain Assessment Pain Assessment: Faces Faces Pain Scale: Hurts a little bit Pain Location: back Pain Descriptors / Indicators: Guarding Pain Intervention(s): Limited activity within patient's tolerance, Monitored during session, Repositioned, Heat applied    Home Living                          Prior Function            PT Goals (current goals can now be found in the care plan section) Progress towards PT goals: Not progressing toward goals - comment (fatigue)    Frequency    Min 2X/week      PT Plan Current plan remains appropriate    Co-evaluation              AM-PAC PT "6 Clicks" Mobility   Outcome Measure  Help needed turning from your back to your side while in a flat bed without using bedrails?: A Little Help needed moving from lying on your back to sitting on the side of a flat bed without using bedrails?: A Lot Help needed moving to and from a bed to a chair (including a wheelchair)?: A Little Help needed standing up from a chair using your arms (e.g., wheelchair or bedside chair)?: A Little Help needed to walk in hospital room?: A Little Help needed climbing 3-5 steps with a railing? : Total 6 Click Score: 15    End of Session Equipment Utilized During Treatment: Back brace Activity Tolerance: Patient limited by fatigue Patient left: with call bell/phone within reach;in bed;with bed alarm set;with family/visitor present Nurse Communication: Mobility status PT Visit Diagnosis: Unsteadiness on feet (R26.81);Other abnormalities of gait and mobility (R26.89);Muscle weakness (generalized) (M62.81);Difficulty in walking, not elsewhere classified (R26.2)     Time: 8177-1165 PT Time Calculation (min) (ACUTE ONLY): 19 min  Charges:  $Gait Training: 8-22 mins                     [place] [facility] [place] 02/13/2022, 1:58 PM

## 2022-02-13 NOTE — Progress Notes (Signed)
PHARMACY - PHYSICIAN COMMUNICATION CRITICAL VALUE ALERT - BLOOD CULTURE IDENTIFICATION (BCID)  Allen Wheeler is an 88 y.o. male who presented to [facility] on 01/31/2022 with a chief complaint of hypoxia and history of COVID hospitalization.   Assessment:  MRSA bacteremia. Repeat cultures from 11/6 show [place] in clusters  Current antibiotics: ceftaroline, daptomycin   Changes to prescribed antibiotics recommended:  Patient is on recommended antibiotics - No changes needed  Results for orders placed or performed during the hospital encounter of 01/31/22  Blood Culture ID Panel (Reflexed) (Collected: 01/31/2022  6:22 PM)  Result Value Ref Range   Enterococcus faecalis NOT DETECTED NOT DETECTED   Enterococcus Faecium NOT DETECTED NOT DETECTED   Listeria monocytogenes NOT DETECTED NOT DETECTED   Staphylococcus species DETECTED (A) NOT DETECTED   Staphylococcus aureus (BCID) DETECTED (A) NOT DETECTED   Staphylococcus epidermidis NOT DETECTED NOT DETECTED   Staphylococcus lugdunensis NOT DETECTED NOT DETECTED   Streptococcus species NOT DETECTED NOT DETECTED   Streptococcus agalactiae NOT DETECTED NOT DETECTED   Streptococcus pneumoniae NOT DETECTED NOT DETECTED   Streptococcus pyogenes NOT DETECTED NOT DETECTED   A.calcoaceticus-baumannii NOT DETECTED NOT DETECTED   Bacteroides fragilis NOT DETECTED NOT DETECTED   Enterobacterales NOT DETECTED NOT DETECTED   Enterobacter cloacae complex NOT DETECTED NOT DETECTED   Escherichia coli NOT DETECTED NOT DETECTED   Klebsiella aerogenes NOT DETECTED NOT DETECTED   Klebsiella oxytoca NOT DETECTED NOT DETECTED   Klebsiella pneumoniae NOT DETECTED NOT DETECTED   Proteus species NOT DETECTED NOT DETECTED   Salmonella species NOT DETECTED NOT DETECTED   Serratia marcescens NOT DETECTED NOT DETECTED   Haemophilus influenzae NOT DETECTED NOT DETECTED   Neisseria meningitidis NOT DETECTED NOT DETECTED   Pseudomonas aeruginosa NOT DETECTED NOT  DETECTED   Stenotrophomonas maltophilia NOT DETECTED NOT DETECTED   Candida albicans NOT DETECTED NOT DETECTED   Candida auris NOT DETECTED NOT DETECTED   Candida glabrata NOT DETECTED NOT DETECTED   Candida krusei NOT DETECTED NOT DETECTED   Candida parapsilosis NOT DETECTED NOT DETECTED   Candida tropicalis NOT DETECTED NOT DETECTED   Cryptococcus neoformans/gattii NOT DETECTED NOT DETECTED   Meth resistant mecA/C and MREJ DETECTED (A) NOT DETECTED    Hildred Laser, PharmD Clinical Pharmacist **Pharmacist phone directory can now be found on [email or web address] ([email or web address]).  Listed under [place].

## 2022-02-13 NOTE — Progress Notes (Signed)
PROGRESS NOTE                                                                                                                                                                                                             Patient Demographics:    Allen Wheeler, is a 88 y.o. male, DOB - 11-30-1919, MPN:[id number]  Outpatient Primary MD for the patient is [name], MD    LOS - 13  Admit date - 01/31/2022    Chief Complaint  Patient presents with   Code Sepsis       Brief Narrative (HPI from H&P)   88 y.o. M with PMH of GERD, depression/anxiety and discharged home yesterday after being hospitalized with Covid PNA.   He recently visited [place] to celebrate his 89th birthday and became ill after traveling home.  He was admitted 10/22-10/23 and treated with Molnupavir.  After discharge home, family states he initially seemed to be doing well, however slept most of the day today and was increasingly lethargic and high fever, in the ER he was found to have bacterial right lower lobe pneumonia, further work-up showed MRSA bacteremia, in the ER he also had a run of V. tach.  He was admitted by PCCM, in ICU he had Foley catheter placement by urology, he was stabilized and transferred to my care on 02/04/2022 on day 4 of hospital stay.   Subjective:   Patient in bed denies any headache chest or abdominal pain, no shortness of breath, improving lower back pain.   Assessment  & Plan :   Severe sepsis, MRSA bacteremia after recent episode of COVID-pneumonia likely due to bacterial infection likely due to aspiration pneumonia caused at home. he rightly got weak and deconditioned due to recent COVID-19 infection and aspirated, developed bacterial pneumonia and now persistent MRSA bacteremia.  Has been seen by ID and PCCM, initially required BiPAP, now stable on 2 L nasal cannula oxygen, request speech to evaluate.  Antibiotics per ID  currently on combination of daptomycin and Teflaro, initial Echocardiogram does not show any evidence of endocarditis sepsis pathophysiology, due to persistent bacteremia echocardiogram was repeated on 02/10/2022 showing inconclusive aortic and mitral valve changes in the presence of advanced degeneration likely age-related, being treated empirically for endocarditis.  Also MRI L-spine was not suggestive of infection per neurosurgery.  Appears nontoxic  but does have persistent bacteremia.  Continue on antibiotics and monitor.  Son agreeable for placing a PICC line with long-term antibiotics or Oral ABX in the outpatient setting, agree that this is not an ideal treatment plan however he is leaning more towards quality of life and keeping him comfortable, if patient declines son is agreeable for full comfort measures/hospice.  Acute hypoxic respiratory failure due to pneumonia as above.  Much improved.  Initially required BiPAP now on nasal cannula, have speech evaluate, antibiotics for bacterial pneumonia, steroids for recent COVID infection.  Speech to evaluate to rule out ongoing aspiration.  Monitor closely.  Overall improving.  V.Tach in the ER.  Likely due to sepsis.  Resolved after amiodarone.  Avoiding beta-blocker or nodal agents due to underlying history of bradycardia and heart block.  Monitor electrolytes.  Junctional bradycardia with possible history of underlying second-degree AV block Mobitz type I.  Avoiding nodal agents.  Not a candidate for pacemaker due to bacteremia.  TSH is stable.  Continue to monitor on telemetry.  Blood pressure stable.  She had cardiology input discussed with Dr. Harrington Challenger on 02/06/2022.  No further work-up or intervention as suggested by cardiology.  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  Gentle oral Lasix x1 on 02/04/2022, placed on low-dose hydralazine and Imdur if blood pressure permits, avoiding ACE/ARB due to recent AKI, no beta-blocker  due to bradycardia and possible underlying second-degree AV block Mobitz type I.  Foley catheter trauma in ICU causing hematuria, underlying history of BPH.  Foley catheter in ICU was pulled due to patient's delirium, urology was consulted they have placed a Foley catheter on 02/03/2022.  Continue Flomax likely will be discharged home with Foley catheter with outpatient urology follow-up.  CKD 3B.  Baseline creatinine around 1.4.  Monitor.  Hypothyroidism.  On Synthroid continue at home dose, TSH stable.  Hypertension.  Placed on low-dose Norvasc and monitor.  Dull low back pain.  Ongoing for 1 to 2 days.  Patient actually unsure about the duration.  MRI obtained in the setting of bacteremia, possible small L1 fracture with nonspecific surrounding edema, case discussed with Dr. Kathyrn Sheriff on 02/08/2022, he reviewed the MRI and agrees there is no infection, he agreed for supportive care for now, will add TLSO brace when getting out of the bed.  Add NSAID cream for supportive care.       Condition - Extremely Guarded  Family Communication  :    Son on [name] [phone] 02/04/22, 02/06/2022, 02/07/2022 at 7:49 AM message left, called on 02/11/2022 8:14 AM and message left  Code Status :  No CPR or intubation, discussed with son on 02/04/2022, 02/06/22.  Consults  : PCCM, cardiology, urology, neurosurgeon Dr. Kathyrn Sheriff over the phone on 02/07/2022  PUD Prophylaxis :  PPI   Procedures  :     MRI - 1. Acute to subacute compression fracture involving the inferior aspect of L1 with mild 10% height loss without bony retropulsion. 2. Underlying multilevel degenerative disc disease and facet hypertrophy with resultant mild to moderate spinal stenosis at L2-3 through L4-5, with mild-to-moderate multilevel foraminal narrowing as above. 3. Mild edema within the right psoas muscle, favored to be secondary to the L1 compression fracture, with infection felt to be unlikely. No other evidence for acute  infection elsewhere within the lumbar spine. No collections. 4. Aneurysmal dilatation of the infrarenal aorta up to 3.9 cm. Recommend follow-up every 2 years. This recommendation follows ACR consensus guidelines: White  Paper of the ACR Incidental Findings Committee II on Vascular Findings. J Am Coll Radiol 2013; 10:789-794. 5. Mild distension of the urinary bladder with a Foley catheter in place. Bedside check to ensure adequate placement and functioning suggested  TTE 02/01/22 - 1. Left ventricular ejection fraction, by estimation, is 40 to 45%. The left ventricle has mildly decreased function. The left ventricle demonstrates global hypokinesis. There is moderate asymmetric left ventricular hypertrophy of the septal segment. Left ventricular diastolic parameters are consistent with Grade I diastolic dysfunction (impaired relaxation).  2. Right ventricular systolic function is normal. The right ventricular size is normal.  3. Left atrial size was mildly dilated.  4. The mitral valve is normal in structure. Mild mitral valve regurgitation. No evidence of mitral stenosis.  5. The aortic valve is calcified. There is moderate calcification of the aortic valve. There is mild thickening of the aortic valve. Aortic valve regurgitation is not visualized. Aortic valve sclerosis/calcification is present, without any evidence of aortic stenosis. Aortic valve area, by VTI measures 2.00 cm. Aortic valve mean gradient measures 8.0 mmHg. Aortic valve Vmax measures 1.96 m/s.  6. The inferior vena cava is normal in size with greater than 50% respiratory variability, suggesting right atrial pressure of 3 mmHg.  Repeat TTE 02/11/22 -  1. Unable to determine if valvular vegetations present due to significant degenerative valvular changes with calcificaiton of both the aortic and mitral valves. If clinical suspicion for infective endocarditis, would consider empiric treatment.  2. Left ventricular ejection fraction, by estimation,  is 50 to 55%. The left ventricle has low normal function.  3. The mitral valve is degenerative. Trivial mitral valve regurgitation. Severe mitral annular calcification.  4. The aortic valve is tricuspid. There is moderate calcification of the aortic valve. There is moderate thickening of the aortic valve. Aortic valve regurgitation is trivial. There is aortic sclerosis/calcification without significant stenosis. Mean AoV gradient 5mHg with Vmax 245m      Disposition Plan  :  Status is: Inpatient  DVT Prophylaxis  :    heparin injection 5,000 Units Start: 01/31/22 2200 SCDs Start: 01/31/22 2105   Lab Results  Component Value Date   PLT 160 02/13/2022    Diet :  Diet Order             Diet regular Room service appropriate? Yes with Assist; Fluid consistency: Thin  Diet effective now                    Inpatient Medications  Scheduled Meds:  acetaminophen  650 mg Oral BID   amLODipine  5 mg Oral Daily   Chlorhexidine Gluconate Cloth  6 each Topical Daily   citalopram  10 mg Oral q AM   diclofenac Sodium  2 g Topical TID   heparin  5,000 Units Subcutaneous Q8H   levothyroxine  50 mcg Oral QAC breakfast   nystatin   Topical TID   pantoprazole  40 mg Oral Daily   tamsulosin  0.8 mg Oral QPC supper   Continuous Infusions:  ceFTAROline (TEFLARO) IV 300 mg (02/13/22 0511)   DAPTOmycin (CUBICIN) 650 mg in sodium chloride 0.9 % IVPB     PRN Meds:.acetaminophen, docusate sodium, guaiFENesin, ipratropium-albuterol, LORazepam, mouth rinse, polyethylene glycol, traMADol    Objective:   Vitals:   02/12/22 2000 02/13/22 0007 02/13/22 0450 02/13/22 0733  BP: 125/60 (!) 110/59 111/65 121/64  Pulse: 68 64 (!) 57 63  Resp: '20 18 20 20  '$ Temp: 98.4 F (  36.9 C) 98.4 F (36.9 C) (!) 97.2 F (36.2 C) 98.3 F (36.8 C)  TempSrc: Oral Oral Oral Oral  SpO2: 96% 92% 98% 96%  Weight:      Height:        Wt Readings from Last 3 Encounters:  02/11/22 65.4 kg  05/15/21 70.4 kg   05/12/21 72.1 kg     Intake/Output Summary (Last 24 hours) at 02/13/2022 1950 Last data filed at 02/13/2022 0455 Gross per 24 hour  Intake 137.41 ml  Output 725 ml  Net -587.59 ml     Physical Exam  Awake Alert, No new F.N deficits, Normal affect [place].AT,PERRAL Supple Neck, No JVD,   Symmetrical Chest wall movement, Good air movement bilaterally, CTAB RRR,No Gallops, Rubs or new Murmurs,  +ve B.Sounds, Abd Soft, No tenderness,   No Cyanosis, Clubbing or edema   RN pressure injury documentation: Pressure Injury 02/01/22 Buttocks Right;Left Stage 1 -  Intact skin with non-blanchable redness of a localized area usually over a bony prominence. (Active)  02/01/22 0055  Location: Buttocks  Location Orientation: Right;Left  Staging: Stage 1 -  Intact skin with non-blanchable redness of a localized area usually over a bony prominence.  Wound Description (Comments):   Present on Admission:   Dressing Type Foam - Lift dressing to assess site every shift 02/12/22 2000      Data Review:    CBC Recent Labs  Lab 02/08/22 0643 02/10/22 0229 02/11/22 0320 02/12/22 0422 02/13/22 0424  WBC 9.5 9.8 8.8 10.3 8.6  HGB 12.1* 11.1* 11.0* 11.2* 10.9*  HCT 36.6* 33.0* 32.3* 32.6* 32.1*  PLT 213 180 145* 162 160  MCV 92.2 91.9 91.8 91.1 91.2  MCH 30.5 30.9 31.3 31.3 31.0  MCHC 33.1 33.6 34.1 34.4 34.0  RDW 14.2 14.3 14.2 14.0 14.1  LYMPHSABS 1.1 1.4 1.2 1.3 1.2  MONOABS 0.4 0.6 0.4 0.6 0.7  EOSABS 0.0 0.0 0.0 0.0 0.0  BASOSABS 0.0 0.0 0.0 0.0 0.0    Electrolytes Recent Labs  Lab 02/07/22 0600 02/08/22 0302 02/10/22 0229 02/11/22 0320 02/12/22 0422 02/13/22 0424  NA 133* 133* 134* 132* 134* 135  K 4.3 5.1 4.1 3.9 4.0 4.0  CL 103 102 103 103 101 107  CO2 19* 21* 21* 18* 18* 21*  GLUCOSE 125* 132* 89 178* 97 101*  BUN '23 22 21 23 '$ 24* 17  CREATININE 1.14 1.22 1.27* 1.46* 1.40* 1.35*  CALCIUM 8.0* 8.0* 8.0* 8.1* 8.4* 8.0*  AST '15 18 15 20 17 17  '$ ALT '21 19 19 19 19 19   '$ ALKPHOS 49 46 40 48 50 52  BILITOT 1.0 0.5 0.6 0.5 0.7 0.5  ALBUMIN 2.1* 2.0* 1.9* 1.8* 1.9* 1.9*  MG 1.9  --  1.8 1.7 2.0 2.0  CRP 12.4*  --  13.0* 13.4* 15.2* 18.4*  PROCALCITON 0.18 0.10 <0.10 <0.10 <0.10 <0.10  BNP 478.6*  --  209.2* 204.1* 260.5* 198.1*    ------------------------------------------------------------------------------------------------------------------ No results for input(s): "CHOL", "HDL", "LDLCALC", "TRIG", "CHOLHDL", "LDLDIRECT" in the last 72 hours.  Lab Results  Component Value Date   HGBA1C 5.9 (H) 01/31/2022    No results for input(s): "TSH", "T4TOTAL", "T3FREE", "THYROIDAB" in the last 72 hours.  Invalid input(s): "FREET3"    Radiology Reports DG Chest Port 1 View  Result Date: 02/11/2022 CLINICAL DATA:  Shortness of breath EXAM: PORTABLE CHEST 1 VIEW COMPARISON:  Yesterday FINDINGS: Cardiomegaly and aortic tortuosity accentuated by rotation. Indistinct opacity at the lung bases 1 non progressed. No  visible effusion or pneumothorax. IMPRESSION: Stable pulmonary infiltrates. Electronically Signed   By: Jorje Guild M.D.   On: 02/11/2022 07:41   ECHOCARDIOGRAM LIMITED  Result Date: 02/10/2022    ECHOCARDIOGRAM LIMITED REPORT   Patient Name:   Allen Wheeler Date of Exam: 02/10/2022 Medical Rec #:  [id number]        Height:       66.0 in Accession #:    [id number]       Weight:       143.5 lb Date of Birth:  07/28/1919       BSA:          1.737 m Patient Age:    89 years        BP:           126/106 mmHg Patient Gender: M                HR:           55 bpm. Exam Location:  Inpatient Procedure: Limited Echo, Color Doppler and Cardiac Doppler Indications:    Bacteremia  History:        Patient has prior history of Echocardiogram examinations.  Sonographer:    Johny Chess RDCS Referring Phys: [id number] [facility]  Sonographer Comments: Image acquisition challenging due to respiratory motion. IMPRESSIONS  1. Unable to determine if valvular  vegetations present due to significant degenerative valvular changes with calcificaiton of both the aortic and mitral valves. If clinical suspicion for infective endocarditis, would consider empiric treatment.  2. Left ventricular ejection fraction, by estimation, is 50 to 55%. The left ventricle has low normal function.  3. The mitral valve is degenerative. Trivial mitral valve regurgitation. Severe mitral annular calcification.  4. The aortic valve is tricuspid. There is moderate calcification of the aortic valve. There is moderate thickening of the aortic valve. Aortic valve regurgitation is trivial. There is aortic sclerosis/calcification without significant stenosis. Mean AoV gradient 76mHg with Vmax 231m. FINDINGS  Left Ventricle: Left ventricular ejection fraction, by estimation, is 50 to 55%. The left ventricle has low normal function. Pericardium: There is no evidence of pericardial effusion. Mitral Valve: The mitral valve is degenerative in appearance. Severe mitral annular calcification. Trivial mitral valve regurgitation. Tricuspid Valve: The tricuspid valve is grossly normal. Tricuspid valve regurgitation is trivial. Aortic Valve: The aortic valve is tricuspid. There is moderate calcification of the aortic valve. There is moderate thickening of the aortic valve. Aortic valve regurgitation is trivial. Mild aortic stenosis is present. Aortic valve mean gradient measures 8.5 mmHg. Aortic valve peak gradient measures 16.3 mmHg. Pulmonic Valve: The pulmonic valve was grossly normal. Pulmonic valve regurgitation is trivial. Additional Comments: Spectral Doppler performed. Color Doppler performed.  AORTIC VALVE AV Vmax:           202.00 cm/s AV Vmean:          132.000 cm/s AV VTI:            0.416 m AV Peak Grad:      16.3 mmHg AV Mean Grad:      8.5 mmHg LVOT Vmax:         90.45 cm/s LVOT Vmean:        54.000 cm/s LVOT VTI:          0.195 m LVOT/AV VTI ratio: 0.47  SHUNTS Systemic VTI: 0.20 m HeGwyndolyn KaufmanD Electronically signed by HeGwyndolyn KaufmanD Signature Date/Time: 02/10/2022/1:29:44 PM    Final    DG  Chest Port 1 View  Result Date: 02/10/2022 CLINICAL DATA:  Shortness of breath EXAM: PORTABLE CHEST 1 VIEW COMPARISON:  02/04/2022 FINDINGS: Normal heart size. Mediastinal widening primarily from aortic tortuosity, accentuated by scoliosis. Infiltrates at the lung bases are unchanged. No visible effusion or pneumothorax. IMPRESSION: Unchanged bilateral pneumonia. Electronically Signed   By: Jorje Guild M.D.   On: 02/10/2022 08:32      Signature  Lala Lund M.D on 02/13/2022 at 9:22 AM   -  To page go to [email or web address]

## 2022-02-14 DIAGNOSIS — B9562 Methicillin resistant Staphylococcus aureus infection as the cause of diseases classified elsewhere: Secondary | ICD-10-CM | POA: Diagnosis not present

## 2022-02-14 DIAGNOSIS — R7881 Bacteremia: Secondary | ICD-10-CM | POA: Diagnosis not present

## 2022-02-14 LAB — COMPREHENSIVE METABOLIC PANEL
ALT: 19 U/L (ref 0–44)
AST: 17 U/L (ref 15–41)
Albumin: 1.8 g/dL — ABNORMAL LOW (ref 3.5–5.0)
Alkaline Phosphatase: 52 U/L (ref 38–126)
Anion gap: 7 (ref 5–15)
BUN: 18 mg/dL (ref 8–23)
CO2: 19 mmol/L — ABNORMAL LOW (ref 22–32)
Calcium: 7.8 mg/dL — ABNORMAL LOW (ref 8.9–10.3)
Chloride: 108 mmol/L (ref 98–111)
Creatinine, Ser: 1.33 mg/dL — ABNORMAL HIGH (ref 0.61–1.24)
GFR, Estimated: 48 mL/min — ABNORMAL LOW (ref >60)
Glucose, Bld: 96 mg/dL (ref 70–99)
Potassium: 4 mmol/L (ref 3.5–5.1)
Sodium: 134 mmol/L — ABNORMAL LOW (ref 135–145)
Total Bilirubin: 0.6 mg/dL (ref 0.3–1.2)
Total Protein: 5.6 g/dL — ABNORMAL LOW (ref 6.5–8.1)

## 2022-02-14 LAB — CBC WITH DIFFERENTIAL/PLATELET
Abs Immature Granulocytes: 0.07 10*3/uL (ref 0.00–0.07)
Basophils Absolute: 0 10*3/uL (ref 0.0–0.1)
Basophils Relative: 0 %
Eosinophils Absolute: 0 10*3/uL (ref 0.0–0.5)
Eosinophils Relative: 0 %
HCT: 29.3 % — ABNORMAL LOW (ref 39.0–52.0)
Hemoglobin: 9.7 g/dL — ABNORMAL LOW (ref 13.0–17.0)
Immature Granulocytes: 1 %
Lymphocytes Relative: 14 %
Lymphs Abs: 1.2 10*3/uL (ref 0.7–4.0)
MCH: 30.5 pg (ref 26.0–34.0)
MCHC: 33.1 g/dL (ref 30.0–36.0)
MCV: 92.1 fL (ref 80.0–100.0)
Monocytes Absolute: 0.8 10*3/uL (ref 0.1–1.0)
Monocytes Relative: 9 %
Neutro Abs: 6.5 10*3/uL (ref 1.7–7.7)
Neutrophils Relative %: 76 %
Platelets: 153 10*3/uL (ref 150–400)
RBC: 3.18 MIL/uL — ABNORMAL LOW (ref 4.22–5.81)
RDW: 14.1 % (ref 11.5–15.5)
WBC: 8.6 10*3/uL (ref 4.0–10.5)
nRBC: 0 % (ref 0.0–0.2)

## 2022-02-14 LAB — PROCALCITONIN: Procalcitonin: <0.1 ng/mL

## 2022-02-14 LAB — CULTURE, BLOOD (ROUTINE X 2)

## 2022-02-14 LAB — CK: Total CK: 42 U/L — ABNORMAL LOW (ref 49–397)

## 2022-02-14 LAB — C-REACTIVE PROTEIN: CRP: 18.2 mg/dL — ABNORMAL HIGH (ref <1.0)

## 2022-02-14 LAB — BRAIN NATRIURETIC PEPTIDE: B Natriuretic Peptide: 216.6 pg/mL — ABNORMAL HIGH (ref 0.0–100.0)

## 2022-02-14 LAB — UREA NITROGEN, URINE: Urea Nitrogen, Ur: 666 mg/dL

## 2022-02-14 LAB — MAGNESIUM: Magnesium: 1.9 mg/dL (ref 1.7–2.4)

## 2022-02-14 NOTE — Progress Notes (Signed)
Mobility Specialist Progress Note   02/14/22 0950  Mobility  Activity Turned to right side;Turned to left side;Ambulated with assistance in room  Level of Assistance Minimal assist, patient does 75% or more  Assistive Device Front wheel walker  Distance Ambulated (ft) 20 ft  Range of Motion/Exercises Active;All extremities  Activity Response Tolerated well   Patient received in supine, agreeable to participate. Stated he just had a BM and requested assistance to be cleaned up first. Required total A for pericare, pt able to log roll with minimal assistance. Patient then got to EOB with minimal HHA and lumbar corset was donned with total A. Requires min A to stand + cues for hand placement and min G to ambulate with steady gait. Tolerated without complaint or incident. Required min A to assist patient back in bed. Was left in supine with all needs met, call bell in reach.   Allen Wheeler, Rachel, [place]  Office: [phone]

## 2022-02-14 NOTE — Progress Notes (Signed)
PROGRESS NOTE                                                                                                                                                                                                             Patient Demographics:    Allen Wheeler, is a 88 y.o. male, DOB - Jan 11, 1920, PJA:[id number]  Outpatient Primary MD for the patient is Allen Morale, MD    LOS - 14  Admit date - 01/31/2022    Chief Complaint  Patient presents with   Code Sepsis       Brief Narrative (HPI from H&P)   88 y.o. M with PMH of GERD, depression/anxiety and discharged home yesterday after being hospitalized with Covid PNA.   He recently visited [place] to celebrate his 89th birthday and became ill after traveling home.  He was admitted 10/22-10/23 and treated with Molnupavir.  After discharge home, family states he initially seemed to be doing well, however slept most of the day today and was increasingly lethargic and high fever, in the ER he was found to have bacterial right lower lobe pneumonia, further work-up showed MRSA bacteremia, in the ER he also had a run of V. tach.  He was admitted by PCCM, in ICU he had Foley catheter placement by urology, he was stabilized and transferred to my care on 02/04/2022 on day 4 of hospital stay.   Subjective:   Patient in bed denies any headache chest or abdominal pain, no shortness of breath, improving lower back pain.   Assessment  & Plan :   Severe sepsis, MRSA bacteremia after recent episode of COVID-pneumonia likely due to bacterial infection likely due to aspiration pneumonia caused at home. he rightly got weak and deconditioned due to recent COVID-19 infection and aspirated, developed bacterial pneumonia and now persistent MRSA bacteremia.  Has been seen by ID and PCCM, initially required BiPAP, now stable on 2 L nasal cannula oxygen, request speech to evaluate.  Antibiotics per ID  currently on combination of daptomycin and Teflaro, initial Echocardiogram does not show any evidence of endocarditis sepsis pathophysiology, due to persistent bacteremia echocardiogram was repeated on 02/10/2022 showing inconclusive aortic and mitral valve changes in the presence of advanced degeneration likely age-related, being treated empirically for endocarditis.  Also MRI L-spine was not suggestive of infection per neurosurgery.  Appears nontoxic  but does have persistent bacteremia.  Continue on antibiotics and monitor.  Son agreeable for placing a PICC line with long-term antibiotics or Oral ABX in the outpatient setting, agree that this is not an ideal treatment plan however he is leaning more towards quality of life and keeping him comfortable, if patient declines son is agreeable for full comfort measures/hospice.  Acute hypoxic respiratory failure due to pneumonia as above.  Much improved.  Initially required BiPAP now on nasal cannula, have speech evaluate, antibiotics for bacterial pneumonia, steroids for recent COVID infection.  Speech to evaluate to rule out ongoing aspiration.  Monitor closely.  Overall improving.  V.Tach in the ER.  Likely due to sepsis.  Resolved after amiodarone.  Avoiding beta-blocker or nodal agents due to underlying history of bradycardia and heart block.  Monitor electrolytes.  Junctional bradycardia with possible history of underlying second-degree AV block Mobitz type I.  Avoiding nodal agents.  Not a candidate for pacemaker due to bacteremia.  TSH is stable.  Continue to monitor on telemetry.  Blood pressure stable.  She had cardiology input discussed with Dr. Harrington Challenger on 02/06/2022.  No further work-up or intervention as suggested by cardiology.  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  Gentle oral Lasix x1 on 02/04/2022, placed on low-dose hydralazine and Imdur if blood pressure permits, avoiding ACE/ARB due to recent AKI, no beta-blocker  due to bradycardia and possible underlying second-degree AV block Mobitz type I.  Foley catheter trauma in ICU causing hematuria, underlying history of BPH.  Foley catheter in ICU was pulled due to patient's delirium, urology was consulted they have placed a Foley catheter on 02/03/2022.  Continue Flomax likely will be discharged home with Foley catheter with outpatient urology follow-up.  CKD 3B.  Baseline creatinine around 1.4.  Monitor.  Hypothyroidism.  On Synthroid continue at home dose, TSH stable.  Hypertension.  Placed on low-dose Norvasc and monitor.  Dull low back pain.  Ongoing for 1 to 2 days.  Patient actually unsure about the duration.  MRI obtained in the setting of bacteremia, possible small L1 fracture with nonspecific surrounding edema, case discussed with Dr. Kathyrn Sheriff on 02/08/2022, he reviewed the MRI and agrees there is no infection, he agreed for supportive care for now, will add TLSO brace when getting out of the bed.  Add NSAID cream for supportive care.       Condition - Extremely Guarded  Family Communication  :    Son on [name] [phone] 02/04/22, 02/06/2022, 02/07/2022 at 7:49 AM message left, called on 02/11/2022 8:14 AM and message left, updated 02/12/2022, called 02/14/2022 10:27 AM message left  Code Status :  No CPR or intubation, discussed with son on 02/04/2022, 02/06/22.  Consults  : PCCM, cardiology, urology, neurosurgeon Dr. Kathyrn Sheriff over the phone on 02/07/2022  PUD Prophylaxis :  PPI   Procedures  :     MRI - 1. Acute to subacute compression fracture involving the inferior aspect of L1 with mild 10% height loss without bony retropulsion. 2. Underlying multilevel degenerative disc disease and facet hypertrophy with resultant mild to moderate spinal stenosis at L2-3 through L4-5, with mild-to-moderate multilevel foraminal narrowing as above. 3. Mild edema within the right psoas muscle, favored to be secondary to the L1 compression fracture, with  infection felt to be unlikely. No other evidence for acute infection elsewhere within the lumbar spine. No collections. 4. Aneurysmal dilatation of the infrarenal aorta up to 3.9 cm. Recommend follow-up every 2  years. This recommendation follows ACR consensus guidelines: White Paper of the ACR Incidental Findings Committee II on Vascular Findings. J Am Coll Radiol 2013; 10:789-794. 5. Mild distension of the urinary bladder with a Foley catheter in place. Bedside check to ensure adequate placement and functioning suggested  TTE 02/01/22 - 1. Left ventricular ejection fraction, by estimation, is 40 to 45%. The left ventricle has mildly decreased function. The left ventricle demonstrates global hypokinesis. There is moderate asymmetric left ventricular hypertrophy of the septal segment. Left ventricular diastolic parameters are consistent with Grade I diastolic dysfunction (impaired relaxation).  2. Right ventricular systolic function is normal. The right ventricular size is normal.  3. Left atrial size was mildly dilated.  4. The mitral valve is normal in structure. Mild mitral valve regurgitation. No evidence of mitral stenosis.  5. The aortic valve is calcified. There is moderate calcification of the aortic valve. There is mild thickening of the aortic valve. Aortic valve regurgitation is not visualized. Aortic valve sclerosis/calcification is present, without any evidence of aortic stenosis. Aortic valve area, by VTI measures 2.00 cm. Aortic valve mean gradient measures 8.0 mmHg. Aortic valve Vmax measures 1.96 m/s.  6. The inferior vena cava is normal in size with greater than 50% respiratory variability, suggesting right atrial pressure of 3 mmHg.  Repeat TTE 02/11/22 -  1. Unable to determine if valvular vegetations present due to significant degenerative valvular changes with calcificaiton of both the aortic and mitral valves. If clinical suspicion for infective endocarditis, would consider empiric  treatment.  2. Left ventricular ejection fraction, by estimation, is 50 to 55%. The left ventricle has low normal function.  3. The mitral valve is degenerative. Trivial mitral valve regurgitation. Severe mitral annular calcification.  4. The aortic valve is tricuspid. There is moderate calcification of the aortic valve. There is moderate thickening of the aortic valve. Aortic valve regurgitation is trivial. There is aortic sclerosis/calcification without significant stenosis. Mean AoV gradient 16mHg with Vmax 224m      Disposition Plan  :  Status is: Inpatient  DVT Prophylaxis  :    heparin injection 5,000 Units Start: 01/31/22 2200 SCDs Start: 01/31/22 2105   Lab Results  Component Value Date   PLT 153 02/14/2022    Diet :  Diet Order             Diet regular Room service appropriate? Yes with Assist; Fluid consistency: Thin  Diet effective now                    Inpatient Medications  Scheduled Meds:  acetaminophen  650 mg Oral BID   amLODipine  5 mg Oral Daily   Chlorhexidine Gluconate Cloth  6 each Topical Daily   citalopram  10 mg Oral q AM   diclofenac Sodium  2 g Topical TID   gabapentin  200 mg Oral QHS   heparin  5,000 Units Subcutaneous Q8H   levothyroxine  50 mcg Oral QAC breakfast   nystatin   Topical TID   pantoprazole  40 mg Oral Daily   tamsulosin  0.8 mg Oral QPC supper   Continuous Infusions:  ceFTAROline (TEFLARO) IV 300 mg (02/14/22 062130  DAPTOmycin (CUBICIN) 650 mg in sodium chloride 0.9 % IVPB 650 mg (02/13/22 2125)   PRN Meds:.acetaminophen, docusate sodium, guaiFENesin, ipratropium-albuterol, LORazepam, mouth rinse, polyethylene glycol, traMADol    Objective:   Vitals:   02/13/22 2000 02/14/22 0058 02/14/22 0649 02/14/22 0812  BP: 128/71 (!) 103/53 114/60  121/60  Pulse: 70 64 (!) 59 (!) 57  Resp:  '16 16 17  '$ Temp: 99.5 F (37.5 C) 98.9 F (37.2 C) 97.8 F (36.6 C) (!) 97.5 F (36.4 C)  TempSrc: Oral   Oral  SpO2:  92% 93%    Weight:      Height:        Wt Readings from Last 3 Encounters:  02/11/22 65.4 kg  05/15/21 70.4 kg  05/12/21 72.1 kg     Intake/Output Summary (Last 24 hours) at 02/14/2022 1028 Last data filed at 02/14/2022 0643 Gross per 24 hour  Intake --  Output 1700 ml  Net -1700 ml     Physical Exam  Awake Alert, No new F.N deficits, Normal affect [place].AT,PERRAL Supple Neck, No JVD,   Symmetrical Chest wall movement, Good air movement bilaterally, CTAB RRR,No Gallops, Rubs or new Murmurs,  +ve B.Sounds, Abd Soft, No tenderness,   No Cyanosis, Clubbing or edema   RN pressure injury documentation: Pressure Injury 02/01/22 Buttocks Right;Left Stage 1 -  Intact skin with non-blanchable redness of a localized area usually over a bony prominence. (Active)  02/01/22 0055  Location: Buttocks  Location Orientation: Right;Left  Staging: Stage 1 -  Intact skin with non-blanchable redness of a localized area usually over a bony prominence.  Wound Description (Comments):   Present on Admission:   Dressing Type Foam - Lift dressing to assess site every shift 02/13/22 2000      Data Review:    CBC Recent Labs  Lab 02/10/22 0229 02/11/22 0320 02/12/22 0422 02/13/22 0424 02/14/22 0634  WBC 9.8 8.8 10.3 8.6 8.6  HGB 11.1* 11.0* 11.2* 10.9* 9.7*  HCT 33.0* 32.3* 32.6* 32.1* 29.3*  PLT 180 145* 162 160 153  MCV 91.9 91.8 91.1 91.2 92.1  MCH 30.9 31.3 31.3 31.0 30.5  MCHC 33.6 34.1 34.4 34.0 33.1  RDW 14.3 14.2 14.0 14.1 14.1  LYMPHSABS 1.4 1.2 1.3 1.2 1.2  MONOABS 0.6 0.4 0.6 0.7 0.8  EOSABS 0.0 0.0 0.0 0.0 0.0  BASOSABS 0.0 0.0 0.0 0.0 0.0    Electrolytes Recent Labs  Lab 02/10/22 0229 02/11/22 0320 02/12/22 0422 02/13/22 0424 02/14/22 0634  NA 134* 132* 134* 135 134*  K 4.1 3.9 4.0 4.0 4.0  CL 103 103 101 107 108  CO2 21* 18* 18* 21* 19*  GLUCOSE 89 178* 97 101* 96  BUN 21 23 24* 17 18  CREATININE 1.27* 1.46* 1.40* 1.35* 1.33*  CALCIUM 8.0* 8.1* 8.4* 8.0* 7.8*  AST  '15 20 17 17 17  '$ ALT '19 19 19 19 19  '$ ALKPHOS 40 48 50 52 52  BILITOT 0.6 0.5 0.7 0.5 0.6  ALBUMIN 1.9* 1.8* 1.9* 1.9* 1.8*  MG 1.8 1.7 2.0 2.0 1.9  CRP 13.0* 13.4* 15.2* 18.4* 18.2*  PROCALCITON <0.10 <0.10 <0.10 <0.10 <0.10  BNP 209.2* 204.1* 260.5* 198.1* 216.6*    ------------------------------------------------------------------------------------------------------------------ No results for input(s): "CHOL", "HDL", "LDLCALC", "TRIG", "CHOLHDL", "LDLDIRECT" in the last 72 hours.  Lab Results  Component Value Date   HGBA1C 5.9 (H) 01/31/2022    No results for input(s): "TSH", "T4TOTAL", "T3FREE", "THYROIDAB" in the last 72 hours.  Invalid input(s): "FREET3"    Radiology Reports CT MAXILLOFACIAL WO CONTRAST  Result Date: 02/13/2022 CLINICAL DATA:  Poor dentition EXAM: CT MAXILLOFACIAL WITHOUT CONTRAST TECHNIQUE: Multidetector CT imaging of the maxillofacial structures was performed. Multiplanar CT image reconstructions were also generated. RADIATION DOSE REDUCTION: This exam was performed according to the departmental dose-optimization program  which includes automated exposure control, adjustment of the mA and/or kV according to patient size and/or use of iterative reconstruction technique. COMPARISON:  MRI head 02/17/2016 and CT paranasal sinuses 11/27/2007 FINDINGS: Osseous: Bilateral maxillary molar dental caries and pre molar dental carry on the left. Periapical lucency about the most posterior left maxillary molar extending into the left maxillary sinus. Maxillary and mandibular dental hardware. No acute fracture. Orbits: Postoperative changes both lenses.  No acute finding. Sinuses: Frothy secretions in the bilateral sphenoid and maxillary sinuses with air-fluid level in the right maxillary and sphenoid sinuses. Mild mucosal thickening in the ethmoid air cells. Right mastoid and middle ear effusions. Soft tissues: Negative. Limited intracranial: No acute abnormality. IMPRESSION:  Bilateral maxillary dental caries. Maxillary and sphenoid sinus sinusitis. Possibly odontogenic in origin. Right mastoid and middle ear effusions can be seen with mastoiditis/otitis media in the appropriate clinical setting. Electronically Signed   By: Placido Sou M.D.   On: 02/13/2022 22:42   DG Chest Port 1 View  Result Date: 02/11/2022 CLINICAL DATA:  Shortness of breath EXAM: PORTABLE CHEST 1 VIEW COMPARISON:  Yesterday FINDINGS: Cardiomegaly and aortic tortuosity accentuated by rotation. Indistinct opacity at the lung bases 1 non progressed. No visible effusion or pneumothorax. IMPRESSION: Stable pulmonary infiltrates. Electronically Signed   By: Jorje Guild M.D.   On: 02/11/2022 07:41   ECHOCARDIOGRAM LIMITED  Result Date: 02/10/2022    ECHOCARDIOGRAM LIMITED REPORT   Patient Name:   Allen Wheeler Date of Exam: 02/10/2022 Medical Rec #:  [id number]        Height:       66.0 in Accession #:    [id number]       Weight:       143.5 lb Date of Birth:  [birth year]       BSA:          1.737 m Patient Age:    89 years        BP:           126/106 mmHg Patient Gender: M                HR:           55 bpm. Exam Location:  Inpatient Procedure: Limited Echo, Color Doppler and Cardiac Doppler Indications:    Bacteremia  History:        Patient has prior history of Echocardiogram examinations.  Sonographer:    Johny Chess RDCS Referring Phys: [id number] [facility]  Sonographer Comments: Image acquisition challenging due to respiratory motion. IMPRESSIONS  1. Unable to determine if valvular vegetations present due to significant degenerative valvular changes with calcificaiton of both the aortic and mitral valves. If clinical suspicion for infective endocarditis, would consider empiric treatment.  2. Left ventricular ejection fraction, by estimation, is 50 to 55%. The left ventricle has low normal function.  3. The mitral valve is degenerative. Trivial mitral valve regurgitation. Severe mitral  annular calcification.  4. The aortic valve is tricuspid. There is moderate calcification of the aortic valve. There is moderate thickening of the aortic valve. Aortic valve regurgitation is trivial. There is aortic sclerosis/calcification without significant stenosis. Mean AoV gradient 17mHg with Vmax 249m. FINDINGS  Left Ventricle: Left ventricular ejection fraction, by estimation, is 50 to 55%. The left ventricle has low normal function. Pericardium: There is no evidence of pericardial effusion. Mitral Valve: The mitral valve is degenerative in appearance. Severe mitral annular calcification. Trivial mitral valve regurgitation. Tricuspid Valve: The tricuspid valve  is grossly normal. Tricuspid valve regurgitation is trivial. Aortic Valve: The aortic valve is tricuspid. There is moderate calcification of the aortic valve. There is moderate thickening of the aortic valve. Aortic valve regurgitation is trivial. Mild aortic stenosis is present. Aortic valve mean gradient measures 8.5 mmHg. Aortic valve peak gradient measures 16.3 mmHg. Pulmonic Valve: The pulmonic valve was grossly normal. Pulmonic valve regurgitation is trivial. Additional Comments: Spectral Doppler performed. Color Doppler performed.  AORTIC VALVE AV Vmax:           202.00 cm/s AV Vmean:          132.000 cm/s AV VTI:            0.416 m AV Peak Grad:      16.3 mmHg AV Mean Grad:      8.5 mmHg LVOT Vmax:         90.45 cm/s LVOT Vmean:        54.000 cm/s LVOT VTI:          0.195 m LVOT/AV VTI ratio: 0.47  SHUNTS Systemic VTI: 0.20 m Gwyndolyn Kaufman MD Electronically signed by Gwyndolyn Kaufman MD Signature Date/Time: 02/10/2022/1:29:44 PM    Final       Signature  Lala Lund M.D on 02/14/2022 at 10:28 AM   -  To page go to [email or web address]

## 2022-02-14 NOTE — Progress Notes (Signed)
Speech Language Pathology Treatment: Dysphagia  Patient Details Name: Allen Wheeler MRN: [id number] DOB: 03-Dec-1919 Today's Date: 02/14/2022 Time: 0277-4128 SLP Time Calculation (min) (ACUTE ONLY): 16 min  Assessment / Plan / Recommendation Clinical Impression  Pt was seen for dysphagia treatment. He was alert and cooperative during the session. Pt reported that he "swallows pretty good" and he denied any signs of aspiration with p.o. intake. Of note, cups with straws were noted in his room with the contents almost finished; straws were removed by SLP. Pt was educated regarding the results of the modified barium swallow study, diet recommendations, and swallowing precautions. Video recording of the study was used to facilitate education and pt verbalized understanding regarding all areas of education. Pt verbalized understanding regarding the silent aspiration noted during the MBS and the likelihood that his not coughing with thin liquids via straw should not be used as an indicator of his not aspirating. Pt tolerated regular texture solids and thin liquids via cup without overt s/s of aspiration and he was able to demonstrate use of swallowing precautions with an initial prompt only. Pt's current diet of regular texture solids and thin liquids will be continued with observance of swallowing precautions. SLP will continue to follow pt.     HPI HPI: Pt is a 88 year old male with a recent diagnosis of COVID-pneumonia, treated, discharged home then reutrned the following day due to increased lethargy. CXR 10/29: right-sided infiltrate is worsened in the interval. The  left-sided infiltrate is mildly worsened as well. Pt with acute hypoxic respiratory failure, sever sepsis with acute metabolic encephalopahthy due to PNA. PMH: GERD, depression/anxiety      SLP Plan  Continue with current plan of care      Recommendations for follow up therapy are one component of a multi-disciplinary discharge  planning process, led by the attending physician.  Recommendations may be updated based on patient status, additional functional criteria and insurance authorization.    Recommendations  Diet recommendations: Regular;Thin liquid Liquids provided via: Cup;No straw Medication Administration: Whole meds with puree Supervision: Patient able to self feed;Intermittent supervision to cue for compensatory strategies Compensations: Slow rate;Small sips/bites (avoid consecutive swallows) Postural Changes and/or Swallow Maneuvers: Seated upright 90 degrees                Oral Care Recommendations: Oral care BID Follow Up Recommendations: No SLP follow up Assistance recommended at discharge: PRN SLP Visit Diagnosis: Dysphagia, pharyngeal phase (R13.13) Plan: Continue with current plan of care         Allen Wheeler I. Allen Wheeler, [place], [place] Office number [phone]  Allen Wheeler  02/14/2022, 9:14 AM

## 2022-02-14 NOTE — Progress Notes (Signed)
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 Though to have post viral PNA and found to have MRSA bacteremia.  -Blood Cx on 10/25+ MRSA(10/22 Bcx NG-prior admission). He was started on vanc (6 days)and transitioned to dapto+ceftaroline.  -MRI L spine negative(pt had c/o back pain) -TTE on 10/26 showed calcified aortic valve with mild aortic valve thickening. Repeat TTE on 11/4 showed moderate calcified tricuspid aortic valve with  moderate thickening.  -CT max-facial showed dental carries, no abscess noted -Unfortunately, pt's blood Cx on 11/6 are positive. I spoke to his son Allen Wheeler) in regards to next steps. I think further IV antibiotics will be futile. Son plans to speak to social work in regards to discharge home  which I relayed will be without IV antibiotics.  Recommendations: -Continue Daptomycin and ceftaroline while inpatient, discharge on  linezolid x 2 weeks on discharge.  -Goals of care, Son speaking to case management. Noted referral to [facility] hospice.   ID will sign off, please contact us with any question or concerns Microbiology:   Antibiotics: Vancomycin 10/25-10/30 Dapto+ ceftaroline 10/31-p Cultures: Blood 10/22 NG 10/25 2/2 MRSA 10/27 MRSA 10/30 MRSA 11/1 MRSA 11/2 MRSA 11/4 MRSA 11/5 1/2 MRSA    SUBJECTIVE: Resting in bed, more tired today.  Interval:  Afebrile overnight.  Review of Systems: Review of Systems  All other systems reviewed and are negative.    Scheduled Meds:  acetaminophen  650 mg Oral BID   amLODipine  5 mg  Oral Daily   Chlorhexidine Gluconate Cloth  6 each Topical Daily   citalopram  10 mg Oral q AM   diclofenac Sodium  2 g Topical TID   gabapentin  200 mg Oral QHS   heparin  5,000 Units Subcutaneous Q8H   levothyroxine  50 mcg Oral QAC breakfast   nystatin   Topical TID   pantoprazole  40 mg Oral Daily   tamsulosin  0.8 mg Oral QPC supper   Continuous Infusions:  ceFTAROline (TEFLARO) IV 300 mg (02/14/22 1428)   DAPTOmycin (CUBICIN) 650 mg in sodium chloride 0.9 % IVPB 650 mg (02/13/22 2125)   PRN Meds:.acetaminophen, docusate sodium, guaiFENesin, ipratropium-albuterol, LORazepam, mouth rinse, polyethylene glycol, traMADol No Known Allergies  OBJECTIVE: Vitals:   02/13/22 2000 02/14/22 0058 02/14/22 0649 02/14/22 0812  BP: 128/71 (!) 103/53 114/60 121/60  Pulse: 70 64 (!) 59 (!) 57  Resp:  '16 16 17  '$ Temp: 99.5 F (37.5 C) 98.9 F (37.2 C) 97.8 F (36.6 C) (!) 97.5 F (36.4 C)  TempSrc: Oral   Oral  SpO2:  92% 93%   Weight:      Height:       Body mass index is 23.27 kg/m.  Physical Exam Constitutional:      General: He is not in acute distress.    Appearance: He is normal weight. He is not toxic-appearing.  HENT:     Head: Normocephalic and atraumatic.     Right Ear: External ear normal.     Left Ear: External ear  normal.     Nose: No congestion or rhinorrhea.     Mouth/Throat:     Mouth: Mucous membranes are moist.     Pharynx: Oropharynx is clear.  Eyes:     Extraocular Movements: Extraocular movements intact.     Conjunctiva/sclera: Conjunctivae normal.     Pupils: Pupils are equal, round, and reactive to light.  Cardiovascular:     Rate and Rhythm: Normal rate and regular rhythm.     Heart sounds: No murmur heard.    No friction rub. No gallop.  Pulmonary:     Effort: Pulmonary effort is normal.     Breath sounds: Normal breath sounds.  Abdominal:     General: Abdomen is flat. Bowel sounds are normal.     Palpations: Abdomen is soft.  Musculoskeletal:         General: No swelling. Normal range of motion.     Cervical back: Normal range of motion and neck supple.  Skin:    General: Skin is warm and dry.  Neurological:     General: No focal deficit present.     Mental Status: He is oriented to person, place, and time.  Psychiatric:        Mood and Affect: Mood normal.       Lab Results Lab Results  Component Value Date   WBC 8.6 02/14/2022   HGB 9.7 (L) 02/14/2022   HCT 29.3 (L) 02/14/2022   MCV 92.1 02/14/2022   PLT 153 02/14/2022    Lab Results  Component Value Date   CREATININE 1.33 (H) 02/14/2022   BUN 18 02/14/2022   NA 134 (L) 02/14/2022   K 4.0 02/14/2022   CL 108 02/14/2022   CO2 19 (L) 02/14/2022    Lab Results  Component Value Date   ALT 19 02/14/2022   AST 17 02/14/2022   ALKPHOS 52 02/14/2022   BILITOT 0.6 02/14/2022        Laurice Record, [facility] 02/14/2022, 3:31 PM

## 2022-02-14 NOTE — TOC Progression Note (Signed)
Transition of Care [facility]) - Progression Note    Patient Details  Name: QURAN VASCO MRN: [id number] Date of Birth: Mar 08, 1920  [facility]) CM/SW [place], LCSW Phone Number: 02/14/2022, 2:14 PM  Clinical Narrative:    1:30pm- CSW received request for comfort services instead of SNF rehab. CSW spoke with patient's son regarding transition to comfort care. He is interested in Hospice services at patient's home. CSW confirmed address on the Facesheet. He is agreeable for referral to [place]. He requested bedside commode and rollator. CSW sent referral to [facility].   1:45pm-Son requesting hospital bed and wheelchair instead of walker.   2pm-Son asked to consider facility hospice placement instead of home hospice. CSW made [facility] aware but patient does not have symptom management needs so would not qualify. CSW requested [facility] review patient for their facility criteria. Updated son.   Expected Discharge Plan: [place] Barriers to Discharge: Hospice Bed not available  Expected Discharge Plan and Services Expected Discharge Plan: [place] In-house Referral: Clinical Social Work   Post Acute Care Choice: Hospice Living arrangements for the past 2 months: Single Family Home                                       Social Determinants of Health (SDOH) Interventions    Readmission Risk Interventions     No data to display

## 2022-02-14 NOTE — Progress Notes (Signed)
Occupational Therapy Treatment Patient Details Name: JOURNEE KOHEN MRN: [id number] DOB: 1920-01-11 Today's Date: 02/14/2022   History of present illness Patient is 88 year old male with a recent diagnosis of COVID-pneumonia, treated, discharged home then reutrned the following day due to increased lethargy. Pt in junctional bradycardia, with intermittent runs of VT and  was hypoxic and placed on nonrebreather; Pt with acute hypoxic rspoiratory failure, sever sepsis wiht acute metabolic encephalopahthy due to PNA. Pt with low back pain and MRI on 11/1 showed L1 compression fx with 10% loss of height. 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   OT comments  Patient received in supine and agreeable to OT session. Patient was min assist to get to EOB with cues for log rolling. Patient required mod assist to donn corset brace seated on EOB. Patient was min assist to ambulate to sink for grooming and was able to perform with min guard assist with seated rest break following. Patient performed transfers to simulate toilet transfers and returned to supine due to patient states recliner is uncomfortable to his back. Acute OT to continue to follow.    Recommendations for follow up therapy are one component of a multi-disciplinary discharge planning process, led by the attending physician.  Recommendations may be updated based on patient status, additional functional criteria and insurance authorization.    Follow Up Recommendations  Home health OT    Assistance Recommended at Discharge Frequent or constant Supervision/Assistance  Patient can return home with the following  A little help with walking and/or transfers;A little help with bathing/dressing/bathroom;Assistance with cooking/housework;Direct supervision/assist for medications management;Direct supervision/assist for financial management;Assist for transportation;Help with stairs or ramp for  entrance   Equipment Recommendations  Tub/shower seat    Recommendations for Other Services      Precautions / Restrictions Precautions Precautions: Fall;Back Required Braces or Orthoses: Spinal Brace Spinal Brace: Applied in sitting position;Lumbar corset Restrictions Weight Bearing Restrictions: No       Mobility Bed Mobility Overal bed mobility: Needs Assistance Bed Mobility: Sidelying to Sit, Sit to Sidelying   Sidelying to sit: Min assist, HOB elevated     Sit to sidelying: Mod assist General bed mobility comments: assistance with BLEs to return to sidelying    Transfers Overall transfer level: Needs assistance Equipment used: Rolling walker (2 wheels) Transfers: Sit to/from Stand Sit to Stand: Min assist     Step pivot transfers: Min assist     General transfer comment: cues for hand placement and posture     Balance Overall balance assessment: Needs assistance Sitting-balance support: Feet supported Sitting balance-Leahy Scale: Good     Standing balance support: Single extremity supported, During functional activity Standing balance-Leahy Scale: Poor Standing balance comment: stood at sink for grooming tasks with one extremity support                           ADL either performed or assessed with clinical judgement   ADL Overall ADL's : Needs assistance/impaired     Grooming: Wash/dry hands;Wash/dry face;Oral care;Min guard;Standing Grooming Details (indicate cue type and reason): at sink                 Toilet Transfer: Minimal assistance Armed forces technical officer Details (indicate cue type and reason): simulated with RW           General ADL Comments: improved standing tolerance at sink    Extremity/Trunk Assessment  Vision       Perception     Praxis      Cognition Arousal/Alertness: Awake/alert Behavior During Therapy: WFL for tasks assessed/performed Overall Cognitive Status: Within Functional  Limits for tasks assessed                                          Exercises      Shoulder Instructions       General Comments      Pertinent Vitals/ Pain       Pain Assessment Pain Assessment: Faces Faces Pain Scale: Hurts little more Pain Location: back Pain Descriptors / Indicators: Grimacing, Guarding Pain Intervention(s): Limited activity within patient's tolerance, Monitored during session, Patient requesting pain meds-RN notified  Home Living                                          Prior Functioning/Environment              Frequency  Min 2X/week        Progress Toward Goals  OT Goals(current goals can now be found in the care plan section)  Progress towards OT goals: Progressing toward goals  Acute Rehab OT Goals Patient Stated Goal: get better OT Goal Formulation: With patient Time For Goal Achievement: 02/16/22 Potential to Achieve Goals: Good ADL Goals Pt Will Perform Grooming: with modified independence;standing Pt Will Perform Lower Body Bathing: with supervision;sit to/from stand Pt Will Perform Upper Body Dressing: with modified independence Pt Will Perform Lower Body Dressing: with set-up;with supervision;sit to/from stand Pt Will Transfer to Toilet: with supervision;ambulating Pt Will Perform Toileting - Clothing Manipulation and hygiene: with supervision;sitting/lateral leans Additional ADL Goal #1: Pt will demonstrate 3 energy conservation strategies during ADL tasks  Plan Discharge plan remains appropriate    Co-evaluation                 AM-PAC OT "6 Clicks" Daily Activity     Outcome Measure   Help from another person eating meals?: A Little Help from another person taking care of personal grooming?: A Little Help from another person toileting, which includes using toliet, bedpan, or urinal?: A Lot Help from another person bathing (including washing, rinsing, drying)?: A Lot Help from  another person to put on and taking off regular upper body clothing?: A Little Help from another person to put on and taking off regular lower body clothing?: A Lot 6 Click Score: 15    End of Session Equipment Utilized During Treatment: Gait belt;Rolling walker (2 wheels)  OT Visit Diagnosis: Unsteadiness on feet (R26.81);Muscle weakness (generalized) (M62.81);Other symptoms and signs involving cognitive function   Activity Tolerance Patient tolerated treatment well   Patient Left in bed;with call bell/phone within reach   Nurse Communication Mobility status        Time: 3295-1884 OT Time Calculation (min): 29 min  Charges: OT General Charges $OT Visit: 1 Visit OT Treatments $Self Care/Home Management : 23-37 mins  Lodema Hong, Rachel  Office [place] 02/14/2022, 9:51 AM

## 2022-02-15 DIAGNOSIS — B9562 Methicillin resistant Staphylococcus aureus infection as the cause of diseases classified elsewhere: Secondary | ICD-10-CM | POA: Diagnosis not present

## 2022-02-15 DIAGNOSIS — R7881 Bacteremia: Secondary | ICD-10-CM | POA: Diagnosis not present

## 2022-02-15 LAB — MISC LABCORP TEST (SEND OUT): Labcorp test code: 88005

## 2022-02-15 LAB — CBC WITH DIFFERENTIAL/PLATELET
Abs Immature Granulocytes: 0.06 10*3/uL (ref 0.00–0.07)
Basophils Absolute: 0 10*3/uL (ref 0.0–0.1)
Basophils Relative: 0 %
Eosinophils Absolute: 0 10*3/uL (ref 0.0–0.5)
Eosinophils Relative: 0 %
HCT: 30 % — ABNORMAL LOW (ref 39.0–52.0)
Hemoglobin: 10 g/dL — ABNORMAL LOW (ref 13.0–17.0)
Immature Granulocytes: 1 %
Lymphocytes Relative: 15 %
Lymphs Abs: 1.5 10*3/uL (ref 0.7–4.0)
MCH: 31 pg (ref 26.0–34.0)
MCHC: 33.3 g/dL (ref 30.0–36.0)
MCV: 92.9 fL (ref 80.0–100.0)
Monocytes Absolute: 0.8 10*3/uL (ref 0.1–1.0)
Monocytes Relative: 8 %
Neutro Abs: 7.7 10*3/uL (ref 1.7–7.7)
Neutrophils Relative %: 76 %
Platelets: 162 10*3/uL (ref 150–400)
RBC: 3.23 MIL/uL — ABNORMAL LOW (ref 4.22–5.81)
RDW: 14.2 % (ref 11.5–15.5)
WBC: 10.1 10*3/uL (ref 4.0–10.5)
nRBC: 0 % (ref 0.0–0.2)

## 2022-02-15 LAB — CULTURE, BLOOD (ROUTINE X 2): Special Requests: ADEQUATE

## 2022-02-15 LAB — COMPREHENSIVE METABOLIC PANEL
ALT: 20 U/L (ref 0–44)
AST: 19 U/L (ref 15–41)
Albumin: 1.9 g/dL — ABNORMAL LOW (ref 3.5–5.0)
Alkaline Phosphatase: 56 U/L (ref 38–126)
Anion gap: 9 (ref 5–15)
BUN: 19 mg/dL (ref 8–23)
CO2: 18 mmol/L — ABNORMAL LOW (ref 22–32)
Calcium: 7.9 mg/dL — ABNORMAL LOW (ref 8.9–10.3)
Chloride: 106 mmol/L (ref 98–111)
Creatinine, Ser: 1.41 mg/dL — ABNORMAL HIGH (ref 0.61–1.24)
GFR, Estimated: 44 mL/min — ABNORMAL LOW (ref >60)
Glucose, Bld: 97 mg/dL (ref 70–99)
Potassium: 3.8 mmol/L (ref 3.5–5.1)
Sodium: 133 mmol/L — ABNORMAL LOW (ref 135–145)
Total Bilirubin: 0.5 mg/dL (ref 0.3–1.2)
Total Protein: 5.8 g/dL — ABNORMAL LOW (ref 6.5–8.1)

## 2022-02-15 LAB — PROCALCITONIN: Procalcitonin: <0.1 ng/mL

## 2022-02-15 MED ORDER — PREDNISONE 5 MG PO TABS
10.0000 mg | ORAL_TABLET | Freq: Every day | ORAL | Status: DC
  Administered 2022-02-16: 10 mg via ORAL
  Filled 2022-02-15: qty 2

## 2022-02-15 NOTE — Progress Notes (Signed)
PT Cancellation Note  Patient Details Name: Allen Wheeler MRN: [id number] DOB: 1919/05/11   Cancelled Treatment:    Reason Eval/Treat Not Completed: Other (comment). Checked on pt x 2 this PM and sleeping comfortably. Did not disturb. Will continue to follow for any mobility needs. Per chart pt plans to go home with hospice.    Shary Decamp [facility] 02/15/2022, 3:10 PM [place] Office [phone]

## 2022-02-15 NOTE — Progress Notes (Signed)
Occupational Therapy Treatment Patient Details Name: Allen Wheeler MRN: [id number] DOB: December 28, 1919 Today's Date: 02/15/2022   History of present illness Patient is 88 year old male with a recent diagnosis of COVID-pneumonia, treated, discharged home then reutrned the following day due to increased lethargy. Pt in junctional bradycardia, with intermittent runs of VT and  was hypoxic and placed on nonrebreather; Pt with acute hypoxic rspoiratory failure, sever sepsis wiht acute metabolic encephalopahthy due to PNA. Pt with low back pain and MRI on 11/1 showed L1 compression fx with 10% loss of height. 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   OT comments  Patient appears to be off covid restrictions.  Patient agreeable to mobility in room, and able to complete toilet transfer and stand grooming.  Patient's primary focus is mobility, and PT and mobility specialist are following for that.  In discussions with son and patient, he will have the needed assist at home for ADL care, and will not need to worry about iADLs.  OT will defer mobility in the acute setting, and HH OT can be considered, if realistic goals of care are established.  Son mentioning the patient's time is limited, and they would like to get him home if possible.     Recommendations for follow up therapy are one component of a multi-disciplinary discharge planning process, led by the attending physician.  Recommendations may be updated based on patient status, additional functional criteria and insurance authorization.    Follow Up Recommendations  No OT follow up    Assistance Recommended at Discharge Frequent or constant Supervision/Assistance  Patient can return home with the following  A little help with walking and/or transfers;A little help with bathing/dressing/bathroom;Assistance with cooking/housework;Direct supervision/assist for medications management;Direct  supervision/assist for financial management;Assist for transportation;Help with stairs or ramp for entrance   Equipment Recommendations  Tub/shower seat;BSC/3in1    Recommendations for Other Services      Precautions / Restrictions Precautions Precautions: Fall;Back Precaution Comments: Monitor O2 Required Braces or Orthoses: Spinal Brace Spinal Brace: Lumbar corset;Applied in standing position Restrictions Weight Bearing Restrictions: No       Mobility Bed Mobility Overal bed mobility: Needs Assistance Bed Mobility: Sidelying to Sit   Sidelying to sit: Min assist, HOB elevated            Transfers Overall transfer level: Needs assistance Equipment used: Rolling walker (2 wheels) Transfers: Sit to/from Stand Sit to Stand: Min assist                 Balance Overall balance assessment: Needs assistance Sitting-balance support: Feet supported Sitting balance-Leahy Scale: Good     Standing balance support: Reliant on assistive device for balance Standing balance-Leahy Scale: Poor                             ADL either performed or assessed with clinical judgement   ADL       Grooming: Wash/dry hands;Wash/dry face;Min guard;Standing               Lower Body Dressing: Moderate assistance   Toilet Transfer: Minimal assistance;Rolling walker (2 wheels);Ambulation                  Extremity/Trunk Assessment Upper Extremity Assessment Upper Extremity Assessment: Overall WFL for tasks assessed   Lower Extremity Assessment Lower Extremity Assessment: Defer to PT evaluation   Cervical / Trunk Assessment Cervical / Trunk Assessment: Kyphotic  Vision Baseline Vision/History: 1 Wears glasses Patient Visual Report: No change from baseline     Perception Perception Perception: Not tested   Praxis Praxis Praxis: Not tested    Cognition Arousal/Alertness: Awake/alert Behavior During Therapy: WFL for tasks  assessed/performed Overall Cognitive Status: Within Functional Limits for tasks assessed                                          Exercises      Shoulder Instructions       General Comments  VSS on RA    Pertinent Vitals/ Pain       Pain Assessment Pain Assessment: Faces Faces Pain Scale: Hurts little more Pain Location: back Pain Descriptors / Indicators: Grimacing, Guarding Pain Intervention(s): Monitored during session                                                          Frequency           Progress Toward Goals  OT Goals(current goals can now be found in the care plan section)  Progress towards OT goals: Goals met/education completed, patient discharged from OT  Acute Rehab OT Goals OT Goal Formulation: With patient Time For Goal Achievement: 02/16/22 Potential to Achieve Goals: Good  Plan Discharge plan remains appropriate    Co-evaluation                 AM-PAC OT "6 Clicks" Daily Activity     Outcome Measure   Help from another person eating meals?: None Help from another person taking care of personal grooming?: A Little Help from another person toileting, which includes using toliet, bedpan, or urinal?: A Lot Help from another person bathing (including washing, rinsing, drying)?: A Lot Help from another person to put on and taking off regular upper body clothing?: A Little Help from another person to put on and taking off regular lower body clothing?: A Lot 6 Click Score: 16    End of Session Equipment Utilized During Treatment: Rolling walker (2 wheels);Back brace  OT Visit Diagnosis: Unsteadiness on feet (R26.81);Muscle weakness (generalized) (M62.81);Other symptoms and signs involving cognitive function   Activity Tolerance Patient tolerated treatment well   Patient Left in chair;with family/visitor present   Nurse Communication Mobility status        Time: 1131-1148 OT Time  Calculation (min): 17 min  Charges: OT General Charges $OT Visit: 1 Visit OT Treatments $Self Care/Home Management : 8-22 mins  02/15/2022  RP, OTR/L  Acute Rehabilitation Services  Office:  [phone]   Metta Clines 02/15/2022, 11:55 AM

## 2022-02-15 NOTE — Progress Notes (Signed)
PROGRESS NOTE                                                                                                                                                                                                             Patient Demographics:    Allen Wheeler, is a 88 y.o. male, DOB - [birth year], IHK:[id number]  Outpatient Primary MD for the patient is Allen Morale, MD    LOS - 15  Admit date - 01/31/2022    Chief Complaint  Patient presents with   Code Sepsis       Brief Narrative (HPI from H&P)   88 y.o. M with PMH of GERD, depression/anxiety and discharged home yesterday after being hospitalized with Covid PNA.   He recently visited [place] to celebrate his 89th birthday and became ill after traveling home.  He was admitted 10/22-10/23 and treated with Molnupavir.  After discharge home, family states he initially seemed to be doing well, however slept most of the day today and was increasingly lethargic and high fever, in the ER he was found to have bacterial right lower lobe pneumonia, further work-up showed MRSA bacteremia, in the ER he also had a run of V. tach.  He was admitted by PCCM, in ICU he had Foley catheter placement by urology, he was stabilized and transferred to my care on 02/04/2022 on day 4 of hospital stay.   Subjective:   Patient in bed denies any headache chest or abdominal pain, no shortness of breath, improving lower back pain.   Assessment  & Plan :   Severe sepsis, MRSA bacteremia after recent episode of COVID-pneumonia likely due to bacterial infection likely due to aspiration pneumonia caused at home. he rightly got weak and deconditioned due to recent COVID-19 infection and aspirated, developed bacterial pneumonia and now persistent MRSA bacteremia.  Has been seen by ID and PCCM, initially required BiPAP, now stable on 2 L nasal cannula oxygen, request speech to evaluate.  Antibiotics per ID  currently on combination of daptomycin and Teflaro, initial Echocardiogram does not show any evidence of endocarditis sepsis pathophysiology, due to persistent bacteremia echocardiogram was repeated on 02/10/2022 showing inconclusive aortic and mitral valve changes in the presence of advanced degeneration likely age-related, being treated empirically for endocarditis.  Also MRI L-spine was not suggestive of infection per neurosurgery.  Appears nontoxic  but does have persistent bacteremia.  Continue on antibiotics IV while here, then 2 weeks PO Zyvox upon DC with Home Hosp[facility], son and aptient in agreement that he is in terminal decline, DNR, goal of care now comfort.  Acute hypoxic respiratory failure due to pneumonia as above.  Much improved.  Initially required BiPAP now on nasal cannula, have speech evaluate, antibiotics for bacterial pneumonia, steroids for recent COVID infection.  Speech to evaluate to rule out ongoing aspiration.  Monitor closely.  Overall improving.  V.Tach in the ER.  Likely due to sepsis.  Resolved after amiodarone.  Avoiding beta-blocker or nodal agents due to underlying history of bradycardia and heart block.  Monitor electrolytes.  Junctional bradycardia with possible history of underlying second-degree AV block Mobitz type I.  Avoiding nodal agents.  Not a candidate for pacemaker due to bacteremia.  TSH is stable.  Continue to monitor on telemetry.  Blood pressure stable.  She had cardiology input discussed with Dr. Harrington Challenger on 02/06/2022.  No further work-up or intervention as suggested by cardiology.  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  Gentle oral Lasix x1 on 02/04/2022, placed on low-dose hydralazine and Imdur if blood pressure permits, avoiding ACE/ARB due to recent AKI, no beta-blocker due to bradycardia and possible underlying second-degree AV block Mobitz type I.  Foley catheter trauma in ICU causing hematuria, underlying history of BPH.   Foley catheter in ICU was pulled due to patient's delirium, urology was consulted they have placed a Foley catheter on 02/03/2022.  Continue Flomax likely will be discharged home with Foley catheter with outpatient urology follow-up.  CKD 3B.  Baseline creatinine around 1.4.  Monitor.  Hypothyroidism.  On Synthroid continue at home dose, TSH stable.  Hypertension.  Placed on low-dose Norvasc and monitor.  Dull low back pain.  Ongoing for 1 to 2 days.  Patient actually unsure about the duration.  MRI obtained in the setting of bacteremia, possible small L1 fracture with nonspecific surrounding edema, case discussed with Dr. Kathyrn Sheriff on 02/08/2022, he reviewed the MRI and agrees there is no infection, he agreed for supportive care for now, will add TLSO brace when getting out of the bed.  Added NSAID cream for supportive care.       Condition - Extremely Guarded  Family Communication  :    Son on Allen Wheeler [phone] 02/04/22, 02/06/2022, 02/07/2022 at 7:49 AM message left, called on 02/11/2022 8:14 AM and message left, updated 02/12/2022, called 02/14/2022 10:27 AM message left  Code Status :  No CPR or intubation, discussed with son on 02/04/2022, 02/06/22.  Consults  : PCCM, cardiology, urology, neurosurgeon Dr. Kathyrn Sheriff over the phone on 02/07/2022  PUD Prophylaxis :  PPI   Procedures  :     MRI - 1. Acute to subacute compression fracture involving the inferior aspect of L1 with mild 10% height loss without bony retropulsion. 2. Underlying multilevel degenerative disc disease and facet hypertrophy with resultant mild to moderate spinal stenosis at L2-3 through L4-5, with mild-to-moderate multilevel foraminal narrowing as above. 3. Mild edema within the right psoas muscle, favored to be secondary to the L1 compression fracture, with infection felt to be unlikely. No other evidence for acute infection elsewhere within the lumbar spine. No collections. 4. 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 Recommend follow-up every 2 years. This recommendation follows ACR consensus guidelines: White Paper of the ACR Incidental Findings Committee II on Vascular Findings. J Am Coll  Radiol 2013; 10:789-794. 5. Mild distension of the urinary bladder with a Foley catheter in place. Bedside check to ensure adequate placement and functioning suggested  TTE 02/01/22 - 1. Left ventricular ejection fraction, by estimation, is 40 to 45%. The left ventricle has mildly decreased function. The left ventricle demonstrates global hypokinesis. There is moderate asymmetric left ventricular hypertrophy of the septal segment. Left ventricular diastolic parameters are consistent with Grade I diastolic dysfunction (impaired relaxation).  2. Right ventricular systolic function is normal. The right ventricular size is normal.  3. Left atrial size was mildly dilated.  4. The mitral valve is normal in structure. Mild mitral valve regurgitation. No evidence of mitral stenosis.  5. The aortic valve is calcified. There is moderate calcification of the aortic valve. There is mild thickening of the aortic valve. Aortic valve regurgitation is not visualized. Aortic valve sclerosis/calcification is present, without any evidence of aortic stenosis. Aortic valve area, by VTI measures 2.00 cm. Aortic valve mean gradient measures 8.0 mmHg. Aortic valve Vmax measures 1.96 m/s.  6. The inferior vena cava is normal in size with greater than 50% respiratory variability, suggesting right atrial pressure of 3 mmHg.  Repeat TTE 02/11/22 -  1. Unable to determine if valvular vegetations present due to significant degenerative valvular changes with calcificaiton of both the aortic and mitral valves. If clinical suspicion for infective endocarditis, would consider empiric treatment.  2. Left ventricular ejection fraction, by estimation, is 50 to 55%. The left ventricle has low normal function.  3. The mitral valve is degenerative. Trivial  mitral valve regurgitation. Severe mitral annular calcification.  4. The aortic valve is tricuspid. There is moderate calcification of the aortic valve. There is moderate thickening of the aortic valve. Aortic valve regurgitation is trivial. There is aortic sclerosis/calcification without significant stenosis. Mean AoV gradient 52mHg with Vmax 247m      Disposition Plan  :  Status is: Inpatient  DVT Prophylaxis  :    heparin injection 5,000 Units Start: 01/31/22 2200 SCDs Start: 01/31/22 2105   Lab Results  Component Value Date   PLT 162 02/15/2022    Diet :  Diet Order             Diet regular Room service appropriate? Yes with Assist; Fluid consistency: Thin  Diet effective now                    Inpatient Medications  Scheduled Meds:  acetaminophen  650 mg Oral BID   amLODipine  5 mg Oral Daily   Chlorhexidine Gluconate Cloth  6 each Topical Daily   citalopram  10 mg Oral q AM   diclofenac Sodium  2 g Topical TID   gabapentin  200 mg Oral QHS   heparin  5,000 Units Subcutaneous Q8H   levothyroxine  50 mcg Oral QAC breakfast   nystatin   Topical TID   pantoprazole  40 mg Oral Daily   [START ON 02/16/2022] predniSONE  10 mg Oral Q breakfast   tamsulosin  0.8 mg Oral QPC supper   Continuous Infusions:  ceFTAROline (TEFLARO) IV 300 mg (02/15/22 0540)   DAPTOmycin (CUBICIN) 650 mg in sodium chloride 0.9 % IVPB Stopped (02/13/22 2153)   PRN Meds:.acetaminophen, docusate sodium, guaiFENesin, ipratropium-albuterol, LORazepam, mouth rinse, polyethylene glycol, traMADol    Objective:   Vitals:   02/14/22 1941 02/14/22 2318 02/15/22 0307 02/15/22 1009  BP: (!) 125/56 (!) 116/49 129/62 (!) 112/53  Pulse: 68 66 64 62  Resp:  $'17 18 17 19  'J$ Temp: 98.6 F (37 C) 98.5 F (36.9 C) 98.4 F (36.9 C)   TempSrc: Axillary Oral Oral   SpO2:      Weight:      Height:        Wt Readings from Last 3 Encounters:  02/11/22 65.4 kg  05/15/21 70.4 kg  05/12/21 72.1 kg      Intake/Output Summary (Last 24 hours) at 02/15/2022 1025 Last data filed at 02/15/2022 0300 Gross per 24 hour  Intake 950.85 ml  Output 502 ml  Net 448.85 ml     Physical Exam  Awake Alert, No new F.N deficits, Normal affect [place].AT,PERRAL Supple Neck, No JVD,   Symmetrical Chest wall movement, Good air movement bilaterally, CTAB RRR,No Gallops, Rubs or new Murmurs,  +ve B.Sounds, Abd Soft, No tenderness,   No Cyanosis, Clubbing or edema   RN pressure injury documentation: Pressure Injury 02/01/22 Buttocks Right;Left Stage 1 -  Intact skin with non-blanchable redness of a localized area usually over a bony prominence. (Active)  02/01/22 0055  Location: Buttocks  Location Orientation: Right;Left  Staging: Stage 1 -  Intact skin with non-blanchable redness of a localized area usually over a bony prominence.  Wound Description (Comments):   Present on Admission:   Dressing Type None 02/14/22 0830      Data Review:    CBC Recent Labs  Lab 02/11/22 0320 02/12/22 0422 02/13/22 0424 02/14/22 0634 02/15/22 0430  WBC 8.8 10.3 8.6 8.6 10.1  HGB 11.0* 11.2* 10.9* 9.7* 10.0*  HCT 32.3* 32.6* 32.1* 29.3* 30.0*  PLT 145* 162 160 153 162  MCV 91.8 91.1 91.2 92.1 92.9  MCH 31.3 31.3 31.0 30.5 31.0  MCHC 34.1 34.4 34.0 33.1 33.3  RDW 14.2 14.0 14.1 14.1 14.2  LYMPHSABS 1.2 1.3 1.2 1.2 1.5  MONOABS 0.4 0.6 0.7 0.8 0.8  EOSABS 0.0 0.0 0.0 0.0 0.0  BASOSABS 0.0 0.0 0.0 0.0 0.0    Electrolytes Recent Labs  Lab 02/10/22 0229 02/11/22 0320 02/12/22 0422 02/13/22 0424 02/14/22 0634 02/15/22 0430  NA 134* 132* 134* 135 134* 133*  K 4.1 3.9 4.0 4.0 4.0 3.8  CL 103 103 101 107 108 106  CO2 21* 18* 18* 21* 19* 18*  GLUCOSE 89 178* 97 101* 96 97  BUN 21 23 24* '17 18 19  '$ CREATININE 1.27* 1.46* 1.40* 1.35* 1.33* 1.41*  CALCIUM 8.0* 8.1* 8.4* 8.0* 7.8* 7.9*  AST '15 20 17 17 17 19  '$ ALT '19 19 19 19 19 20  '$ ALKPHOS 40 48 50 52 52 56  BILITOT 0.6 0.5 0.7 0.5 0.6 0.5   ALBUMIN 1.9* 1.8* 1.9* 1.9* 1.8* 1.9*  MG 1.8 1.7 2.0 2.0 1.9  --   CRP 13.0* 13.4* 15.2* 18.4* 18.2*  --   PROCALCITON <0.10 <0.10 <0.10 <0.10 <0.10 <0.10  BNP 209.2* 204.1* 260.5* 198.1* 216.6*  --     ------------------------------------------------------------------------------------------------------------------ No results for input(s): "CHOL", "HDL", "LDLCALC", "TRIG", "CHOLHDL", "LDLDIRECT" in the last 72 hours.  Lab Results  Component Value Date   HGBA1C 5.9 (H) 01/31/2022    No results for input(s): "TSH", "T4TOTAL", "T3FREE", "THYROIDAB" in the last 72 hours.  Invalid input(s): "FREET3"    Radiology Reports CT MAXILLOFACIAL WO CONTRAST  Result Date: 02/13/2022 CLINICAL DATA:  Poor dentition EXAM: CT MAXILLOFACIAL WITHOUT CONTRAST TECHNIQUE: Multidetector CT imaging of the maxillofacial structures was performed. Multiplanar CT image reconstructions were also generated. RADIATION DOSE REDUCTION: This exam was performed according to the  departmental dose-optimization program which includes automated exposure control, adjustment of the mA and/or kV according to patient size and/or use of iterative reconstruction technique. COMPARISON:  MRI head 02/17/2016 and CT paranasal sinuses 11/27/2007 FINDINGS: Osseous: Bilateral maxillary molar dental caries and pre molar dental carry on the left. Periapical lucency about the most posterior left maxillary molar extending into the left maxillary sinus. Maxillary and mandibular dental hardware. No acute fracture. Orbits: Postoperative changes both lenses.  No acute finding. Sinuses: Frothy secretions in the bilateral sphenoid and maxillary sinuses with air-fluid level in the right maxillary and sphenoid sinuses. Mild mucosal thickening in the ethmoid air cells. Right mastoid and middle ear effusions. Soft tissues: Negative. Limited intracranial: No acute abnormality. IMPRESSION: Bilateral maxillary dental caries. Maxillary and sphenoid sinus  sinusitis. Possibly odontogenic in origin. Right mastoid and middle ear effusions can be seen with mastoiditis/otitis media in the appropriate clinical setting. Electronically Signed   By: Placido Sou M.D.   On: 02/13/2022 22:42      Signature  Lala Lund M.D on 02/15/2022 at 10:25 AM   -  To page go to [email or web address]

## 2022-02-15 NOTE — Discharge Instructions (Addendum)
Disposition.  Residential hospice Condition.  Guarded CODE STATUS.  DNR Activity.  With assistance as tolerated, full fall precautions. Diet.  Soft with feeding assistance and aspiration precautions. Goal of care.  Comfort. Wear the TLSO back brace provided when you are getting out of bed.

## 2022-02-16 ENCOUNTER — Telehealth: Payer: Self-pay | Admitting: Family Medicine

## 2022-02-16 ENCOUNTER — Other Ambulatory Visit (HOSPITAL_COMMUNITY): Payer: Self-pay

## 2022-02-16 DIAGNOSIS — R7881 Bacteremia: Secondary | ICD-10-CM | POA: Diagnosis not present

## 2022-02-16 DIAGNOSIS — B9562 Methicillin resistant Staphylococcus aureus infection as the cause of diseases classified elsewhere: Secondary | ICD-10-CM | POA: Diagnosis not present

## 2022-02-16 MED ORDER — DOCUSATE SODIUM 100 MG PO CAPS
100.0000 mg | ORAL_CAPSULE | Freq: Two times a day (BID) | ORAL | 0 refills | Status: DC | PRN
  Filled 2022-02-16: qty 10, 5d supply, fill #0

## 2022-02-16 MED ORDER — HYDROCODONE-ACETAMINOPHEN 5-325 MG PO TABS
1.0000 | ORAL_TABLET | Freq: Three times a day (TID) | ORAL | 0 refills | Status: DC | PRN
  Filled 2022-02-16: qty 15, 5d supply, fill #0

## 2022-02-16 MED ORDER — DICLOFENAC SODIUM 1 % EX GEL
2.0000 g | Freq: Three times a day (TID) | CUTANEOUS | 0 refills | Status: AC | PRN
  Filled 2022-02-16: qty 100, 17d supply, fill #0

## 2022-02-16 MED ORDER — AMLODIPINE BESYLATE 5 MG PO TABS
5.0000 mg | ORAL_TABLET | Freq: Every day | ORAL | 0 refills | Status: AC
  Filled 2022-02-16: qty 30, 30d supply, fill #0

## 2022-02-16 MED ORDER — LINEZOLID 600 MG PO TABS
600.0000 mg | ORAL_TABLET | Freq: Two times a day (BID) | ORAL | 0 refills | Status: AC
  Filled 2022-02-16: qty 60, 30d supply, fill #0

## 2022-02-16 NOTE — TOC Progression Note (Signed)
Transition of Care [facility]) - Progression Note    Patient Details  Name: Allen Wheeler MRN: [id number] Date of Birth: 1919-08-11  Transition of Care [facility]) CM/SW [place], LCSW Phone Number: 02/16/2022, 9:44 AM  Clinical Narrative:    Per patient's son, hospice DME has been delivered to his home. Hospice hoping to have patient arrive home by 1pm so their nurse can come assess him at 2pm.    Expected Discharge Plan: [place] Barriers to Discharge: Hospice Bed not available  Expected Discharge Plan and Services Expected Discharge Plan: [place] In-house Referral: Clinical Social Work   Post Acute Care Choice: Hospice Living arrangements for the past 2 months: Single Family Home                                       Social Determinants of Health (SDOH) Interventions    Readmission Risk Interventions     No data to display

## 2022-02-16 NOTE — Telephone Encounter (Signed)
Please okay this order  ?

## 2022-02-16 NOTE — Care Management (Addendum)
[facility] called they will arrive in 30 minutes updated RN. Family here at bedside, informed them. They stated they were supposed to meet with Rep  in the room.  1130 Just received a call from hospice Cherie, stating that because he is on antibiotics, he does not qualify for hospice, she will call the son  to see if he wants to go with care connections, or hospice- comfort care and call  this RNCM back [place] called back and stated they were going to  go with palliative care - care connection. All equipment ordered and [facility] called to change from hospice for billing to patient insurance. Spoke to [place] at [facility], hospice will have to change this over.

## 2022-02-16 NOTE — Progress Notes (Signed)
SLP Cancellation Note  Patient Details Name: Allen Wheeler MRN: [id number] DOB: 03-16-1920   Cancelled treatment:       Reason Eval/Treat Not Completed: Other (comment) (Per EMR, GOC are currently to d/c home with hospice care. SLP will sign off.)  Tor Tsuda I. Hardin Negus, [place], [place] Office number [phone]   Horton Marshall 02/16/2022, 9:59 AM

## 2022-02-16 NOTE — Telephone Encounter (Signed)
Allen Wheeler call from [place] she stated she need a verbal order to start Palliative program .

## 2022-02-16 NOTE — Care Management (Signed)
    Durable Medical Equipment  (From admission, onward)           Start     Ordered   02/16/22 1143  For home use only DME Walker rolling  Once       Question Answer Comment  Walker: With [employer] Wheels   Patient needs a walker to treat with the following condition Weakness      02/16/22 1143   02/16/22 1143  For home use only DME Bedside commode  Once       Question:  Patient needs a bedside commode to treat with the following condition  Answer:  Weakness   02/16/22 1143   02/16/22 1142  For home use only DME lightweight manual wheelchair with seat cushion  Once       Comments: Patient suffers from sepsis which impairs their ability to perform daily activities like toileting in the home.  A walker will not resolve  issue with performing activities of daily living. A wheelchair will allow patient to safely perform daily activities. Patient is not able to propel themselves in the home using a standard weight wheelchair due to general weakness. Patient can self propel in the lightweight wheelchair. Length of need Lifetime. Accessories: elevating leg rests (ELRs), wheel locks, extensions and anti-tippers.   02/16/22 1143   02/16/22 1141  For home use only DME Hospital bed  Once       Question Answer Comment  Length of Need Lifetime   Patient has (list medical condition): Sepsis   The above medical condition requires: Patient requires the ability to reposition frequently   Head must be elevated greater than: 30 degrees   Bed type Semi-electric   [employer] Yes   Trapeze Bar Yes   Support Surface: Gel Overlay      02/16/22 1143

## 2022-02-16 NOTE — Progress Notes (Signed)
   The pt was originally referred to our services for hospice services. However today the pt's son Allen Wheeler feels that they want to take him home still and pursue oral antibiotics Zyvox for 4 weeks. Discussion with son about this would be considered a curative measure by Medicare for hi bacteremia and endocarditis therefore he would not qualify for hospice care with this decision. He was offered our Care Connection program and explained that he would be able to have the antibiotic therapy along with nursing and SW visits to assist with sx management needs related to the endocarditis and bacteremia. He has now chosen to go home with Palliative care services at home and see if the antibiotics will help with the infection.  The son has also obtained additional support in home with [place] as well for ADL assistance.   We will reach out to the PCP and get orders for this services and plan to see pt in home Monday 13th at [place] [phone]

## 2022-02-16 NOTE — Telephone Encounter (Signed)
Spoke with Manus Gunning with [facility], advised that Dr Sarajane Jews approved verbal orders

## 2022-02-16 NOTE — Discharge Summary (Addendum)
MONTREZ MARIETTA ZSW:[id number] DOB: 1919/08/21 DOA: 01/31/2022  PCP: [name], MD  Admit date: 01/31/2022  Discharge date: 02/16/2022  Admitted From: Home   Disposition:  Home with Hospice   Recommendations for Outpatient Follow-up:   Follow up with PCP in 1-2 weeks  PCP Please obtain BMP/CBC, 2 view CXR in 1week,  (see Discharge instructions)   PCP Please follow up on the following pending results: Needs outpatient follow-up with urology, if he does well repeat blood cultures in 2 to 3 weeks, if MRSA bacteremia has cleared then needs outpatient follow-up with infectious disease before stoppage of Zyvox.   Home Health: Hospice   Equipment/Devices: as below  Consultations: ID, Pall. Care Discharge Condition: Guarded CODE STATUS: DNR   Diet Recommendation: Soft  Chief Complaint  Patient presents with   Code Sepsis     Brief history of present illness from the day of admission and additional interim summary    88 y.o. M with PMH of GERD, depression/anxiety and discharged home yesterday after being hospitalized with Covid PNA.   He recently visited [place] to celebrate his 89th birthday and became ill after traveling home.  He was admitted 10/22-10/23 and treated with Molnupavir.  After discharge home, family states he initially seemed to be doing well, however slept most of the day today and was increasingly lethargic and high fever, in the ER he was found to have bacterial right lower lobe pneumonia, further work-up showed MRSA bacteremia, in the ER he also had a run of V. tach.  He was admitted by PCCM, in ICU he had Foley catheter placement by urology, he was stabilized and transferred to my care on 02/04/2022 on day 4 of hospital stay.                                                                   Hospital Course    Severe sepsis, MRSA bacteremia after recent episode of COVID-pneumonia likely due to bacterial infection likely due to aspiration pneumonia caused at home. he is treated for COVID-19 pneumonia initially required BiPAP however this problem has resolved.   Unfortunately he subsequently developed secondary pneumonia with persistent MRSA bacteremia, he was seen by ID and has received weeks of IV antibiotics combination without clearance of MRSA bacteremia, there is high suspicion that he might have bacterial endocarditis and possibly some discitis, echocardiogram transthoracic was poor quality, due to his age he is not a candidate for TEE or invasive cardiac procedures or interventions.  ID had detailed discussions with family, palliative care also was involved, at this time it was decided that he will be best served with home hospice.    Since he is functional at his baseline he son prefers for him to go home with 4 weeks of Zyvox and  then reevaluate the situation, most likely he is going to decline rapidly.  If however he does well then we will request PCP to repeat blood cultures and 2 weeks time.  If cultures have cleared then please refer him to ID as he may require further prolongation of Zyvox course.    Acute hypoxic respiratory failure due to pneumonia as above.  Much improved.  Initially required BiPAP now on nasal cannula, have speech evaluate, antibiotics for bacterial pneumonia, steroids for recent COVID infection.  Speech to evaluate to rule out ongoing aspiration.  Monitor closely.  Overall improving.   V.Tach in the ER.  Likely due to sepsis.  Resolved after amiodarone.  Avoiding beta-blocker or nodal agents due to underlying history of bradycardia and heart block.  Monitor electrolytes.   Junctional bradycardia with possible history of underlying second-degree AV block Mobitz type I.  Avoiding nodal agents.  Not a candidate for pacemaker due to bacteremia.  TSH is  stable.  Continue to monitor on telemetry.  Blood pressure stable.  She had cardiology input discussed with Dr. Harrington Challenger on 02/06/2022.  No further work-up or intervention as suggested by cardiology.   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  Gentle oral Lasix x1 on 02/04/2022, placed on low-dose hydralazine and Imdur if blood pressure permits, avoiding ACE/ARB due to recent AKI, no beta-blocker due to bradycardia and possible underlying second-degree AV block Mobitz type I.   Foley catheter trauma in ICU causing hematuria, underlying history of BPH.  Foley catheter in ICU was pulled due to patient's delirium, urology was consulted they have placed a Foley catheter on 02/03/2022.  Continue Flomax likely will be discharged home with Foley catheter with outpatient urology follow-up.   CKD 3B.  Baseline creatinine around 1.4.  Monitor.   Hypothyroidism.  On Synthroid continue at home dose, TSH stable.   Hypertension.  Placed on low-dose Norvasc and monitor.   Dull low back pain.  Ongoing for 1 to 2 days.  Patient actually unsure about the duration.  MRI obtained in the setting of bacteremia, possible small L1 fracture with nonspecific surrounding edema, case discussed with Dr. Kathyrn Sheriff on 02/08/2022, he reviewed the MRI and agrees there is no infection, he agreed for supportive care for now, will add TLSO brace when getting out of the bed.  Added NSAID cream for supportive care.   Discharge diagnosis     Principal Problem:   MRSA bacteremia Active Problems:   Pneumonia due to COVID-19 virus   Respiratory failure ([place])   Pressure injury of skin   Hypoxia    Discharge instructions    Discharge Instructions     Discharge instructions   Complete by: As directed    Disposition.  Residential hospice Condition.  Guarded CODE STATUS.  DNR Activity.  With assistance as tolerated, full fall precautions. Diet.  Soft with feeding assistance and aspiration  precautions. Goal of care.  Comfort. Wear the TLSO back brace provided when you are getting out of bed.   Increase activity slowly   Complete by: As directed    No wound care   Complete by: As directed        Discharge Medications   Allergies as of 02/16/2022   No Known Allergies      Medication List     STOP taking these medications    molnupiravir EUA 200 mg Caps capsule Commonly known as: LAGEVRIO       TAKE these medications  amLODipine 5 MG tablet Commonly known as: NORVASC Take 1 tablet (5 mg total) by mouth daily. Start taking on: February 17, 2022   Centrum Silver Adult 50+ Tabs Take 1 tablet by mouth in the morning.   citalopram 10 MG tablet Commonly known as: CELEXA TAKE ONE TABLET ONCE DAILY What changed: when to take this   diclofenac Sodium 1 % Gel Commonly known as: VOLTAREN Apply 2 g topically 3 (three) times daily. Apply to lower back 3 times a day as needed   diphenoxylate-atropine 2.5-0.025 MG tablet Commonly known as: LOMOTIL TAKE ONE OR TWO TABLETS BY MOUTH EVERY MORNING What changed:  how much to take how to take this when to take this reasons to take this additional instructions   docusate sodium 100 MG capsule Commonly known as: COLACE Take 1 capsule (100 mg total) by mouth 2 (two) times daily as needed for mild constipation.   gabapentin 100 MG capsule Commonly known as: NEURONTIN Take 2 capsules (200 mg total) by mouth at bedtime.   HYDROcodone-acetaminophen 5-325 MG tablet Commonly known as: NORCO/VICODIN Take 1 tablet by mouth every 8 (eight) hours as needed for severe pain.   hydrOXYzine 25 MG capsule Commonly known as: VISTARIL TAKE ONE CAPSULE BY MOUTH EVERY 6 HOURS AS NEEDED FOR ITCHING What changed: See the new instructions.   levothyroxine 50 MCG tablet Commonly known as: SYNTHROID TAKE 1 TABLET ONCE DAILY. What changed: when to take this   linezolid 600 MG tablet Commonly known as: Zyvox Take 1  tablet (600 mg total) by mouth 2 (two) times daily.   LORazepam 1 MG tablet Commonly known as: ATIVAN Take 1 tablet (1 mg total) by mouth at bedtime as needed. What changed: reasons to take this   nystatin powder Commonly known as: MYCOSTATIN/NYSTOP Apply topically 3 (three) times daily.   omeprazole 40 MG capsule Commonly known as: PRILOSEC TAKE 1 CAPSULE DAILY. What changed: when to take this   predniSONE 10 MG tablet Commonly known as: DELTASONE TAKE 1 TABLET ONCE DAILY. What changed: when to take this   tamsulosin 0.4 MG Caps capsule Commonly known as: FLOMAX TAKE TWO CAPSULES BY MOUTH DAILY What changed: when to take this         Contact information for follow-up providers     [name], MD. Schedule an appointment as soon as possible for a visit in 1 week(s).   Specialty: Family Medicine Contact information: [place] [phone]              Contact information for after-discharge care     Destination     [facility] .   Service: Skilled Nursing Contact information: [address] [place] [phone]                     Major procedures and Radiology Reports - PLEASE review detailed and final reports thoroughly  -      CT MAXILLOFACIAL WO CONTRAST  Result Date: 02/13/2022 CLINICAL DATA:  Poor dentition EXAM: CT MAXILLOFACIAL WITHOUT CONTRAST TECHNIQUE: Multidetector CT imaging of the maxillofacial structures was performed. Multiplanar CT image reconstructions were also generated. RADIATION DOSE REDUCTION: This exam was performed according to the departmental dose-optimization program which includes automated exposure control, adjustment of the mA and/or kV according to patient size and/or use of iterative reconstruction technique. COMPARISON:  MRI head 02/17/2016 and CT paranasal sinuses 11/27/2007 FINDINGS: Osseous: Bilateral  maxillary  molar dental caries and pre molar dental carry on the left. Periapical lucency about the most posterior left maxillary molar extending into the left maxillary sinus. Maxillary and mandibular dental hardware. No acute fracture. Orbits: Postoperative changes both lenses.  No acute finding. Sinuses: Frothy secretions in the bilateral sphenoid and maxillary sinuses with air-fluid level in the right maxillary and sphenoid sinuses. Mild mucosal thickening in the ethmoid air cells. Right mastoid and middle ear effusions. Soft tissues: Negative. Limited intracranial: No acute abnormality. IMPRESSION: Bilateral maxillary dental caries. Maxillary and sphenoid sinus sinusitis. Possibly odontogenic in origin. Right mastoid and middle ear effusions can be seen with mastoiditis/otitis media in the appropriate clinical setting. Electronically Signed   By: Placido Sou M.D.   On: 02/13/2022 22:42   DG Chest Port 1 View  Result Date: 02/11/2022 CLINICAL DATA:  Shortness of breath EXAM: PORTABLE CHEST 1 VIEW COMPARISON:  Yesterday FINDINGS: Cardiomegaly and aortic tortuosity accentuated by rotation. Indistinct opacity at the lung bases 1 non progressed. No visible effusion or pneumothorax. IMPRESSION: Stable pulmonary infiltrates. Electronically Signed   By: Jorje Guild M.D.   On: 02/11/2022 07:41   ECHOCARDIOGRAM LIMITED  Result Date: 02/10/2022    ECHOCARDIOGRAM LIMITED REPORT   Patient Name:   JAVEN HINDERLITER Date of Exam: 02/10/2022 Medical Rec #:  [id number]        Height:       66.0 in Accession #:    [id number]       Weight:       143.5 lb Date of Birth:  [birth year]       BSA:          1.737 m Patient Age:    88 years        BP:           126/106 mmHg Patient Gender: M                HR:           55 bpm. Exam Location:  Inpatient Procedure: Limited Echo, Color Doppler and Cardiac Doppler Indications:    Bacteremia  History:        Patient has prior history of Echocardiogram examinations.   Sonographer:    Johny Chess RDCS Referring Phys: [id number] [facility]  Sonographer Comments: Image acquisition challenging due to respiratory motion. IMPRESSIONS  1. Unable to determine if valvular vegetations present due to significant degenerative valvular changes with calcificaiton of both the aortic and mitral valves. If clinical suspicion for infective endocarditis, would consider empiric treatment.  2. Left ventricular ejection fraction, by estimation, is 50 to 55%. The left ventricle has low normal function.  3. The mitral valve is degenerative. Trivial mitral valve regurgitation. Severe mitral annular calcification.  4. The aortic valve is tricuspid. There is moderate calcification of the aortic valve. There is moderate thickening of the aortic valve. Aortic valve regurgitation is trivial. There is aortic sclerosis/calcification without significant stenosis. Mean AoV gradient 52mHg with Vmax 236m. FINDINGS  Left Ventricle: Left ventricular ejection fraction, by estimation, is 50 to 55%. The left ventricle has low normal function. Pericardium: There is no evidence of pericardial effusion. Mitral Valve: The mitral valve is degenerative in appearance. Severe mitral annular calcification. Trivial mitral valve regurgitation. Tricuspid Valve: The tricuspid valve is grossly normal. Tricuspid valve regurgitation is trivial. Aortic Valve: The aortic valve is tricuspid. There is moderate calcification of the aortic valve. There is moderate thickening of the aortic valve. Aortic valve regurgitation is trivial.  Mild aortic stenosis is present. Aortic valve mean gradient measures 8.5 mmHg. Aortic valve peak gradient measures 16.3 mmHg. Pulmonic Valve: The pulmonic valve was grossly normal. Pulmonic valve regurgitation is trivial. Additional Comments: Spectral Doppler performed. Color Doppler performed.  AORTIC VALVE AV Vmax:           202.00 cm/s AV Vmean:          132.000 cm/s AV VTI:            0.416 m AV Peak  Grad:      16.3 mmHg AV Mean Grad:      8.5 mmHg LVOT Vmax:         90.45 cm/s LVOT Vmean:        54.000 cm/s LVOT VTI:          0.195 m LVOT/AV VTI ratio: 0.47  SHUNTS Systemic VTI: 0.20 m Gwyndolyn Kaufman MD Electronically signed by Gwyndolyn Kaufman MD Signature Date/Time: 02/10/2022/1:29:44 PM    Final    DG Chest Port 1 View  Result Date: 02/10/2022 CLINICAL DATA:  Shortness of breath EXAM: PORTABLE CHEST 1 VIEW COMPARISON:  02/04/2022 FINDINGS: Normal heart size. Mediastinal widening primarily from aortic tortuosity, accentuated by scoliosis. Infiltrates at the lung bases are unchanged. No visible effusion or pneumothorax. IMPRESSION: Unchanged bilateral pneumonia. Electronically Signed   By: Jorje Guild M.D.   On: 02/10/2022 08:32   DG Swallowing Func-Speech Pathology  Result Date: 02/08/2022 Table formatting from the original result was not included. Objective Swallowing Evaluation: Type of Study: MBS-Modified Barium Swallow Study  Patient Details Name: KENDARIUS VIGEN MRN: [id number] Date of Birth: 1919/07/20 Today's Date: 02/08/2022 Time: SLP Start Time (ACUTE ONLY): 1401 -SLP Stop Time (ACUTE ONLY): 1425 SLP Time Calculation (min) (ACUTE ONLY): 24 min Past Medical History: Past Medical History: Diagnosis Date  Allergy   Anxiety   Chickenpox   Depression   Dizziness, nonspecific   loss of balance last few months  GERD (gastroesophageal reflux disease)   Hearing loss   wears hearing aids  Nephrolithiasis   Normal cardiac stress test 09-20-12  Osteoarthritis  Past Surgical History: Past Surgical History: Procedure Laterality Date  CATARACT EXTRACTION  2017  CHOLECYSTECTOMY N/A 02/18/2016  Procedure: LAPAROSCOPIC CHOLECYSTECTOMY WITH POSSIBLE INTRAOPERATIVE CHOLANGIOGRAM;  Surgeon: Mickeal Skinner, MD;  Location: [facility];  Service: General;  Laterality: N/A;  ERCP N/A 02/16/2016  Procedure: ENDOSCOPIC RETROGRADE CHOLANGIOPANCREATOGRAPHY (ERCP);  Surgeon: Teena Irani, MD;  Location: [facility] ENDOSCOPY;   Service: Endoscopy;  Laterality: N/A;  HERNIA REPAIR    wears truss  INGUINAL HERNIA REPAIR Right 06/21/2016  Procedure: OPEN RIGHT INGUINAL HERNIA REPAIR;  Surgeon: [name] Kinsinger, MD;  Location: [facility];  Service: General;  Laterality: Right;  INSERTION OF MESH Right 06/21/2016  Procedure: INSERTION OF MESH;  Surgeon: [name] Kinsinger, MD;  Location: [facility];  Service: General;  Laterality: Right;  MASTOIDECTOMY   HPI: Pt is a 88 year old male with a recent diagnosis of COVID-pneumonia, treated, discharged home then reutrned the following day due to increased lethargy. CXR 10/29: right-sided infiltrate is worsened in the interval. The  left-sided infiltrate is mildly worsened as well. Pt with acute hypoxic respiratory failure, sever sepsis with acute metabolic encephalopahthy due to PNA. PMH: GERD, depression/anxiety  No data recorded  Recommendations for follow up therapy are one component of a multi-disciplinary discharge planning process, led by the attending physician.  Recommendations may be updated based on patient status, additional functional criteria and insurance authorization. Assessment / Plan /  Recommendation   02/08/2022   2:45 PM Clinical Impressions Clinical Impression Pt presents with pharyngeal dysphagia characterized by a pharyngeal delay. This resulted in penetration (PAS 3, 5) of thin and nectar thick liquids before and during the swallow and trace silent aspiration (PAS 8) of penetrated thin liquids after deglutition. Prompted coughing was effective in expelling aspirated material. Laryngeal invasion consistently occured with consecutive swallows only and was eliminated with pt's prompted use of individual swallows of liquids. A regular texture diet with thin liquids is recommended with observance of swallowing precautions and supervision to ensure pt's observance. SLP will continue to follow pt. SLP Visit Diagnosis Dysphagia, pharyngeal phase (R13.13) Impact on safety and function Mild  aspiration risk     02/08/2022   2:45 PM Treatment Recommendations Treatment Recommendations Therapy as outlined in treatment plan below     02/08/2022   2:45 PM Prognosis Prognosis for Safe Diet Advancement Good   02/08/2022   2:45 PM Diet Recommendations SLP Diet Recommendations Regular solids;Thin liquid Medication Administration Whole meds with puree Compensations Slow rate;Small sips/bites     02/08/2022   2:45 PM Other Recommendations Oral Care Recommendations Oral care BID Follow Up Recommendations No SLP follow up Assistance recommended at discharge PRN Functional Status Assessment Patient has had a recent decline in their functional status and demonstrates the ability to make significant improvements in function in a reasonable and predictable amount of time.   02/08/2022   2:45 PM Frequency and Duration  Speech Therapy Frequency (ACUTE ONLY) min 1 x/week Treatment Duration 1 week     02/08/2022   1:35 PM Oral Phase Oral Phase [facility]    02/08/2022   2:45 PM Pharyngeal Phase Pharyngeal Material enters airway, remains ABOVE vocal cords and not ejected out Pharyngeal Material enters airway, remains ABOVE vocal cords and not ejected out Pharyngeal Material enters airway, remains ABOVE vocal cords and not ejected out;Material enters airway, CONTACTS cords and not ejected out;Material enters airway, passes BELOW cords without attempt by patient to eject out (silent aspiration) Pharyngeal Material enters airway, remains ABOVE vocal cords and not ejected out;Material enters airway, CONTACTS cords and not ejected out    02/08/2022   1:35 PM Cervical Esophageal Phase  Cervical Esophageal Phase [facility] Shanika I. Hardin Negus, [place], [place] Office number [phone] Horton Marshall 02/08/2022, 2:46 PM                     MR LUMBAR SPINE WO CONTRAST  Result Date: 02/07/2022 CLINICAL DATA:  Initial evaluation for low back pain. Infection suspected. EXAM: MRI LUMBAR SPINE WITHOUT CONTRAST TECHNIQUE:  Multiplanar, multisequence MR imaging of the lumbar spine was performed. No intravenous contrast was administered. COMPARISON:  Prior radiograph from 08/17/2020. FINDINGS: Segmentation: Standard. Lowest well-formed disc space labeled the L5-S1 level. Alignment: Mild sigmoid scoliosis. 3 mm degenerative retrolisthesis of L1 on L2. Vertebrae: Acute to subacute compression fracture extending through the inferior aspect of L1. Mild 10% central height loss without significant bony retropulsion. Additional compression deformity involving the superior endplate of L4, relatively similar to prior radiograph, and largely chronic in appearance. Vertebral body height otherwise maintained. Bone marrow signal intensity diffusely heterogeneous. No worrisome osseous lesions. Mild discogenic reactive endplate changes noted about the T12-L1 and L5-S1 interspaces. No other abnormal marrow edema. No other findings of acute infection within the lumbar spine. Conus medullaris and cauda equina: Conus extends to the L1 level. Conus and cauda equina appear normal. Paraspinal and other soft tissues: Mild edema  within the right psoas muscle, favored to be reactive due to the adjacent L1 compression fracture. No collections. Aneurysmal dilatation of the infrarenal aorta up to 3.9 cm. Bilateral common iliac artery aneurysms measure 2.5 cm on the right and 2.4 cm on the left. Bladder is partially distended with a Foley catheter in place. Multiple scattered T2 hyperintense cyst noted about the visualized kidneys, benign in appearance, no follow-up imaging recommended. Disc levels: L1-2: Retrolisthesis with mild circumferential disc bulge. Mild facet spurring. No spinal stenosis. Mild to moderate left L1 foraminal narrowing. Right neural foramina remains patent. L2-3: Degenerative intervertebral disc space narrowing with disc desiccation and diffuse disc bulge. Left-sided reactive endplate spurring. Superimposed left foraminal disc extrusion with  slight inferior migration (series 2, image 10). Mild facet and ligament flavum hypertrophy. Resultant mild left greater than right lateral recess stenosis. Mild left L2 foraminal narrowing. Right neural foramen remains patent. L3-4: Disc desiccation with mild diffuse disc bulge. Reactive endplate spurring. Moderate facet and ligament flavum hypertrophy. Resultant moderate spinal stenosis. Mild to moderate right with mild left L3 foraminal narrowing. L4-5: Disc desiccation with mild diffuse disc bulge. Superimposed central disc extrusion with superior migration, eccentric to the right (series 2, image 7). Moderate facet and ligament flavum hypertrophy. Resultant mild canal with moderate bilateral subarticular stenosis. Foramina remain patent. L5-S1: Degenerative intervertebral disc space narrowing with disc desiccation and diffuse disc bulge. Reactive endplate spurring. Mild to moderate facet hypertrophy. No significant spinal stenosis. Mild to moderate bilateral L5 foraminal narrowing. IMPRESSION: 1. Acute to subacute compression fracture involving the inferior aspect of L1 with mild 10% height loss without bony retropulsion. 2. Underlying multilevel degenerative disc disease and facet hypertrophy with resultant mild to moderate spinal stenosis at L2-3 through L4-5, with mild-to-moderate multilevel foraminal narrowing as above. 3. Mild edema within the right psoas muscle, favored to be secondary to the L1 compression fracture, with infection felt to be unlikely. No other evidence for acute infection elsewhere within the lumbar spine. No collections. 4. Aneurysmal dilatation of the infrarenal aorta up to 3.9 cm. Recommend follow-up every 2 years. This recommendation follows ACR consensus guidelines: White Paper of the ACR Incidental Findings Committee II on Vascular Findings. J Am Coll Radiol 2013; 10:789-794. 5. Mild distension of the urinary bladder with a Foley catheter in place. Bedside check to ensure adequate  placement and functioning suggested. Electronically Signed   By: Jeannine Boga M.D.   On: 02/07/2022 04:56   DG Chest Port 1 View  Result Date: 02/04/2022 CLINICAL DATA:  Shortness of breath EXAM: PORTABLE CHEST 1 VIEW COMPARISON:  January 31, 2022 FINDINGS: Opacity in the right lung has mildly worsened in the interval now extending from the base into the mid lung. Haziness over the right base could represent layering effusion. Infiltrate in the left base appears worsened as well. No other interval changes. IMPRESSION: 1. The right-sided infiltrate is worsened in the interval. The left-sided infiltrate is mildly worsened as well. Possible small layering right effusion. No other changes. Electronically Signed   By: Dorise Bullion III M.D.   On: 02/04/2022 07:50   ECHOCARDIOGRAM COMPLETE  Result Date: 02/01/2022    ECHOCARDIOGRAM REPORT   Patient Name:   PERSEUS WESTALL Date of Exam: 02/01/2022 Medical Rec #:  [id number]        Height:       66.0 in Accession #:    [id number]       Weight:       144.6 lb Date of  Birth:  04/24/1919       BSA:          1.742 m Patient Age:    88 years        BP:           141/97 mmHg Patient Gender: M                HR:           44 bpm. Exam Location:  Inpatient Procedure: 2D Echo, Color Doppler and Cardiac Doppler Indications:    Sepsis  History:        Patient has no prior history of Echocardiogram examinations.  Sonographer:    Memory Argue Referring Phys: [id number] [place]  1. Left ventricular ejection fraction, by estimation, is 40 to 45%. The left ventricle has mildly decreased function. The left ventricle demonstrates global hypokinesis. There is moderate asymmetric left ventricular hypertrophy of the septal segment. Left ventricular diastolic parameters are consistent with Grade I diastolic dysfunction (impaired relaxation).  2. Right ventricular systolic function is normal. The right ventricular size is normal.  3. Left atrial size was  mildly dilated.  4. The mitral valve is normal in structure. Mild mitral valve regurgitation. No evidence of mitral stenosis.  5. The aortic valve is calcified. There is moderate calcification of the aortic valve. There is mild thickening of the aortic valve. Aortic valve regurgitation is not visualized. Aortic valve sclerosis/calcification is present, without any evidence of aortic stenosis. Aortic valve area, by VTI measures 2.00 cm. Aortic valve mean gradient measures 8.0 mmHg. Aortic valve Vmax measures 1.96 m/s.  6. The inferior vena cava is normal in size with greater than 50% respiratory variability, suggesting right atrial pressure of 3 mmHg. Comparison(s): No prior Echocardiogram. FINDINGS  Left Ventricle: Left ventricular ejection fraction, by estimation, is 40 to 45%. The left ventricle has mildly decreased function. The left ventricle demonstrates global hypokinesis. The left ventricular internal cavity size was normal in size. There is  moderate asymmetric left ventricular hypertrophy of the septal segment. Left ventricular diastolic parameters are consistent with Grade I diastolic dysfunction (impaired relaxation). Right Ventricle: The right ventricular size is normal. No increase in right ventricular wall thickness. Right ventricular systolic function is normal. Left Atrium: Left atrial size was mildly dilated. Right Atrium: Right atrial size was normal in size. Pericardium: There is no evidence of pericardial effusion. Mitral Valve: The mitral valve is normal in structure. Mild mitral valve regurgitation. No evidence of mitral valve stenosis. Tricuspid Valve: The tricuspid valve is normal in structure. Tricuspid valve regurgitation is mild . No evidence of tricuspid stenosis. Aortic Valve: The aortic valve is calcified. There is moderate calcification of the aortic valve. There is mild thickening of the aortic valve. Aortic valve regurgitation is not visualized. Aortic valve sclerosis/calcification  is present, without any evidence of aortic stenosis. Aortic valve mean gradient measures 8.0 mmHg. Aortic valve peak gradient measures 15.4 mmHg. Aortic valve area, by VTI measures 2.00 cm. Pulmonic Valve: The pulmonic valve was normal in structure. Pulmonic valve regurgitation is not visualized. No evidence of pulmonic stenosis. Aorta: The aortic root is normal in size and structure. Venous: The inferior vena cava is normal in size with greater than 50% respiratory variability, suggesting right atrial pressure of 3 mmHg. IAS/Shunts: No atrial level shunt detected by color flow Doppler.  LEFT VENTRICLE PLAX 2D LVIDd:         3.50 cm      Diastology LVIDs:  2.80 cm      LV e' medial:    5.75 cm/s LV PW:         1.20 cm      LV E/e' medial:  9.3 LV IVS:        1.50 cm      LV e' lateral:   7.30 cm/s LVOT diam:     2.30 cm      LV E/e' lateral: 7.3 LV SV:         79 LV SV Index:   45 LVOT Area:     4.15 cm  LV Volumes (MOD) LV vol d, MOD A2C: 109.0 ml LV vol d, MOD A4C: 108.0 ml LV vol s, MOD A2C: 62.5 ml LV vol s, MOD A4C: 63.9 ml LV SV MOD A2C:     46.5 ml LV SV MOD A4C:     108.0 ml LV SV MOD BP:      43.7 ml RIGHT VENTRICLE TAPSE (M-mode): 1.7 cm LEFT ATRIUM             Index        RIGHT ATRIUM           Index LA diam:        4.60 cm 2.64 cm/m   RA Area:     11.20 cm LA Vol (A2C):   62.7 ml 35.98 ml/m  RA Volume:   22.30 ml  12.80 ml/m LA Vol (A4C):   68.2 ml 39.14 ml/m LA Biplane Vol: 67.1 ml 38.51 ml/m  AORTIC VALVE AV Area (Vmax):    1.80 cm AV Area (Vmean):   1.87 cm AV Area (VTI):     2.00 cm AV Vmax:           196.00 cm/s AV Vmean:          128.000 cm/s AV VTI:            0.395 m AV Peak Grad:      15.4 mmHg AV Mean Grad:      8.0 mmHg LVOT Vmax:         84.80 cm/s LVOT Vmean:        57.700 cm/s LVOT VTI:          0.190 m LVOT/AV VTI ratio: 0.48  AORTA Ao Root diam: 3.80 cm MITRAL VALVE               TRICUSPID VALVE MV Area (PHT): 2.69 cm    TR Peak grad:   14.6 mmHg MV Decel Time: 282  msec    TR Vmax:        191.00 cm/s MV E velocity: 53.60 cm/s MV A velocity: 91.40 cm/s  SHUNTS MV E/A ratio:  0.59        Systemic VTI:  0.19 m                            Systemic Diam: 2.30 cm Godfrey Pick Tobb DO Electronically signed by Berniece Salines DO Signature Date/Time: 02/01/2022/11:11:56 AM    Final    DG Chest Port 1 View  Result Date: 01/31/2022 CLINICAL DATA:  Questionable sepsis. Evaluate for abnormality. Patient was unresponsive. EXAM: PORTABLE CHEST 1 VIEW COMPARISON:  01/28/2022 FINDINGS: Shallow inspiration. Mild cardiac enlargement. Increasing basilar infiltrates in the lungs, more on the right, since prior study. Changes suggest developing pneumonia. Aspiration could also have this appearance. No pleural effusions. No pneumothorax. Mediastinal contours appear intact. Degenerative  changes in the spine and shoulders. IMPRESSION: Cardiac enlargement. Increasing infiltrates in the lung bases, particularly on the right. Electronically Signed   By: Lucienne Capers M.D.   On: 01/31/2022 19:03   DG Chest Portable 1 View  Result Date: 01/28/2022 CLINICAL DATA:  One 88 year old male with history of shortness of breath. EXAM: PORTABLE CHEST 1 VIEW COMPARISON:  Chest x-ray 04/29/2018. FINDINGS: Lung volumes are low. Coarse interstitial markings are again noted throughout the mid to lower lungs bilaterally, suggesting a background of interstitial lung disease. No consolidative airspace disease. No pleural effusions. No pneumothorax. No evidence of pulmonary edema. Heart size is upper limits of normal. Upper mediastinal contours are within normal limits allowing for patient positioning. Atherosclerotic calcifications are noted in the thoracic aorta. IMPRESSION: 1. Low lung volumes with an appearance suggestive of a background of interstitial lung disease. This could be further evaluated with follow-up nonemergent high-resolution chest CT if clinically appropriate. No definite acute findings are noted  on today's examination. 2. Aortic atherosclerosis. Electronically Signed   By: Vinnie Langton M.D.   On: 01/28/2022 08:46    Micro Results    Recent Results (from the past 240 hour(s))  Culture, blood (Routine X 2) w Reflex to ID Panel     Status: Abnormal   Collection Time: 02/07/22  6:00 AM   Specimen: BLOOD  Result Value Ref Range Status   Specimen Description BLOOD LEFT ANTECUBITAL  Final   Special Requests   Final    BOTTLES DRAWN AEROBIC AND ANAEROBIC Blood Culture results may not be optimal due to an inadequate volume of blood received in culture bottles   Culture  Setup Time   Final    GRAM POSITIVE COCCI IN CLUSTERS IN BOTH AEROBIC AND ANAEROBIC BOTTLES CRITICAL VALUE NOTED.  VALUE IS CONSISTENT WITH PREVIOUSLY REPORTED AND CALLED VALUE.    Culture (A)  Final    STAPHYLOCOCCUS AUREUS SUSCEPTIBILITIES PERFORMED ON PREVIOUS CULTURE WITHIN THE LAST 5 DAYS. Performed at [facility], [place] [address], [place], [place] [address]    Report Status 02/10/2022 FINAL  Final  Culture, blood (Routine X 2) w Reflex to ID Panel     Status: Abnormal   Collection Time: 02/07/22  6:03 AM   Specimen: BLOOD LEFT FOREARM  Result Value Ref Range Status   Specimen Description BLOOD LEFT FOREARM  Final   Special Requests   Final    BOTTLES DRAWN AEROBIC AND ANAEROBIC Blood Culture adequate volume   Culture  Setup Time   Final    GRAM POSITIVE COCCI IN CLUSTERS IN BOTH AEROBIC AND ANAEROBIC BOTTLES CRITICAL RESULT CALLED TO, READ BACK BY AND VERIFIED WITH: PHARMD Olevia Bowens [id number] '@1920'$  FH    Culture (A)  Final    STAPHYLOCOCCUS AUREUS SUSCEPTIBILITIES PERFORMED ON PREVIOUS CULTURE WITHIN THE LAST 5 DAYS. Performed at [facility], [place] [address] [place], [place] [address]    Report Status 02/10/2022 FINAL  Final  Culture, blood (Routine X 2) w Reflex to ID Panel     Status: Abnormal   Collection Time: 02/08/22  3:39 PM   Specimen: BLOOD LEFT ARM  Result Value Ref Range  Status   Specimen Description BLOOD LEFT ARM  Final   Special Requests   Final    BOTTLES DRAWN AEROBIC AND ANAEROBIC Blood Culture results may not be optimal due to an inadequate volume of blood received in culture bottles   Culture  Setup Time   Final  GRAM POSITIVE COCCI IN CLUSTERS ANAEROBIC BOTTLE ONLY CRITICAL RESULT CALLED TO, READ BACK BY AND VERIFIED WITH: PHARMD ANDREW MEYER ON 02/09/22 @ 2210 BY DRT    Culture (A)  Final    STAPHYLOCOCCUS AUREUS SUSCEPTIBILITIES PERFORMED ON PREVIOUS CULTURE WITHIN THE LAST 5 DAYS. Performed at [facility], [place] [address] [place], [place] [address]    Report Status 02/11/2022 FINAL  Final  Culture, blood (Routine X 2) w Reflex to ID Panel     Status: None   Collection Time: 02/08/22  4:39 PM   Specimen: BLOOD RIGHT ARM  Result Value Ref Range Status   Specimen Description BLOOD RIGHT ARM  Final   Special Requests   Final    BOTTLES DRAWN AEROBIC AND ANAEROBIC Blood Culture adequate volume   Culture   Final    NO GROWTH 5 DAYS Performed at [facility], [place] [address] [place], [place] [address]    Report Status 02/13/2022 FINAL  Final  Culture, blood (Routine X 2) w Reflex to ID Panel     Status: Abnormal   Collection Time: 02/10/22 10:04 AM   Specimen: BLOOD  Result Value Ref Range Status   Specimen Description BLOOD LEFT ANTECUBITAL  Final   Special Requests   Final    BOTTLES DRAWN AEROBIC AND ANAEROBIC Blood Culture results may not be optimal due to an inadequate volume of blood received in culture bottles   Culture  Setup Time   Final    GRAM POSITIVE COCCI IN CLUSTERS AEROBIC BOTTLE ONLY CRITICAL RESULT CALLED TO, READ BACK BY AND VERIFIED WITH:  PHARMD KAILY,M AT 0902 ON 02/12/2022 BY T.SAAD. CRITICAL VALUE NOTED.  VALUE IS CONSISTENT WITH PREVIOUSLY REPORTED AND CALLED VALUE.    Culture (A)  Final    STAPHYLOCOCCUS AUREUS SUSCEPTIBILITIES PERFORMED ON PREVIOUS CULTURE WITHIN THE LAST 5 DAYS. Performed at  [facility], [place] [address], [place], [place] [address]    Report Status 02/13/2022 FINAL  Final  Culture, blood (Routine X 2) w Reflex to ID Panel     Status: Abnormal   Collection Time: 02/10/22 10:04 AM   Specimen: BLOOD  Result Value Ref Range Status   Specimen Description BLOOD RIGHT ANTECUBITAL  Final   Special Requests   Final    BOTTLES DRAWN AEROBIC AND ANAEROBIC Blood Culture results may not be optimal due to an inadequate volume of blood received in culture bottles   Culture  Setup Time   Final    GRAM POSITIVE COCCI IN CLUSTERS IN BOTH AEROBIC AND ANAEROBIC BOTTLES CRITICAL RESULT CALLED TO, READ BACK BY AND VERIFIED WITH: PHARMD H VON [place] [id number] AT 1441 BY CM Performed at [facility], [place] [address] [place], [place] [address]    Culture METHICILLIN RESISTANT STAPHYLOCOCCUS AUREUS (A)  Final   Report Status 02/14/2022 FINAL  Final   Organism ID, Bacteria METHICILLIN RESISTANT STAPHYLOCOCCUS AUREUS  Final      Susceptibility   Methicillin resistant staphylococcus aureus - MIC*    CIPROFLOXACIN >=8 RESISTANT Resistant     ERYTHROMYCIN >=8 RESISTANT Resistant     GENTAMICIN <=0.5 SENSITIVE Sensitive     OXACILLIN >=4 RESISTANT Resistant     TETRACYCLINE <=1 SENSITIVE Sensitive     VANCOMYCIN 1 SENSITIVE Sensitive     TRIMETH/SULFA <=10 SENSITIVE Sensitive     CLINDAMYCIN >=8 RESISTANT Resistant     RIFAMPIN <=0.5 SENSITIVE Sensitive     Inducible Clindamycin NEGATIVE Sensitive     *  METHICILLIN RESISTANT STAPHYLOCOCCUS AUREUS  Culture, blood (Routine X 2) w Reflex to ID Panel     Status: Abnormal   Collection Time: 02/12/22  4:23 AM   Specimen: BLOOD LEFT ARM  Result Value Ref Range Status   Specimen Description BLOOD LEFT ARM  Final   Special Requests   Final    BOTTLES DRAWN AEROBIC AND ANAEROBIC Blood Culture adequate volume   Culture  Setup Time   Final    GRAM POSITIVE COCCI IN CLUSTERS AEROBIC BOTTLE ONLY CRITICAL RESULT CALLED TO, READ BACK  BY AND VERIFIED WITH: [place] ON 02/13/22 @ 2125 BY DRT    Culture (A)  Final    STAPHYLOCOCCUS AUREUS SUSCEPTIBILITIES PERFORMED ON PREVIOUS CULTURE WITHIN THE LAST 5 DAYS. Performed at [facility], [place] [address], [place], [place] [address]    Report Status 02/15/2022 FINAL  Final  Culture, blood (Routine X 2) w Reflex to ID Panel     Status: None (Preliminary result)   Collection Time: 02/12/22  4:23 AM   Specimen: BLOOD LEFT ARM  Result Value Ref Range Status   Specimen Description BLOOD LEFT ARM  Final   Special Requests   Final    BOTTLES DRAWN AEROBIC AND ANAEROBIC Blood Culture adequate volume   Culture   Final    NO GROWTH 4 DAYS Performed at [facility], [place] [address], [place], [place] [address]    Report Status PENDING  Incomplete    Today   Subjective    Bud Kaeser today has no headache,no chest abdominal pain,no new weakness tingling or numbness, feels much better wants to go home today.    Objective   Blood pressure 129/63, pulse 63, temperature 97.9 F (36.6 C), temperature source Oral, resp. rate 16, height '5\' 6"'$  (1.676 m), weight 65.4 kg, SpO2 97 %.   Intake/Output Summary (Last 24 hours) at 02/16/2022 1015 Last data filed at 02/15/2022 1043 Gross per 24 hour  Intake --  Output 350 ml  Net -350 ml    Exam  Awake Alert, No new F.N deficits,    .AT,PERRAL Supple Neck,   Symmetrical Chest wall movement, Good air movement bilaterally, CTAB RRR,No Gallops,   +ve B.Sounds, Abd Soft, Non tender,  No Cyanosis, Clubbing or edema    Data Review   Recent Labs  Lab 02/11/22 0320 02/12/22 0422 02/13/22 0424 02/14/22 0634 02/15/22 0430  WBC 8.8 10.3 8.6 8.6 10.1  HGB 11.0* 11.2* 10.9* 9.7* 10.0*  HCT 32.3* 32.6* 32.1* 29.3* 30.0*  PLT 145* 162 160 153 162  MCV 91.8 91.1 91.2 92.1 92.9  MCH 31.3 31.3 31.0 30.5 31.0  MCHC 34.1 34.4 34.0 33.1 33.3  RDW 14.2 14.0 14.1 14.1 14.2  LYMPHSABS 1.2 1.3 1.2 1.2 1.5  MONOABS  0.4 0.6 0.7 0.8 0.8  EOSABS 0.0 0.0 0.0 0.0 0.0  BASOSABS 0.0 0.0 0.0 0.0 0.0    Recent Labs  Lab 02/10/22 0229 02/11/22 0320 02/12/22 0422 02/13/22 0424 02/14/22 0634 02/15/22 0430  NA 134* 132* 134* 135 134* 133*  K 4.1 3.9 4.0 4.0 4.0 3.8  CL 103 103 101 107 108 106  CO2 21* 18* 18* 21* 19* 18*  GLUCOSE 89 178* 97 101* 96 97  BUN 21 23 24* '17 18 19  '$ CREATININE 1.27* 1.46* 1.40* 1.35* 1.33* 1.41*  CALCIUM 8.0* 8.1* 8.4* 8.0* 7.8* 7.9*  AST '15 20 17 17 17 19  '$ ALT '19 19 19 19 19 '$ 20  ALKPHOS 40 48 50 52 52 56  BILITOT 0.6 0.5 0.7 0.5 0.6 0.5  ALBUMIN 1.9* 1.8* 1.9* 1.9* 1.8* 1.9*  MG 1.8 1.7 2.0 2.0 1.9  --   CRP 13.0* 13.4* 15.2* 18.4* 18.2*  --   PROCALCITON <0.10 <0.10 <0.10 <0.10 <0.10 <0.10  BNP 209.2* 204.1* 260.5* 198.1* 216.6*  --     Total Time in preparing paper work, data evaluation and todays exam - 35 minutes  Lala Lund M.D on 02/16/2022 at 10:15 AM  [facility]

## 2022-02-17 LAB — CULTURE, BLOOD (ROUTINE X 2)
Culture: NO GROWTH
Special Requests: ADEQUATE

## 2022-02-19 ENCOUNTER — Telehealth (INDEPENDENT_AMBULATORY_CARE_PROVIDER_SITE_OTHER): Payer: Medicare Other | Admitting: Family Medicine

## 2022-02-19 ENCOUNTER — Encounter: Payer: Self-pay | Admitting: Family Medicine

## 2022-02-19 ENCOUNTER — Telehealth: Payer: Self-pay

## 2022-02-19 DIAGNOSIS — B9562 Methicillin resistant Staphylococcus aureus infection as the cause of diseases classified elsewhere: Secondary | ICD-10-CM

## 2022-02-19 DIAGNOSIS — R7881 Bacteremia: Secondary | ICD-10-CM | POA: Diagnosis not present

## 2022-02-19 DIAGNOSIS — A4102 Sepsis due to Methicillin resistant Staphylococcus aureus: Secondary | ICD-10-CM

## 2022-02-19 DIAGNOSIS — N179 Acute kidney failure, unspecified: Secondary | ICD-10-CM

## 2022-02-19 DIAGNOSIS — G9340 Encephalopathy, unspecified: Secondary | ICD-10-CM | POA: Diagnosis not present

## 2022-02-19 DIAGNOSIS — U071 COVID-19: Secondary | ICD-10-CM

## 2022-02-19 DIAGNOSIS — J1282 Pneumonia due to coronavirus disease 2019: Secondary | ICD-10-CM

## 2022-02-19 DIAGNOSIS — K580 Irritable bowel syndrome with diarrhea: Secondary | ICD-10-CM

## 2022-02-19 DIAGNOSIS — E039 Hypothyroidism, unspecified: Secondary | ICD-10-CM

## 2022-02-19 DIAGNOSIS — N401 Enlarged prostate with lower urinary tract symptoms: Secondary | ICD-10-CM | POA: Diagnosis not present

## 2022-02-19 DIAGNOSIS — N138 Other obstructive and reflux uropathy: Secondary | ICD-10-CM

## 2022-02-19 DIAGNOSIS — M159 Polyosteoarthritis, unspecified: Secondary | ICD-10-CM

## 2022-02-19 DIAGNOSIS — J9601 Acute respiratory failure with hypoxia: Secondary | ICD-10-CM

## 2022-02-19 NOTE — Progress Notes (Signed)
Subjective:    Patient ID: Allen Wheeler, male    DOB: 11/01/1919, 88 y.o.   MRN: [id number]  HPI Virtual Visit via Video Note  I connected with the patient on 02/19/22 at  2:45 PM EST by a video enabled telemedicine application and verified that I am speaking with the correct person using two identifiers.  Location patient: home Location provider:work or home office Persons participating in the virtual visit: patient, provider  I discussed the limitations of evaluation and management by telemedicine and the availability of in person appointments. The patient expressed understanding and agreed to proceed.   HPI: Here for a transitional care visit after 2 recent hospital stays. I spoke to the patient and his son. He was first admitted from 01-28-22 to 01-30-22 for a Covid-19 infection. He was treated with 5 days of Molnupiravir and was sent home. However he immediately began to feel more sick with fever, weakness, and mental status changes. He was readmitted on 01-31-22 and stayed until he went home on 02-16-22. He was found to have a RLL pneumonia, and aspiration was suspected to be the source. He was treated with IV antibiotics. Several blood cultures grew MRSA, so he was shown to be septic. His renal function was preserved with IV fluids. Infectious Disease was consulted to help with antibiotic choices. He slowly regained some of his strength, and his mental status returned to baseline. He was sent home with a 30 day supply of Zyvox 600 mg BID. He was entered into a Palliative Care program. His appetite has picked up a little but he is still quite weak. He was also found to have a lumbar vertebral fracture from a fall, and this has been treated with Norco and Voltaren gel. He was sent home with a Foley catheter which seems to be functioning properly. At DC his WBC was stable at 10.1, and his renal function has declined slightly since his admission. The last creatinine was 1.41 and the GFR  was 44.    ROS: See pertinent positives and negatives per HPI.  Past Medical History:  Diagnosis Date   Allergy    Anxiety    Chickenpox    Depression    Dizziness, nonspecific    loss of balance last few months   GERD (gastroesophageal reflux disease)    Hearing loss    wears hearing aids   Nephrolithiasis    Normal cardiac stress test 09-20-12   Osteoarthritis     Past Surgical History:  Procedure Laterality Date   CATARACT EXTRACTION  2017   CHOLECYSTECTOMY N/A 02/18/2016   Procedure: LAPAROSCOPIC CHOLECYSTECTOMY WITH POSSIBLE INTRAOPERATIVE CHOLANGIOGRAM;  Surgeon: Mickeal Skinner, MD;  Location: [facility];  Service: General;  Laterality: N/A;   ERCP N/A 02/16/2016   Procedure: ENDOSCOPIC RETROGRADE CHOLANGIOPANCREATOGRAPHY (ERCP);  Surgeon: Teena Irani, MD;  [facility] ENDOSCOPY;  Service: Endoscopy;  Laterality: N/A;   HERNIA REPAIR     wears truss   INGUINAL HERNIA REPAIR Right 06/21/2016   Procedure: OPEN RIGHT INGUINAL HERNIA REPAIR;  Surgeon: [name] Kinsinger, MD;  Location: [facility];  Service: General;  Laterality: Right;   INSERTION OF MESH Right 06/21/2016   Procedure: INSERTION OF MESH;  Surgeon: [name] Kinsinger, MD;  Location: [facility];  Service: General;  Laterality: Right;   MASTOIDECTOMY      Family History  Problem Relation Age of Onset   Heart disease Mother        Pacemaker   Colon cancer Father  Current Outpatient Medications:    amLODipine (NORVASC) 5 MG tablet, Take 1 tablet (5 mg total) by mouth daily., Disp: 30 tablet, Rfl: 0   diphenoxylate-atropine (LOMOTIL) 2.5-0.025 MG tablet, TAKE ONE OR TWO TABLETS BY MOUTH EVERY MORNING (Patient taking differently: Take 1-2 tablets by mouth daily as needed for diarrhea or loose stools.), Disp: 60 tablet, Rfl: 5   HYDROcodone-acetaminophen (NORCO/VICODIN) 5-325 MG tablet, Take 1 tablet by mouth every 8 (eight) hours as needed for severe pain., Disp: 15 tablet, Rfl: 0   linezolid (ZYVOX) 600 MG  tablet, Take 1 tablet (600 mg total) by mouth 2 (two) times daily., Disp: 60 tablet, Rfl: 0   Multiple Vitamins-Minerals (CENTRUM SILVER ADULT 50+) TABS, Take 1 tablet by mouth in the morning., Disp: , Rfl:    nystatin (MYCOSTATIN/NYSTOP) powder, Apply topically 3 (three) times daily., Disp: 15 g, Rfl: 0   omeprazole (PRILOSEC) 40 MG capsule, TAKE 1 CAPSULE DAILY., Disp: 90 capsule, Rfl: 3   diclofenac Sodium (VOLTAREN) 1 % GEL, Apply 2 g topically to lower back 3 (three) times daily as needed (Patient not taking: Reported on 02/19/2022), Disp: 100 g, Rfl: 0   levothyroxine (SYNTHROID) 50 MCG tablet, TAKE 1 TABLET ONCE DAILY. (Patient not taking: Reported on 02/19/2022), Disp: 90 tablet, Rfl: 3  EXAM:  VITALS per patient if applicable:  GENERAL: alert, oriented, appears well and in no acute distress  HEENT: atraumatic, conjunttiva clear, no obvious abnormalities on inspection of external nose and ears  NECK: normal movements of the head and neck  LUNGS: on inspection no signs of respiratory distress, breathing rate appears normal, no obvious gross SOB, gasping or wheezing  CV: no obvious cyanosis  MS: moves all visible extremities without noticeable abnormality  PSYCH/NEURO: pleasant and cooperative, no obvious depression or anxiety, speech and thought processing grossly intact  ASSESSMENT AND PLAN: He is recovering from a RLL pneumonia and MRSA sepsis. He will finish up the 30 days of Zyvox. Drink plenty of fluids. He is now in a Palliative Care program and we agreed to avoid any aggressive testing or treatments. We will order some PT and OT to help with ambulation and pain control. We will have nursing visits to care for the catheter. At the end of the Zyvox, we agreed that no further blood cultures would be drawn unless he gets worse. We will contact Urology about how long to leave the catheter in place. We spent a total of ( 35  ) minutes reviewing records and discussing these issues.   Alysia Penna, MD  Discussed the following assessment and plan:  No diagnosis found.     I discussed the assessment and treatment plan with the patient. The patient was provided an opportunity to ask questions and all were answered. The patient agreed with the plan and demonstrated an understanding of the instructions.   The patient was advised to call back or seek an in-person evaluation if the symptoms worsen or if the condition fails to improve as anticipated.      Review of Systems     Objective:   Physical Exam        Assessment & Plan:

## 2022-02-19 NOTE — Telephone Encounter (Signed)
Transition Care Management Follow-up Telephone Call Date of discharge and from where: [facility] 02-16-22 Dx: severe sepsis How have you been since you were released from the hospital? Doing ok-  Any questions or concerns? Yes- son is concerned about medications and what the appt 02-19-22 is for   Items Reviewed: Did the pt receive and understand the discharge instructions provided? Yes  Medications obtained and verified? Yes  Other? No  Any new allergies since your discharge? No  Dietary orders reviewed? Yes Do you have support at home? Yes   Home Care and Equipment/Supplies: Were home health services ordered? yes If so, what is the name of the agency? [facility]   Has the agency set up a time to come to the patient's home? yes Were any new equipment or medical supplies ordered?  Yes: back brace  What [facility] the [facility] of the medical supply agency? [facility]  Were you able to get the supplies/equipment? yes Do you have any questions related to the use of the equipment or supplies? No  Functional Questionnaire: (I = Independent and D = Dependent) ADLs: I- WITH ASSISTANCE   Bathing/Dressing- I- WITH ASSISTANCE   Meal Prep- D  Eating- I  Maintaining continence- D-foley catheter  Transferring/Ambulation- I-WALKER   Managing Meds- D- SON MANAGES   Follow up appointments reviewed:  PCP Hospital f/u appt confirmed? Yes  [facility]  [facility]  Are transportation arrangements needed? No  If their condition worsens, is the pt aware to call PCP or go to the Emergency Dept.? Yes Was the patient provided with contact information for the PCP's office or ED? Yes Was to pt encouraged to call back with questions or concerns? Yes   Juanda Crumble LPN [place] Direct Dial [phone]

## 2022-02-20 ENCOUNTER — Other Ambulatory Visit: Payer: Self-pay | Admitting: Family Medicine

## 2022-02-22 ENCOUNTER — Telehealth: Payer: Self-pay | Admitting: Family Medicine

## 2022-02-22 ENCOUNTER — Other Ambulatory Visit: Payer: Self-pay

## 2022-02-22 DIAGNOSIS — U071 COVID-19: Secondary | ICD-10-CM

## 2022-02-22 DIAGNOSIS — B9562 Methicillin resistant Staphylococcus aureus infection as the cause of diseases classified elsewhere: Secondary | ICD-10-CM

## 2022-02-22 DIAGNOSIS — A4102 Sepsis due to Methicillin resistant Staphylococcus aureus: Secondary | ICD-10-CM

## 2022-02-22 NOTE — Telephone Encounter (Signed)
FYI Referral to Home Health was placed, pt son is aware

## 2022-02-22 NOTE — Telephone Encounter (Signed)
[facility] called back and said please tell CMA "Pt and family have no preference" and CMA will know what to do.  Call back [phone] if further info is needed

## 2022-02-22 NOTE — Telephone Encounter (Signed)
[facility] spoke with Allen Wheeler regarding pt, stated that they don't provide any type of Therapy at home, advised that a referral for Home health with any agency like [facility] would go out to pt home. Tammy with [facility] also said that she will call pt son to update him on this. Pt son called the office at 4.30 pm state that they do not have preference for a Home Health agency but they need referral placed for PT/OT/Nursing visits for foley catheter.Referral placed

## 2022-02-22 NOTE — Telephone Encounter (Signed)
[facility] is returning nancy call concerning this pt

## 2022-02-22 NOTE — Telephone Encounter (Signed)
[name] called to FU on the VO for HOP. Was told MD approved orders.  Also, Son stated MD would look into a Urologist that could go evaluate Pt's catheter.   [name] (Son) [phone]

## 2022-02-26 ENCOUNTER — Telehealth: Payer: Self-pay | Admitting: Family Medicine

## 2022-02-26 ENCOUNTER — Other Ambulatory Visit (HOSPITAL_COMMUNITY): Payer: Self-pay

## 2022-02-26 MED ORDER — HYDROCODONE-ACETAMINOPHEN 5-325 MG PO TABS: 1.0000 | ORAL_TABLET | Freq: Three times a day (TID) | ORAL | 0 refills | Status: AC | PRN

## 2022-02-26 NOTE — Telephone Encounter (Signed)
Pt son has been notified.

## 2022-02-26 NOTE — Telephone Encounter (Signed)
I sent in for #15. If he needs to stay on this, we will need to start him on a pain management program

## 2022-02-26 NOTE — Telephone Encounter (Signed)
Pt was released from [facility] on Friday 02/16/22 and was prescribed   Disp Refills Start End   HYDROcodone-acetaminophen (NORCO/VICODIN) 5-325 MG tablet 15 tablet 0 02/16/2022     Pt only has 5 pills left and is still in a lot of pain. Son was wondering if MD could please send a refill to the:  [facility], [place] Phone: [phone]  Fax: [phone]

## 2022-02-27 ENCOUNTER — Telehealth: Payer: Self-pay | Admitting: Family Medicine

## 2022-02-27 NOTE — Telephone Encounter (Signed)
FYI

## 2022-02-27 NOTE — Telephone Encounter (Signed)
Allen Wheeler with [facility] is calling to let md know the patient will be admitted to hospice tomorrow

## 2022-03-09 ENCOUNTER — Telehealth: Payer: Self-pay

## 2022-03-09 NOTE — Telephone Encounter (Signed)
Pt PA sent to plan for review Your information has been submitted to [facility]. To check for an updated outcome later, reopen this PA request from your dashboard.

## 2022-04-09 DEATH — deceased

## 2022-05-07 ENCOUNTER — Telehealth: Payer: Self-pay | Admitting: Family Medicine

## 2022-05-07 NOTE — Telephone Encounter (Signed)
Per [facility]  patient passed away 2022-04-04.
